# Patient Record
Sex: Female | Born: 1935
Health system: Southern US, Community
[De-identification: ages and names within clinical notes are randomized; demographics above are authoritative.]

## PROBLEM LIST (undated history)

## (undated) DIAGNOSIS — Z8543 Personal history of malignant neoplasm of ovary: Secondary | ICD-10-CM

## (undated) DIAGNOSIS — I10 Essential (primary) hypertension: Secondary | ICD-10-CM

## (undated) DIAGNOSIS — H409 Unspecified glaucoma: Secondary | ICD-10-CM

## (undated) DIAGNOSIS — Z923 Personal history of irradiation: Secondary | ICD-10-CM

## (undated) DIAGNOSIS — Z95828 Presence of other vascular implants and grafts: Secondary | ICD-10-CM

## (undated) DIAGNOSIS — Z87898 Personal history of other specified conditions: Secondary | ICD-10-CM

## (undated) DIAGNOSIS — C569 Malignant neoplasm of unspecified ovary: Secondary | ICD-10-CM

## (undated) DIAGNOSIS — E785 Hyperlipidemia, unspecified: Secondary | ICD-10-CM

## (undated) DIAGNOSIS — Z803 Family history of malignant neoplasm of breast: Secondary | ICD-10-CM

## (undated) DIAGNOSIS — N6019 Diffuse cystic mastopathy of unspecified breast: Secondary | ICD-10-CM

## (undated) DIAGNOSIS — Z86010 Personal history of colonic polyps: Secondary | ICD-10-CM

## (undated) DIAGNOSIS — Z8042 Family history of malignant neoplasm of prostate: Secondary | ICD-10-CM

## (undated) DIAGNOSIS — Z8041 Family history of malignant neoplasm of ovary: Secondary | ICD-10-CM

## (undated) DIAGNOSIS — K5732 Diverticulitis of large intestine without perforation or abscess without bleeding: Secondary | ICD-10-CM

## (undated) DIAGNOSIS — A159 Respiratory tuberculosis unspecified: Secondary | ICD-10-CM

## (undated) DIAGNOSIS — C50919 Malignant neoplasm of unspecified site of unspecified female breast: Secondary | ICD-10-CM

## (undated) DIAGNOSIS — Z8049 Family history of malignant neoplasm of other genital organs: Secondary | ICD-10-CM

## (undated) DIAGNOSIS — Z9221 Personal history of antineoplastic chemotherapy: Secondary | ICD-10-CM

## (undated) DIAGNOSIS — N809 Endometriosis, unspecified: Secondary | ICD-10-CM

## (undated) DIAGNOSIS — Z8619 Personal history of other infectious and parasitic diseases: Secondary | ICD-10-CM

## (undated) DIAGNOSIS — J329 Chronic sinusitis, unspecified: Secondary | ICD-10-CM

## (undated) HISTORY — DX: Family history of malignant neoplasm of breast: Z80.3

## (undated) HISTORY — DX: Personal history of other infectious and parasitic diseases: Z86.19

## (undated) HISTORY — DX: Family history of malignant neoplasm of other genital organs: Z80.49

## (undated) HISTORY — DX: Personal history of other specified conditions: Z87.898

## (undated) HISTORY — DX: Essential (primary) hypertension: I10

## (undated) HISTORY — PX: OTHER SURGICAL HISTORY: SHX169

## (undated) HISTORY — PX: OOPHORECTOMY: SHX86

## (undated) HISTORY — DX: Personal history of malignant neoplasm of ovary: Z85.43

## (undated) HISTORY — PX: COLONOSCOPY: SHX174

## (undated) HISTORY — DX: Hyperlipidemia, unspecified: E78.5

## (undated) HISTORY — DX: Diffuse cystic mastopathy of unspecified breast: N60.19

## (undated) HISTORY — DX: Family history of malignant neoplasm of prostate: Z80.42

## (undated) HISTORY — PX: NASAL SINUS SURGERY: SHX719

## (undated) HISTORY — DX: Respiratory tuberculosis unspecified: A15.9

## (undated) HISTORY — DX: Family history of malignant neoplasm of ovary: Z80.41

## (undated) HISTORY — DX: Presence of other vascular implants and grafts: Z95.828

## (undated) HISTORY — DX: Chronic sinusitis, unspecified: J32.9

## (undated) HISTORY — DX: Diverticulitis of large intestine without perforation or abscess without bleeding: K57.32

## (undated) HISTORY — DX: Unspecified glaucoma: H40.9

## (undated) HISTORY — DX: Personal history of colonic polyps: Z86.010

## (undated) HISTORY — DX: Endometriosis, unspecified: N80.9

---
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
## 1980-06-11 HISTORY — PX: BREAST SURGERY: SHX581

## 1991-06-12 HISTORY — PX: BREAST EXCISIONAL BIOPSY: SUR124

## 2000-06-11 DIAGNOSIS — C569 Malignant neoplasm of unspecified ovary: Secondary | ICD-10-CM

## 2000-06-11 HISTORY — DX: Malignant neoplasm of unspecified ovary: C56.9

## 2001-06-11 HISTORY — PX: ABDOMINAL HYSTERECTOMY: SHX81

## 2001-06-11 HISTORY — PX: APPENDECTOMY: SHX54

## 2004-02-02 ENCOUNTER — Other Ambulatory Visit: Payer: Self-pay

## 2004-04-20 ENCOUNTER — Ambulatory Visit: Payer: Self-pay | Admitting: Unknown Physician Specialty

## 2004-04-27 ENCOUNTER — Ambulatory Visit: Payer: Self-pay | Admitting: Unknown Physician Specialty

## 2004-06-02 ENCOUNTER — Ambulatory Visit: Payer: Self-pay | Admitting: Unknown Physician Specialty

## 2004-07-18 ENCOUNTER — Ambulatory Visit: Payer: Self-pay | Admitting: Unknown Physician Specialty

## 2004-10-26 ENCOUNTER — Ambulatory Visit: Payer: Self-pay | Admitting: Unknown Physician Specialty

## 2005-05-30 ENCOUNTER — Ambulatory Visit: Payer: Self-pay | Admitting: Unknown Physician Specialty

## 2006-07-18 ENCOUNTER — Ambulatory Visit: Payer: Self-pay | Admitting: Unknown Physician Specialty

## 2007-07-24 ENCOUNTER — Ambulatory Visit: Payer: Self-pay | Admitting: Unknown Physician Specialty

## 2008-08-10 ENCOUNTER — Ambulatory Visit: Payer: Self-pay | Admitting: Unknown Physician Specialty

## 2009-10-20 ENCOUNTER — Ambulatory Visit: Payer: Self-pay | Admitting: Unknown Physician Specialty

## 2009-11-02 ENCOUNTER — Ambulatory Visit: Payer: Self-pay | Admitting: Unknown Physician Specialty

## 2010-04-24 ENCOUNTER — Ambulatory Visit: Payer: Self-pay | Admitting: Unknown Physician Specialty

## 2010-10-26 ENCOUNTER — Ambulatory Visit: Payer: Self-pay | Admitting: Unknown Physician Specialty

## 2011-11-06 ENCOUNTER — Ambulatory Visit: Payer: Self-pay | Admitting: Family Medicine

## 2012-05-14 ENCOUNTER — Inpatient Hospital Stay: Payer: Self-pay | Admitting: Surgery

## 2012-05-14 ENCOUNTER — Ambulatory Visit: Payer: Self-pay | Admitting: Unknown Physician Specialty

## 2012-05-14 LAB — COMPREHENSIVE METABOLIC PANEL
Albumin: 2.9 g/dL — ABNORMAL LOW (ref 3.4–5.0)
Alkaline Phosphatase: 130 U/L (ref 50–136)
Anion Gap: 6 — ABNORMAL LOW (ref 7–16)
Bilirubin,Total: 0.3 mg/dL (ref 0.2–1.0)
Calcium, Total: 8.7 mg/dL (ref 8.5–10.1)
Co2: 27 mmol/L (ref 21–32)
Creatinine: 0.68 mg/dL (ref 0.60–1.30)
Glucose: 102 mg/dL — ABNORMAL HIGH (ref 65–99)
Osmolality: 271 (ref 275–301)
SGOT(AST): 22 U/L (ref 15–37)
SGPT (ALT): 22 U/L (ref 12–78)

## 2012-05-14 LAB — CBC WITH DIFFERENTIAL/PLATELET
Basophil #: 0 10*3/uL (ref 0.0–0.1)
Eosinophil #: 0.1 10*3/uL (ref 0.0–0.7)
HCT: 33.7 % — ABNORMAL LOW (ref 35.0–47.0)
Lymphocyte #: 2.8 10*3/uL (ref 1.0–3.6)
MCH: 31.6 pg (ref 26.0–34.0)
MCV: 90 fL (ref 80–100)
Monocyte #: 1.9 x10 3/mm — ABNORMAL HIGH (ref 0.2–0.9)
Monocyte %: 9.5 %
Platelet: 369 10*3/uL (ref 150–440)
RBC: 3.73 10*6/uL — ABNORMAL LOW (ref 3.80–5.20)
RDW: 13.6 % (ref 11.5–14.5)
WBC: 19.9 10*3/uL — ABNORMAL HIGH (ref 3.6–11.0)

## 2012-05-14 LAB — URINALYSIS, COMPLETE
Bilirubin,UR: NEGATIVE
Glucose,UR: NEGATIVE mg/dL (ref 0–75)
Ketone: NEGATIVE
Leukocyte Esterase: NEGATIVE
Specific Gravity: 1.053 (ref 1.003–1.030)
WBC UR: <1 /HPF (ref 0–5)

## 2012-05-15 LAB — CBC WITH DIFFERENTIAL/PLATELET
Basophil #: 0.1 10*3/uL (ref 0.0–0.1)
Basophil #: 0.1 10*3/uL (ref 0.0–0.1)
Eosinophil #: 0.1 10*3/uL (ref 0.0–0.7)
Eosinophil %: 0.3 %
HCT: 32 % — ABNORMAL LOW (ref 35.0–47.0)
HGB: 11.4 g/dL — ABNORMAL LOW (ref 12.0–16.0)
Lymphocyte #: 2 10*3/uL (ref 1.0–3.6)
Lymphocyte #: 2.7 10*3/uL (ref 1.0–3.6)
Lymphocyte %: 10.5 %
MCH: 30.7 pg (ref 26.0–34.0)
MCH: 30.6 pg (ref 26.0–34.0)
MCHC: 33.9 g/dL (ref 32.0–36.0)
MCV: 91 fL (ref 80–100)
Monocyte #: 1.8 x10 3/mm — ABNORMAL HIGH (ref 0.2–0.9)
Monocyte #: 2.1 x10 3/mm — ABNORMAL HIGH (ref 0.2–0.9)
Monocyte %: 10.2 %
Monocyte %: 8.4 %
Neutrophil #: 14.1 10*3/uL — ABNORMAL HIGH (ref 1.4–6.5)
Neutrophil %: 78.1 %
Neutrophil %: 80.4 %
Platelet: 330 10*3/uL (ref 150–440)
Platelet: 374 10*3/uL (ref 150–440)
RBC: 3.54 10*6/uL — ABNORMAL LOW (ref 3.80–5.20)
RBC: 3.74 10*6/uL — ABNORMAL LOW (ref 3.80–5.20)
RDW: 13.5 % (ref 11.5–14.5)
RDW: 14.1 % (ref 11.5–14.5)
WBC: 18 10*3/uL — ABNORMAL HIGH (ref 3.6–11.0)
WBC: 25.3 10*3/uL — ABNORMAL HIGH (ref 3.6–11.0)

## 2012-05-15 LAB — BASIC METABOLIC PANEL
BUN: 7 mg/dL (ref 7–18)
Calcium, Total: 8.3 mg/dL — ABNORMAL LOW (ref 8.5–10.1)
Co2: 25 mmol/L (ref 21–32)
EGFR (African American): >60
EGFR (Non-African Amer.): >60
Glucose: 96 mg/dL (ref 65–99)
Osmolality: 277 (ref 275–301)
Potassium: 3.7 mmol/L (ref 3.5–5.1)
Sodium: 140 mmol/L (ref 136–145)

## 2012-05-16 LAB — CBC WITH DIFFERENTIAL/PLATELET
Eosinophil #: 0.1 10*3/uL (ref 0.0–0.7)
HGB: 11 g/dL — ABNORMAL LOW (ref 12.0–16.0)
Lymphocyte #: 3.1 10*3/uL (ref 1.0–3.6)
MCH: 29.5 pg (ref 26.0–34.0)
MCV: 90 fL (ref 80–100)
Monocyte #: 1.8 x10 3/mm — ABNORMAL HIGH (ref 0.2–0.9)
Monocyte %: 8 %
Neutrophil %: 76.8 %
Platelet: 409 10*3/uL (ref 150–440)
RBC: 3.74 10*6/uL — ABNORMAL LOW (ref 3.80–5.20)
RDW: 13.9 % (ref 11.5–14.5)
WBC: 21.9 10*3/uL — ABNORMAL HIGH (ref 3.6–11.0)

## 2012-05-17 LAB — CBC WITH DIFFERENTIAL/PLATELET
Basophil %: 0.8 %
Eosinophil #: 0.1 10*3/uL (ref 0.0–0.7)
Eosinophil %: 0.7 %
HGB: 10.6 g/dL — ABNORMAL LOW (ref 12.0–16.0)
Lymphocyte #: 2.5 10*3/uL (ref 1.0–3.6)
Lymphocyte %: 12.4 %
MCH: 30.8 pg (ref 26.0–34.0)
MCHC: 34.3 g/dL (ref 32.0–36.0)
MCV: 90 fL (ref 80–100)
Monocyte #: 1.9 x10 3/mm — ABNORMAL HIGH (ref 0.2–0.9)
Neutrophil #: 15.5 10*3/uL — ABNORMAL HIGH (ref 1.4–6.5)
RDW: 14 % (ref 11.5–14.5)

## 2012-05-18 LAB — CBC WITH DIFFERENTIAL/PLATELET
Eosinophil %: 1 %
HCT: 31.5 % — ABNORMAL LOW (ref 35.0–47.0)
HGB: 10.5 g/dL — ABNORMAL LOW (ref 12.0–16.0)
Lymphocyte #: 2.6 10*3/uL (ref 1.0–3.6)
MCHC: 33.2 g/dL (ref 32.0–36.0)
MCV: 90 fL (ref 80–100)
Monocyte #: 1.3 x10 3/mm — ABNORMAL HIGH (ref 0.2–0.9)
Monocyte %: 7.4 %
Neutrophil #: 13.2 10*3/uL — ABNORMAL HIGH (ref 1.4–6.5)
Platelet: 402 10*3/uL (ref 150–440)
RBC: 3.51 10*6/uL — ABNORMAL LOW (ref 3.80–5.20)
WBC: 17.3 10*3/uL — ABNORMAL HIGH (ref 3.6–11.0)

## 2012-05-19 LAB — CBC WITH DIFFERENTIAL/PLATELET
Basophil #: 0.1 10*3/uL (ref 0.0–0.1)
Basophil %: 0.3 %
Eosinophil #: 0.4 10*3/uL (ref 0.0–0.7)
HCT: 32.9 % — ABNORMAL LOW (ref 35.0–47.0)
Lymphocyte %: 18.6 %
MCH: 32.5 pg (ref 26.0–34.0)
MCHC: 36 g/dL (ref 32.0–36.0)
MCV: 91 fL (ref 80–100)
Monocyte #: 1.5 x10 3/mm — ABNORMAL HIGH (ref 0.2–0.9)
Neutrophil #: 13 10*3/uL — ABNORMAL HIGH (ref 1.4–6.5)
Neutrophil %: 70.9 %
Platelet: 443 10*3/uL — ABNORMAL HIGH (ref 150–440)
RBC: 3.63 10*6/uL — ABNORMAL LOW (ref 3.80–5.20)

## 2012-05-20 LAB — CBC WITH DIFFERENTIAL/PLATELET
Basophil #: 0.1 10*3/uL (ref 0.0–0.1)
Basophil %: 0.5 %
Eosinophil #: 0.3 10*3/uL (ref 0.0–0.7)
HCT: 30.6 % — ABNORMAL LOW (ref 35.0–47.0)
HGB: 10.3 g/dL — ABNORMAL LOW (ref 12.0–16.0)
Lymphocyte %: 16.6 %
MCHC: 33.7 g/dL (ref 32.0–36.0)
MCV: 90 fL (ref 80–100)
Monocyte #: 1.4 x10 3/mm — ABNORMAL HIGH (ref 0.2–0.9)
Monocyte %: 8.3 %
Neutrophil %: 73.1 %
RDW: 13.9 % (ref 11.5–14.5)
WBC: 16.9 10*3/uL — ABNORMAL HIGH (ref 3.6–11.0)

## 2012-05-20 LAB — BASIC METABOLIC PANEL
Anion Gap: 8 (ref 7–16)
BUN: 10 mg/dL (ref 7–18)
Chloride: 107 mmol/L (ref 98–107)
Co2: 25 mmol/L (ref 21–32)
Creatinine: 0.74 mg/dL (ref 0.60–1.30)
EGFR (African American): >60
Glucose: 95 mg/dL (ref 65–99)
Sodium: 140 mmol/L (ref 136–145)

## 2012-05-20 LAB — URINALYSIS, COMPLETE
Bilirubin,UR: NEGATIVE
Glucose,UR: NEGATIVE mg/dL (ref 0–75)
Ketone: NEGATIVE
Nitrite: NEGATIVE
Ph: 7 (ref 4.5–8.0)
Squamous Epithelial: 2

## 2012-05-21 LAB — CBC WITH DIFFERENTIAL/PLATELET
Basophil #: 0.1 10*3/uL (ref 0.0–0.1)
Eosinophil #: 0.2 10*3/uL (ref 0.0–0.7)
HCT: 32 % — ABNORMAL LOW (ref 35.0–47.0)
Lymphocyte %: 16 %
MCH: 30.9 pg (ref 26.0–34.0)
MCV: 90 fL (ref 80–100)
Monocyte #: 1.5 x10 3/mm — ABNORMAL HIGH (ref 0.2–0.9)
Monocyte %: 8.6 %
Platelet: 495 10*3/uL — ABNORMAL HIGH (ref 150–440)
RDW: 13.9 % (ref 11.5–14.5)
WBC: 17 10*3/uL — ABNORMAL HIGH (ref 3.6–11.0)

## 2012-05-21 LAB — PROTIME-INR: INR: 1

## 2012-05-21 LAB — BASIC METABOLIC PANEL
Anion Gap: 6 — ABNORMAL LOW (ref 7–16)
BUN: 8 mg/dL (ref 7–18)
Creatinine: 0.76 mg/dL (ref 0.60–1.30)
EGFR (African American): >60
EGFR (Non-African Amer.): >60
Glucose: 97 mg/dL (ref 65–99)

## 2012-05-22 HISTORY — PX: OSTOMY: SHX5997

## 2012-05-22 LAB — CBC WITH DIFFERENTIAL/PLATELET
Basophil %: 0.3 %
Eosinophil #: 0.1 10*3/uL (ref 0.0–0.7)
HCT: 32.7 % — ABNORMAL LOW (ref 35.0–47.0)
HGB: 10.8 g/dL — ABNORMAL LOW (ref 12.0–16.0)
MCH: 29.7 pg (ref 26.0–34.0)
MCHC: 33.1 g/dL (ref 32.0–36.0)
Monocyte #: 1.5 x10 3/mm — ABNORMAL HIGH (ref 0.2–0.9)
Neutrophil #: 16.6 10*3/uL — ABNORMAL HIGH (ref 1.4–6.5)
Neutrophil %: 82.4 %
Platelet: 472 10*3/uL — ABNORMAL HIGH (ref 150–440)

## 2012-05-22 LAB — BASIC METABOLIC PANEL
Anion Gap: 7 (ref 7–16)
BUN: 7 mg/dL (ref 7–18)
Creatinine: 0.75 mg/dL (ref 0.60–1.30)
EGFR (Non-African Amer.): >60
Glucose: 106 mg/dL — ABNORMAL HIGH (ref 65–99)
Potassium: 3.7 mmol/L (ref 3.5–5.1)
Sodium: 140 mmol/L (ref 136–145)

## 2012-05-22 LAB — HEMOGLOBIN: HGB: 10.8 g/dL — ABNORMAL LOW (ref 12.0–16.0)

## 2012-05-23 LAB — BASIC METABOLIC PANEL
BUN: 7 mg/dL (ref 7–18)
Calcium, Total: 7.8 mg/dL — ABNORMAL LOW (ref 8.5–10.1)
Chloride: 109 mmol/L — ABNORMAL HIGH (ref 98–107)
Creatinine: 0.82 mg/dL (ref 0.60–1.30)
EGFR (Non-African Amer.): >60
Glucose: 182 mg/dL — ABNORMAL HIGH (ref 65–99)
Osmolality: 286 (ref 275–301)
Potassium: 3.7 mmol/L (ref 3.5–5.1)
Sodium: 142 mmol/L (ref 136–145)

## 2012-05-23 LAB — CBC WITH DIFFERENTIAL/PLATELET
Basophil #: 0 10*3/uL (ref 0.0–0.1)
Eosinophil #: 0 10*3/uL (ref 0.0–0.7)
Eosinophil %: 0 %
HGB: 10.7 g/dL — ABNORMAL LOW (ref 12.0–16.0)
Lymphocyte #: 1.8 10*3/uL (ref 1.0–3.6)
MCH: 29.9 pg (ref 26.0–34.0)
MCV: 91 fL (ref 80–100)
Monocyte #: 1.9 x10 3/mm — ABNORMAL HIGH (ref 0.2–0.9)
Monocyte %: 6.4 %
Neutrophil %: 87.3 %
Platelet: 530 10*3/uL — ABNORMAL HIGH (ref 150–440)
RBC: 3.57 10*6/uL — ABNORMAL LOW (ref 3.80–5.20)
RDW: 14 % (ref 11.5–14.5)
WBC: 29.4 10*3/uL — ABNORMAL HIGH (ref 3.6–11.0)

## 2012-05-24 LAB — BASIC METABOLIC PANEL
Anion Gap: 6 — ABNORMAL LOW (ref 7–16)
Chloride: 105 mmol/L (ref 98–107)
Co2: 27 mmol/L (ref 21–32)
Creatinine: 0.59 mg/dL — ABNORMAL LOW (ref 0.60–1.30)
Glucose: 127 mg/dL — ABNORMAL HIGH (ref 65–99)
Potassium: 3.8 mmol/L (ref 3.5–5.1)
Sodium: 138 mmol/L (ref 136–145)

## 2012-05-24 LAB — CBC WITH DIFFERENTIAL/PLATELET
Basophil #: 0.1 10*3/uL (ref 0.0–0.1)
Basophil %: 0.5 %
Eosinophil #: 0 10*3/uL (ref 0.0–0.7)
HCT: 27.7 % — ABNORMAL LOW (ref 35.0–47.0)
Lymphocyte #: 2.1 10*3/uL (ref 1.0–3.6)
Lymphocyte %: 8.5 %
MCH: 32.1 pg (ref 26.0–34.0)
MCV: 90 fL (ref 80–100)
Monocyte #: 2.3 x10 3/mm — ABNORMAL HIGH (ref 0.2–0.9)
Neutrophil #: 20.4 10*3/uL — ABNORMAL HIGH (ref 1.4–6.5)
Neutrophil %: 81.8 %
RBC: 3.09 10*6/uL — ABNORMAL LOW (ref 3.80–5.20)
RDW: 14.3 % (ref 11.5–14.5)
WBC: 24.9 10*3/uL — ABNORMAL HIGH (ref 3.6–11.0)

## 2012-05-25 LAB — CBC WITH DIFFERENTIAL/PLATELET
Basophil %: 0.3 %
Eosinophil %: 0.3 %
HCT: 28.1 % — ABNORMAL LOW (ref 35.0–47.0)
HGB: 10.3 g/dL — ABNORMAL LOW (ref 12.0–16.0)
Lymphocyte %: 8.7 %
MCH: 32.9 pg (ref 26.0–34.0)
MCV: 90 fL (ref 80–100)
Monocyte #: 1.7 x10 3/mm — ABNORMAL HIGH (ref 0.2–0.9)
Monocyte %: 8.1 %
Platelet: 468 10*3/uL — ABNORMAL HIGH (ref 150–440)
RBC: 3.13 10*6/uL — ABNORMAL LOW (ref 3.80–5.20)
WBC: 20.7 10*3/uL — ABNORMAL HIGH (ref 3.6–11.0)

## 2012-05-26 LAB — CBC WITH DIFFERENTIAL/PLATELET
Basophil #: 0.1 10*3/uL (ref 0.0–0.1)
Basophil %: 0.4 %
Eosinophil %: 0.8 %
HCT: 27.9 % — ABNORMAL LOW (ref 35.0–47.0)
HGB: 9.4 g/dL — ABNORMAL LOW (ref 12.0–16.0)
Lymphocyte #: 2.1 10*3/uL (ref 1.0–3.6)
MCH: 30.2 pg (ref 26.0–34.0)
MCV: 89 fL (ref 80–100)
Monocyte #: 1.2 x10 3/mm — ABNORMAL HIGH (ref 0.2–0.9)
Neutrophil #: 11.2 10*3/uL — ABNORMAL HIGH (ref 1.4–6.5)
Neutrophil %: 76.2 %
Platelet: 505 10*3/uL — ABNORMAL HIGH (ref 150–440)
RBC: 3.13 10*6/uL — ABNORMAL LOW (ref 3.80–5.20)

## 2012-05-26 LAB — WOUND CULTURE

## 2012-05-26 LAB — BASIC METABOLIC PANEL
BUN: 4 mg/dL — ABNORMAL LOW (ref 7–18)
Calcium, Total: 8.6 mg/dL (ref 8.5–10.1)
Chloride: 107 mmol/L (ref 98–107)
Co2: 28 mmol/L (ref 21–32)
Creatinine: 0.46 mg/dL — ABNORMAL LOW (ref 0.60–1.30)
EGFR (African American): >60
Osmolality: 278 (ref 275–301)
Potassium: 3.6 mmol/L (ref 3.5–5.1)

## 2012-05-26 LAB — PATHOLOGY REPORT

## 2012-05-27 LAB — CBC WITH DIFFERENTIAL/PLATELET
Basophil %: 0.7 %
Eosinophil #: 0.1 10*3/uL (ref 0.0–0.7)
Eosinophil %: 0.8 %
HGB: 10.1 g/dL — ABNORMAL LOW (ref 12.0–16.0)
Lymphocyte %: 17.3 %
MCHC: 36 g/dL (ref 32.0–36.0)
Monocyte %: 9 %
Neutrophil %: 72.2 %
RBC: 3.13 10*6/uL — ABNORMAL LOW (ref 3.80–5.20)
WBC: 15.2 10*3/uL — ABNORMAL HIGH (ref 3.6–11.0)

## 2012-05-28 LAB — CBC WITH DIFFERENTIAL/PLATELET
Eosinophil #: 0.2 10*3/uL (ref 0.0–0.7)
Lymphocyte #: 1.9 10*3/uL (ref 1.0–3.6)
MCH: 30.6 pg (ref 26.0–34.0)
MCHC: 34.2 g/dL (ref 32.0–36.0)
MCV: 90 fL (ref 80–100)
Monocyte #: 1.1 x10 3/mm — ABNORMAL HIGH (ref 0.2–0.9)
Monocyte %: 8.6 %
Neutrophil %: 74.2 %
Platelet: 485 10*3/uL — ABNORMAL HIGH (ref 150–440)
RDW: 14.1 % (ref 11.5–14.5)

## 2012-05-29 LAB — CBC WITH DIFFERENTIAL/PLATELET
Basophil #: 0.1 10*3/uL (ref 0.0–0.1)
Basophil %: 0.6 %
Eosinophil #: 0.3 10*3/uL (ref 0.0–0.7)
Eosinophil %: 2.2 %
HGB: 9.5 g/dL — ABNORMAL LOW (ref 12.0–16.0)
Lymphocyte #: 2.3 10*3/uL (ref 1.0–3.6)
MCH: 30 pg (ref 26.0–34.0)
MCHC: 33.7 g/dL (ref 32.0–36.0)
MCV: 89 fL (ref 80–100)
Monocyte #: 1.3 x10 3/mm — ABNORMAL HIGH (ref 0.2–0.9)
Neutrophil %: 72.3 %
Platelet: 570 10*3/uL — ABNORMAL HIGH (ref 150–440)
RBC: 3.15 10*6/uL — ABNORMAL LOW (ref 3.80–5.20)

## 2012-05-30 LAB — CBC WITH DIFFERENTIAL/PLATELET
Basophil #: 0.1 10*3/uL (ref 0.0–0.1)
Eosinophil #: 0.3 10*3/uL (ref 0.0–0.7)
HCT: 29.7 % — ABNORMAL LOW (ref 35.0–47.0)
Lymphocyte #: 1.7 10*3/uL (ref 1.0–3.6)
Lymphocyte %: 9.2 %
MCH: 29.4 pg (ref 26.0–34.0)
MCHC: 32.9 g/dL (ref 32.0–36.0)
MCV: 89 fL (ref 80–100)
Monocyte #: 1.4 x10 3/mm — ABNORMAL HIGH (ref 0.2–0.9)
Neutrophil #: 14.5 10*3/uL — ABNORMAL HIGH (ref 1.4–6.5)
RBC: 3.32 10*6/uL — ABNORMAL LOW (ref 3.80–5.20)
RDW: 14.7 % — ABNORMAL HIGH (ref 11.5–14.5)

## 2012-05-31 LAB — CBC WITH DIFFERENTIAL/PLATELET
Basophil #: 0.1 10*3/uL (ref 0.0–0.1)
Eosinophil #: 0.2 10*3/uL (ref 0.0–0.7)
Eosinophil %: 1.5 %
HCT: 27.3 % — ABNORMAL LOW (ref 35.0–47.0)
Lymphocyte #: 1.8 10*3/uL (ref 1.0–3.6)
MCH: 29.7 pg (ref 26.0–34.0)
MCHC: 33.2 g/dL (ref 32.0–36.0)
MCV: 90 fL (ref 80–100)
Monocyte #: 1.6 x10 3/mm — ABNORMAL HIGH (ref 0.2–0.9)
Neutrophil #: 9.6 10*3/uL — ABNORMAL HIGH (ref 1.4–6.5)
Neutrophil %: 72.2 %
Platelet: 571 10*3/uL — ABNORMAL HIGH (ref 150–440)
RBC: 3.05 10*6/uL — ABNORMAL LOW (ref 3.80–5.20)
RDW: 14.7 % — ABNORMAL HIGH (ref 11.5–14.5)
WBC: 13.2 10*3/uL — ABNORMAL HIGH (ref 3.6–11.0)

## 2012-05-31 LAB — COMPREHENSIVE METABOLIC PANEL
Alkaline Phosphatase: 177 U/L — ABNORMAL HIGH (ref 50–136)
BUN: 9 mg/dL (ref 7–18)
Bilirubin,Total: 0.3 mg/dL (ref 0.2–1.0)
Calcium, Total: 8.2 mg/dL — ABNORMAL LOW (ref 8.5–10.1)
Co2: 28 mmol/L (ref 21–32)
Creatinine: 0.84 mg/dL (ref 0.60–1.30)
EGFR (African American): >60
EGFR (Non-African Amer.): >60
Glucose: 131 mg/dL — ABNORMAL HIGH (ref 65–99)
Osmolality: 276 (ref 275–301)
SGPT (ALT): 50 U/L (ref 12–78)
Sodium: 138 mmol/L (ref 136–145)
Total Protein: 6.5 g/dL (ref 6.4–8.2)

## 2012-05-31 LAB — CHOLESTEROL, TOTAL: Cholesterol: 144 mg/dL (ref 0–200)

## 2012-05-31 LAB — TRIGLYCERIDES: Triglycerides: 178 mg/dL (ref 0–200)

## 2012-06-01 LAB — CBC WITH DIFFERENTIAL/PLATELET
Basophil %: 1.2 %
Eosinophil %: 1.6 %
HCT: 26.8 % — ABNORMAL LOW (ref 35.0–47.0)
HGB: 8.5 g/dL — ABNORMAL LOW (ref 12.0–16.0)
Lymphocyte #: 1.7 10*3/uL (ref 1.0–3.6)
Lymphocyte %: 12.4 %
MCH: 28.4 pg (ref 26.0–34.0)
MCV: 89 fL (ref 80–100)
Monocyte #: 1.7 x10 3/mm — ABNORMAL HIGH (ref 0.2–0.9)
Monocyte %: 11.8 %
Platelet: 551 10*3/uL — ABNORMAL HIGH (ref 150–440)
RBC: 3.01 10*6/uL — ABNORMAL LOW (ref 3.80–5.20)
WBC: 14 10*3/uL — ABNORMAL HIGH (ref 3.6–11.0)

## 2012-06-01 LAB — PHOSPHORUS: Phosphorus: 3.3 mg/dL (ref 2.5–4.9)

## 2012-06-01 LAB — CALCIUM: Calcium, Total: 8.5 mg/dL (ref 8.5–10.1)

## 2012-06-01 LAB — POTASSIUM: Potassium: 3.5 mmol/L (ref 3.5–5.1)

## 2012-06-01 LAB — SODIUM: Sodium: 141 mmol/L (ref 136–145)

## 2012-06-02 LAB — CBC WITH DIFFERENTIAL/PLATELET
Basophil %: 0.7 %
Eosinophil %: 2.4 %
HGB: 8.7 g/dL — ABNORMAL LOW (ref 12.0–16.0)
Lymphocyte #: 2.6 10*3/uL (ref 1.0–3.6)
MCH: 29.5 pg (ref 26.0–34.0)
MCV: 90 fL (ref 80–100)
Monocyte #: 1.5 x10 3/mm — ABNORMAL HIGH (ref 0.2–0.9)
Monocyte %: 12.8 %
Neutrophil #: 7.6 10*3/uL — ABNORMAL HIGH (ref 1.4–6.5)
Platelet: 552 10*3/uL — ABNORMAL HIGH (ref 150–440)
RBC: 2.95 10*6/uL — ABNORMAL LOW (ref 3.80–5.20)
WBC: 12.1 10*3/uL — ABNORMAL HIGH (ref 3.6–11.0)

## 2012-06-02 LAB — SODIUM: Sodium: 141 mmol/L (ref 136–145)

## 2012-06-02 LAB — POTASSIUM: Potassium: 3.6 mmol/L (ref 3.5–5.1)

## 2012-06-02 LAB — MAGNESIUM: Magnesium: 2 mg/dL

## 2012-06-03 LAB — MAGNESIUM: Magnesium: 2 mg/dL

## 2012-06-03 LAB — SODIUM: Sodium: 140 mmol/L (ref 136–145)

## 2012-06-03 LAB — POTASSIUM: Potassium: 3.5 mmol/L (ref 3.5–5.1)

## 2012-06-03 LAB — CALCIUM: Calcium, Total: 8.7 mg/dL (ref 8.5–10.1)

## 2012-06-04 LAB — CBC WITH DIFFERENTIAL/PLATELET
Basophil #: 0.1 10*3/uL (ref 0.0–0.1)
Lymphocyte #: 2.1 10*3/uL (ref 1.0–3.6)
MCH: 29.9 pg (ref 26.0–34.0)
MCV: 89 fL (ref 80–100)
Monocyte #: 1 x10 3/mm — ABNORMAL HIGH (ref 0.2–0.9)
Platelet: 516 10*3/uL — ABNORMAL HIGH (ref 150–440)
RDW: 14.2 % (ref 11.5–14.5)
WBC: 9.6 10*3/uL (ref 3.6–11.0)

## 2012-06-04 LAB — PHOSPHORUS: Phosphorus: 3.4 mg/dL (ref 2.5–4.9)

## 2012-06-04 LAB — MAGNESIUM: Magnesium: 1.9 mg/dL

## 2012-06-04 LAB — SODIUM: Sodium: 143 mmol/L (ref 136–145)

## 2012-06-05 LAB — CALCIUM: Calcium, Total: 8.5 mg/dL (ref 8.5–10.1)

## 2012-06-05 LAB — CBC WITH DIFFERENTIAL/PLATELET
Basophil #: 0.1 10*3/uL (ref 0.0–0.1)
Eosinophil #: 0.3 10*3/uL (ref 0.0–0.7)
HCT: 27.6 % — ABNORMAL LOW (ref 35.0–47.0)
Lymphocyte #: 2.5 10*3/uL (ref 1.0–3.6)
Lymphocyte %: 24.8 %
MCHC: 33.3 g/dL (ref 32.0–36.0)
MCV: 90 fL (ref 80–100)
Monocyte %: 10.5 %
Neutrophil #: 6.1 10*3/uL (ref 1.4–6.5)
Neutrophil %: 61 %
Platelet: 506 10*3/uL — ABNORMAL HIGH (ref 150–440)
RDW: 14.5 % (ref 11.5–14.5)
WBC: 10 10*3/uL (ref 3.6–11.0)

## 2012-06-05 LAB — PHOSPHORUS: Phosphorus: 2.9 mg/dL (ref 2.5–4.9)

## 2012-06-05 LAB — POTASSIUM: Potassium: 3.9 mmol/L (ref 3.5–5.1)

## 2012-06-05 LAB — MAGNESIUM: Magnesium: 2 mg/dL

## 2012-06-06 LAB — CBC WITH DIFFERENTIAL/PLATELET
Eosinophil %: 3.4 %
HCT: 28 % — ABNORMAL LOW (ref 35.0–47.0)
Lymphocyte #: 2.2 10*3/uL (ref 1.0–3.6)
MCV: 90 fL (ref 80–100)
Monocyte %: 11.9 %
Neutrophil #: 5.3 10*3/uL (ref 1.4–6.5)
Platelet: 496 10*3/uL — ABNORMAL HIGH (ref 150–440)
RBC: 3.1 10*6/uL — ABNORMAL LOW (ref 3.80–5.20)

## 2012-06-06 LAB — SODIUM: Sodium: 143 mmol/L (ref 136–145)

## 2012-06-06 LAB — POTASSIUM: Potassium: 3.7 mmol/L (ref 3.5–5.1)

## 2012-06-06 LAB — PHOSPHORUS: Phosphorus: 3.1 mg/dL (ref 2.5–4.9)

## 2012-06-06 LAB — CREATININE, SERUM
Creatinine: 0.59 mg/dL — ABNORMAL LOW (ref 0.60–1.30)
EGFR (African American): >60
EGFR (Non-African Amer.): >60

## 2012-06-06 LAB — CALCIUM: Calcium, Total: 8.6 mg/dL (ref 8.5–10.1)

## 2012-06-07 LAB — POTASSIUM: Potassium: 3.9 mmol/L (ref 3.5–5.1)

## 2012-06-07 LAB — SODIUM: Sodium: 142 mmol/L (ref 136–145)

## 2012-06-07 LAB — MAGNESIUM: Magnesium: 2.1 mg/dL

## 2012-06-07 LAB — PHOSPHORUS: Phosphorus: 4.1 mg/dL (ref 2.5–4.9)

## 2012-06-08 LAB — POTASSIUM: Potassium: 4.1 mmol/L (ref 3.5–5.1)

## 2012-06-08 LAB — SODIUM: Sodium: 140 mmol/L (ref 136–145)

## 2012-06-08 LAB — MAGNESIUM: Magnesium: 2.3 mg/dL

## 2012-06-08 LAB — PHOSPHORUS: Phosphorus: 4 mg/dL (ref 2.5–4.9)

## 2012-06-11 DIAGNOSIS — Z860100 Personal history of colon polyps, unspecified: Secondary | ICD-10-CM

## 2012-06-11 HISTORY — DX: Personal history of colon polyps, unspecified: Z86.0100

## 2012-06-11 HISTORY — DX: Personal history of colonic polyps: Z86.010

## 2012-06-11 HISTORY — PX: COLOSTOMY REVERSAL: SHX5782

## 2012-10-16 ENCOUNTER — Ambulatory Visit: Payer: Self-pay | Admitting: Unknown Physician Specialty

## 2012-10-21 ENCOUNTER — Encounter: Payer: Self-pay | Admitting: Internal Medicine

## 2012-10-21 ENCOUNTER — Ambulatory Visit (INDEPENDENT_AMBULATORY_CARE_PROVIDER_SITE_OTHER): Payer: Medicare Other | Admitting: Internal Medicine

## 2012-10-21 VITALS — BP 130/70 | HR 68 | Temp 98.7°F | Ht 63.25 in | Wt 177.0 lb

## 2012-10-21 DIAGNOSIS — K5732 Diverticulitis of large intestine without perforation or abscess without bleeding: Secondary | ICD-10-CM

## 2012-10-21 DIAGNOSIS — K5730 Diverticulosis of large intestine without perforation or abscess without bleeding: Secondary | ICD-10-CM

## 2012-10-21 DIAGNOSIS — Z860100 Personal history of colon polyps, unspecified: Secondary | ICD-10-CM

## 2012-10-21 DIAGNOSIS — Z1239 Encounter for other screening for malignant neoplasm of breast: Secondary | ICD-10-CM

## 2012-10-21 DIAGNOSIS — E7800 Pure hypercholesterolemia, unspecified: Secondary | ICD-10-CM

## 2012-10-21 DIAGNOSIS — K5792 Diverticulitis of intestine, part unspecified, without perforation or abscess without bleeding: Secondary | ICD-10-CM

## 2012-10-21 DIAGNOSIS — I10 Essential (primary) hypertension: Secondary | ICD-10-CM

## 2012-10-21 DIAGNOSIS — K5790 Diverticulosis of intestine, part unspecified, without perforation or abscess without bleeding: Secondary | ICD-10-CM

## 2012-11-03 ENCOUNTER — Encounter: Payer: Self-pay | Admitting: Internal Medicine

## 2012-11-03 DIAGNOSIS — E7800 Pure hypercholesterolemia, unspecified: Secondary | ICD-10-CM | POA: Insufficient documentation

## 2012-11-03 DIAGNOSIS — K5792 Diverticulitis of intestine, part unspecified, without perforation or abscess without bleeding: Secondary | ICD-10-CM | POA: Insufficient documentation

## 2012-11-03 DIAGNOSIS — Z8543 Personal history of malignant neoplasm of ovary: Secondary | ICD-10-CM | POA: Insufficient documentation

## 2012-11-03 DIAGNOSIS — Z86010 Personal history of colonic polyps: Secondary | ICD-10-CM | POA: Insufficient documentation

## 2012-11-03 DIAGNOSIS — R030 Elevated blood-pressure reading, without diagnosis of hypertension: Secondary | ICD-10-CM | POA: Insufficient documentation

## 2012-11-03 NOTE — Progress Notes (Signed)
Subjective:    Patient ID: [name] [name], female    DOB: [birth year], 79 y.o.   MRN: [id number]  HPI 79 year old female with past history of hypertension, hypercholesterolemia, diverticulitis s/p colectomy and ovarian cancer.  She comes in today to follow up on these issues as well as to establish care.  She had a complete hysterectomy (performed by Dr Logan Bores) for ovarian cancer.  S/p chemotherapy.  Has done well.  Has been released.  Had a diverticular flare 04/2012.  Was placed on oral abx as an outpatient.  Was evaluated by GI Fransico Setters).  Admitted.  Placed on IV abx.  No better.  Dr Katrinka Blazing performed the colectomy.   States she is doing well.  Planning for reversal 7/14.  No chest pain or tightness with increased activity or exertion.  Tries to stay active.  Is intolerant to statins.  Had muscle soreness.  The CoQ10 did not help.     Past Medical History  Diagnosis Date  . Hypertension   . Hyperlipidemia   . Endometriosis   . Fibrocystic breast disease   . Glaucoma   . Cancer     ovarian  . Sinusitis   . History of chicken pox   . Diverticulitis of sigmoid colon   . History of colon polyps   . Tuberculosis     positve skin test  . H/O urinary incontinence     Outpatient Encounter Prescriptions as of 10/21/2012  Medication Sig Dispense Refill  . Calcium Carbonate-Vit D-Min (CALCIUM 600 + MINERALS) 600-200 MG-UNIT TABS Take by mouth once. Calcium 600 + Minerals (calcium carbonate-vit d3-min) 600 mg calcium- 200 unit      . irbesartan-hydrochlorothiazide (AVALIDE) 300-12.5 MG per tablet Take 1 tablet by mouth daily.      . Multiple Vitamins-Calcium (ONE-A-DAY WOMENS FORMULA PO) Take by mouth daily.      . Coenzyme Q-10 100 MG capsule Take 100 mg by mouth. Take 1 or 2 capsules by mouth once a day      . Omega-3 Fatty Acids (FISH OIL CONCENTRATE) 1000 MG CAPS Take 1,000 mg by mouth once.       No facility-administered encounter medications on file as of 10/21/2012.    Review of  Systems Patient denies any headache, lightheadedness or dizziness.  No sinus or allergy symptoms.  No chest pain, tightness or palpitations.  No increased shortness of breath, cough or congestion.  No nausea or vomiting.  No abdominal pain or cramping.  No acid reflux.  Ostomy working well.  No bowel change, such as BRBPR or melana.  No urine change.        Objective:   Physical Exam Filed Vitals:   10/21/12 1358  BP: 130/70  Pulse: 68  Temp: 98.7 F (61.34 C)   79 year old female in no acute distress.   HEENT:  Nares- clear.  Oropharynx - without lesions. NECK:  Supple.  Nontender.  No audible bruit.  HEART:  Appears to be regular. LUNGS:  No crackles or wheezing audible.  Respirations even and unlabored.  RADIAL PULSE:  Equal bilaterally.  ABDOMEN:  Soft, nontender.  Bowel sounds present and normal.  No audible abdominal bruit.  Ostomy present.  Non tender.   EXTREMITIES:  No increased edema present.  DP pulses palpable and equal bilaterally.          Assessment & Plan:  HEALTH MAINTENANCE.  Will schedule a physical for next visit.  States had her last mammogram 11/06/11.  Schedule a f/u mammogram.  Colonoscopy 10/16/12.  Obtain records.    I spent 40 minutes with this pt and more than 50% of the time was spent in consultation regarding the above.

## 2012-11-03 NOTE — Assessment & Plan Note (Signed)
Has been followed at [facility].  Has done well.   

## 2012-11-03 NOTE — Assessment & Plan Note (Signed)
Required (what sounds like) a partial colectomy.  Has ostomy.  Seeing GI and Dr Katrinka Blazing. Planning for reversal in 7/14.  Just had a f/u colonoscopy 10/16/12.

## 2012-11-03 NOTE — Assessment & Plan Note (Signed)
Continue current medication regimen.  Check metabolic panel.  

## 2012-11-03 NOTE — Assessment & Plan Note (Signed)
Low cholesterol diet.  Intolerant of statins.   Check lipid panel.

## 2012-11-03 NOTE — Assessment & Plan Note (Signed)
Up to date with colonoscopy as outlined.    

## 2012-11-06 ENCOUNTER — Other Ambulatory Visit: Payer: Medicare Other

## 2012-11-07 ENCOUNTER — Ambulatory Visit: Payer: Self-pay | Admitting: Internal Medicine

## 2012-11-07 ENCOUNTER — Other Ambulatory Visit (INDEPENDENT_AMBULATORY_CARE_PROVIDER_SITE_OTHER): Payer: Medicare Other

## 2012-11-07 DIAGNOSIS — K5790 Diverticulosis of intestine, part unspecified, without perforation or abscess without bleeding: Secondary | ICD-10-CM

## 2012-11-07 DIAGNOSIS — E7800 Pure hypercholesterolemia, unspecified: Secondary | ICD-10-CM

## 2012-11-07 DIAGNOSIS — K5730 Diverticulosis of large intestine without perforation or abscess without bleeding: Secondary | ICD-10-CM

## 2012-11-07 DIAGNOSIS — I10 Essential (primary) hypertension: Secondary | ICD-10-CM

## 2012-11-07 LAB — BASIC METABOLIC PANEL
CO2: 27 mEq/L (ref 19–32)
Calcium: 9.2 mg/dL (ref 8.4–10.5)
Chloride: 107 mEq/L (ref 96–112)
Glucose, Bld: 90 mg/dL (ref 70–99)
Potassium: 4.4 mEq/L (ref 3.5–5.1)
Sodium: 140 mEq/L (ref 135–145)

## 2012-11-07 LAB — LIPID PANEL
HDL: 51.1 mg/dL (ref >39.00)
LDL Cholesterol: 107 mg/dL — ABNORMAL HIGH (ref 0–99)
Total CHOL/HDL Ratio: 4
Triglycerides: 161 mg/dL — ABNORMAL HIGH (ref 0.0–149.0)
VLDL: 32.2 mg/dL (ref 0.0–40.0)

## 2012-11-07 LAB — CBC WITH DIFFERENTIAL/PLATELET
Basophils Absolute: 0 10*3/uL (ref 0.0–0.1)
Eosinophils Relative: 0.7 % (ref 0.0–5.0)
HCT: 37.4 % (ref 36.0–46.0)
Lymphocytes Relative: 32.7 % (ref 12.0–46.0)
Lymphs Abs: 2.6 10*3/uL (ref 0.7–4.0)
Monocytes Relative: 8.2 % (ref 3.0–12.0)
Platelets: 277 10*3/uL (ref 150.0–400.0)
WBC: 8 10*3/uL (ref 4.5–10.5)

## 2012-11-07 LAB — HEPATIC FUNCTION PANEL
Albumin: 3.7 g/dL (ref 3.5–5.2)
Total Bilirubin: 0.7 mg/dL (ref 0.3–1.2)

## 2012-11-07 LAB — TSH: TSH: 1.41 u[IU]/mL (ref 0.35–5.50)

## 2012-11-09 ENCOUNTER — Encounter: Payer: Self-pay | Admitting: Internal Medicine

## 2012-11-21 ENCOUNTER — Telehealth: Payer: Self-pay | Admitting: *Deleted

## 2012-11-21 DIAGNOSIS — R7989 Other specified abnormal findings of blood chemistry: Secondary | ICD-10-CM

## 2012-11-21 DIAGNOSIS — R945 Abnormal results of liver function studies: Secondary | ICD-10-CM

## 2012-11-21 NOTE — Telephone Encounter (Signed)
Pt is coming in for labs Monday 06.16.2014 what labs and dx?

## 2012-11-23 NOTE — Telephone Encounter (Signed)
Ordered liver panel.

## 2012-11-24 ENCOUNTER — Other Ambulatory Visit (INDEPENDENT_AMBULATORY_CARE_PROVIDER_SITE_OTHER): Payer: Medicare Other

## 2012-11-24 ENCOUNTER — Encounter: Payer: Self-pay | Admitting: Internal Medicine

## 2012-11-24 DIAGNOSIS — R945 Abnormal results of liver function studies: Secondary | ICD-10-CM

## 2012-11-24 DIAGNOSIS — R7989 Other specified abnormal findings of blood chemistry: Secondary | ICD-10-CM

## 2012-11-24 LAB — HEPATIC FUNCTION PANEL
AST: 23 U/L (ref 0–37)
Alkaline Phosphatase: 85 U/L (ref 39–117)
Total Bilirubin: 0.6 mg/dL (ref 0.3–1.2)

## 2012-12-01 ENCOUNTER — Telehealth: Payer: Self-pay | Admitting: *Deleted

## 2012-12-01 NOTE — Telephone Encounter (Signed)
Dr. Esther Hardy & Katrinka Blazing office called wanting recent lab work of patient being scheduled for surgery soon . Labs faxed.

## 2012-12-02 ENCOUNTER — Ambulatory Visit: Payer: Self-pay | Admitting: Surgery

## 2012-12-09 ENCOUNTER — Inpatient Hospital Stay: Payer: Self-pay | Admitting: Surgery

## 2012-12-09 DIAGNOSIS — R079 Chest pain, unspecified: Secondary | ICD-10-CM

## 2012-12-09 LAB — TROPONIN I: Troponin-I: 0.03 ng/mL

## 2012-12-10 LAB — CBC WITH DIFFERENTIAL/PLATELET
Basophil %: 0.3 %
Eosinophil #: 0 10*3/uL (ref 0.0–0.7)
Eosinophil %: 0 %
HGB: 11.5 g/dL — ABNORMAL LOW (ref 12.0–16.0)
Lymphocyte #: 2.4 10*3/uL (ref 1.0–3.6)
Lymphocyte %: 18.9 %
MCHC: 33.9 g/dL (ref 32.0–36.0)
Monocyte #: 1.1 x10 3/mm — ABNORMAL HIGH (ref 0.2–0.9)
Neutrophil #: 9.1 10*3/uL — ABNORMAL HIGH (ref 1.4–6.5)
Neutrophil %: 72.3 %
Platelet: 250 10*3/uL (ref 150–440)
RDW: 13.7 % (ref 11.5–14.5)
WBC: 12.7 10*3/uL — ABNORMAL HIGH (ref 3.6–11.0)

## 2012-12-10 LAB — BASIC METABOLIC PANEL
Co2: 27 mmol/L (ref 21–32)
Creatinine: 0.8 mg/dL (ref 0.60–1.30)
EGFR (Non-African Amer.): >60
Glucose: 120 mg/dL — ABNORMAL HIGH (ref 65–99)
Osmolality: 279 (ref 275–301)
Potassium: 3.7 mmol/L (ref 3.5–5.1)
Sodium: 140 mmol/L (ref 136–145)

## 2012-12-10 LAB — TROPONIN I
Troponin-I: 0.03 ng/mL
Troponin-I: 0.02 ng/mL

## 2012-12-10 LAB — CK
CK, Total: 327 U/L — ABNORMAL HIGH (ref 21–215)
CK, Total: 474 U/L — ABNORMAL HIGH (ref 21–215)

## 2012-12-11 LAB — CBC WITH DIFFERENTIAL/PLATELET
Basophil #: 0 10*3/uL (ref 0.0–0.1)
Eosinophil #: 0.1 10*3/uL (ref 0.0–0.7)
HCT: 35.9 % (ref 35.0–47.0)
Lymphocyte %: 25.5 %
MCV: 90 fL (ref 80–100)
Neutrophil #: 5.8 10*3/uL (ref 1.4–6.5)
Neutrophil %: 62.4 %
RBC: 3.99 10*6/uL (ref 3.80–5.20)
RDW: 13.9 % (ref 11.5–14.5)

## 2012-12-11 LAB — BASIC METABOLIC PANEL
Anion Gap: 7 (ref 7–16)
BUN: 5 mg/dL — ABNORMAL LOW (ref 7–18)
Calcium, Total: 8.8 mg/dL (ref 8.5–10.1)
Chloride: 107 mmol/L (ref 98–107)
EGFR (African American): >60
Sodium: 142 mmol/L (ref 136–145)

## 2012-12-25 ENCOUNTER — Encounter: Payer: Self-pay | Admitting: Internal Medicine

## 2013-01-13 ENCOUNTER — Ambulatory Visit (INDEPENDENT_AMBULATORY_CARE_PROVIDER_SITE_OTHER): Payer: Medicare Other | Admitting: Internal Medicine

## 2013-01-13 ENCOUNTER — Encounter: Payer: Self-pay | Admitting: Internal Medicine

## 2013-01-13 VITALS — BP 120/70 | HR 67 | Temp 98.6°F | Ht 63.25 in | Wt 180.0 lb

## 2013-01-13 DIAGNOSIS — R7989 Other specified abnormal findings of blood chemistry: Secondary | ICD-10-CM

## 2013-01-13 DIAGNOSIS — E7800 Pure hypercholesterolemia, unspecified: Secondary | ICD-10-CM

## 2013-01-13 DIAGNOSIS — K5792 Diverticulitis of intestine, part unspecified, without perforation or abscess without bleeding: Secondary | ICD-10-CM

## 2013-01-13 DIAGNOSIS — I10 Essential (primary) hypertension: Secondary | ICD-10-CM

## 2013-01-13 DIAGNOSIS — Z86010 Personal history of colonic polyps: Secondary | ICD-10-CM

## 2013-01-13 DIAGNOSIS — R945 Abnormal results of liver function studies: Secondary | ICD-10-CM

## 2013-01-13 DIAGNOSIS — K5732 Diverticulitis of large intestine without perforation or abscess without bleeding: Secondary | ICD-10-CM

## 2013-01-14 ENCOUNTER — Encounter: Payer: Self-pay | Admitting: Internal Medicine

## 2013-01-14 ENCOUNTER — Other Ambulatory Visit: Payer: Self-pay

## 2013-01-14 NOTE — Assessment & Plan Note (Signed)
Continue current medication regimen.  Follow metabolic panel.   

## 2013-01-14 NOTE — Assessment & Plan Note (Signed)
Required (what sounds like) a partial colectomy.  Has ostomy.  Seeing GI and Dr Katrinka Blazing. Was planning for reversal in 7/14.  Just had a f/u colonoscopy 10/16/12.  Dr Katrinka Blazing was unable to perform the reversal secondary to scar tissue.  Is scheduled for a second opinion at [facility] (in two weeks).  Follow.

## 2013-01-14 NOTE — Assessment & Plan Note (Signed)
Low cholesterol diet.  Intolerant of statins.   Follow lipid panel.  Cholesterol 11/07/12 revealed total cholesterol 190, triglycerides 161, HDL 51 and LDL 107.

## 2013-01-14 NOTE — Progress Notes (Signed)
Subjective:    Patient ID: [name] [name], female    DOB: June 06, 1934, 79 y.o.   MRN: [id number]  HPI 79 year old female with past history of hypertension, hypercholesterolemia, diverticulitis s/p colectomy and ovarian cancer.  She comes in today to follow up on these issues as well as for a complete physical exam.  She had a complete hysterectomy (performed by Dr Logan Bores) for ovarian cancer.  S/p chemotherapy.  Has done well.  Has been released.  Had a diverticular flare 04/2012.  Was placed on oral abx as an outpatient.  Was evaluated by GI Fransico Setters).  Admitted.  Placed on IV abx.  No better.  Dr Katrinka Blazing performed the colectomy.   Was planning for reversal 7/14.  Was unable to perform the reversal.  She is planning to have a second opinion.  Has an appt with surgery at [facility] in two weeks. States overall she is doing relatively well.  No chest pain or tightness with increased activity or exertion.  Tries to stay active. Ostomy working well.  Is intolerant to statins.  Had muscle soreness.      Past Medical History  Diagnosis Date  . Hypertension   . Hyperlipidemia   . Endometriosis   . Fibrocystic breast disease   . Glaucoma   . Cancer     ovarian  . Sinusitis   . History of chicken pox   . Diverticulitis of sigmoid colon   . History of colon polyps   . Tuberculosis     positve skin test  . H/O urinary incontinence     Outpatient Encounter Prescriptions as of 01/13/2013  Medication Sig Dispense Refill  . Calcium Carbonate-Vit D-Min (CALCIUM 600 + MINERALS) 600-200 MG-UNIT TABS Take by mouth once. Calcium 600 + Minerals (calcium carbonate-vit d3-min) 600 mg calcium- 200 unit      . irbesartan-hydrochlorothiazide (AVALIDE) 300-12.5 MG per tablet Take 1 tablet by mouth daily.      . [DISCONTINUED] Coenzyme Q-10 100 MG capsule Take 100 mg by mouth. Take 1 or 2 capsules by mouth once a day      . [DISCONTINUED] Multiple Vitamins-Calcium (ONE-A-DAY WOMENS FORMULA PO) Take by mouth daily.      .  [DISCONTINUED] Omega-3 Fatty Acids (FISH OIL CONCENTRATE) 1000 MG CAPS Take 1,000 mg by mouth once.       No facility-administered encounter medications on file as of 01/13/2013.    Review of Systems Patient denies any headache, lightheadedness or dizziness.  No sinus or allergy symptoms.  No chest pain, tightness or palpitations.  No increased shortness of breath, cough or congestion.  No nausea or vomiting.  No abdominal pain or cramping.  No acid reflux.  Ostomy working well.  No bowel change, such as BRBPR or melana.  No urine change.       Objective:   Physical Exam  Filed Vitals:   01/13/13 1032  BP: 120/70  Pulse: 67  Temp: 98.6 F (80 C)   79 year old female in no acute distress.   HEENT:  Nares- clear.  Oropharynx - without lesions. NECK:  Supple.  Nontender.  No audible bruit.  HEART:  Appears to be regular. LUNGS:  No crackles or wheezing audible.  Respirations even and unlabored.  RADIAL PULSE:  Equal bilaterally.    BREASTS:  No nipple discharge or nipple retraction present.  Could not appreciate any distinct nodules or axillary adenopathy.  ABDOMEN:  Soft, nontender.  Bowel sounds present and normal.  No audible  abdominal bruit.  No erythematous changes or tenderness around the ostomy.  Incision site healing well.   GU: not performed.   EXTREMITIES:  No increased edema present.  DP pulses palpable and equal bilaterally.          Assessment & Plan:  HEALTH MAINTENANCE.  Physical today.   Mammogram 11/07/12.  Colonoscopy 10/16/12.

## 2013-01-14 NOTE — Assessment & Plan Note (Signed)
Up to date with colonoscopy as outlined.    

## 2013-01-14 NOTE — Assessment & Plan Note (Signed)
Has been followed at [facility].  Has done well.   

## 2013-01-23 ENCOUNTER — Encounter: Payer: Self-pay | Admitting: Family Medicine

## 2013-01-23 ENCOUNTER — Encounter: Payer: Self-pay | Admitting: Internal Medicine

## 2013-01-28 ENCOUNTER — Other Ambulatory Visit: Payer: Self-pay | Admitting: *Deleted

## 2013-01-28 MED ORDER — IRBESARTAN-HYDROCHLOROTHIAZIDE 300-12.5 MG PO TABS: 1.0000 | ORAL_TABLET | Freq: Every day | ORAL | Status: DC

## 2013-01-30 ENCOUNTER — Ambulatory Visit: Payer: Self-pay

## 2013-01-30 ENCOUNTER — Telehealth: Payer: Self-pay | Admitting: Internal Medicine

## 2013-01-30 ENCOUNTER — Other Ambulatory Visit: Payer: Self-pay | Admitting: *Deleted

## 2013-01-30 MED ORDER — IRBESARTAN-HYDROCHLOROTHIAZIDE 300-12.5 MG PO TABS: 1.0000 | ORAL_TABLET | Freq: Every day | ORAL | Status: DC

## 2013-01-30 NOTE — Telephone Encounter (Signed)
The patient went to pick up her medication and found out that it was only for 30 days so she left it at the drug store she is wanting a refill for 90 days.   irbesartan-hydrochlorothiazide (AVALIDE) 300-12.5 MG per tablet

## 2013-01-30 NOTE — Telephone Encounter (Signed)
Rx updated & resent for 90 day (was not specified on refill request)

## 2013-04-16 ENCOUNTER — Other Ambulatory Visit: Payer: Self-pay

## 2013-05-14 ENCOUNTER — Other Ambulatory Visit (INDEPENDENT_AMBULATORY_CARE_PROVIDER_SITE_OTHER): Payer: Medicare Other

## 2013-05-14 DIAGNOSIS — I10 Essential (primary) hypertension: Secondary | ICD-10-CM

## 2013-05-14 DIAGNOSIS — E7800 Pure hypercholesterolemia, unspecified: Secondary | ICD-10-CM

## 2013-05-14 DIAGNOSIS — R7989 Other specified abnormal findings of blood chemistry: Secondary | ICD-10-CM

## 2013-05-14 DIAGNOSIS — R945 Abnormal results of liver function studies: Secondary | ICD-10-CM

## 2013-05-14 LAB — LIPID PANEL
Cholesterol: 171 mg/dL (ref 0–200)
VLDL: 31.2 mg/dL (ref 0.0–40.0)

## 2013-05-14 LAB — BASIC METABOLIC PANEL
BUN: 13 mg/dL (ref 6–23)
Calcium: 9.5 mg/dL (ref 8.4–10.5)
GFR: 78.4 mL/min (ref >60.00)
Potassium: 4.6 mEq/L (ref 3.5–5.1)
Sodium: 142 mEq/L (ref 135–145)

## 2013-05-14 LAB — HEPATIC FUNCTION PANEL
ALT: 13 U/L (ref 0–35)
AST: 17 U/L (ref 0–37)
Bilirubin, Direct: 0.1 mg/dL (ref 0.0–0.3)
Total Protein: 7.7 g/dL (ref 6.0–8.3)

## 2013-05-15 ENCOUNTER — Encounter: Payer: Self-pay | Admitting: Internal Medicine

## 2013-05-18 NOTE — Telephone Encounter (Signed)
Mailed unread message to pt  

## 2013-05-19 ENCOUNTER — Encounter: Payer: Self-pay | Admitting: Internal Medicine

## 2013-05-19 ENCOUNTER — Ambulatory Visit (INDEPENDENT_AMBULATORY_CARE_PROVIDER_SITE_OTHER): Payer: Medicare Other | Admitting: Internal Medicine

## 2013-05-19 VITALS — BP 130/62 | HR 77 | Temp 98.5°F | Resp 12 | Ht 63.25 in | Wt 172.0 lb

## 2013-05-19 DIAGNOSIS — K5732 Diverticulitis of large intestine without perforation or abscess without bleeding: Secondary | ICD-10-CM

## 2013-05-19 DIAGNOSIS — I10 Essential (primary) hypertension: Secondary | ICD-10-CM

## 2013-05-19 DIAGNOSIS — E7800 Pure hypercholesterolemia, unspecified: Secondary | ICD-10-CM

## 2013-05-19 DIAGNOSIS — K5792 Diverticulitis of intestine, part unspecified, without perforation or abscess without bleeding: Secondary | ICD-10-CM

## 2013-05-19 DIAGNOSIS — C569 Malignant neoplasm of unspecified ovary: Secondary | ICD-10-CM

## 2013-05-19 NOTE — Progress Notes (Signed)
  Subjective:    Patient ID: [name] [name], female    DOB: [birth year], 79 y.o.   MRN: [id number]  HPI 79 year old female with past history of hypertension, hypercholesterolemia, diverticulitis s/p colectomy and ovarian cancer.  She comes in today for a scheduled follow up.  She had a complete hysterectomy (performed by Dr Logan Bores) for ovarian cancer.  S/p chemotherapy.  Has done well.  Has been released.  Had a diverticular flare 04/2012.  Was placed on oral abx as an outpatient.  Was evaluated by GI Fransico Setters).  Admitted.  Placed on IV abx.  No better.  Dr Katrinka Blazing performed the colectomy.  Had reversal 03/19/13.  Has done well.  Has been released by surgery.  Bowels doing well.  States overall she is doing relatively well.  No chest pain or tightness with increased activity or exertion.  Tries to stay active.  Is intolerant to statins.  Had muscle soreness.      Past Medical History  Diagnosis Date  . Hypertension   . Hyperlipidemia   . Endometriosis   . Fibrocystic breast disease   . Glaucoma   . Cancer     ovarian  . Sinusitis   . History of chicken pox   . Diverticulitis of sigmoid colon   . History of colon polyps   . Tuberculosis     positve skin test  . H/O urinary incontinence     Outpatient Encounter Prescriptions as of 05/19/2013  Medication Sig  . Calcium Carbonate-Vit D-Min (CALCIUM 600 + MINERALS) 600-200 MG-UNIT TABS Take by mouth once. Calcium 600 + Minerals (calcium carbonate-vit d3-min) 600 mg calcium- 200 unit  . irbesartan-hydrochlorothiazide (AVALIDE) 300-12.5 MG per tablet Take 1 tablet by mouth daily.    Review of Systems Patient denies any headache, lightheadedness or dizziness.  No sinus or allergy symptoms.  No chest pain, tightness or palpitations.  No increased shortness of breath, cough or congestion.  No nausea or vomiting.  No abdominal pain or cramping.  No acid reflux.  No bowel change, such as BRBPR or melana.  No urine change.  Has done well with her  recent surgery.  Had reverasal.       Objective:   Physical Exam  Filed Vitals:   05/19/13 1120  BP: 130/62  Pulse: 77  Temp: 98.5 F (36.9 C)  Resp: 61   79 year old female in no acute distress.   HEENT:  Nares- clear.  Oropharynx - without lesions. NECK:  Supple.  Nontender.  No audible bruit.  HEART:  Appears to be regular. LUNGS:  No crackles or wheezing audible.  Respirations even and unlabored.  RADIAL PULSE:  Equal bilaterally.   ABDOMEN:  Soft, nontender.  Bowel sounds present and normal.  No audible abdominal bruit.  No erythematous changes or tenderness around the ostomy.  Incision site healing well.  EXTREMITIES:  No increased edema present.  DP pulses palpable and equal bilaterally.          Assessment & Plan:  HEALTH MAINTENANCE.  Physical 01/13/13.  Mammogram 11/07/12.  Colonoscopy 10/16/12.

## 2013-05-19 NOTE — Progress Notes (Signed)
Pre visit review using our clinic review tool, if applicable. No additional management support is needed unless otherwise documented below in the visit note. 

## 2013-05-23 ENCOUNTER — Encounter: Payer: Self-pay | Admitting: Internal Medicine

## 2013-05-23 NOTE — Assessment & Plan Note (Signed)
Low cholesterol diet.  Intolerant of statins.   Follow lipid panel.  Cholesterol 05/14/13 revealed total cholesterol 171, triglycerides 156, HDL 47 and LDL 93.

## 2013-05-23 NOTE — Assessment & Plan Note (Signed)
Required (what sounds like) a partial colectomy.  Has ostomy.  Seeing GI and Dr Katrinka Blazing. Was planning for reversal in 7/14.  Just had a f/u colonoscopy 10/16/12.  Is now s/p reversal (03/19/13).  Has done well.  Follow.

## 2013-05-23 NOTE — Assessment & Plan Note (Signed)
Continue current medication regimen.  Follow metabolic panel.   

## 2013-05-23 NOTE — Assessment & Plan Note (Signed)
Has been followed at [facility].  Has done well.   

## 2013-09-18 ENCOUNTER — Other Ambulatory Visit (INDEPENDENT_AMBULATORY_CARE_PROVIDER_SITE_OTHER): Payer: Medicare Other

## 2013-09-18 DIAGNOSIS — E7800 Pure hypercholesterolemia, unspecified: Secondary | ICD-10-CM

## 2013-09-18 DIAGNOSIS — I10 Essential (primary) hypertension: Secondary | ICD-10-CM

## 2013-09-18 LAB — COMPREHENSIVE METABOLIC PANEL
ALK PHOS: 113 U/L (ref 39–117)
ALT: 24 U/L (ref 0–35)
AST: 17 U/L (ref 0–37)
Albumin: 3.4 g/dL — ABNORMAL LOW (ref 3.5–5.2)
BILIRUBIN TOTAL: 0.5 mg/dL (ref 0.3–1.2)
BUN: 11 mg/dL (ref 6–23)
CO2: 30 mEq/L (ref 19–32)
Calcium: 9.4 mg/dL (ref 8.4–10.5)
Chloride: 106 mEq/L (ref 96–112)
Creatinine, Ser: 0.6 mg/dL (ref 0.4–1.2)
GFR: 95.51 mL/min (ref >60.00)
GLUCOSE: 89 mg/dL (ref 70–99)
Potassium: 4.2 mEq/L (ref 3.5–5.1)
Sodium: 142 mEq/L (ref 135–145)
Total Protein: 6.7 g/dL (ref 6.0–8.3)

## 2013-09-18 LAB — LIPID PANEL
CHOLESTEROL: 203 mg/dL — AB (ref 0–200)
HDL: 51.3 mg/dL (ref >39.00)
LDL Cholesterol: 119 mg/dL — ABNORMAL HIGH (ref 0–99)
Total CHOL/HDL Ratio: 4
Triglycerides: 164 mg/dL — ABNORMAL HIGH (ref 0.0–149.0)
VLDL: 32.8 mg/dL (ref 0.0–40.0)

## 2013-09-20 ENCOUNTER — Encounter: Payer: Self-pay | Admitting: Internal Medicine

## 2013-09-22 ENCOUNTER — Ambulatory Visit: Payer: Medicare Other | Admitting: Internal Medicine

## 2013-09-22 NOTE — Telephone Encounter (Signed)
Mailed unread message to pt  

## 2013-09-25 ENCOUNTER — Ambulatory Visit: Payer: Medicare Other | Admitting: Internal Medicine

## 2013-09-29 ENCOUNTER — Encounter: Payer: Self-pay | Admitting: Internal Medicine

## 2013-09-29 ENCOUNTER — Ambulatory Visit (INDEPENDENT_AMBULATORY_CARE_PROVIDER_SITE_OTHER): Payer: Medicare Other | Admitting: Internal Medicine

## 2013-09-29 VITALS — BP 130/70 | HR 65 | Temp 98.0°F | Ht 63.25 in | Wt 184.5 lb

## 2013-09-29 DIAGNOSIS — K5732 Diverticulitis of large intestine without perforation or abscess without bleeding: Secondary | ICD-10-CM

## 2013-09-29 DIAGNOSIS — Z860100 Personal history of colon polyps, unspecified: Secondary | ICD-10-CM

## 2013-09-29 DIAGNOSIS — IMO0001 Reserved for inherently not codable concepts without codable children: Secondary | ICD-10-CM

## 2013-09-29 DIAGNOSIS — K5792 Diverticulitis of intestine, part unspecified, without perforation or abscess without bleeding: Secondary | ICD-10-CM

## 2013-09-29 DIAGNOSIS — I10 Essential (primary) hypertension: Secondary | ICD-10-CM

## 2013-09-29 DIAGNOSIS — E7800 Pure hypercholesterolemia, unspecified: Secondary | ICD-10-CM

## 2013-09-29 DIAGNOSIS — C569 Malignant neoplasm of unspecified ovary: Secondary | ICD-10-CM

## 2013-09-29 DIAGNOSIS — R030 Elevated blood-pressure reading, without diagnosis of hypertension: Secondary | ICD-10-CM

## 2013-09-29 DIAGNOSIS — Z1239 Encounter for other screening for malignant neoplasm of breast: Secondary | ICD-10-CM

## 2013-09-29 NOTE — Assessment & Plan Note (Addendum)
Low cholesterol diet.  Intolerant of statins.   Follow lipid panel.  Cholesterol 09/18/13 revealed total cholesterol 20, triglycerides 164, HDL 51 and LDL 119.    

## 2013-09-29 NOTE — Assessment & Plan Note (Addendum)
Required (what sounds like) a partial colectomy.  Had an ostomy.  Seeing GI and Dr Smith.  Had a f/u colonoscopy 10/16/12.  Is now s/p reversal (03/19/13).  Has done well.  Follow.   

## 2013-09-29 NOTE — Assessment & Plan Note (Signed)
Has been followed at [facility].  Has done well.   

## 2013-09-29 NOTE — Progress Notes (Signed)
  Subjective:    Patient ID: [name] [name], female    DOB: Aug 30, 1933, 80 y.o.   MRN: [id number]  HPI 80 year old female with past history of hypertension, hypercholesterolemia, diverticulitis s/p colectomy and ovarian cancer.  She comes in today for a scheduled follow up.  She had a complete hysterectomy (performed by Dr Amalia Hailey) for ovarian cancer.  S/p chemotherapy.  Has done well.  Has been released.  Had a diverticular flare 04/2012.  Was placed on oral abx as an outpatient.  Was evaluated by GI Dawson Bills).  Admitted.  Placed on IV abx.  No better.  Dr Tamala Julian performed the colectomy.  Had reversal 03/19/13.  Has done well.  Has been released by surgery.  Bowels doing well.  States overall she is doing relatively well.  No chest pain or tightness with increased activity or exertion.  Tries to stay active.  Is intolerant to statins.  Had muscle soreness.      Past Medical History  Diagnosis Date  . Hypertension   . Hyperlipidemia   . Endometriosis   . Fibrocystic breast disease   . Glaucoma   . Cancer     ovarian  . Sinusitis   . History of chicken pox   . Diverticulitis of sigmoid colon   . History of colon polyps   . Tuberculosis     positve skin test  . H/O urinary incontinence     Outpatient Encounter Prescriptions as of 09/29/2013  Medication Sig  . Calcium Carbonate-Vit D-Min (CALCIUM 600 + MINERALS) 600-200 MG-UNIT TABS Take by mouth once. Calcium 600 + Minerals (calcium carbonate-vit d3-min) 600 mg calcium- 200 unit  . irbesartan-hydrochlorothiazide (AVALIDE) 300-12.5 MG per tablet Take 1 tablet by mouth daily.    Review of Systems Patient denies any headache, lightheadedness or dizziness.  No sinus or allergy symptoms.  No chest pain, tightness or palpitations.  No increased shortness of breath, cough or congestion.  No nausea or vomiting.  No abdominal pain or cramping.  No acid reflux.  No bowel change, such as BRBPR or melana.  No urine change.  Has done well with her  recent surgery.  Had reverasal.       Objective:   Physical Exam  Filed Vitals:   09/29/13 1053  BP: 130/70  Pulse: 65  Temp: 98 F (36.7 C)   Blood pressure: 48/30  80 year old female in no acute distress.   HEENT:  Nares- clear.  Oropharynx - without lesions. NECK:  Supple.  Nontender.  No audible bruit.  HEART:  Appears to be regular. LUNGS:  No crackles or wheezing audible.  Respirations even and unlabored.  RADIAL PULSE:  Equal bilaterally.   ABDOMEN:  Soft, nontender.  Bowel sounds present and normal.  No audible abdominal bruit.   EXTREMITIES:  No increased edema present.  DP pulses palpable and equal bilaterally.          Assessment & Plan:  HEALTH MAINTENANCE.  Physical 01/13/13.  Mammogram 11/07/12.  Schedule f/u mammogram.  Colonoscopy 10/16/12.

## 2013-09-29 NOTE — Assessment & Plan Note (Signed)
Continue current medication regimen.  Follow metabolic panel.

## 2013-09-29 NOTE — Assessment & Plan Note (Signed)
Up to date with colonoscopy as outlined.    

## 2013-09-29 NOTE — Progress Notes (Signed)
Pre visit review using our clinic review tool, if applicable. No additional management support is needed unless otherwise documented below in the visit note. 

## 2013-10-03 ENCOUNTER — Encounter: Payer: Self-pay | Admitting: Internal Medicine

## 2013-10-03 NOTE — Assessment & Plan Note (Signed)
Blood pressure as outlined.  Will have her spot check her pressure and record.  Get her back in soon to reassess.      

## 2013-10-07 IMAGING — CT CT GUIDED ABCESS DRAINAGE WITH CATHETER
2 of 5 series · 7 of 16 positions shown, 8 images · non-contrast
Comparison: none

REASON FOR EXAM: abscess LLQ
COMMENTS:

[Series 2: soft tissue · axial · 0.94mm/px · z∈[-693,-516]mm · 3 of 119 slices shown, 4 images]
[im 30/119  soft-tissue]
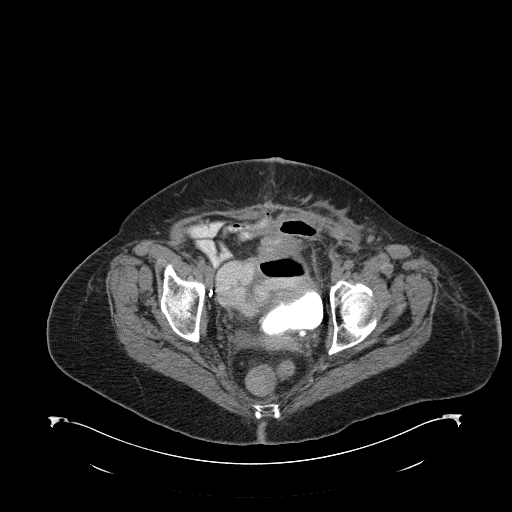
[im 30/119  bone]
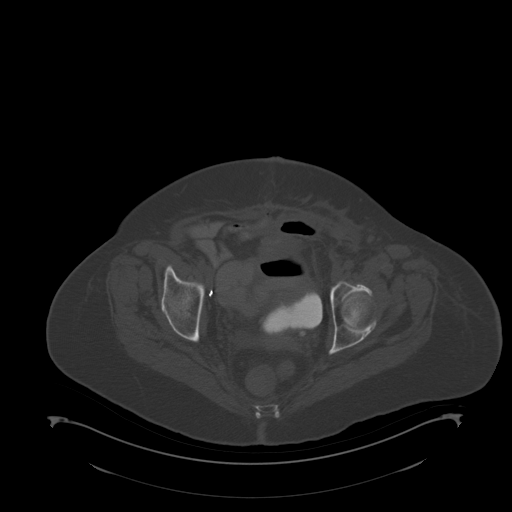
[im 60/119  soft-tissue]
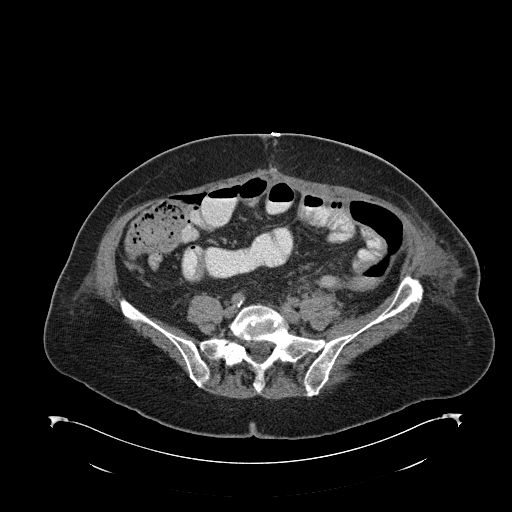
[im 89/119  soft-tissue]
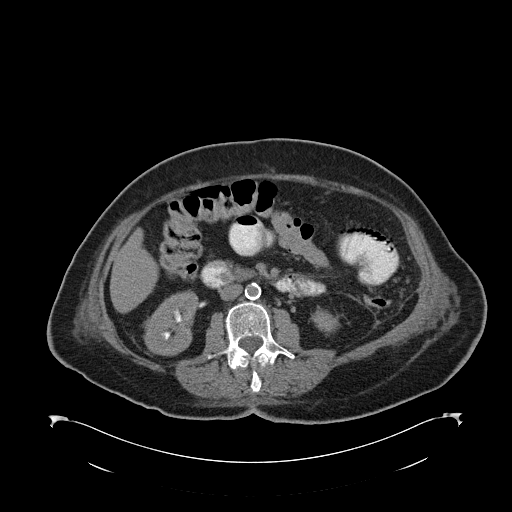

[Series 4: (hospital) 4.8 b30s · axial · 0.74mm/px · z∈[-598,-588]mm · 4 of 133 slices shown]
[im 27/133  soft-tissue]
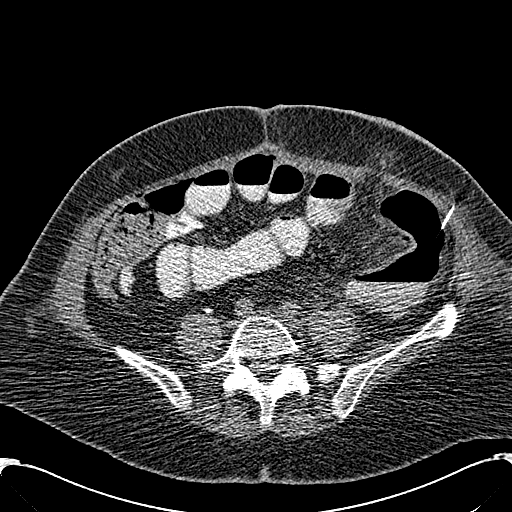
[im 53/133  soft-tissue]
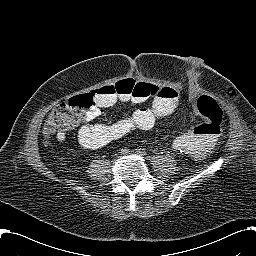
[im 80/133  soft-tissue]
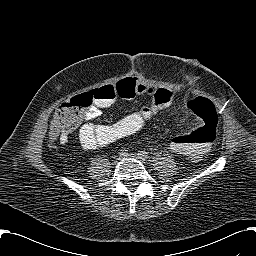
[im 106/133  soft-tissue]
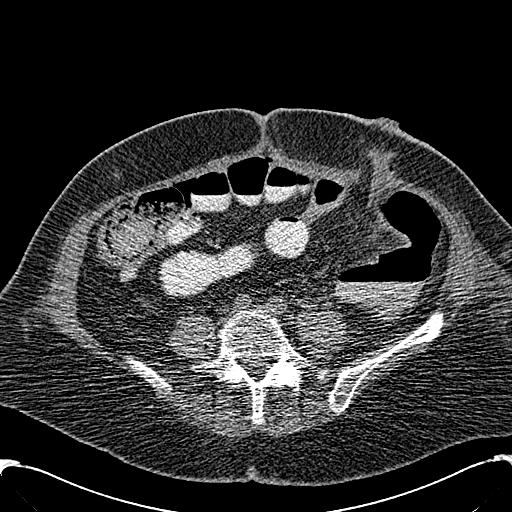

[7 of 16 positions shown; findings below may reference images not displayed]

PROCEDURE:     CT  - CT GUIDED ABSCESS DRAINAGE  - May 30, 2012  [DATE]

RESULT:

Procedure: The patient was informed of the risks and benefits of the
procedure and proper informed consent was obtained. The patient was brought
to the CT Suite and the lower abdomen was evaluated. A loculated collection
containing air and fluid is identified within the left lower quadrant.
Dilute contrast is also identified within the collection. A proper entry
site was established utilizing CT-guidance. The overlying soft tissues were
then prepped and draped in the usual sterile fashion. Utilizing 10 mL of 1%
lidocaine without epinephrine the overlying soft tissues were anesthetized.
The patient received conscious sedation via titrated IV doses of fentanyl
and Versed. Amounts are recorded in the nursing note. The fluid collection
was then accessed with a 21-gauge/15 cm Chiba biopsy needle. Appropriate
positioning of the needle was confirmed with CT-guidance. Greenish thin
fluid was aspirated from the collection. Access was then maintained with a
.018 guidewire. The collection was accessed with a 6 French microcatheter.
The .018 guidewire was removed and access was maintained with an Amplatz
guidewire. The 6 French microcatheter was removed and the tract was dilated
to 8 French. An 8 French multipurpose drainage tube was placed into the
fluid collection over the Amplatz guidewire utilizing CT-guidance.
Appropriate catheter placement was confirmed and documented with CT. The
Amplatz guidewire was removed. The catheter was coped within the fluid
collection and secured to the body with a sterile StatLock device and
sterile dressing placed upon the catheter. The catheter drained
approximately 100 milliliters of yellow to brown colored fluid from the
collection. The patient tolerated the procedure without complications and
was returned to the floor to return to the patient's attending physician's
orders.
IMPRESSION: CT-guided abscess drainage as described above. The patient
tolerated the procedure without complications.

## 2013-11-03 ENCOUNTER — Encounter: Payer: Self-pay | Admitting: Internal Medicine

## 2013-11-03 ENCOUNTER — Ambulatory Visit (INDEPENDENT_AMBULATORY_CARE_PROVIDER_SITE_OTHER): Payer: Medicare Other | Admitting: Internal Medicine

## 2013-11-03 VITALS — BP 128/60 | HR 64 | Temp 98.2°F | Ht 63.25 in | Wt 181.8 lb

## 2013-11-03 DIAGNOSIS — K5792 Diverticulitis of intestine, part unspecified, without perforation or abscess without bleeding: Secondary | ICD-10-CM

## 2013-11-03 DIAGNOSIS — R030 Elevated blood-pressure reading, without diagnosis of hypertension: Secondary | ICD-10-CM

## 2013-11-03 DIAGNOSIS — Z86010 Personal history of colonic polyps: Secondary | ICD-10-CM

## 2013-11-03 DIAGNOSIS — C569 Malignant neoplasm of unspecified ovary: Secondary | ICD-10-CM

## 2013-11-03 DIAGNOSIS — IMO0001 Reserved for inherently not codable concepts without codable children: Secondary | ICD-10-CM

## 2013-11-03 DIAGNOSIS — E7800 Pure hypercholesterolemia, unspecified: Secondary | ICD-10-CM

## 2013-11-03 DIAGNOSIS — K5732 Diverticulitis of large intestine without perforation or abscess without bleeding: Secondary | ICD-10-CM

## 2013-11-03 DIAGNOSIS — I10 Essential (primary) hypertension: Secondary | ICD-10-CM

## 2013-11-03 NOTE — Assessment & Plan Note (Signed)
Blood pressure as outlined.  Will have her spot check her pressure and record.  Get her back in soon to reassess.

## 2013-11-03 NOTE — Progress Notes (Signed)
Pre visit review using our clinic review tool, if applicable. No additional management support is needed unless otherwise documented below in the visit note. 

## 2013-11-03 NOTE — Progress Notes (Signed)
  Subjective:    Patient ID: [name] [name], female    DOB: 04/10/1934, 80 y.o.   MRN: [id number]  HPI 80 year old female with past history of hypertension, hypercholesterolemia, diverticulitis s/p colectomy and ovarian cancer.  She comes in today for a scheduled follow up.  Here to follow up regarding her blood pressure.  She had a complete hysterectomy (performed by Dr Amalia Hailey) for ovarian cancer.  S/p chemotherapy.  Has done well.  Has been released.  Had a diverticular flare 04/2012.  Was placed on oral abx as an outpatient.  Was evaluated by GI Dawson Bills).  Admitted.  Placed on IV abx.  No better.  Dr Tamala Julian performed the colectomy.  Had reversal 03/19/13.  Has done well.  Has been released by surgery.  Bowels doing well.  States overall she is doing relatively well.  No chest pain or tightness with increased activity or exertion.  Tries to stay active.  Is intolerant to statins.  Had muscle soreness.   Due to have her mammogram next week.     Past Medical History  Diagnosis Date  . Hypertension   . Hyperlipidemia   . Endometriosis   . Fibrocystic breast disease   . Glaucoma   . Cancer     ovarian  . Sinusitis   . History of chicken pox   . Diverticulitis of sigmoid colon   . History of colon polyps   . Tuberculosis     positve skin test  . H/O urinary incontinence     Outpatient Encounter Prescriptions as of 11/03/2013  Medication Sig  . Calcium Carbonate-Vit D-Min (CALCIUM 600 + MINERALS) 600-200 MG-UNIT TABS Take by mouth once. Calcium 600 + Minerals (calcium carbonate-vit d3-min) 600 mg calcium- 200 unit  . irbesartan-hydrochlorothiazide (AVALIDE) 300-12.5 MG per tablet Take 1 tablet by mouth daily.    Review of Systems Patient denies any headache, lightheadedness or dizziness.  No sinus or allergy symptoms.  No chest pain, tightness or palpitations.  No increased shortness of breath, cough or congestion.  No nausea or vomiting.  No abdominal pain or cramping.  No acid reflux.  No  bowel change, such as BRBPR or melana.  No urine change.  Has done well with her recent surgery.  Had reverasal.  Overall she feel she is doing well.  She brings in blood pressure readings and her blood pressure is averaging:  120-130s/60-70.  Her curr correlates.       Objective:   Physical Exam  Filed Vitals:   11/03/13 1101  BP: 128/60  Pulse: 64  Temp: 98.2 F (36.8 C)   Blood pressure:  132/68, pulse 63  80 year old female in no acute distress.   HEENT:  Nares- clear.  Oropharynx - without lesions. NECK:  Supple.  Nontender.  No audible bruit.  HEART:  Appears to be regular. LUNGS:  No crackles or wheezing audible.  Respirations even and unlabored.  RADIAL PULSE:  Equal bilaterally.   ABDOMEN:  Soft, nontender.  Bowel sounds present and normal.  No audible abdominal bruit.   EXTREMITIES:  No increased edema present.  DP pulses palpable and equal bilaterally.          Assessment & Plan:  HEALTH MAINTENANCE.  Physical 01/13/13.  Mammogram 11/07/12.  Scheduled for a f/u mammogram next week. Colonoscopy 10/16/12.

## 2013-11-07 ENCOUNTER — Encounter: Payer: Self-pay | Admitting: Internal Medicine

## 2013-11-07 NOTE — Assessment & Plan Note (Signed)
Low cholesterol diet.  Intolerant of statins.   Follow lipid panel.  Cholesterol 09/18/13 revealed total cholesterol 20, triglycerides 164, HDL 51 and LDL 119.

## 2013-11-07 NOTE — Assessment & Plan Note (Signed)
Up to date with colonoscopy as outlined.    

## 2013-11-07 NOTE — Assessment & Plan Note (Signed)
Has been followed at [facility].  Has done well.   

## 2013-11-07 NOTE — Assessment & Plan Note (Signed)
Continue current medication regimen.  Blood pressures doing well.  Follow metabolic panel.  Her cuff correlates.

## 2013-11-07 NOTE — Assessment & Plan Note (Signed)
Required (what sounds like) a partial colectomy.  Had an ostomy.  Seeing GI and Dr Smith.  Had a f/u colonoscopy 10/16/12.  Is now s/p reversal (03/19/13).  Has done well.  Follow.   

## 2013-11-09 ENCOUNTER — Ambulatory Visit: Payer: Self-pay | Admitting: Internal Medicine

## 2013-11-09 LAB — HM MAMMOGRAPHY: HM MAMMO: NEGATIVE

## 2013-11-10 ENCOUNTER — Encounter: Payer: Self-pay | Admitting: Internal Medicine

## 2014-02-08 ENCOUNTER — Other Ambulatory Visit (INDEPENDENT_AMBULATORY_CARE_PROVIDER_SITE_OTHER): Payer: Medicare Other

## 2014-02-08 ENCOUNTER — Encounter: Payer: Self-pay | Admitting: Internal Medicine

## 2014-02-08 DIAGNOSIS — E7800 Pure hypercholesterolemia, unspecified: Secondary | ICD-10-CM

## 2014-02-08 DIAGNOSIS — I10 Essential (primary) hypertension: Secondary | ICD-10-CM

## 2014-02-08 DIAGNOSIS — C569 Malignant neoplasm of unspecified ovary: Secondary | ICD-10-CM

## 2014-02-08 LAB — CBC WITH DIFFERENTIAL/PLATELET
BASOS PCT: 0.4 % (ref 0.0–3.0)
Basophils Absolute: 0 10*3/uL (ref 0.0–0.1)
EOS ABS: 0 10*3/uL (ref 0.0–0.7)
Eosinophils Relative: 0.2 % (ref 0.0–5.0)
HCT: 40 % (ref 36.0–46.0)
Hemoglobin: 13.2 g/dL (ref 12.0–15.0)
LYMPHS ABS: 2.4 10*3/uL (ref 0.7–4.0)
Lymphocytes Relative: 22.9 % (ref 12.0–46.0)
MCHC: 32.9 g/dL (ref 30.0–36.0)
MCV: 90.1 fl (ref 78.0–100.0)
MONO ABS: 0.9 10*3/uL (ref 0.1–1.0)
Monocytes Relative: 8.4 % (ref 3.0–12.0)
Neutro Abs: 7.1 10*3/uL (ref 1.4–7.7)
Neutrophils Relative %: 68.1 % (ref 43.0–77.0)
PLATELETS: 311 10*3/uL (ref 150.0–400.0)
RBC: 4.44 Mil/uL (ref 3.87–5.11)
RDW: 14.7 % (ref 11.5–15.5)
WBC: 10.4 10*3/uL (ref 4.0–10.5)

## 2014-02-08 LAB — HEPATIC FUNCTION PANEL
ALT: 18 U/L (ref 0–35)
AST: 20 U/L (ref 0–37)
Albumin: 3.6 g/dL (ref 3.5–5.2)
Alkaline Phosphatase: 102 U/L (ref 39–117)
BILIRUBIN TOTAL: 0.7 mg/dL (ref 0.2–1.2)
Bilirubin, Direct: 0.1 mg/dL (ref 0.0–0.3)
Total Protein: 7.5 g/dL (ref 6.0–8.3)

## 2014-02-08 LAB — BASIC METABOLIC PANEL
BUN: 9 mg/dL (ref 6–23)
CO2: 26 mEq/L (ref 19–32)
CREATININE: 0.8 mg/dL (ref 0.4–1.2)
Calcium: 9.2 mg/dL (ref 8.4–10.5)
Chloride: 105 mEq/L (ref 96–112)
GFR: 78.25 mL/min (ref >60.00)
GLUCOSE: 94 mg/dL (ref 70–99)
Potassium: 4.5 mEq/L (ref 3.5–5.1)
Sodium: 140 mEq/L (ref 135–145)

## 2014-02-08 LAB — LIPID PANEL
CHOLESTEROL: 194 mg/dL (ref 0–200)
HDL: 57.4 mg/dL (ref >39.00)
LDL Cholesterol: 110 mg/dL — ABNORMAL HIGH (ref 0–99)
NonHDL: 136.6
TRIGLYCERIDES: 133 mg/dL (ref 0.0–149.0)
Total CHOL/HDL Ratio: 3
VLDL: 26.6 mg/dL (ref 0.0–40.0)

## 2014-02-08 LAB — TSH: TSH: 0.81 u[IU]/mL (ref 0.35–4.50)

## 2014-02-09 ENCOUNTER — Telehealth: Payer: Self-pay | Admitting: Internal Medicine

## 2014-02-09 ENCOUNTER — Encounter: Payer: Self-pay | Admitting: Internal Medicine

## 2014-02-09 ENCOUNTER — Ambulatory Visit (INDEPENDENT_AMBULATORY_CARE_PROVIDER_SITE_OTHER): Payer: Medicare Other | Admitting: Internal Medicine

## 2014-02-09 VITALS — BP 130/70 | HR 60 | Temp 98.4°F | Ht 63.5 in | Wt 183.5 lb

## 2014-02-09 DIAGNOSIS — E7800 Pure hypercholesterolemia, unspecified: Secondary | ICD-10-CM

## 2014-02-09 DIAGNOSIS — C569 Malignant neoplasm of unspecified ovary: Secondary | ICD-10-CM

## 2014-02-09 DIAGNOSIS — K5732 Diverticulitis of large intestine without perforation or abscess without bleeding: Secondary | ICD-10-CM

## 2014-02-09 DIAGNOSIS — Z860100 Personal history of colon polyps, unspecified: Secondary | ICD-10-CM

## 2014-02-09 DIAGNOSIS — F439 Reaction to severe stress, unspecified: Secondary | ICD-10-CM

## 2014-02-09 DIAGNOSIS — I10 Essential (primary) hypertension: Secondary | ICD-10-CM

## 2014-02-09 DIAGNOSIS — Z733 Stress, not elsewhere classified: Secondary | ICD-10-CM

## 2014-02-09 NOTE — Progress Notes (Signed)
Subjective:    Patient ID: [name] [name], female    DOB: 1933/08/23, 80 y.o.   MRN: [id number]  HPI 80 year old female with past history of hypertension, hypercholesterolemia, diverticulitis s/p colectomy and ovarian cancer. She comes in today to follow up on these issues as well as for a complete physical exam.  She had a complete hysterectomy (performed by Dr Amalia Hailey) for ovarian cancer.  S/p chemotherapy.  Has done well.  Has been released.  Had a diverticular flare 04/2012.  Was placed on oral abx as an outpatient.  Was evaluated by GI Dawson Bills).  Admitted.  Placed on IV abx.  No better.  Dr Tamala Julian performed the colectomy.  Had reversal 03/19/13.  Has done well.  Has been released by surgery.  Bowels doing well.  No chest pain or tightness with increased activity or exertion.  Has a dog now.  Walks her dog two times a day.  No cardiac symptoms with increased activity or exertion.  Tries to stay active.  Is intolerant to statins.  Had muscle soreness.  Cholesterol doing well off.  Increased stress recently with her husbands death.  She feels she is handling things relatively well.  Does not feel she needs anything more at this time.  Has good support.  Does report some cough with increased drainage.  No chest congestion.  No fever.  No sob.     Past Medical History  Diagnosis Date  . Hypertension   . Hyperlipidemia   . Endometriosis   . Fibrocystic breast disease   . Glaucoma   . Cancer     ovarian  . Sinusitis   . History of chicken pox   . Diverticulitis of sigmoid colon   . History of colon polyps   . Tuberculosis     positve skin test  . H/O urinary incontinence     Outpatient Encounter Prescriptions as of 02/09/2014  Medication Sig  . Calcium Carbonate-Vit D-Min (CALCIUM 600 + MINERALS) 600-200 MG-UNIT TABS Take by mouth once. Calcium 600 + Minerals (calcium carbonate-vit d3-min) 600 mg calcium- 200 unit  . [DISCONTINUED] irbesartan-hydrochlorothiazide (AVALIDE) 300-12.5 MG per  tablet Take 1 tablet by mouth daily.    Review of Systems Patient denies any headache, lightheadedness or dizziness.  No sinus pressure.  Some increase drainage. No sore throat.  No chest pain, tightness or palpitations.  No increased shortness of breath or chest congestion. Some cough as outlined.  No nausea or vomiting.  No abdominal pain or cramping.  No acid reflux.  No bowel change, such as BRBPR or melana.  No urine change.  Has done well with her recent surgery.  Had reverasal.  Bowels doing well.  Increased stress with the recent death of her husband.  Feels she is coping relatively well.  See above.        Objective:   Physical Exam  Filed Vitals:   02/09/14 1031  BP: 130/70  Pulse: 60  Temp: 98.4 F (36.9 C)   Blood pressure recheck:  77/47  80 year old female in no acute distress.   HEENT:  Nares- clear.  Oropharynx - without lesions. NECK:  Supple.  Nontender.  No audible bruit.  HEART:  Appears to be regular. LUNGS:  No crackles or wheezing audible.  Respirations even and unlabored.  RADIAL PULSE:  Equal bilaterally.    BREASTS:  No nipple discharge or nipple retraction present.  Could not appreciate any distinct nodules or axillary adenopathy.  ABDOMEN:  Soft,  nontender.  Bowel sounds present and normal.  No audible abdominal bruit.  GU:  Not performed.     EXTREMITIES:  No increased edema present.  DP pulses palpable and equal bilaterally.          Assessment & Plan:  HEALTH MAINTENANCE.  Physical today.  Mammogram 11/09/13 - Birads I.   Colonoscopy 10/16/12.    I spent 25 minutes with the patient and more than 50% of the time was spent in consultation regarding the above.

## 2014-02-09 NOTE — Telephone Encounter (Signed)
Sorry, sent message to wrong person. Jessica-see below

## 2014-02-09 NOTE — Telephone Encounter (Signed)
There's an opening on 03/11/14 @ 11:30

## 2014-02-09 NOTE — Telephone Encounter (Signed)
Pt was seen today and is needing at 4 week 30 minute follow up. Please advise.

## 2014-02-09 NOTE — Progress Notes (Signed)
Pre visit review using our clinic review tool, if applicable. No additional management support is needed unless otherwise documented below in the visit note. 

## 2014-02-10 NOTE — Telephone Encounter (Signed)
Mailed unread message to pt  

## 2014-02-14 ENCOUNTER — Encounter: Payer: Self-pay | Admitting: Internal Medicine

## 2014-02-14 DIAGNOSIS — F439 Reaction to severe stress, unspecified: Secondary | ICD-10-CM | POA: Insufficient documentation

## 2014-02-14 NOTE — Assessment & Plan Note (Addendum)
Continue current medication regimen.  Blood pressures have been controlled.  Elevated today.  Feel related to increased stress.  Continue current medication regimen.  Follow metabolic panel.  Her cuff correlates.

## 2014-02-14 NOTE — Assessment & Plan Note (Signed)
Has been followed at [facility].  Has done well.

## 2014-02-14 NOTE — Assessment & Plan Note (Signed)
Increased stress with the recent death of her husband.  Feels she is coping relatively well.  Does not feels she needs anything more at this time.  Follow.

## 2014-02-14 NOTE — Assessment & Plan Note (Signed)
Required (what sounds like) a partial colectomy.  Had an ostomy.  Seeing GI and Dr Tamala Julian.  Had a f/u colonoscopy 10/16/12.  Is now s/p reversal (03/19/13).  Has done well.  Follow.

## 2014-02-14 NOTE — Assessment & Plan Note (Signed)
Low cholesterol diet.  Intolerant of statins.   Follow lipid panel.  Cholesterol just checked and revealed total cholesterol 194, triglycerides 133, HDL 57 and LDL 110.

## 2014-02-14 NOTE — Assessment & Plan Note (Signed)
Up to date with colonoscopy as outlined.

## 2014-03-11 ENCOUNTER — Ambulatory Visit: Payer: Medicare Other | Admitting: Internal Medicine

## 2014-03-26 ENCOUNTER — Ambulatory Visit (INDEPENDENT_AMBULATORY_CARE_PROVIDER_SITE_OTHER): Payer: Medicare Other | Admitting: Internal Medicine

## 2014-03-26 ENCOUNTER — Encounter: Payer: Self-pay | Admitting: Internal Medicine

## 2014-03-26 VITALS — BP 130/70 | HR 63 | Temp 97.9°F | Ht 63.5 in | Wt 179.5 lb

## 2014-03-26 DIAGNOSIS — Z658 Other specified problems related to psychosocial circumstances: Secondary | ICD-10-CM

## 2014-03-26 DIAGNOSIS — E7800 Pure hypercholesterolemia, unspecified: Secondary | ICD-10-CM

## 2014-03-26 DIAGNOSIS — F439 Reaction to severe stress, unspecified: Secondary | ICD-10-CM

## 2014-03-26 DIAGNOSIS — I10 Essential (primary) hypertension: Secondary | ICD-10-CM

## 2014-03-26 DIAGNOSIS — C569 Malignant neoplasm of unspecified ovary: Secondary | ICD-10-CM

## 2014-03-26 NOTE — Progress Notes (Signed)
Subjective:    Patient ID: [name] [name], female    DOB: Nov 21, 1933, 80 y.o.   MRN: [id number]  HPI 80 year old female with past history of hypertension, hypercholesterolemia, diverticulitis s/p colectomy and ovarian cancer. She comes in today for a scheduled follow up.  She had a complete hysterectomy (performed by Dr Amalia Hailey) for ovarian cancer.  S/p chemotherapy.  Has done well.  Has been released.  Had a diverticular flare 04/2012.  Was placed on oral abx as an outpatient.  Was evaluated by GI Dawson Bills).  Admitted.  Placed on IV abx.  No better.  Dr Tamala Julian performed the colectomy.  Had reversal 03/19/13.  Has done well.  Has been released by surgery.  Bowels doing well.  No chest pain or tightness with increased activity or exertion.  No cardiac symptoms with increased activity or exertion.  Tries to stay active.  Is intolerant to statins.  Had muscle soreness.  Cholesterol doing well off.  Increased stress recently with her husbands death.  She feels she is handling things relatively well. Feels she is doing better.  Does not feel she needs anything more at this time.  Has good support.   No chest congestion.  No fever.  No sob.  Blood pressure averaging 120-140/60-70.   Past Medical History  Diagnosis Date  . Hypertension   . Hyperlipidemia   . Endometriosis   . Fibrocystic breast disease   . Glaucoma   . Cancer     ovarian  . Sinusitis   . History of chicken pox   . Diverticulitis of sigmoid colon   . History of colon polyps   . Tuberculosis     positve skin test  . H/O urinary incontinence     Outpatient Encounter Prescriptions as of 03/26/2014  Medication Sig  . Calcium Carbonate-Vit D-Min (CALCIUM 600 + MINERALS) 600-200 MG-UNIT TABS Take by mouth once. Calcium 600 + Minerals (calcium carbonate-vit d3-min) 600 mg calcium- 200 unit    Review of Systems Patient denies any headache, lightheadedness or dizziness.  No sinus pressure.   No sore throat.  No chest pain, tightness or  palpitations.  No increased shortness of breath or chest congestion.  No nausea or vomiting.  No abdominal pain or cramping.  No acid reflux.  No bowel change, such as BRBPR or melana.  No urine change.  Has done well with her recent surgery.  Had reverasal.  Bowels doing well.  Increased stress with the recent death of her husband.  Feels she is coping relatively well.  Blood pressures doing well.  Follow.        Objective:   Physical Exam  Filed Vitals:   03/26/14 0831  BP: 130/70  Pulse: 63  Temp: 97.9 F (36.6 C)   Blood pressure recheck:  59/28  80 year old female in no acute distress.   HEENT:  Nares- clear.  Oropharynx - without lesions. NECK:  Supple.  Nontender.  No audible bruit.  HEART:  Appears to be regular. LUNGS:  No crackles or wheezing audible.  Respirations even and unlabored.  RADIAL PULSE:  Equal bilaterally.  ABDOMEN:  Soft, nontender.  Bowel sounds present and normal.  No audible abdominal bruit.     EXTREMITIES:  No increased edema present.  DP pulses palpable and equal bilaterally.          Assessment & Plan:  HEALTH MAINTENANCE.  Physical 02/09/14.  Mammogram 11/09/13 - Birads I.   Colonoscopy 10/16/12.  Problem List Items Addressed This Visit   Essential hypertension, benign - Primary     Continue current medication regimen.  Blood pressure as outlined. Follow metabolic panel.  Her cuff correlates.       Hypercholesterolemia     Low cholesterol diet.  Intolerant of statins.   Follow lipid panel.  Cholesterol last checked and revealed total cholesterol 194, triglycerides 133, HDL 57 and LDL 110.       Ovarian cancer     Has been followed at [facility].  Has done well.      Stress     Increased stress with the recent death of her husband.  Feels she is coping relatively well.  Does not feels she needs anything more at this time.  Feels she is doing better.  Follow.

## 2014-03-26 NOTE — Progress Notes (Signed)
Pre visit review using our clinic review tool, if applicable. No additional management support is needed unless otherwise documented below in the visit note. 

## 2014-03-27 ENCOUNTER — Encounter: Payer: Self-pay | Admitting: Internal Medicine

## 2014-03-27 NOTE — Assessment & Plan Note (Addendum)
Low cholesterol diet.  Intolerant of statins.   Follow lipid panel.  Cholesterol last checked and revealed total cholesterol 194, triglycerides 133, HDL 57 and LDL 110.

## 2014-03-27 NOTE — Assessment & Plan Note (Signed)
Has been followed at [facility].  Has done well.

## 2014-03-27 NOTE — Assessment & Plan Note (Signed)
Increased stress with the recent death of her husband.  Feels she is coping relatively well.  Does not feels she needs anything more at this time.  Feels she is doing better.  Follow.

## 2014-03-27 NOTE — Assessment & Plan Note (Signed)
Continue current medication regimen.  Blood pressure as outlined. Follow metabolic panel.  Her cuff correlates.

## 2014-06-28 ENCOUNTER — Ambulatory Visit: Payer: Medicare Other | Admitting: Internal Medicine

## 2014-07-02 ENCOUNTER — Ambulatory Visit: Payer: Medicare Other | Admitting: Internal Medicine

## 2014-07-15 ENCOUNTER — Encounter: Payer: Self-pay | Admitting: Nurse Practitioner

## 2014-07-15 ENCOUNTER — Ambulatory Visit (INDEPENDENT_AMBULATORY_CARE_PROVIDER_SITE_OTHER): Payer: Medicare Other | Admitting: Nurse Practitioner

## 2014-07-15 VITALS — BP 152/78 | HR 64 | Temp 98.3°F | Resp 12 | Ht 63.0 in | Wt 176.0 lb

## 2014-07-15 DIAGNOSIS — R8299 Other abnormal findings in urine: Secondary | ICD-10-CM

## 2014-07-15 DIAGNOSIS — R8290 Unspecified abnormal findings in urine: Secondary | ICD-10-CM | POA: Insufficient documentation

## 2014-07-15 DIAGNOSIS — I10 Essential (primary) hypertension: Secondary | ICD-10-CM

## 2014-07-15 DIAGNOSIS — N898 Other specified noninflammatory disorders of vagina: Secondary | ICD-10-CM

## 2014-07-15 LAB — POCT URINALYSIS DIPSTICK
BILIRUBIN UA: NEGATIVE
Blood, UA: NEGATIVE
Glucose, UA: NEGATIVE
Ketones, UA: NEGATIVE
Nitrite, UA: NEGATIVE
PROTEIN UA: NEGATIVE
SPEC GRAV UA: 1.015
Urobilinogen, UA: 0.2
pH, UA: 6

## 2014-07-15 NOTE — Assessment & Plan Note (Signed)
Resolved. Pt no longer has the issue. She was instructed to come back if she sees it again. Will culture urine. POCT revealed only trace Leukocytes. FU prn worsening/failure to improve.

## 2014-07-15 NOTE — Patient Instructions (Signed)
Keep a watch on your blood pressure.   Watch salt intake. Drink lots of water.   Call us if worsens or fails to improve.

## 2014-07-15 NOTE — Progress Notes (Signed)
   Subjective:    Patient ID: [name] [name], female    DOB: January 27, 1934, 81 y.o.   MRN: [id number]  HPI  [name] [name] is a 81 yo female with a CC of now resolved vaginal discharge and resolved cloudy urine.   1) Started last Thursday, noticed discharge on pad, greyish green- pus like, small amount, no odor. Lasted through yesterday was less than usual. Urine has been cloudy, cleared up and then this morning was clear, noticed sediment "white flakes".   Not looking at BP at home.    Review of Systems  Constitutional: Negative for fever, chills, diaphoresis and fatigue.  Eyes: Negative for visual disturbance.  Respiratory: Negative for chest tightness, shortness of breath and wheezing.   Cardiovascular: Negative for chest pain, palpitations and leg swelling.  Gastrointestinal: Negative for nausea, vomiting, abdominal pain, diarrhea and abdominal distention.  Genitourinary: Positive for vaginal discharge. Negative for dysuria, frequency, hematuria, flank pain and difficulty urinating.       Vaginal discharge that has now resolved, seeing "white flakes" in urine.   Skin: Negative for rash.  Neurological: Negative for dizziness, weakness, numbness and headaches.  Psychiatric/Behavioral: The patient is not nervous/anxious.        Objective:   Physical Exam  Constitutional: She is oriented to person, place, and time. She appears well-developed and well-nourished. No distress.  BP 152/78 mmHg  Pulse 64  Temp(Src) 98.3 F (36.8 C)  Resp 12  Ht 5\' 3"  (1.6 m)  Wt 176 lb (79.833 kg)  BMI 31.18 kg/m2  SpO2 96% Pt has been eating more salt recently. Took 3 times by 2 staff members.   HENT:  Head: Normocephalic and atraumatic.  Right Ear: External ear normal.  Left Ear: External ear normal.  Cardiovascular: Normal rate, regular rhythm, normal heart sounds and intact distal pulses.  Exam reveals no gallop and no friction rub.   No murmur heard. Pulmonary/Chest: Effort normal and breath  sounds normal. No respiratory distress. She has no wheezes. She has no rales. She exhibits no tenderness.  Neurological: She is alert and oriented to person, place, and time. No cranial nerve deficit. She exhibits normal muscle tone. Coordination normal.  Skin: Skin is warm and dry. No rash noted. She is not diaphoretic.  Psychiatric: She has a normal mood and affect. Her behavior is normal. Judgment and thought content normal.      Assessment & Plan:

## 2014-07-15 NOTE — Addendum Note (Signed)
Addended by: Karlene Einstein D on: 07/15/2014 09:14 AM   Modules accepted: Orders

## 2014-07-15 NOTE — Progress Notes (Signed)
Pre visit review using our clinic review tool, if applicable. No additional management support is needed unless otherwise documented below in the visit note. 

## 2014-07-15 NOTE — Assessment & Plan Note (Signed)
Resolved. Pt no longer is having this issue. POCT urine shows trace Leukocytes- will culture. Encouraged increase in water intake. FU prn worsening

## 2014-07-15 NOTE — Assessment & Plan Note (Addendum)
Elevated BP today. Told [name] [name] to keep an eye on it at home. Drink water and limit salt intake. Follow. Goal for her age group is less than 150/90 and she is borderline.

## 2014-07-16 ENCOUNTER — Telehealth: Payer: Self-pay | Admitting: Internal Medicine

## 2014-07-16 NOTE — Telephone Encounter (Signed)
emmi emailed °

## 2014-07-17 LAB — URINE CULTURE: Colony Count: >=100000

## 2014-09-16 ENCOUNTER — Ambulatory Visit (INDEPENDENT_AMBULATORY_CARE_PROVIDER_SITE_OTHER): Payer: Medicare Other | Admitting: Internal Medicine

## 2014-09-16 ENCOUNTER — Encounter: Payer: Self-pay | Admitting: Internal Medicine

## 2014-09-16 DIAGNOSIS — C569 Malignant neoplasm of unspecified ovary: Secondary | ICD-10-CM | POA: Diagnosis not present

## 2014-09-16 DIAGNOSIS — Z86010 Personal history of colonic polyps: Secondary | ICD-10-CM | POA: Diagnosis not present

## 2014-09-16 DIAGNOSIS — I10 Essential (primary) hypertension: Secondary | ICD-10-CM | POA: Diagnosis not present

## 2014-09-16 DIAGNOSIS — E7800 Pure hypercholesterolemia, unspecified: Secondary | ICD-10-CM

## 2014-09-16 DIAGNOSIS — Z658 Other specified problems related to psychosocial circumstances: Secondary | ICD-10-CM

## 2014-09-16 DIAGNOSIS — Z1239 Encounter for other screening for malignant neoplasm of breast: Secondary | ICD-10-CM

## 2014-09-16 DIAGNOSIS — F439 Reaction to severe stress, unspecified: Secondary | ICD-10-CM

## 2014-09-16 DIAGNOSIS — Z0000 Encounter for general adult medical examination without abnormal findings: Secondary | ICD-10-CM | POA: Insufficient documentation

## 2014-09-16 NOTE — Assessment & Plan Note (Signed)
Not an issue currently.  Blood pressure as outlined.

## 2014-09-16 NOTE — Assessment & Plan Note (Signed)
Has a history of ovarian cancer.  Has been followed at [facility].

## 2014-09-16 NOTE — Assessment & Plan Note (Signed)
Doing well.  Handling things well.  Follow.  

## 2014-09-16 NOTE — Progress Notes (Signed)
Pre visit review using our clinic review tool, if applicable. No additional management support is needed unless otherwise documented below in the visit note. 

## 2014-09-16 NOTE — Assessment & Plan Note (Signed)
Colonoscopy 10/16/12.

## 2014-09-16 NOTE — Assessment & Plan Note (Signed)
Low cholesterol diet and exercise.  Last cholesterol panel wnl.   Lab Results  Component Value Date   CHOL 194 02/08/2014   HDL 57.40 02/08/2014   LDLCALC 110* 02/08/2014   TRIG 133.0 02/08/2014   CHOLHDL 3 02/08/2014

## 2014-09-16 NOTE — Progress Notes (Signed)
Patient ID: [name] [name], female   DOB: 1934/06/09, 81 y.o.   MRN: [id number]   Subjective:    Patient ID: [name] [name], female    DOB: 20-Apr-1934, 81 y.o.   MRN: [id number]  HPI  Patient here for a scheduled follow up.  Feels good.  Doing well coping with the stress of her husband's death.  Eating and drinking well.  Sleeping well.  Bowels stable.  Tries to stay active.     Past Medical History  Diagnosis Date  . Hypertension   . Hyperlipidemia   . Endometriosis   . Fibrocystic breast disease   . Glaucoma   . Cancer     ovarian  . Sinusitis   . History of chicken pox   . Diverticulitis of sigmoid colon   . History of colon polyps   . Tuberculosis     positve skin test  . H/O urinary incontinence     Current Outpatient Prescriptions on File Prior to Visit  Medication Sig Dispense Refill  . Calcium Carbonate-Vit D-Min (CALCIUM 600 + MINERALS) 600-200 MG-UNIT TABS Take by mouth once. Calcium 600 + Minerals (calcium carbonate-vit d3-min) 600 mg calcium- 200 unit     No current facility-administered medications on file prior to visit.    Review of Systems  Constitutional: Negative for appetite change and unexpected weight change.  HENT: Negative for congestion and sinus pressure.   Respiratory: Negative for cough, chest tightness and shortness of breath.   Cardiovascular: Negative for chest pain, palpitations and leg swelling.  Gastrointestinal: Negative for nausea, vomiting, abdominal pain and diarrhea.  Neurological: Positive for tremors.       Objective:     Blood pressure recheck:  136-138/72  Physical Exam  HENT:  Nose: Nose normal.  Mouth/Throat: Oropharynx is clear and moist.  Neck: Neck supple. No thyromegaly present.  Cardiovascular: Normal rate and regular rhythm.   Pulmonary/Chest: Breath sounds normal. No respiratory distress. She has no wheezes.  Abdominal: Soft. Bowel sounds are normal. There is no tenderness.  Musculoskeletal: She exhibits no  edema or tenderness.  Lymphadenopathy:    She has no cervical adenopathy.  Psychiatric: She has a normal mood and affect. Her behavior is normal.    BP 130/70 mmHg  Pulse 63  Temp(Src) 98.2 F (36.8 C) (Oral)  Ht 5\' 3"  (1.6 m)  Wt 179 lb 8 oz (81.421 kg)  BMI 31.81 kg/m2  SpO2 99% Wt Readings from Last 3 Encounters:  09/16/14 179 lb 8 oz (81.421 kg)  07/15/14 176 lb (79.833 kg)  03/26/14 179 lb 8 oz (81.421 kg)     Lab Results  Component Value Date   WBC 10.4 02/08/2014   HGB 13.2 02/08/2014   HCT 40.0 02/08/2014   PLT 311.0 02/08/2014   GLUCOSE 94 02/08/2014   CHOL 194 02/08/2014   TRIG 133.0 02/08/2014   HDL 57.40 02/08/2014   LDLCALC 110* 02/08/2014   ALT 18 02/08/2014   AST 20 02/08/2014   NA 140 02/08/2014   K 4.5 02/08/2014   CL 105 02/08/2014   CREATININE 0.8 02/08/2014   BUN 9 02/08/2014   CO2 26 02/08/2014   TSH 0.81 02/08/2014       Assessment & Plan:   Problem List Items Addressed This Visit    Essential hypertension, benign    Not an issue currently.  Blood pressure as outlined.        Health care maintenance    Physical 02/09/14.  Mammogram 11/09/13 -  Birads I.  Given information to schedule her mammogram.  Colonoscopy 10/16/12.        History of colonic polyps    Colonoscopy 10/16/12.        Hypercholesterolemia    Low cholesterol diet and exercise.  Last cholesterol panel wnl.   Lab Results  Component Value Date   CHOL 194 02/08/2014   HDL 57.40 02/08/2014   LDLCALC 110* 02/08/2014   TRIG 133.0 02/08/2014   CHOLHDL 3 02/08/2014        Ovarian cancer    Has a history of ovarian cancer.  Has been followed at [facility].       Stress    Doing well.  Handling things well.  Follow.         Other Visit Diagnoses    Breast cancer screening        Relevant Orders    MM DIGITAL SCREENING BILATERAL        Einar Pheasant, MD

## 2014-09-16 NOTE — Assessment & Plan Note (Signed)
Physical 02/09/14.  Mammogram 11/09/13 - Birads I.  Given information to schedule her mammogram.  Colonoscopy 10/16/12.

## 2014-09-28 NOTE — Consult Note (Signed)
CC: diverticular abcess.  Pt afebrile, eating same diet, abd minimal tender in LLQ, chest clear, WBC down to 20K, hgb 10.6, plt 339,  she feels about the same.  I will order daily CBC, likely need repeat CT a week after first one.  She may be converted to [facility] for out patient therapy if continues to improve.  but possible she will still need surgery.  Electronic Signatures: Manya Silvas (MD)  (Signed on 07-Dec-13 09:23)  Authored  Last Updated: 07-Dec-13 09:23 by Manya Silvas (MD)

## 2014-09-28 NOTE — Consult Note (Signed)
I will sign off, reconsult if needed.  Electronic Signatures: Manya Silvas (MD)  (Signed on 16-Dec-13 16:31)  Authored  Last Updated: 16-Dec-13 16:31 by Manya Silvas (MD)

## 2014-09-28 NOTE — Consult Note (Signed)
PATIENT NAME:  [name] [name], [name] [name] MR#:  [id number] DATE OF BIRTH:  [birth year]  DATE OF CONSULTATION:  05/15/2012  REFERRING PHYSICIAN:   CONSULTING PHYSICIAN:  Loreli Dollar, MD  HISTORY OF PRESENT ILLNESS: This 81 year old female was referred for surgery consultation with a chief complaint of abdominal pain. She reports that earlier in November she had eaten some barbecue which she was told was not refrigerated safely and two days later did have some lower abdominal discomfort which was mild, which was short-lived, and later resolved. Then last week she had fairly severe left lower quadrant abdominal pain. She felt cool sometimes, thought she might possibly have some fever but temperature was not taken. She had no nausea or vomiting. She had been moving her bowels satisfactorily having no constipation, diarrhea, or rectal bleeding. She saw her physician, Dr. Marcie Bal Dear, who prescribed Augmentin 2 grams b.i.d. She started taking the antibiotic but reports persistent left lower quadrant mild degree of pain. She reports that when she empties her bladder she will feel the pain. Has noted some increased urinary frequency but not dysuria. She reports she has been tolerating diet, ate some scrambled eggs this morning but was put on a clear liquid diet by suppertime and she did take her clear liquids well for supper. She reports that walking does not significantly aggravate the pain and she is not having any great difficulty walking.   PAST MEDICAL HISTORY:  1. Ovarian cancer. She had surgery in 2002, had laparotomy with omentectomy, appendectomy, total abdominal hysterectomy, bilateral salpingo-oophorectomy. Also was treated with chemotherapy and has been followed at [facility]. Has had no evidence of recurrence. 2. History of hypertension.  3. Hyperlipidemia. 4. Fibrocystic breast disease. 5. Glaucoma.  6. Sinusitis. 7. Remote history of endometriosis.   PAST SURGICAL HISTORY:  1. Above-mentioned ovarian  cancer surgery. 2. Excision of benign left breast mass. 3. History of sinus infection and surgery.    MEDICATIONS:  1. Calcium 600 with minerals daily.  2. Irbesartan/hydrochlorothiazide 300/12.5 daily.  3. Recent amoxicillin.   DRUG ALLERGIES: Avelox and statins.   SOCIAL HISTORY: Does not smoke. Does not drink any alcohol.   FAMILY HISTORY: Negative for colon cancer. Positive for uterine cancer in her mother. Bladder cancer in her brother. Family history is also positive for heart disease.   REVIEW OF SYSTEMS: She reports no recent acute illness such as cough, cold, or sore throat. She reports no recent visual changes. No difficulty swallowing. No swollen glands. No heartburn. No chest pains. No difficulty breathing. No ankle edema. Her review of systems are otherwise negative.   PHYSICAL EXAMINATION:   GENERAL: She is awake, alert, and oriented resting in the hospital bed but also walks easily.   VITAL SIGNS: Temperature 98.1, pulse 85, respirations 18, blood pressure 124/54, pulse oximetry 98%.   SKIN: Warm and dry without rash.   HEENT: Pupils equal, reactive to light. Extraocular movements are intact. Sclerae clear. Pharynx clear.   NECK: No palpable mass.   LUNGS: Lungs sounds were clear. No respiratory distress.   HEART: Regular rhythm, S1 and S2 without murmur.   ABDOMEN: Mildly obese and soft. There is mild left lower quadrant tenderness in the suprapubic area and more laterally in the left lower quadrant. No palpable mass. She has some mild degree of diastases recti in the periumbilical area.   EXTREMITIES: No dependent edema.   NEUROLOGIC: Awake, alert, oriented, moving all extremities.   CLINICAL DATA: Creatinine 0.67. Her white blood count yesterday was 19,900,  this morning 18,000, and this afternoon 25,300. The latest hemoglobin is 11.4 and neutrophils 80.4%. Blood culture was negative. Urinalysis with trace of bacteria.   EKG with sinus rhythm, evidence of  old anterior infarction dating back to at least 2005.  Chest x-ray, PA and lateral, with no acute pulmonary disease.   I reviewed her CT images yesterday and again today and noted gallstones, noted the absence of the omentum, mild diastases recti, evidence of inflammation in the left lower quadrant surrounding the sigmoid colon with evidence of abscess in the left lower quadrant which appears to be adjacent to the sigmoid colon and also in close proximity with the bladder.   I discussed with Dr. Vira Agar who has discussed with Dr. Register, the radiologist, the consideration of CT-guided catheter drainage. Dr. Register felt that CT-guided catheter drainage does not appear to be a choice of treatment in this case as it appears that there may be overlying bowel and with the size of the abscess it may be difficult to safely drain the pus.   I also discussed the treatment with the patient and recommended at present we give her clear liquids and Ensure. Continue the Zosyn. Encourage walking about each waking hour.   Continue to follow her with a course of antibiotics and watch for signs of improvement.   I also discussed the potential role of surgery which could be to drain the abscess. Also, she could potentially need to have a sigmoid colectomy which, in this case, may also need to have a temporary colostomy. At present we are going to give some additional time with nonoperative management and see if the Zosyn makes a difference. Will plan to follow.   ____________________________ Lenna Sciara. Rochel Brome, MD jws:drc D: 05/15/2012 19:11:31 ET T: 05/16/2012 07:31:15 ET JOB#: [id number]  cc: Loreli Dollar, MD, <Dictator> Loreli Dollar MD ELECTRONICALLY SIGNED 05/16/2012 18:27

## 2014-09-28 NOTE — Consult Note (Signed)
Pt feels good, only pain is when bladder is full and presses on colon.  relief with voiding.  WBC up a bit to 18.3, she is due for CT repeat tomorrow.  VSS afebrile.  No new suggestions.  See Dr. Angie Fava note yesterday.  Electronic Signatures: Manya Silvas (MD)  (Signed on 09-Dec-13 13:10)  Authored  Last Updated: 09-Dec-13 13:10 by Manya Silvas (MD)

## 2014-09-28 NOTE — Consult Note (Signed)
CC diverticular abcess.  Pt walked around nurses station a few times, has freq urination likely due to abcess effect on bladder.  No fever, no vomiting, still some abd discomfort.  WBC 22K.  Continue antibiotics.  Repeat CBC in morning  Electronic Signatures: Manya Silvas (MD)  (Signed on 06-Dec-13 19:15)  Authored  Last Updated: 06-Dec-13 19:15 by Manya Silvas (MD)

## 2014-09-28 NOTE — Consult Note (Signed)
PATIENT NAME:  [name] [name], [name] [name] MR#:  [id number] DATE OF BIRTH:  Mar 06, 1934  DATE OF CONSULTATION:  05/26/2012  REFERRING PHYSICIAN:  [place] Sink, MD CONSULTING PHYSICIAN:  Heinz Knuckles. Jaeven Wanzer, MD  REASON FOR CONSULTATION:  Diverticulitis with abscess formation.   HISTORY OF PRESENT ILLNESS:  The patient is a 81 year old female with a past history significant for ovarian cancer who was admitted on 05/14/2012 with an approximately 1-week history of worsening abdominal pain. The patient had been in her usual state of health when she began having abdominal pain. The pain became significantly worse and she went to see her primary care doctor. She was given an antibiotic for possible diverticulitis. Over the Thanksgiving holiday, she continued to worsen with increasing pain. She denied any fevers, chills or sweats, however. She was eventually seen by GI and a CAT scan was obtained which demonstrated diverticulitis with an abscess around the sigmoid colon. She was admitted to the hospital and started on Zosyn. She had been continued on Zosyn until 2 days ago when she was switched to ertapenem. She did undergo surgical drainage of the abscess with partial colectomy and diverting colostomy on 05/22/2012. She had been doing well postoperatively until today when she began having some nausea and vomiting. She is feeling somewhat better today.    ALLERGIES:  None. Note, the computer indicates she has an Avelox allergy which she reports is that she developed a yeast infection following Avelox. This would not be considered an allergy.   PAST MEDICAL HISTORY:  1.  Hypertension.  2.  Hypercholesterolemia.  3.  Endometriosis. 4.  Ovarian cancer status post hysterectomy and bilateral salpingo-oophorectomy. 5.  Sinusitis.   SOCIAL HISTORY:  The patient lives with her husband. She has a dog at home. She does not smoke. She does not drink.   FAMILY HISTORY:  Positive for bladder cancer and uterine  cancer.   REVIEW OF SYSTEMS: GENERAL:  No fevers, chills or sweats. Some malaise.  HEENT:  No headache, no sinus congestion, no sore throat.  NECK:  No stiffness. No swollen glands.  RESPIRATORY:  No cough, no shortness of breath, no sputum production.  CARDIAC:  No chest pains or palpitations.  GASTROINTESTINAL:  She had nausea and vomiting today, but this was not a major symptom prior to admission. She has had abdominal pain that was worse prior to admission and has improved somewhat and she had no change in her bowels.  GENITOURINARY:  No change in her urine.  MUSCULOSKELETAL:  No muscle or joint complaints.  SKIN:  No rashes.  NEUROLOGIC:  No focal weakness.  PSYCHIATRIC:  No complaints.   All other systems are negative.   PHYSICAL EXAMINATION:  VITAL SIGNS:  T-max of 99.6, T-current of 97.9, pulse of 76, blood pressure 153/64, 96% on room air.  GENERAL:  A 81 year old white female in no acute distress.  HEENT:  Normocephalic, atraumatic. Pupils equal and reactive to light. Extraocular motion intact. Sclerae, conjunctivae, and lids without evidence for emboli or petechiae. Oropharynx shows no erythema or exudate. Teeth and gums are in fair condition.  NECK:  Supple. Full range of motion. Midline trachea. No lymphadenopathy. No thyromegaly.  LUNGS:  Clear to auscultation bilaterally. Good air movement. No focal consolidation.  HEART:  Regular rate and rhythm without murmur, rub or gallop.  ABDOMEN:  Soft, mildly tender in the lower quadrants. No hepatosplenomegaly. No hernia is noted. She has a colostomy in place with minimal liquid stool present.  EXTREMITIES:  No  evidence of tenosynovitis.  SKIN:  No rashes. No stigmata of endocarditis, specifically no Janeway lesions or Osler's nodes.  NEUROLOGIC:  No focal weakness.  PSYCHIATRIC: Mood and affect appeared normal.  LABORATORY DATA:  BUN of 4, creatinine 0.46, bicarbonate of 28, anion gap of 5. White count of 14.7, hemoglobin 9.4,  platelet count of 505, ANC of 11.2. White count on admission was 19.9 and went as high as 29.4 on 05/23/2012. Wound culture from December 12th is growing E. coli and Pseudomonas. Blood cultures from admission show no growth. A UA from admission is unremarkable. Pathology report demonstrates ruptured diverticulitis with abscess formation on fistula tract and suppurative serositis. Chest x-ray from admission shows no acute cardiopulmonary disease. A CT from December 10th showed persistent changes of acute diverticulitis with air in the urinary bladder suggestive of possible fistulization. There was cholelithiasis without evidence for cholecystitis and a hiatal hernia present as well.   IMPRESSION:  A 81 year old female with a history of ovarian cancer admitted with diverticulitis with abscess status post partial colectomy with diverting colostomy.   RECOMMENDATIONS:  1.  Her intraoperative cultures are growing Escherichia coli and Pseudomonas. She was previously on Zosyn, but was changed to ertapenem 2 days ago. She has been doing fairly well postoperatively but had some nausea and vomiting today.  2.  If her nausea and vomiting continue, will consider imaging to look for ileus.  3.  Ertapenem does not have activity against Pseudomonas. Will change her back to Zosyn for now. If she improves and can take p.o. well, we could change her to Cipro and metronidazole. While not specifically identified, we will need to cover anaerobes and Cipro will cover both of the other organisms found.  4.  Will follow her CBC.   This is a moderately complex infectious disease case. Thank you very much for involving me in the patient's care.     ____________________________ Heinz Knuckles. Yeray Tomas, MD meb:es D: 05/26/2012 16:51:26 ET T: 05/27/2012 10:13:21 ET JOB#: [id number]  cc: Heinz Knuckles. Majorie Santee, MD, <Dictator> Ellaina Schuler E Dorothe Elmore MD ELECTRONICALLY SIGNED 05/27/2012 14:50

## 2014-09-28 NOTE — Consult Note (Signed)
She feels a little bit better, still some discomfort which is worse with bladder full.  VSS afebrile.  WBC down to 17.5, no peritoneal signs.  Will need to get repeat CT scan and arrange for home health to give iv antibiotics and therefore switch to once a day therapy like Invanz.  Her WBC has come down from 25 to 17.5.  Good news.  Electronic Signatures: Manya Silvas (MD)  (Signed on 08-Dec-13 10:05)  Authored  Last Updated: 08-Dec-13 10:05 by Manya Silvas (MD)

## 2014-09-28 NOTE — Op Note (Signed)
PATIENT NAME:  [name] [name], HARJU MR#:  [id number] DATE OF BIRTH:  December 21, 1933  DATE OF PROCEDURE:  05/20/2012  PREOPERATIVE DIAGNOSIS:  Diverticulitis with abscess and need for extended IV antibiotics.   POSTOPERATIVE DIAGNOSIS: Diverticulitis with abscess and need for extended IV antibiotics.   PROCEDURES:  1. Ultrasound guidance for vascular access to left brachial vein.  2. Fluoroscopic guidance for placement of catheter.  3. Insertion of peripherally inserted central venous catheter, left arm.  SURGEON:  Leotis Pain, MD  ANESTHESIA: Local.   ESTIMATED BLOOD LOSS: Minimal.   INDICATION FOR PROCEDURE:   81 year old white female with diverticulitis with abscess needed extended IV antibiotics. A PICC line is requested.   DESCRIPTION OF PROCEDURE: The patient's left arm was sterilely prepped and draped, and a sterile surgical field was created. The left brachial vein was accessed under direct ultrasound guidance without difficulty with a micropuncture needle and permanent image was recorded. 0.018 wire was then placed into the superior vena cava. Peel-away sheath was placed over the wire. A single lumen peripherally inserted central venous catheter was then placed over the wire and the wire and peel-away sheath were removed. The catheter tip was placed into the superior vena cava and was secured at the skin at 40 cm with a sterile dressing. The catheter withdrew blood well and flushed easily with heparinized saline. The patient tolerated procedure well.  ____________________________ Algernon Huxley, MD jsd:bjt D:  05/21/2012 14:26:01 ET          T: 05/21/2012 14:50:22 ET         JOB#: [id number]  Algernon Huxley MD ELECTRONICALLY SIGNED 05/26/2012 11:51

## 2014-09-28 NOTE — Consult Note (Signed)
Pt with diverticulitis and CT showing an abcess.  Pt in some discomfort, not as much as expected.  Admitted for iv antibiotics, consultation, possible needle drainage of abcess.  Pt told she may need surgery for this problem.  Dr. Tamala Julian consulted. Clear liq diet for now.  Will follow with you.  See office note by NP 2 days ago.  Exam today shows no strong peritoneal signs but tenderness in LLQ.  Pt has on compression devices.  PMH pertinent for ovarian cancer treated with surgery and chemo, she did not have radiation.  Will follow with you.  Electronic Signatures: Manya Silvas (MD)  (Signed on 04-Dec-13 17:29)  Authored  Last Updated: 04-Dec-13 17:29 by Manya Silvas (MD)

## 2014-09-28 NOTE — H&P (Signed)
PATIENT NAME:  [name] [name], [name] [name] MR#:  [id number] DATE OF BIRTH:  05-Jul-1933  DATE OF ADMISSION:  05/14/2012  PRIMARY CARE PHYSICIAN/REQUESTING PHYSICIAN: Dr. Marcie Bal Dear  GASTROENTEROLOGIST: Dr. Gaylyn Cheers   CHIEF COMPLAINT: Failure to treat diverticulitis outpatient.   HISTORY OF PRESENT ILLNESS: [name] [name] is a very nice 81 year old female who has history of hypertension, hyperlipidemia, endometriosis, previous ovarian cancer who comes today from Dr. Percell Boston office due to continuous abdominal pain, previously diagnosed with diverticulitis on 05/06/2012 for what she was taking Augmentin 1000 mg twice daily with no resolution of symptoms. The patient apparently had a barbecue dinner back in November and started having some little pinges right after. Since then she has been having constant lower abdominal pain, mostly on the left lower quadrant and it feels really sore for what her bowels have changed. She has not been eating much and she has been a little constipated. She states that she had a little bit of fever which was around 100 degrees within the first week of symptoms and then the pain got really bad on 11/23. It more on the left side of the lower quadrant. She decided to go to see her primary care physician for what she made an appointment for 05/06/2012. Due to the findings she was started on Augmentin 2 tablets twice daily for seven days but she did not get any better. She went to Dr. Vira Agar who ordered a CAT scan in the office. The CAT scan showed diverticulitis and a 3.6 x 5 cm fluid collection enveloped on the sigmoid colon. Patient was called and sent directly to [facility] for hospitalization. The patient is actually at her room right now. She is hemodynamically stable. She is fine. She says that her pain is just mostly soreness at this moment.   REVIEW OF SYSTEMS: CONSTITUTIONAL: Fever last week which and she has not had any more fever. No fatigue. No weakness. No weight loss or weight  gain. EYES: Patient states that she was previously diagnosed with picmentary glaucoma, but it resolved. No inflammation of the eyes. No redness. ENT: No tinnitus. No ear pain. No allergies. No sinus pain. RESPIRATORY: No cough, wheezing, hemoptysis or chronic obstructive pulmonary disease. CARDIOVASCULAR: No chest pain, orthopnea or palpitations. GASTROINTESTINAL: No nausea, no vomiting, no diarrhea. Positive deep abdominal pain. Positive for mild constipation. No hematochezia. No melena. No gastroesophageal reflux disease. No jaundice. Positive lack of appetite. She did have a colonoscopy in 2006 was unremarkable. GENITOURINARY: No dysuria, hematuria. Patient has significant incontinence for what she wears pads. GYN: She has fibrocystic disease but no major complaints at this moment. HEME/LYMPH: No anemia or easy bruising. ENDOCRINE: No polyuria, polydipsia, or polyphagia. No cold or heat intolerance. SKIN: No acne or rashes. MUSCULOSKELETAL: No significant joint pain, gout, or back pain. NEUROLOGICAL: No numbness, weakness or dementia. PSYCHIATRIC: No anxiety or depression.   PAST MEDICAL HISTORY:  1. Hypertension.  2. Hyperlipidemia.  3. Endometriosis.  4. Fibrocystic breast disease.  5. History of glaucoma.  6. Ovarian cancer.  7. Sinusitis.  8. Stress incontinence.   PAST SURGICAL HISTORY:  1. Left breast papilloma removal.  2. Sinus surgery.  3. Total abdominal hysterectomy with bilateral salpingo-oophorectomy with staging for ovarian cancer in 2002.  4. Sinus surgery in 2005 and 2006.   ALLERGIES: Patient is allergic to Avelox and statins due to myalgia.   SOCIAL HISTORY: Patient lives with her husband. She is independent. She is very active. She has never smoked. She does not drink  and she is retired.   FAMILY HISTORY: Positive to uterine cancer in his mother and bladder cancer in her brother. No coronary artery disease.   MEDICATIONS:  1. Calcium with minerals once  daily. 2. Irbesartan with hydrochlorothiazide 300/12.5 mg once a day. 3. Currently taking amoxicillin extended release 1000 mg 2 tablets twice daily.   PHYSICAL EXAMINATION:  VITAL SIGNS: Blood pressure 124/81, respiratory rate 16, pulse 90, temperature 98.4, oxygen saturation 97%.   GENERAL: Patient is alert, oriented x3, no acute distress, no respiratory distress, well nourished, well dressed. She does not look ill, nontoxic looking, very pleasant lady.   HEENT: Normocephalic, atraumatic. Pupils are equal and reactive. Extraocular movements are intact. Mucosa are moist. Anicteric sclerae. Pink conjunctivae. No oral lesions. No oropharyngeal exudates.   NECK: Supple. No JVD. No thyromegaly. No adenopathy. No carotid bruits auscultated.   CARDIOVASCULAR: Regular rate and rhythm. No murmurs, rubs, or gallops. No displacement of PMI. No tenderness to palpation of the chest.   LUNGS: Clear without any wheezing or crepitus. No use of accessory muscles. No dullness to percussion.   ABDOMEN: Soft, nontender, nondistended on the upper parts but once you palpate the left lower quadrant it is slightly tender. There is no rebound tenderness and there is no guarding. Bowel sounds are positive.   RECTAL: Deferred.   GENITAL: Deferred.   EXTREMITIES: No edema, no cyanosis, no clubbing.   NEUROLOGIC: Cranial nerves II through XII intact.   SKIN: No petechiae or rashes.   PSYCHIATRIC: Mood is normal. No signs of depression or anxiety at this moment.   MUSCULOSKELETAL: No joint effusions. No joint deformity.   LYMPHATICS: Negative for lymphadenopathy in neck or supraclavicular areas.   LABORATORY, DIAGNOSTIC AND RADIOLOGICAL DATA: At the office on 05/13/2012: White count 15.7, hemoglobin 11.5, hematocrit 35, platelets 380, sodium 137, potassium 4.5, BUN 9, creatinine 0.6. CT scan as mentioned above findings.   ASSESSMENT AND PLAN: 81 year old female with history of hypertension, hyperlipidemia,  endometriosis, history of previous ovarian cancer who comes with diagnosis of diverticulitis back on 05/06/2012, treated outpatient with Augmentin, failure to treatment. CT scan reveals a 2.6 x 5 cm fluid collection around the sigmoid.  1. Diverticulitis with perisigmoidal abscess. The patient is admitted for treatment with IV antibiotics at this moment. We are going to choose Zosyn to give her  good coverage for anaerobes and gram-negative bacteria and keep steady levels in her blood. I discussed the case with Dr. Vira Agar who agreed with this. He thinks that she might need to be discharged on IV antibiotics which I agree for what at that moment she could be switched to ertapenem depending on culture data. Surgery has been consulted by Dr. Vira Agar to evaluate drainage of the fluid, very likely to have interventional radiology to put a drain on it. We are going to order a PICC line for this patient. At this moment I am going to put her on regular diet and depending on the time of procedure she could be switched to n.p.o. She is hemodynamically stable, only has an elevated white blood count but she is not febrile.  2. Elevated white blood count. Follow up on labs in the morning.  3. Hypertension. Blood pressure seems to be well controlled. Continue irbesartan with hydrochlorothiazide. Watch for sodium.  4. Hyperlipidemia, diet controlled.  5. History of endometriosis and ovarian cancer, status post total abdominal hysterectomy and staging for cancer. Last CT scan did not show any significant disease.  6. GI prophylaxis. PPI.  7. Deep vein thrombosis prophylaxis with PACs and SCDs.   TIME SPENT: I spent about 45 minutes with this patient. Case discussed with Dr. Vira Agar.   CODE STATUS: Patient is a FULL CODE.   ____________________________ [place] Sink, MD rsg:cms D: 05/14/2012 15:19:38 ET T: 05/14/2012 16:46:10 ET JOB#: [id number]  cc: [place] Sink, MD, <Dictator> Marcie Bal Dear,  MD Cristi Loron MD ELECTRONICALLY SIGNED 05/16/2012 13:45

## 2014-09-28 NOTE — Op Note (Signed)
PATIENT NAME:  [name] [name] MR#:  [id number] DATE OF BIRTH:  1933/06/23  DATE OF PROCEDURE:  05/22/2012  PREOPERATIVE DIAGNOSIS: Acute diverticulitis with abscess and colovesical fistula.   PREOPERATIVE DIAGNOSES: Acute diverticulitis with abscess and colovesical fistula, adhesive peritonitis.    SURGEON: Loreli Dollar, MD  ANESTHESIA: General.  PROCEDURES: Sigmoid colon resection with colostomy, drainage of abscess and extensive lysis of adhesions.   INDICATION: This 81 year old female who has a history of ovarian cancer and hysterectomy for staging of ovarian cancer comes in with abdominal pain left lower quadrant tenderness, leukocytosis, CT findings of diverticulitis with abscess and subsequent findings of air within her bladder and surgery was recommended for definitive treatment.   DESCRIPTION OF PROCEDURE: The patient was placed on the operating table in the supine position under general endotracheal anesthesia. She was already receiving Zosyn for antibiotic treatment. She was placed in the lithotomy position using bumblebee stirrups. The circulating nurse inserted a Foley urinary catheter with Betadine preparation of the perineum draining a cloudy blood-tinged urine. The pubic hair was clipped. The abdomen was prepared with ChloraPrep and the perineum prepared with Betadine and the abdomen was draped in a sterile manner. Patient did have intermittent compression devices in place during the operation.   Lower abdominal midline incision was made at the site of an old scar, carried from the umbilicus down to the pubic symphysis and was carried down through subcutaneous tissues. Numerous small bleeding points were cauterized. The midline fascia was incised and the peritoneal cavity was opened. There were multiple loops of small bowel adherent to the anterior abdominal wall and these adhesions were taken down with blunt and sharp dissection. There were two small openings in the serosa which  were closed with 5-0 Vicryl. Further dissection was carried out mobilizing small bowel away from the abdominal wall on both sides of the incision and also dissected cephalad to the incision so that the incision could be lengthened up around the left of the umbilicus. Also, there were multiple adhesions in the pelvis. There was inflammation in the pelvis and mass formation involving the sigmoid colon. Multiple loops of small bowel were dissected away from the sigmoid colon with additional blunt and sharp dissection. One hour of time was spent lysing adhesions so that the small bowel could be mobilized away from the sigmoid colon and away from the pelvis.   After satisfactory mobilization of small bowel the proximal aspect of the sigmoid colon was mobilized with incision of the peritoneal reflection. The mass was further examined, appeared to be adherent to the bladder and further dissection was carried out mobilizing this mass of inflamed colon. There was an abscess encountered which was adjacent to the sigmoid colon and adjacent to the bladder. Pus was aspirated. It appeared that there was 20 or 30 mL of pus which was aspirated and a culture was submitted for routine study. Subsequently additional portions of the sigmoid colon and descending colon were mobilized with incision of the peritoneal reflection. Next, site for proximal margin of resection was selected and somewhat soft bowel just proximal to the inflamed segment and a window was created in the mesentery. The bowel was divided with the GIA 75 stapler. Next the Harmonic scalpel was used to dissect mesentery and dissected down deep to the inflammatory mass. There was one bleeding point which was suture ligated with 0 chromic. Multiple bleeding points controlled with Harmonic scalpel. There was marked amount of generalized oozing. Portions of the inflamed bowel were mobilized  with finger fracture technique. There was some continued oozing. Several small  bleeding points were cauterized. Further dissection was carried out below the inflammatory mass. As the inflammatory mass was finger fractured away from the bladder and identified bowel that was less inflamed below this and it was dissected out so that the green cartridge TA-60 stapler was placed across the bowel, engaged and activated and the specimen excised. The inflamed segment of bowel was approximately 5 inches in length and the distal end was tagged with a stitch for the pathologist's orientation and submitted for routine pathology. The distal stump was further examined and appeared to be intact. This was marked with a 2-0 Prolene suture which the tails were left 2 cm long for future identification. Some additional blood was aspirated and appeared that hemostasis was subsequently intact.   The descending colon was further mobilized with the Harmonic scalpel and mobilized to a point where bowel could be brought up to the abdominal wall. A site for colostomy was selected midway between the umbilicus and anterior/superior iliac spine. A circular incision was made in the skin and dissection was carried down through subcutaneous tissues using electrocautery for hemostasis. A cruciate incision was made in the anterior rectus sheath. Rectus muscles were spread to expose the posterior sheath which was incised. The path was dilated large enough to admit two fingers. Next Babcock clamp was used to deliver the proximal aspect of the sigmoid colon up into the wound in the abdominal wall and brought up beyond the skin and was held in place with Allis clamps. Next, the pelvis was further irrigated with saline solution. Hemostasis appeared to be intact. The liver was palpated and there was no palpable mass within the liver. It is also noted that she had had a previous hysterectomy and oophorectomy and there was no tumor formation identified in the pelvis. There was no substantial omentum to be brought beneath the wound.    The abdomen was closed with interrupted 0 Maxon figure-of-eight sutures in the fascia. The skin was closed with clips but placing clips far enough apart to allow for drainage and dressings were applied.   Next, the colostomy was matured by excising the staple line and sutured to the distal end of the bowel to the skin with interrupted 4-0 Vicryl sutures suturing the seromuscular coat of the colon and the full thickness of the cut edge to the skin and then applied colostomy bag using benzoin on the skin, cut out the opening in the wafer to fit and attached the wafer and the bag. Next, paper tape was used to hold the gauze dressings in place.   The patient appeared to tolerate procedure satisfactorily. Foley catheter is left in place anticipate for one week due to the history of colovesical fistula. It is noted that no urine was seen leaking out of the bladder during the course of the procedure.   It is also noted at this operation was requiring greater effort than normal case with more than an hour for lysis of adhesions and approximately four hours duration of the operation.  ____________________________ Lenna Sciara. Rochel Brome, MD jws:cms D: 05/22/2012 16:08:17 ET T: 05/23/2012 06:35:09 ET JOB#: [id number]  cc: Loreli Dollar, MD, <Dictator>  Loreli Dollar MD ELECTRONICALLY SIGNED 05/23/2012 18:24

## 2014-09-28 NOTE — Consult Note (Signed)
WBC 16.9. no change in symptoms.  She has had her repeat CT already but the report is not out.  Still eating, walking, has discomfort when bladder is full.  If no change in pocked of fluid she may need surgery.  Electronic Signatures: Manya Silvas (MD)  (Signed on 10-Dec-13 11:39)  Authored  Last Updated: 10-Dec-13 11:39 by Manya Silvas (MD)

## 2014-09-28 NOTE — Consult Note (Signed)
Impression: 81yo female w/ h/o ovarian CA admitted with diverticulitis with abscess, s/p partial colectomy with diverting colostomy.  Her intraoperative cultures are growing E. coli and Pseudomonas.  She previously was on zosynmeropenem, but was changed to ertapenem two days ago.  She had been doing fairly well postoperatively, but had some nausea and vomiting today. If her nausea and vomiting continue, would consider imaging to look for ileus. Ertapenem does not have activity against Pseudomonas.  Will change her back to zosyn meropenem for now. If she improves and can take po well, we could change her to cipro/metronidazole.  While not specifically identified, we will need to cover anaerobes and cipro will cover both of the other organsisms found. Will follow her WBC.   Electronic Signatures: Jaskaran Dauzat, Heinz Knuckles (MD) (Signed on 16-Dec-13 16:40)  Authored   Last Updated: 16-Dec-13 16:46 by Jaritza Duignan, Heinz Knuckles (MD)

## 2014-09-28 NOTE — Consult Note (Signed)
Pt failed trial of iv antibiotics and will have surgery tomorrow.  WBC 17K, VSS afebrile.  Ct showed findings likely c/w enterovesicular fistula.  Dr. Tamala Julian to do the surgery.  Electronic Signatures: Manya Silvas (MD)  (Signed on 11-Dec-13 17:36)  Authored  Last Updated: 11-Dec-13 17:36 by Manya Silvas (MD)

## 2014-09-28 NOTE — Consult Note (Signed)
Pt dosing of Zofran at lower range since it was ordered for "infection of unknown source"  She has abcess with diverticulitis.  Will recommend increase dose to usual dose of q 6 hours.  If WBC does not come down in 2-3 days I recommend surgery.  Will get ID consult tomorrow for recommendations on optimal antibiotic and dosage.   Electronic Signatures: Manya Silvas (MD) (Signed on 05-Dec-13 19:33)  Authored   Last Updated: 05-Dec-13 19:35 by Manya Silvas (MD)

## 2014-10-01 NOTE — Discharge Summary (Signed)
PATIENT NAME:  [name] [name], [name] [name] MR#:  [id number] DATE OF BIRTH:  1933-07-20  DATE OF ADMISSION:  12/09/2012 DATE OF DISCHARGE:  12/13/2012  HISTORY OF PRESENT ILLNESS: This 81 year old female was admitted for elective surgery. She had a history of diverticular abscess and colovesical fistula and emergency sigmoid colectomy with Hartmann procedure. It is further noted, she had findings of adhesive peritonitis. She had had previous ovarian cancer and surgery in 2002 which included total abdominal hysterectomy, bilateral salpingo-oophorectomy and omentectomy. Also has had chemotherapy for ovarian cancer.   At this time, she was admitted for laparotomy and closure of colostomy. Did have bowel preparation at home.   PAST MEDICAL HISTORY:  1.  As noted includes history of ovarian cancer.  2.  Hypertension.  3.  Hyperlipidemia.   Other details are recorded on the typed history and H and P.   HOSPITAL COURSE: The patient was brought in through the outpatient surgery department. She did have a preop prophylactic antibiotic. She had a laparotomy with findings of a severe adhesive peritonitis and did have a small bowel perforation during the course of the procedure, which was repaired but it was determined during the procedure that it was unsafe to proceed with the operation due to the magnitude and density of the adhesions.   Postoperatively she was treated with analgesics, IV fluids, subcutaneous heparin and progressed satisfactorily. She was begun on a liquid diet and gradually advanced, tolerated well, and ambulated well. Her wound progressed satisfactorily.   FINAL DIAGNOSES: 1.  History of diverticulitis.  2.  Adhesive peritonitis.  3.  Small bowel perforation.   OPERATION: Laparotomy with lysis of adhesions and repair of small bowel perforation.   Wound care instructions given and plans were made for follow-up in the office.   ____________________________ J. Rochel Brome,  MD jws:dp D: 12/26/2012 09:43:44 ET T: 12/26/2012 10:09:17 ET JOB#: [id number]  cc: Loreli Dollar, MD, <Dictator> Loreli Dollar MD ELECTRONICALLY SIGNED 12/29/2012 9:06

## 2014-10-01 NOTE — Consult Note (Signed)
PATIENT NAME:  [name] [name], [name] [name] MR#:  [id number] DATE OF BIRTH:  02/05/1934  DATE OF CONSULTATION:  12/09/2012  REFERRING PHYSICIAN:  Dr. Rochel Brome CONSULTING PHYSICIAN:  Albertine Patricia, MD  PRIMARY CARE PHYSICIAN:  Dr. Einar Pheasant.    REASON FOR MEDICAL CONSULTATION:  Chest pain.   HISTORY OF PRESENT ILLNESS:  This is a 81 year old female with significant past medical history of hypertension, hyperlipidemia, glaucoma, ovarian cancer, with a history of diverticulitis with abscess and colovesicular fistula formation, presents with adhesive peritonitis and perforation of the small bowel, was operated today by Dr. Rochel Brome status post laparotomy and lysis of adhesions and repair of her small bowel perforation by Dr. Tamala Julian, medical consult was called for chest pain, the patient had chest pain this afternoon, around 6:00 p.m., she describes it as one episode, never had such chest pain before, describe it she says it is because of lying on the same side of the bed for some time, but pain resolved with pain meds, the patient denies any chest pain, any diaphoresis, any palpitations, lightheadedness accompanying the chest pain, the patient had 1 troponin done so far which was negative, the patient had EKG changes, which did show T wave inversion in the lateral leads.  Last EKG was done in December 2013 which when we compared we are unaware of any other EKGs done after that, the patient denies any cardiac histories or denies any cardiac work-up done in the past, as well she does report she does not have any cardiologist, reports currently she is chest pain-free, she has musculoskeletal chest pain on palpation, but reports this is different kind of pain, her postoperative pain as well was nonradiating, described as pressure-like quality, the patient is on 1 liter of nasal cannula, the patient denies any pleuritic chest pain or leg swelling.   PAST MEDICAL HISTORY: 1.  Hypertension.  2.   Hyperlipidemia.  3.  Endometriosis.  4.  Fibrocystic breast disease.  5.  History of glaucoma.   6.  History of ovarian cancer. 7.  Sinusitis.  8.  Stress incontinence.   PAST SURGICAL HISTORY: 1.  Left breast papilloma removal.  2.  Sinus surgery 2005, 2006.  3.  Total abdominal hysterectomy with bilateral salpingo-oophorectomy with staging for ovarian cancer in 2002.  4.  Sigmoid colectomy with extensive lysis of adhesions and creation of colostomy in December 2013.  5.  CT-guided abscess drainage in December 2013.   ALLERGIES:  AVELOX, STATIN DUE TO MYALGIA.   SOCIAL HISTORY:  No history of smoking, drinking.   FAMILY HISTORY:  Significant for uterine cancer and bladder cancer.  No coronary artery disease.   HOME MEDICATIONS: 1.  Hydrochlorothiazide/irbesartan 12.5/300 mg oral daily.  2.  Caltrate 600 with vitamin D oral 2 times a day.   CURRENT INPATIENT MEDICATIONS:    1.  Tylenol as needed.  2.  Norco as needed.  3.  SubQ heparin 5000 units every 12 hours.  4.  Hydrochlorothiazide 12.5 mg oral daily.  5.  Irbesartan 300 mg oral daily.  6.  Morphine as needed.  7.  Sublingual nitro as needed.  8.  Zofran as needed.   REVIEW OF SYSTEMS:  CONSTITUTIONAL:  The patient denies fever, chills, fatigue.  Denies weight gain, weight loss.  EYES:  Denies blurry vision, double vision, inflammation.  Has history of glaucoma.  EARS, NOSE, THROAT:  Denies tinnitus, ear pain, epistaxis or discharge.  RESPIRATORY:  Denies cough, wheezing, hemoptysis, dyspnea or COPD.  CARDIOVASCULAR:  Had episode of chest pain, currently resolved.  Denies any edema, arrhythmia, palpitations, syncope.  GASTROINTESTINAL:  Denies nausea, vomiting, jaundice, coffee-ground emesis.  GENITOURINARY:  Denies dysuria, hematuria, renal colic.  ENDOCRINE:  Denies polyuria, polydipsia, heat or cold intolerance.  HEMATOLOGY:  Denies anemia, easy bruising, bleeding diathesis.  INTEGUMENT:  Denies acne, rash or skin  lesions.  MUSCULOSKELETAL:  Denies any gout, arthritis or cramps.  NEUROLOGIC:  Denies vertigo, ataxia, dementia, headache, CVA, TIA or seizures.  PSYCHIATRIC:  Denies anxiety, insomnia, bipolar disorder, schizophrenia or nervousness.   PHYSICAL EXAMINATION: VITAL SIGNS:  Temperature 98.3, pulse 65, respiratory rate 18, blood pressure 129/68, saturating 95% on 1 liter nasal cannula.  GENERAL:  Well-nourished female who looks comfortable in bed in no apparent distress.  HEENT:  Head atraumatic, normocephalic.  Pupils equal, reactive to light.  Pink conjunctivae.  Anicteric sclerae.  Moist oral mucosa.  NECK:  Supple.  No thyromegaly.  No JVD.  CHEST:  Good air entry bilaterally.  No wheezing, rales, rhonchi.  Has chest tenderness on palpation. CARDIOVASCULAR:  S1, S2 heard.  No rubs, murmur, gallops.  ABDOMEN:  Has a midline surgical scar covered and left abdomen colostomy.  No drainage or bleeding at site, no tenderness.  No rebound, no guarding.  EXTREMITIES:  No edema.  No clubbing.  No cyanosis.  Has TED hose.  PSYCHIATRIC:  Appropriate affect.  Awake, alert x 3.  Intact judgment and insight.  Pleasant. NEUROLOGIC:  Cranial nerves grossly intact.  Motor 5 out of 5.  No focal deficits.    PERTINENT LABORATORY DATA:  The patient has only troponin 0.03.  We will send CBC and BMP.  EKG showing sinus rhythm at 52 beats per minute with T wave inversion in the lateral leads.   ASSESSMENT AND PLAN: 1.  Chest pain, chest pain by her description appears to be nontypical, but given her risk factors and her EKG changes, we will continue to cycle patient's cardiac enzymes, first troponin so far is negative, and given her EKG changes we will repeat EKG in a.m., we will consult cardiology service to see if there is any further work-up indicated at this point, the patient does not have any hypoxia or tachycardia or pleuritic chest pain so pulmonary embolism is very unlikely so this is patient's day #1 during  hospitalization and she is already on DVT prophylaxis, we will discuss with surgery for giving the patient aspirin is appropriate at this point given the fact she had recent surgery and she is still nothing by mouth, as well can do aspirin suppository given the fact she is having colostomy.  2.  Hypertension, acceptable.  Continue with home medication hydrochlorothiazide and irbesartan.  3.  Hyperlipidemia.  The patient is known to have ALLERGY TO [place].  4.  DVT prophylaxis.  SubQ heparin.  5.  Small bowel perforation with adhesions.  This is  post laparotomy with a small bowel perforation and lysis of the adhesions and this is managed by primary surgical team.   Total time spent on medical consult and patient care 50 minutes.    ____________________________ Albertine Patricia, MD dse:ea D: 12/10/2012 00:11:52 ET T: 12/10/2012 01:42:57 ET JOB#: [id number]  cc: Albertine Patricia, MD, <Dictator> DAWOOD Graciela Husbands MD ELECTRONICALLY SIGNED 12/11/2012 0:58

## 2014-10-01 NOTE — Op Note (Signed)
PATIENT NAME:  [name] [name], [name] [name] MR#:  [id number] DATE OF BIRTH:  [birth year]  DATE OF PROCEDURE:  12/09/2012  PREOPERATIVE DIAGNOSIS: History of diverticulitis with abscess and colovesical fistula.   POSTOPERATIVE DIAGNOSES:  History of diverticulitis with abscess and colovesical fistula, adhesive peritonitis, perforation of small bowel.   OPERATION: Laparotomy, lysis of adhesions and repair of small bowel perforation   SURGEON:  Loreli Dollar, MD  ANESTHESIA: General.   INDICATIONS: This 81 year old female has a past history of ovarian cancer, hysterectomy and omentectomy and also a history of diverticulitis with abscess and colovesical fistula, Sigmoid colectomy and Hartmann procedure. It has been some six months postoperative and now returns for colostomy closure. Has had recent colonoscopy with some demonstration of additional diverticulosis.   PROCEDURE IN DETAIL:  The patient was placed on the operating table in the supine position under general anesthesia. Legs were elevated into the lithotomy position. The rigid proctoscope was inserted and aspirated a small amount of mucus from the rectum and advanced the scope up some 14 cm. There were no polyps or tumors seen and additional mucus was aspirated and the scope was gradually withdrawn, seeing no polyps or tumors. There was some mild degree of friability of the rectal mucosa and the scope was removed.   The colostomy bag was removed. The skin around the colostomy was prepared with Betadine and also the perineum and anal area were prepared with Betadine to allow for insertion of Foley catheter and for insertion of the EEA stapler. The abdomen was draped out in a sterile manner.   The colostomy was isolated from the wound by placing cotton gauze over the top of it.  Next, a midline incision was made from just above the umbilicus to the pubic symphysis at the site of an old scar, carried down through subcutaneous tissues extended down  approximately 4 cm to encounter the deep fascia, which was incised. It is noted that upon entering the peritoneal cavity, just about 2 inches below the umbilicus, there was a perforation of the small bowel, draining some clear across clear bile, which was aspirated. This was further dissected and did have surrounding dense adhesions and freed up enough that the direction of the bowel could be demonstrated and was closed with a transversely oriented suture line of interrupted 4-0 Vicryl simple sutures, imbricating the defect. The site was irrigated and aspirated. Hemostasis was intact. Next, further  somewhat tedious dissection was carried out, finding multiple dense adhesions. The small bowel was densely adherent to the overlying abdominal wall. A portion of the fascia, approximately 5 inches in length,  was completely  divided every inch of bowel wall found was densely adherent to bowel on both sides and also bowel was densely adherent to the anterior abdominal wall and with additional dissection,  it appeared that it was a high risk for additional perforation of bowel and decision was made to terminate the procedure as it appeared to be unsafe to proceed The closure site was further examined. There was no leakage from the small bowel. There were portions of small bowel that were freed up enough from the fascia to be able to place sutures. The site was further irrigated with saline solution and aspirated. Hemostasis was intact. Next, the fascia was closed for the full length of the incision with interrupted 0 Maxon figure-of-eight sutures. The wound was again irrigated and then the skin was closed with clips, placing these far enough apart to allow for drainage. The skin surrounding  the stoma was treated with benzoin and colostomy bag was applied.   The patient appeared to be in stable condition and was then prepared for transfer to the recovery room.    ____________________________ Lenna Sciara. Rochel Brome,  MD jws:cc D: 12/09/2012 14:30:37 ET T: 12/09/2012 14:43:47 ET JOB#: [id number]  cc: Loreli Dollar, MD, <Dictator> Loreli Dollar MD ELECTRONICALLY SIGNED 12/15/2012 8:56

## 2014-10-01 NOTE — Discharge Summary (Signed)
PATIENT NAME:  [name] [name], [name] [name] MR#:  [id number] DATE OF BIRTH:  09-11-1933  DATE OF ADMISSION:  05/14/2012 DATE OF DISCHARGE:  06/09/2012  HISTORY OF PRESENT ILLNESS: The patient was admitted emergently with the chief complaint of failure to treat diverticulitis as an outpatient. She had recent abdominal pain and was treated as an outpatient with Augmentin for diverticulitis and, due to failure to have significant improvement, she saw Dr. Vira Agar in the office who ordered a CAT scan, which demonstrated diverticulitis and also a 3.6 x 5 cm fluid collection adjacent to the sigmoid colon and adjacent to the bladder, which appeared to be an abscess.   PAST MEDICAL HISTORY: 1. Hypertension. 2. Hyperlipidemia. 3. History of ovarian cancer and hysterectomy in 2002.   HOSPITAL COURSE: I saw her on December 5th for surgical consultation and we considered the possible role of CT-guided catheter aspiration. However, according to the radiologist, it did not appear to be safe. It was also noted that her white blood count went up as high as 25,000. We elected at this point to continue with antibiotics and discussed potential need for sigmoid colectomy. She did appear to make some progress with respect to her symptoms. She was ambulating well. She was taking some liquids and seemed to tolerate that satisfactorily. She was followed along. She had a repeat CT scan on December 10th and CT images demonstrated air within the bladder, evidence of colovesical fistula. I discussed this with her and recommended surgery, which was carried out on December 12th. She had a sigmoid resection with colostomy and extensive lysis of adhesions. We did find an abscess which was adjacent to the sigmoid colon and the bladder with findings of colovesical fistula. Postoperatively she was continued on antibiotics. She did demonstrate some bowel function later after surgery and was able to go back on a liquid diet, which she appeared to be  tolerating fairly well, and was continued on antibiotics and continued to watch her white blood count. By the 19th, she was advanced to a full liquid diet and later had a follow-up CT scan which showed a large fluid collection, in the left lower quadrant, with air-fluid level. We consulted Dr. Burt Knack and had him do a CT-guided catheter drainage, which drained clear bile. It appeared that she had a probable leak of small bowel, evidence of developing enterocutaneous fistula. She was treated with total parenteral nutrition and kept n.p.o. Over the new few days she did improve, white blood count improved, and did have some bilious drainage from the catheter, in the left lower quadrant, and did continue to demonstrate bowel function and gradually improved. By the 26th, there was only scant drainage from the catheter so we subsequently removed the catheter. She continued to improve with antibiotics with some additional period of observation and was subsequently discharged in satisfactory condition on December 30th.   FINAL DIAGNOSES:  1. Diverticulitis with intraabdominal abscess and colovesical fistula.  2. Enterocutaneous fistula.   OPERATION:  1. Sigmoid colectomy with extensive lysis of adhesions and creation of a colostomy. 2. CT-guided abscess drainage.   DISCHARGE INSTRUCTIONS/FOLLOWUP: Discharge instructions were given, plans to gradually advance her diet and follow up in the office. I also discussed the potential eventual closure of colostomy. ____________________________ Lenna Sciara. Rochel Brome, MD jws:sb D: 06/18/2012 21:22:36 ET T: 06/19/2012 57:01:77 ET JOB#: [id number]  cc: Loreli Dollar, MD, <Dictator> Loreli Dollar MD ELECTRONICALLY SIGNED 06/23/2012 14:00

## 2014-11-18 ENCOUNTER — Other Ambulatory Visit: Payer: Self-pay | Admitting: Internal Medicine

## 2014-11-18 ENCOUNTER — Ambulatory Visit
Admission: RE | Admit: 2014-11-18 | Discharge: 2014-11-18 | Disposition: A | Discharge status: Home / Self Care | Payer: Medicare Other | Source: Ambulatory Visit | Attending: Internal Medicine | Admitting: Internal Medicine

## 2014-11-18 DIAGNOSIS — Z1239 Encounter for other screening for malignant neoplasm of breast: Secondary | ICD-10-CM

## 2014-11-18 DIAGNOSIS — Z1231 Encounter for screening mammogram for malignant neoplasm of breast: Secondary | ICD-10-CM | POA: Insufficient documentation

## 2015-02-17 ENCOUNTER — Ambulatory Visit (INDEPENDENT_AMBULATORY_CARE_PROVIDER_SITE_OTHER): Payer: Medicare Other | Admitting: Internal Medicine

## 2015-02-17 ENCOUNTER — Encounter: Payer: Self-pay | Admitting: Internal Medicine

## 2015-02-17 VITALS — BP 120/70 | HR 59 | Temp 98.2°F | Ht 63.0 in | Wt 170.5 lb

## 2015-02-17 DIAGNOSIS — Z0000 Encounter for general adult medical examination without abnormal findings: Secondary | ICD-10-CM | POA: Diagnosis not present

## 2015-02-17 DIAGNOSIS — I10 Essential (primary) hypertension: Secondary | ICD-10-CM

## 2015-02-17 DIAGNOSIS — Z8543 Personal history of malignant neoplasm of ovary: Secondary | ICD-10-CM

## 2015-02-17 DIAGNOSIS — F439 Reaction to severe stress, unspecified: Secondary | ICD-10-CM

## 2015-02-17 DIAGNOSIS — Z658 Other specified problems related to psychosocial circumstances: Secondary | ICD-10-CM

## 2015-02-17 DIAGNOSIS — Z86010 Personal history of colonic polyps: Secondary | ICD-10-CM

## 2015-02-17 DIAGNOSIS — E7800 Pure hypercholesterolemia, unspecified: Secondary | ICD-10-CM

## 2015-02-17 LAB — COMPREHENSIVE METABOLIC PANEL
ALBUMIN: 3.7 g/dL (ref 3.5–5.2)
ALK PHOS: 100 U/L (ref 39–117)
ALT: 26 U/L (ref 0–35)
AST: 20 U/L (ref 0–37)
BILIRUBIN TOTAL: 0.5 mg/dL (ref 0.2–1.2)
BUN: 11 mg/dL (ref 6–23)
CALCIUM: 9 mg/dL (ref 8.4–10.5)
CO2: 31 mEq/L (ref 19–32)
Chloride: 106 mEq/L (ref 96–112)
Creatinine, Ser: 0.67 mg/dL (ref 0.40–1.20)
GFR: 90.26 mL/min (ref >60.00)
Glucose, Bld: 91 mg/dL (ref 70–99)
Potassium: 4.2 mEq/L (ref 3.5–5.1)
Sodium: 142 mEq/L (ref 135–145)
TOTAL PROTEIN: 6.8 g/dL (ref 6.0–8.3)

## 2015-02-17 LAB — CBC WITH DIFFERENTIAL/PLATELET
BASOS ABS: 0 10*3/uL (ref 0.0–0.1)
Basophils Relative: 0.4 % (ref 0.0–3.0)
Eosinophils Absolute: 0 10*3/uL (ref 0.0–0.7)
Eosinophils Relative: 0.3 % (ref 0.0–5.0)
HEMATOCRIT: 40.8 % (ref 36.0–46.0)
HEMOGLOBIN: 13.5 g/dL (ref 12.0–15.0)
LYMPHS PCT: 19.4 % (ref 12.0–46.0)
Lymphs Abs: 1.9 10*3/uL (ref 0.7–4.0)
MCHC: 33 g/dL (ref 30.0–36.0)
MCV: 91.5 fl (ref 78.0–100.0)
MONOS PCT: 7.3 % (ref 3.0–12.0)
Monocytes Absolute: 0.7 10*3/uL (ref 0.1–1.0)
NEUTROS ABS: 7.2 10*3/uL (ref 1.4–7.7)
Neutrophils Relative %: 72.6 % (ref 43.0–77.0)
PLATELETS: 297 10*3/uL (ref 150.0–400.0)
RBC: 4.45 Mil/uL (ref 3.87–5.11)
RDW: 14.4 % (ref 11.5–15.5)
WBC: 9.9 10*3/uL (ref 4.0–10.5)

## 2015-02-17 LAB — LIPID PANEL
CHOL/HDL RATIO: 3
CHOLESTEROL: 135 mg/dL (ref 0–200)
HDL: 43.3 mg/dL (ref >39.00)
LDL CALC: 68 mg/dL (ref 0–99)
NONHDL: 91.8
TRIGLYCERIDES: 118 mg/dL (ref 0.0–149.0)
VLDL: 23.6 mg/dL (ref 0.0–40.0)

## 2015-02-17 LAB — TSH: TSH: 1.12 u[IU]/mL (ref 0.35–4.50)

## 2015-02-17 NOTE — Progress Notes (Signed)
Pre-visit discussion using our clinic review tool. No additional management support is needed unless otherwise documented below in the visit note.  

## 2015-02-17 NOTE — Progress Notes (Signed)
Patient ID: [name] [name], female   DOB: Jun 04, 1934, 81 y.o.   MRN: [id number]   Subjective:    Patient ID: [name] [name], female    DOB: [birth year], 81 y.o.   MRN: [id number]  HPI  Patient here to follow up on current medical issues as well as for a complete physical exam.  She is walking more.  Walking her dog.  Has lost weight.  Is eating.  No nausea or vomiting.  Bowels doing well.  No cardiac symptoms with increased activity or exertion.  No sob.  Handling stress.  Feels better.    Past Medical History  Diagnosis Date  . Hypertension   . Hyperlipidemia   . Endometriosis   . Fibrocystic breast disease   . Glaucoma   . Sinusitis   . History of chicken pox   . Diverticulitis of sigmoid colon   . History of colon polyps   . Tuberculosis     positve skin test  . H/O urinary incontinence   . Cancer 2002    ovarian. chemo tx.    Past Surgical History  Procedure Laterality Date  . Nasal sinus surgery      For fungal infection  . Ovarian cancer      Exploratory lap  . Nasal sinus surgery  8/05 8/06  . Breast surgery Left 1982    papilloma removal  . Appendectomy  2003  . Tonsillectomy and adenoidectomy  1947  . Abdominal hysterectomy  2003    complete  . Ostomy  2012    diverticulitis with abcess  . Breast biopsy Left 1993    excisional bx. negative results   Family History  Problem Relation Age of Onset  . Hypertension Mother   . Coronary artery disease Mother   . Heart disease Mother   . Kidney disease Mother   . Diabetes Mother   . Cancer Mother     uterine cancer  . Diabetes Father   . Kidney failure Father   . Diabetes Brother   . Coronary artery disease Brother   . Cancer Brother     prostate cancer  . Stroke Sister   . Cancer Maternal Aunt     ovarian cancer   Social History   Social History  . Marital Status: Married    Spouse Name: N/A  . Number of Children: 0  . Years of Education: N/A   Social History Main Topics  . Smoking status:  Never Smoker   . Smokeless tobacco: Never Used  . Alcohol Use: No  . Drug Use: No  . Sexual Activity: Not Asked   Other Topics Concern  . None   Social History Narrative    Outpatient Encounter Prescriptions as of 02/17/2015  Medication Sig  . Calcium Carbonate-Vit D-Min (CALCIUM 600 + MINERALS) 600-200 MG-UNIT TABS Take by mouth once. Calcium 600 + Minerals (calcium carbonate-vit d3-min) 600 mg calcium- 200 unit   No facility-administered encounter medications on file as of 02/17/2015.    Review of Systems  Constitutional: Negative for appetite change.       Weight is down from increased walking.   HENT: Negative for congestion and sinus pressure.   Eyes: Negative for pain and visual disturbance.  Respiratory: Negative for cough, chest tightness and shortness of breath.   Cardiovascular: Negative for chest pain, palpitations and leg swelling.  Gastrointestinal: Negative for nausea, vomiting, abdominal pain and diarrhea.  Genitourinary: Negative for frequency and difficulty urinating.  Musculoskeletal: Negative for back pain  and joint swelling.  Skin: Negative for color change and rash.  Neurological: Negative for dizziness, light-headedness and headaches.  Hematological: Negative for adenopathy. Does not bruise/bleed easily.  Psychiatric/Behavioral: Negative for dysphoric mood and agitation.       Objective:     Blood pressure rechecked by me:  128/82  Physical Exam  Constitutional: She is oriented to person, place, and time. She appears well-developed and well-nourished.  HENT:  Nose: Nose normal.  Mouth/Throat: Oropharynx is clear and moist.  Eyes: Right eye exhibits no discharge. Left eye exhibits no discharge. No scleral icterus.  Neck: Neck supple. No thyromegaly present.  Cardiovascular: Normal rate and regular rhythm.   DP pulses palpable and equal bilaterally.   Pulmonary/Chest: Breath sounds normal. No accessory muscle usage. No tachypnea. No respiratory  distress. She has no decreased breath sounds. She has no wheezes. She has no rhonchi. Right breast exhibits no inverted nipple, no mass, no nipple discharge and no tenderness (no axillary adenopathy). Left breast exhibits no inverted nipple, no mass, no nipple discharge and no tenderness (no axilarry adenopathy).  Abdominal: Soft. Bowel sounds are normal. There is no tenderness.  Musculoskeletal: She exhibits no edema or tenderness.  Lymphadenopathy:    She has no cervical adenopathy.  Neurological: She is alert and oriented to person, place, and time.  Skin: Skin is warm. No rash noted. No erythema.  Psychiatric: She has a normal mood and affect. Her behavior is normal.    BP 120/70 mmHg  Pulse 59  Temp(Src) 98.2 F (36.8 C) (Oral)  Ht 5\' 3"  (1.6 m)  Wt 170 lb 8 oz (77.338 kg)  BMI 30.21 kg/m2  SpO2 96% Wt Readings from Last 3 Encounters:  02/17/15 170 lb 8 oz (77.338 kg)  09/16/14 179 lb 8 oz (81.421 kg)  07/15/14 176 lb (79.833 kg)     Lab Results  Component Value Date   WBC 9.9 02/17/2015   HGB 13.5 02/17/2015   HCT 40.8 02/17/2015   PLT 297.0 02/17/2015   GLUCOSE 91 02/17/2015   CHOL 135 02/17/2015   TRIG 118.0 02/17/2015   HDL 43.30 02/17/2015   LDLCALC 68 02/17/2015   ALT 26 02/17/2015   AST 20 02/17/2015   NA 142 02/17/2015   K 4.2 02/17/2015   CL 106 02/17/2015   CREATININE 0.67 02/17/2015   BUN 11 02/17/2015   CO2 31 02/17/2015   TSH 1.12 02/17/2015   INR 1.0 05/21/2012    Mm Screening Breast Tomo Bilateral  11/19/2014   CLINICAL DATA:  Screening.  EXAM: DIGITAL SCREENING BILATERAL MAMMOGRAM WITH 3D TOMO WITH CAD  COMPARISON:  Previous exam(s).  ACR Breast Density Category c: The breast tissue is heterogeneously dense, which may obscure small masses.  FINDINGS: There are no findings suspicious for malignancy. Images were processed with CAD.  IMPRESSION: No mammographic evidence of malignancy. A result letter of this screening mammogram will be mailed  directly to the patient.  RECOMMENDATION: Screening mammogram in one year. (Code:SM-B-01Y)  BI-RADS CATEGORY  1: Negative.   Electronically Signed   By: Lovey Newcomer M.D.   On: 11/19/2014 09:43       Assessment & Plan:   Problem List Items Addressed This Visit    Essential hypertension, benign    On no medication.  Blood pressure doing well.  Follow.       Health care maintenance    Physical today (02/17/15).  Mammogram 11/19/14 - Birads I.  Colonoscopy 10/16/12.  History of colonic polyps    Colonoscopy 10/16/12.       History of ovarian cancer    Has previously been followed at [facility].  They have released her (> 10 years out).  Stated no further w/up warranted.        Relevant Orders   CBC with Differential/Platelet (Completed)   Comprehensive metabolic panel (Completed)   TSH (Completed)   Hypercholesterolemia - Primary    Low cholesterol diet and exercise.  Follow lipid panel.        Relevant Orders   Lipid panel (Completed)   Stress    Doing better.  Walking.  Handling things well.  Follow.            Einar Pheasant, MD

## 2015-02-18 ENCOUNTER — Encounter: Payer: Self-pay | Admitting: Internal Medicine

## 2015-02-20 ENCOUNTER — Encounter: Payer: Self-pay | Admitting: Internal Medicine

## 2015-02-20 NOTE — Assessment & Plan Note (Signed)
Colonoscopy 10/16/12.   

## 2015-02-20 NOTE — Assessment & Plan Note (Signed)
Has previously been followed at [facility].  They have released her (> 10 years out).  Stated no further w/up warranted.

## 2015-02-20 NOTE — Assessment & Plan Note (Signed)
Low cholesterol diet and exercise.  Follow lipid panel.   

## 2015-02-20 NOTE — Assessment & Plan Note (Signed)
Doing better.  Walking.  Handling things well.  Follow.

## 2015-02-20 NOTE — Assessment & Plan Note (Signed)
On no medication.  Blood pressure doing well.  Follow.  

## 2015-02-20 NOTE — Assessment & Plan Note (Signed)
Physical today (02/17/15).  Mammogram 11/19/14 - Birads I.  Colonoscopy 10/16/12.

## 2015-02-22 NOTE — Telephone Encounter (Signed)
Unread mychart message mailed to patient 

## 2015-06-12 HISTORY — PX: ABSCESS DRAINAGE: SHX1119

## 2015-07-07 ENCOUNTER — Encounter: Payer: Self-pay | Admitting: Family Medicine

## 2015-07-07 ENCOUNTER — Ambulatory Visit (INDEPENDENT_AMBULATORY_CARE_PROVIDER_SITE_OTHER): Payer: Medicare HMO | Admitting: Family Medicine

## 2015-07-07 ENCOUNTER — Ambulatory Visit: Payer: Medicare Other | Admitting: Internal Medicine

## 2015-07-07 VITALS — BP 114/72 | HR 76 | Temp 98.7°F | Ht 63.0 in | Wt 172.0 lb

## 2015-07-07 DIAGNOSIS — R195 Other fecal abnormalities: Secondary | ICD-10-CM

## 2015-07-07 DIAGNOSIS — R1031 Right lower quadrant pain: Secondary | ICD-10-CM

## 2015-07-07 DIAGNOSIS — G8929 Other chronic pain: Secondary | ICD-10-CM | POA: Diagnosis not present

## 2015-07-07 DIAGNOSIS — R109 Unspecified abdominal pain: Secondary | ICD-10-CM | POA: Insufficient documentation

## 2015-07-07 NOTE — Patient Instructions (Signed)
Nice to meet you. The discomfort he had could be related to bloating and gas. Concerning aspects are penciling of your stools and the chronic right lower quadrant pain you have had over the last year. We will refer you to GI for evaluation. We will obtain a CT scan of your abdomen and pelvis to evaluate further. If you develop worsening pain, nausea, vomiting, diarrhea, blood in your stool, fevers, or any new or change in symptoms please seek medical attention.

## 2015-07-07 NOTE — Progress Notes (Signed)
Patient ID: [name] [name], female   DOB: 07-Jul-1933, 82 y.o.   MRN: [id number]  [name] Rumps, MD Phone: [phone]  [name] [name] is a 82 y.o. female who presents today for same-day visit.  Patient notes several days ago she developed lower abdominal discomfort described as a nagging ache. She notes this stopped and then she developed bloating and gas. She denies abdominal pain at this time. She notes she took milk of magnesia and this helped with her symptoms. She's been passing gas. She notes a history of diverticulitis with partial colectomy. She has had an appendectomy previously. She has a history of ovarian cancer and is status post hysterectomy and bilateral salpingo-oophorectomy. She additionally notes over the last year she has had right lower quadrant abdominal discomfort intermittently. It is a gnawing discomfort. She is also having pencillike bowel movements every time she goes to the bathroom. Notes she has bowel movements daily. She is passing gas. No blood in her stool. No vomiting. Some nausea. No abdominal pain or nausea at this time.  PMH: nonsmoker.   ROS see history of present illness  Objective  Physical Exam Filed Vitals:   07/07/15 1110  BP: 114/72  Pulse: 76  Temp: 98.7 F (37.1 C)    BP Readings from Last 3 Encounters:  07/07/15 114/72  02/17/15 120/70  09/16/14 130/70   Wt Readings from Last 3 Encounters:  07/07/15 172 lb (78.019 kg)  02/17/15 170 lb 8 oz (77.338 kg)  09/16/14 179 lb 8 oz (81.421 kg)    Physical Exam  Constitutional: She is well-developed, well-nourished, and in no distress.  HENT:  Head: Atraumatic.  Cardiovascular: Normal rate, regular rhythm and normal heart sounds.  Exam reveals no gallop and no friction rub.   No murmur heard. Pulmonary/Chest: Effort normal and breath sounds normal. No respiratory distress. She has no wheezes. She has no rales.  Abdominal: Soft. Bowel sounds are normal. She exhibits no distension.  There is no tenderness. There is no rebound and no guarding.  Musculoskeletal: She exhibits no edema.  Neurological: She is alert. Gait normal.  Skin: Skin is warm and dry. She is not diaphoretic.     Assessment/Plan: Please see individual problem list.  Abdominal pain Patient with abdominal discomfort associated with bloating and gas. Abdominal discomfort has resolved. She has chronic intermittent right lower quadrant abdominal pain for the last year. Also reports penciling of her stools. The concern with her history of cancer would be recurrence of her cancer, though with penciling of stools concern could also be for mass lesion of the colon. Unlikely to be diverticulitis given lack of discomfort on exam today. Unlikely to be obstructed given that she is passing stool and gas. She had normal bowel sounds. She has a benign abdominal exam. Given history we will obtain a CT scan of her abdomen and pelvis to evaluate for cause. Given penciling of stools we will refer her to GI for further evaluation. She is given return precautions.    Orders Placed This Encounter  Procedures  . CT Abdomen Pelvis W Contrast    Standing Status: Future     Number of Occurrences:      Standing Expiration Date: 10/04/2016    Order Specific Question:  If indicated for the ordered procedure, I authorize the administration of contrast media per Radiology protocol    Answer:  Yes    Order Specific Question:  Reason for Exam (SYMPTOM  OR DIAGNOSIS REQUIRED)    Answer:  intermittent RLQ abdominal pain for past year, history of ovarian cancer, s/p appendectomy, penciling of stools    Order Specific Question:  Preferred imaging location?    Answer:  [facility]  . Ambulatory referral to Gastroenterology    Referral Priority:  Routine    Referral Type:  Consultation    Referral Reason:  Specialty Services Required    Number of Visits Requested:  1    [name] [name]

## 2015-07-07 NOTE — Assessment & Plan Note (Signed)
Patient with abdominal discomfort associated with bloating and gas. Abdominal discomfort has resolved. She has chronic intermittent right lower quadrant abdominal pain for the last year. Also reports penciling of her stools. The concern with her history of cancer would be recurrence of her cancer, though with penciling of stools concern could also be for mass lesion of the colon. Unlikely to be diverticulitis given lack of discomfort on exam today. Unlikely to be obstructed given that she is passing stool and gas. She had normal bowel sounds. She has a benign abdominal exam. Given history we will obtain a CT scan of her abdomen and pelvis to evaluate for cause. Given penciling of stools we will refer her to GI for further evaluation. She is given return precautions.

## 2015-07-07 NOTE — Progress Notes (Signed)
Pre visit review using our clinic review tool, if applicable. No additional management support is needed unless otherwise documented below in the visit note. 

## 2015-07-08 ENCOUNTER — Telehealth: Payer: Self-pay

## 2015-07-08 DIAGNOSIS — R1084 Generalized abdominal pain: Secondary | ICD-10-CM

## 2015-07-08 NOTE — Telephone Encounter (Signed)
Order placed for labs.

## 2015-07-08 NOTE — Telephone Encounter (Signed)
[facility] called  needs lab orders. Creatinie  And BUN with contrast

## 2015-07-11 ENCOUNTER — Telehealth: Payer: Self-pay

## 2015-07-11 ENCOUNTER — Telehealth: Payer: Self-pay | Admitting: Internal Medicine

## 2015-07-11 ENCOUNTER — Ambulatory Visit
Admission: RE | Admit: 2015-07-11 | Discharge: 2015-07-11 | Disposition: A | Discharge status: Home / Self Care | Payer: Medicare HMO | Source: Ambulatory Visit | Attending: Family Medicine | Admitting: Family Medicine

## 2015-07-11 ENCOUNTER — Ambulatory Visit: Payer: Medicare Other | Admitting: Internal Medicine

## 2015-07-11 ENCOUNTER — Encounter: Payer: Self-pay | Admitting: *Deleted

## 2015-07-11 DIAGNOSIS — R1084 Generalized abdominal pain: Secondary | ICD-10-CM

## 2015-07-11 DIAGNOSIS — Z90710 Acquired absence of both cervix and uterus: Secondary | ICD-10-CM | POA: Diagnosis not present

## 2015-07-11 DIAGNOSIS — R1031 Right lower quadrant pain: Secondary | ICD-10-CM | POA: Diagnosis not present

## 2015-07-11 DIAGNOSIS — R1030 Lower abdominal pain, unspecified: Secondary | ICD-10-CM | POA: Diagnosis not present

## 2015-07-11 DIAGNOSIS — Z0189 Encounter for other specified special examinations: Secondary | ICD-10-CM | POA: Insufficient documentation

## 2015-07-11 DIAGNOSIS — K651 Peritoneal abscess: Secondary | ICD-10-CM

## 2015-07-11 DIAGNOSIS — G8929 Other chronic pain: Secondary | ICD-10-CM | POA: Diagnosis not present

## 2015-07-11 DIAGNOSIS — Z9049 Acquired absence of other specified parts of digestive tract: Secondary | ICD-10-CM | POA: Diagnosis not present

## 2015-07-11 LAB — CREATININE, SERUM
CREATININE: 0.66 mg/dL (ref 0.44–1.00)
GFR calc Af Amer: >60 mL/min (ref >60)

## 2015-07-11 LAB — BUN: BUN: 13 mg/dL (ref 6–20)

## 2015-07-11 MED ORDER — IOHEXOL 300 MG/ML  SOLN
100.0000 mL | Freq: Once | INTRAMUSCULAR | Status: AC | PRN
  Administered 2015-07-11: 100 mL via INTRAVENOUS

## 2015-07-11 NOTE — Telephone Encounter (Signed)
Reviewed and discussed with Dr Caryl Bis.  See his note.

## 2015-07-11 NOTE — Telephone Encounter (Signed)
Dr. Jamal Collin would like to speak to him about pt. Please call Dr. Jamal Collin at  [phone]

## 2015-07-11 NOTE — Telephone Encounter (Signed)
Spoke with Dr. Jamal Collin regarding patient. Advised of history from office visit last week. Advised of presacral abscess findings on CT scan. Advised the patient appeared well with no abnormal abdominal findings on exam last week and she appeared well. Advised of the patient's past surgical history. Advised that Dr. Tamala Julian had previously seen her and I discussed the case with him and he had operated on her though she declined seeing him. Apologized that I did not call him to discuss this prior to placing the referral. He stated he would request records and review prior to deciding what would need to be done. I thanked him for his help.

## 2015-07-11 NOTE — Telephone Encounter (Signed)
Received result of CT scan. It appears that it shows 2 subacute abscesses in the presacral space near her distal large bowel reanastomosis and possible fistula. Patient's surgeon is Dr. Tamala Julian who last did an operation on her in July 2014. Called him get his opinion on this. I advised of findings of the CT scan. Gave the patient's name and date of birth. I also discussed the reasons for the patient's office visit and the reason for obtaining the CT scan. Advised that she had had intermittent right lower quadrant abdominal discomfort for the last year and had some bloating and gas last week. She has a history of ovarian cancer and is status post hysterectomy and BSO. Advised Dr. Tamala Julian that these were the reasons that I obtained a scan looking for recurrence. Dr. Tamala Julian wondered whether or not this process had been present for some time as the patient had not been febrile and not had any acute pain. He advised follow-up in the office for this issue as it did not appear to be an acute issue. I asked if he would like to see her today and he said that likely not be possible and that she can follow-up on another day. I asked if she needed antibiotics and he advised that he did not think so. I called the patient to inform her of the findings and that I spoke with Dr. Tamala Julian. Patient denies abdominal pain and fevers at this time. She does report over the last year or so intermittently having pus on a pad that she wears and she is unsure if this is from her rectum or not. Patient states that she does not want to go back and see Dr. Tamala Julian. She would prefer to see another surgeon in the area. I advised that I could place a referral to a different surgeon if she desired. She would like referral to someone else locally. She is given return precautions.

## 2015-07-11 NOTE — Telephone Encounter (Signed)
[facility] called with findings concerning for subacute abcesses in presacral space near distal large bowl reanastomosis. Possible enterocolonic fistula. Results are also on epic

## 2015-07-14 ENCOUNTER — Telehealth: Payer: Self-pay | Admitting: Internal Medicine

## 2015-07-14 NOTE — Telephone Encounter (Signed)
Pt called stating that [facility] would like office notes from when pt saw Dr Caryl Bis on 07/07/2015. Pt states that [facility] is sending a request wanting office notes it should be faxed this morning per pt. [facility] fax number [phone]. Thank you!

## 2015-07-14 NOTE — Telephone Encounter (Signed)
Paperwork Faxed to [employer]

## 2015-07-19 ENCOUNTER — Ambulatory Visit: Payer: Self-pay | Admitting: General Surgery

## 2015-07-19 NOTE — Telephone Encounter (Signed)
LM for patient to return call.

## 2015-07-19 NOTE — Telephone Encounter (Signed)
Can you contact the patient to see if she has gotten in to see a surgeon yet? Thanks.

## 2015-07-19 NOTE — Telephone Encounter (Signed)
Spoke with patient and she has not seen surgery yet. She has an appointment with [facility] on 08/01/15.

## 2015-07-20 NOTE — Telephone Encounter (Signed)
Noted  

## 2015-08-01 DIAGNOSIS — N739 Female pelvic inflammatory disease, unspecified: Secondary | ICD-10-CM | POA: Diagnosis not present

## 2015-08-09 DIAGNOSIS — Z5181 Encounter for therapeutic drug level monitoring: Secondary | ICD-10-CM | POA: Diagnosis not present

## 2015-08-09 DIAGNOSIS — N739 Female pelvic inflammatory disease, unspecified: Secondary | ICD-10-CM | POA: Diagnosis not present

## 2015-08-09 DIAGNOSIS — Z4682 Encounter for fitting and adjustment of non-vascular catheter: Secondary | ICD-10-CM | POA: Diagnosis not present

## 2015-08-09 DIAGNOSIS — K611 Rectal abscess: Secondary | ICD-10-CM | POA: Diagnosis not present

## 2015-08-17 ENCOUNTER — Ambulatory Visit (INDEPENDENT_AMBULATORY_CARE_PROVIDER_SITE_OTHER): Payer: Medicare HMO | Admitting: Internal Medicine

## 2015-08-17 ENCOUNTER — Encounter: Payer: Self-pay | Admitting: Internal Medicine

## 2015-08-17 VITALS — BP 130/70 | HR 60 | Temp 97.8°F | Resp 18 | Ht 63.0 in | Wt 168.5 lb

## 2015-08-17 DIAGNOSIS — L0291 Cutaneous abscess, unspecified: Secondary | ICD-10-CM | POA: Diagnosis not present

## 2015-08-17 DIAGNOSIS — I10 Essential (primary) hypertension: Secondary | ICD-10-CM

## 2015-08-17 DIAGNOSIS — Z8543 Personal history of malignant neoplasm of ovary: Secondary | ICD-10-CM | POA: Diagnosis not present

## 2015-08-17 DIAGNOSIS — E7800 Pure hypercholesterolemia, unspecified: Secondary | ICD-10-CM

## 2015-08-17 NOTE — Progress Notes (Signed)
Pre-visit discussion using our clinic review tool. No additional management support is needed unless otherwise documented below in the visit note.  

## 2015-08-17 NOTE — Progress Notes (Signed)
Patient ID: [name] [name], female   DOB: 28-Dec-1933, 82 y.o.   MRN: [id number]   Subjective:    Patient ID: [name] [name], female    DOB: Dec 25, 1933, 82 y.o.   MRN: [id number]  HPI  Patient with past history of hypercholesterolemia, ovarian cancer and hypertension.  She comes in today to follow up on these issues.  She reports she is doing relatively well.  Recently found to have subacute abscess in the presacral space.  Being followed at [facility]  Has drain in place.  States has barium enema scheduled for next week.  Eating and drinking.  Bowels moving.  No fever.  Continues f/u with [facility].  Breathing stable.     Past Medical History  Diagnosis Date  . Hypertension   . Hyperlipidemia   . Endometriosis   . Fibrocystic breast disease   . Glaucoma   . Sinusitis   . History of chicken pox   . Diverticulitis of sigmoid colon   . History of colon polyps   . Tuberculosis     positve skin test  . H/O urinary incontinence   . Cancer ([place]) 2002    ovarian. chemo tx.    Past Surgical History  Procedure Laterality Date  . Nasal sinus surgery      For fungal infection  . Ovarian cancer      Exploratory lap  . Nasal sinus surgery  8/05 8/06  . Breast surgery Left 1982    papilloma removal  . Appendectomy  2003  . Tonsillectomy and adenoidectomy  1947  . Abdominal hysterectomy  2003    complete  . Ostomy  2012    diverticulitis with abcess  . Breast biopsy Left 1993    excisional bx. negative results   Family History  Problem Relation Age of Onset  . Hypertension Mother   . Coronary artery disease Mother   . Heart disease Mother   . Kidney disease Mother   . Diabetes Mother   . Cancer Mother     uterine cancer  . Diabetes Father   . Kidney failure Father   . Diabetes Brother   . Coronary artery disease Brother   . Cancer Brother     prostate cancer  . Stroke Sister   . Cancer Maternal Aunt     ovarian cancer   Social History   Social History  . Marital Status:  Married    Spouse Name: N/A  . Number of Children: 0  . Years of Education: N/A   Social History Main Topics  . Smoking status: Never Smoker   . Smokeless tobacco: Never Used  . Alcohol Use: No  . Drug Use: No  . Sexual Activity: Not Asked   Other Topics Concern  . None   Social History Narrative    Outpatient Encounter Prescriptions as of 08/17/2015  Medication Sig  . Calcium Carbonate-Vit D-Min (CALCIUM 600 + MINERALS) 600-200 MG-UNIT TABS Take by mouth once. Calcium 600 + Minerals (calcium carbonate-vit d3-min) 600 mg calcium- 200 unit   No facility-administered encounter medications on file as of 08/17/2015.    Review of Systems  Constitutional: Negative for appetite change and unexpected weight change.  HENT: Negative for congestion and sinus pressure.   Respiratory: Negative for cough, chest tightness and shortness of breath.   Cardiovascular: Negative for chest pain, palpitations and leg swelling.  Gastrointestinal: Negative for nausea, vomiting and diarrhea.  Genitourinary: Negative for dysuria and difficulty urinating.  Musculoskeletal: Negative for back pain  and joint swelling.  Skin: Negative for color change and rash.  Neurological: Negative for dizziness and light-headedness.  Psychiatric/Behavioral: Negative for dysphoric mood and agitation.       Objective:    Physical Exam  Constitutional: She appears well-developed and well-nourished. No distress.  HENT:  Nose: Nose normal.  Mouth/Throat: Oropharynx is clear and moist.  Eyes: Conjunctivae are normal. Right eye exhibits no discharge. Left eye exhibits no discharge.  Neck: Neck supple. No thyromegaly present.  Cardiovascular: Normal rate and regular rhythm.   Pulmonary/Chest: Breath sounds normal. No respiratory distress. She has no wheezes.  Abdominal: Soft. Bowel sounds are normal. There is no tenderness.  Drain in place.    Musculoskeletal: She exhibits no edema or tenderness.  Lymphadenopathy:     She has no cervical adenopathy.  Skin: No rash noted. No erythema.  Psychiatric: She has a normal mood and affect. Her behavior is normal.    BP 130/70 mmHg  Pulse 60  Temp(Src) 97.8 F (36.6 C) (Oral)  Resp 18  Ht 5\' 3"  (1.6 m)  Wt 168 lb 8 oz (76.431 kg)  BMI 29.86 kg/m2  SpO2 95% Wt Readings from Last 3 Encounters:  08/17/15 168 lb 8 oz (76.431 kg)  07/07/15 172 lb (78.019 kg)  02/17/15 170 lb 8 oz (77.338 kg)     Lab Results  Component Value Date   WBC 9.9 02/17/2015   HGB 13.5 02/17/2015   HCT 40.8 02/17/2015   PLT 297.0 02/17/2015   GLUCOSE 91 02/17/2015   CHOL 135 02/17/2015   TRIG 118.0 02/17/2015   HDL 43.30 02/17/2015   LDLCALC 68 02/17/2015   ALT 26 02/17/2015   AST 20 02/17/2015   NA 142 02/17/2015   K 4.2 02/17/2015   CL 106 02/17/2015   CREATININE 0.66 07/11/2015   BUN 13 07/11/2015   CO2 31 02/17/2015   TSH 1.12 02/17/2015   INR 1.0 05/21/2012    Ct Abdomen Pelvis W Contrast  07/11/2015  CLINICAL DATA:  Intermittent right lower quadrant pain for 1 year, history of hysterectomy appendectomy and prior colon resection for diverticulitis EXAM: CT ABDOMEN AND PELVIS WITH CONTRAST TECHNIQUE: Multidetector CT imaging of the abdomen and pelvis was performed using the standard protocol following bolus administration of intravenous contrast. CONTRAST:  156mL OMNIPAQUE IOHEXOL 300 MG/ML  SOLN COMPARISON:  Multiple prior studies FINDINGS: Lower chest: When compared to most recent study of 06/03/2012 there is stable 2 mm nodule lateral left lung base on image 1. This is therefore considered to be benign. There is a new 3 mm nodule in the lateral left lung base on image 7. There is mild bilateral lower lobe atelectasis. Hepatobiliary: Mild diffuse hepatic steatosis. Lamellated 4 cm gallstone. Pancreas: Normal Spleen: Normal Adrenals/Urinary Tract: Adrenal glands are negative. Kidneys are negative. Bladder is negative. Stomach/Bowel: There is a nonobstructive bowel gas  pattern. Prior ostomy left lower quadrant seen on 06/03/2012 study no longer appears to be present. There is evidence of left colon anastomosis. Midline low pelvic anterior wall abdominal hernia measures 2.5 cm in diameter and contains a loop of distal small bowel the does not show evidence of obstruction or strangulation. Transverse anterior abdominal wall musculature dehiscence noted in the periumbilical region. There is evidence of bowel anastomosis in the right upper quadrant. Vascular/Lymphatic: Extensive aortoiliac calcification Reproductive: Reproductive organs not identified. Other: In the presacral there is inflammatory change with a rim enhancing fluid collection that measures 5.3 x 4.8 x 5.9 cm. Along the anterior  nondependent wall of this collection is a 1 x 2 cm hyper attenuating nodule that is either enhancing or composed of oral contrast. Immediately anterior and adjacent to this feature passes the distal ileum. There are a few foci of punctate calcification in the wall of this structure. Immediately adjacent and to the left of this is another similar rim enhancing fluid collection measuring 24 by 17 mm. These were not present on 06/03/2012. Musculoskeletal: No acute findings IMPRESSION: Findings concerning for subacute abscesses in the presacral space near distal large bowel reanastomosis. Possible enterocolonic fistula. These results will be called to the ordering clinician or representative by the Radiologist Assistant, and communication documented in the PACS or zVision Dashboard. Electronically Signed   By: Skipper Cliche M.D.   On: 07/11/2015 10:10       Assessment & Plan:   Problem List Items Addressed This Visit    Abscess    Found to have presacral abscess on CT.  Being followed at [facility].  Drain in place.  Has f/u planned next week.  Continue f/u at [facility].        Essential hypertension, benign - Primary    Blood pressure doing well on no medication.  Follow.        History of  ovarian cancer    Recent CT as outlined.        Hypercholesterolemia    Low cholesterol diet and exercise.  Follow lipid panel.            Einar Pheasant, MD

## 2015-08-21 ENCOUNTER — Encounter: Payer: Self-pay | Admitting: Internal Medicine

## 2015-08-21 DIAGNOSIS — L0291 Cutaneous abscess, unspecified: Secondary | ICD-10-CM | POA: Insufficient documentation

## 2015-08-21 NOTE — Assessment & Plan Note (Signed)
Blood pressure doing well on no medication.  Follow.   

## 2015-08-21 NOTE — Assessment & Plan Note (Signed)
Recent CT as outlined.

## 2015-08-21 NOTE — Assessment & Plan Note (Signed)
Low cholesterol diet and exercise.  Follow lipid panel.   

## 2015-08-21 NOTE — Assessment & Plan Note (Signed)
Found to have presacral abscess on CT.  Being followed at [facility].  Drain in place.  Has f/u planned next week.  Continue f/u at [facility].

## 2015-08-23 DIAGNOSIS — N739 Female pelvic inflammatory disease, unspecified: Secondary | ICD-10-CM | POA: Diagnosis not present

## 2015-08-23 DIAGNOSIS — K611 Rectal abscess: Secondary | ICD-10-CM | POA: Diagnosis not present

## 2015-08-30 ENCOUNTER — Other Ambulatory Visit: Payer: Self-pay | Admitting: Physician Assistant

## 2015-08-30 DIAGNOSIS — K611 Rectal abscess: Secondary | ICD-10-CM

## 2015-09-06 ENCOUNTER — Ambulatory Visit
Admission: RE | Admit: 2015-09-06 | Discharge: 2015-09-06 | Disposition: A | Discharge status: Home / Self Care | Payer: Medicare HMO | Source: Ambulatory Visit | Attending: Physician Assistant | Admitting: Physician Assistant

## 2015-09-06 DIAGNOSIS — K611 Rectal abscess: Secondary | ICD-10-CM | POA: Diagnosis not present

## 2015-09-06 DIAGNOSIS — K8020 Calculus of gallbladder without cholecystitis without obstruction: Secondary | ICD-10-CM | POA: Diagnosis not present

## 2015-09-06 DIAGNOSIS — K760 Fatty (change of) liver, not elsewhere classified: Secondary | ICD-10-CM | POA: Insufficient documentation

## 2015-09-06 DIAGNOSIS — K6289 Other specified diseases of anus and rectum: Secondary | ICD-10-CM | POA: Diagnosis not present

## 2015-09-06 HISTORY — DX: Malignant neoplasm of unspecified ovary: C56.9

## 2015-09-06 LAB — POCT I-STAT CREATININE: Creatinine, Ser: 0.6 mg/dL (ref 0.44–1.00)

## 2015-09-06 MED ORDER — IOPAMIDOL (ISOVUE-300) INJECTION 61%
100.0000 mL | Freq: Once | INTRAVENOUS | Status: AC | PRN
  Administered 2015-09-06: 100 mL via INTRAVENOUS

## 2015-09-12 DIAGNOSIS — Z0120 Encounter for dental examination and cleaning without abnormal findings: Secondary | ICD-10-CM | POA: Diagnosis not present

## 2015-09-15 ENCOUNTER — Encounter: Payer: Self-pay | Admitting: *Deleted

## 2015-09-22 DIAGNOSIS — N739 Female pelvic inflammatory disease, unspecified: Secondary | ICD-10-CM | POA: Diagnosis not present

## 2015-10-18 ENCOUNTER — Telehealth: Payer: Self-pay

## 2015-10-18 NOTE — Telephone Encounter (Signed)
Patient is on my [employer] list for 2017. Patient has an appt scheduled for 02/2016 with PCP

## 2015-10-25 DIAGNOSIS — Z0120 Encounter for dental examination and cleaning without abnormal findings: Secondary | ICD-10-CM | POA: Diagnosis not present

## 2015-11-01 DIAGNOSIS — Z0120 Encounter for dental examination and cleaning without abnormal findings: Secondary | ICD-10-CM | POA: Diagnosis not present

## 2015-11-08 DIAGNOSIS — H2513 Age-related nuclear cataract, bilateral: Secondary | ICD-10-CM | POA: Diagnosis not present

## 2015-12-07 ENCOUNTER — Other Ambulatory Visit: Payer: Self-pay | Admitting: Internal Medicine

## 2015-12-07 DIAGNOSIS — Z1231 Encounter for screening mammogram for malignant neoplasm of breast: Secondary | ICD-10-CM

## 2015-12-21 ENCOUNTER — Other Ambulatory Visit: Payer: Self-pay | Admitting: Internal Medicine

## 2015-12-21 ENCOUNTER — Ambulatory Visit
Admission: RE | Admit: 2015-12-21 | Discharge: 2015-12-21 | Disposition: A | Discharge status: Home / Self Care | Payer: Medicare HMO | Source: Ambulatory Visit | Attending: Internal Medicine | Admitting: Internal Medicine

## 2015-12-21 DIAGNOSIS — Z1231 Encounter for screening mammogram for malignant neoplasm of breast: Secondary | ICD-10-CM | POA: Diagnosis not present

## 2015-12-26 ENCOUNTER — Other Ambulatory Visit: Payer: Self-pay | Admitting: Internal Medicine

## 2015-12-26 DIAGNOSIS — E7800 Pure hypercholesterolemia, unspecified: Secondary | ICD-10-CM

## 2015-12-26 DIAGNOSIS — I10 Essential (primary) hypertension: Secondary | ICD-10-CM

## 2015-12-26 NOTE — Progress Notes (Signed)
Orders placed for f/u labs.  

## 2015-12-29 NOTE — Telephone Encounter (Signed)
Left a message for patient to return my call regarding scheduling for AWV and labs 2 weeks prior to upcoming physical.  PCP has placed orders.

## 2016-01-19 DIAGNOSIS — K5732 Diverticulitis of large intestine without perforation or abscess without bleeding: Secondary | ICD-10-CM | POA: Diagnosis not present

## 2016-02-08 ENCOUNTER — Other Ambulatory Visit (INDEPENDENT_AMBULATORY_CARE_PROVIDER_SITE_OTHER): Payer: Medicare HMO

## 2016-02-08 ENCOUNTER — Ambulatory Visit (INDEPENDENT_AMBULATORY_CARE_PROVIDER_SITE_OTHER): Payer: Medicare HMO

## 2016-02-08 VITALS — BP 128/70 | HR 62 | Temp 97.9°F | Resp 14 | Ht 63.0 in | Wt 173.4 lb

## 2016-02-08 DIAGNOSIS — E7800 Pure hypercholesterolemia, unspecified: Secondary | ICD-10-CM

## 2016-02-08 DIAGNOSIS — Z0000 Encounter for general adult medical examination without abnormal findings: Secondary | ICD-10-CM

## 2016-02-08 DIAGNOSIS — I10 Essential (primary) hypertension: Secondary | ICD-10-CM | POA: Diagnosis not present

## 2016-02-08 LAB — CBC WITH DIFFERENTIAL/PLATELET
Basophils Absolute: 0.1 10*3/uL (ref 0.0–0.1)
Basophils Relative: 0.7 % (ref 0.0–3.0)
Eosinophils Absolute: 0 10*3/uL (ref 0.0–0.7)
Eosinophils Relative: 0.5 % (ref 0.0–5.0)
HEMATOCRIT: 40.8 % (ref 36.0–46.0)
HEMOGLOBIN: 13.8 g/dL (ref 12.0–15.0)
LYMPHS ABS: 2.3 10*3/uL (ref 0.7–4.0)
Lymphocytes Relative: 32.4 % (ref 12.0–46.0)
MCHC: 33.8 g/dL (ref 30.0–36.0)
MCV: 91 fl (ref 78.0–100.0)
MONO ABS: 0.6 10*3/uL (ref 0.1–1.0)
MONOS PCT: 8.8 % (ref 3.0–12.0)
NEUTROS ABS: 4.1 10*3/uL (ref 1.4–7.7)
Neutrophils Relative %: 57.6 % (ref 43.0–77.0)
Platelets: 247 10*3/uL (ref 150.0–400.0)
RBC: 4.48 Mil/uL (ref 3.87–5.11)
RDW: 13.8 % (ref 11.5–15.5)
WBC: 7.2 10*3/uL (ref 4.0–10.5)

## 2016-02-08 LAB — LIPID PANEL
CHOLESTEROL: 161 mg/dL (ref 0–200)
HDL: 53.5 mg/dL (ref >39.00)
LDL Cholesterol: 79 mg/dL (ref 0–99)
NonHDL: 107.16
TRIGLYCERIDES: 142 mg/dL (ref 0.0–149.0)
Total CHOL/HDL Ratio: 3
VLDL: 28.4 mg/dL (ref 0.0–40.0)

## 2016-02-08 LAB — COMPREHENSIVE METABOLIC PANEL
ALBUMIN: 3.6 g/dL (ref 3.5–5.2)
ALK PHOS: 86 U/L (ref 39–117)
ALT: 19 U/L (ref 0–35)
AST: 25 U/L (ref 0–37)
BUN: 11 mg/dL (ref 6–23)
CALCIUM: 8.9 mg/dL (ref 8.4–10.5)
CO2: 31 mEq/L (ref 19–32)
Chloride: 106 mEq/L (ref 96–112)
Creatinine, Ser: 0.74 mg/dL (ref 0.40–1.20)
GFR: 80.28 mL/min (ref >60.00)
Glucose, Bld: 96 mg/dL (ref 70–99)
POTASSIUM: 4.5 meq/L (ref 3.5–5.1)
Sodium: 141 mEq/L (ref 135–145)
TOTAL PROTEIN: 6.4 g/dL (ref 6.0–8.3)
Total Bilirubin: 0.6 mg/dL (ref 0.2–1.2)

## 2016-02-08 LAB — TSH: TSH: 1.33 u[IU]/mL (ref 0.35–4.50)

## 2016-02-08 NOTE — Progress Notes (Signed)
Subjective:   [name] [name] is a 82 y.o. female who presents for an Initial Medicare Annual Wellness Visit.  Review of Systems    No ROS.  Medicare Wellness Visit.  Cardiac Risk Factors include: advanced age (>82men, >65 women);hypertension     Objective:    Today's Vitals   02/08/16 0926  BP: 128/70  Pulse: 62  Resp: 14  Temp: 97.9 F (36.6 C)  TempSrc: Oral  SpO2: 97%  Weight: 173 lb 6.4 oz (78.7 kg)  Height: 5\' 3"  (1.6 m)   Body mass index is 30.72 kg/m.   Current Medications (verified) Outpatient Encounter Prescriptions as of 02/08/2016  Medication Sig  . Calcium Carbonate-Vit D-Min (CALCIUM 600 + MINERALS) 600-200 MG-UNIT TABS Take by mouth once. Calcium 600 + Minerals (calcium carbonate-vit d3-min) 600 mg calcium- 200 unit   No facility-administered encounter medications on file as of 02/08/2016.     Allergies (verified) Review of patient's allergies indicates no known allergies.   History: Past Medical History:  Diagnosis Date  . Diverticulitis of sigmoid colon   . Endometriosis   . Fibrocystic breast disease   . Glaucoma   . H/O urinary incontinence   . History of chicken pox   . History of colon polyps   . Hyperlipidemia   . Hypertension   . Ovarian cancer ([place]) 2002   Chemo tx's @ [facility] with Total Hysterectomy.  . Sinusitis   . Tuberculosis    positve skin test   Past Surgical History:  Procedure Laterality Date  . ABDOMINAL HYSTERECTOMY  2003   complete  . APPENDECTOMY  2003  . BREAST BIOPSY Left 1993   excisional bx. negative results  . BREAST SURGERY Left 1982   papilloma removal  . NASAL SINUS SURGERY     For fungal infection  . NASAL SINUS SURGERY  8/05 8/06  . OSTOMY  2012   diverticulitis with abcess  . ovarian cancer     Exploratory lap  . TONSILLECTOMY AND ADENOIDECTOMY  1947   Family History  Problem Relation Age of Onset  . Hypertension Mother   . Coronary artery disease Mother   . Heart disease Mother   . Kidney  disease Mother   . Diabetes Mother   . Cancer Mother     uterine cancer  . Diabetes Father   . Kidney failure Father   . Diabetes Brother   . Coronary artery disease Brother   . Cancer Brother     bladder cancer  . Stroke Sister   . Heart disease Sister     [facility]  . Cancer Maternal Aunt     ovarian cancer  . Breast cancer Cousin   . Cancer Brother     Prostate   Social History   Occupational History  . Not on file.   Social History Main Topics  . Smoking status: Never Smoker  . Smokeless tobacco: Never Used  . Alcohol use No  . Drug use: No  . Sexual activity: Not Currently    Tobacco Counseling Counseling given: Not Answered   Activities of Daily Living In your present state of health, do you have any difficulty performing the following activities: 02/08/2016  Hearing? N  Vision? N  Difficulty concentrating or making decisions? N  Walking or climbing stairs? N  Dressing or bathing? N  Doing errands, shopping? N  Preparing Food and eating ? N  Using the Toilet? N  In the past six months, have you accidently leaked  urine? Y  Do you have problems with loss of bowel control? N  Managing your Medications? N  Managing your Finances? N  Housekeeping or managing your Housekeeping? N  Some recent data might be hidden    Immunizations and Health Maintenance  There is no immunization history on file for this patient. There are no preventive care reminders to display for this patient.  Patient Care Team: Einar Pheasant, MD as PCP - General (Internal Medicine) Leone Haven, MD as Consulting Physician (Family Medicine) Seeplaputhur Robinette Haines, MD (General Surgery)  Indicate any recent Medical Services you may have received from other than [facility] providers in the past year (date may be approximate).     Assessment:   This is a routine wellness examination for Jose. The goal of the wellness visit is to assist the patient how to close the gaps in care and  create a preventative care plan for the patient.   Taking calcium VIT D as appropriate/Osteoporosis risk reviewed.  Medications reviewed; taking without issues or barriers.  Safety issues reviewed; smoke detectors in the home. Lives alone.  No firearms in the home. Wears seatbelts when driving or riding with others. No violence in the home.  No identified risk were noted; The patient was oriented x 3; appropriate in dress and manner and no objective failures at ADL's or IADL's.   Body mass index; discussed the importance of a healthy diet, water intake and exercise. Educational material provided.  She has seen her dentist (Dr. Jacalyn Lefevre) within the last 6 months.  Health maintenance gaps; closed.  Patient Concerns: None at this time. Follow up with PCP as needed.  Hearing/Vision screen Hearing Screening Comments: Passed the whisper test Vision Screening Comments: Followed by [facility] Wears glasses Last OV 09/2015  Dietary issues and exercise activities discussed: Current Exercise Habits: Home exercise routine, Type of exercise: walking, Time (Minutes): 60, Frequency (Times/Week): 7, Weekly Exercise (Minutes/Week): 420, Intensity: Mild  Goals    . Increase physical activity          BACK OF THE ARM EXERCISES.  SWITCH HAND PLACEMENT BETWEEN SETS.  REPEAT AS TOLERATED.  EDUCATION PROVIDED.      Depression Screen PHQ 2/9 Scores 02/08/2016 02/17/2015 03/26/2014 11/03/2012  PHQ - 2 Score 0 0 0 0    Fall Risk Fall Risk  02/08/2016 02/17/2015 03/26/2014 11/03/2012  Falls in the past year? No No No No    Cognitive Function: MMSE - Mini Mental State Exam 02/08/2016  Orientation to time 5  Orientation to Place 5  Registration 3  Attention/ Calculation 5  Recall 3  Language- name 2 objects 2  Language- repeat 1  Language- follow 3 step command 3  Language- read & follow direction 1  Write a sentence 1  Copy design 1  Total score 30    Screening Tests Health  Maintenance  Topic Date Due  . INFLUENZA VACCINE  02/17/2016 (Originally 01/10/2016)  . DEXA SCAN  02/17/2016 (Originally 03/17/2001)  . ZOSTAVAX  02/17/2016 (Originally 03/17/1996)  . TETANUS/TDAP  02/17/2016 (Originally 03/18/1955)  . PNA vac Low Risk Adult (1 of 2 - PCV13) 02/17/2016 (Originally 03/17/2001)  . MAMMOGRAM  12/20/2016      Plan:    End of life planning; Advance aging; Advanced directives discussed. No HCPOA/Living Will.  Additional information declined at this time.  She will follow up with PCP.     During the course of the visit, Shaunell was educated and counseled about the following  appropriate screening and preventive services:   Vaccines to include Pneumoccal, Influenza, Hepatitis B, Td, Zostavax, HCV  Electrocardiogram  Cardiovascular disease screening  Colorectal cancer screening  Bone density screening  Diabetes screening  Glaucoma screening  Mammography/PAP  Nutrition counseling  Smoking cessation counseling  Patient Instructions (the written plan) were given to the patient.    OBrien-Blaney, Evoleht Hovatter L, LPN   [phone]    Reviewed above information.  Agree with assessment and plan.   Dr Nicki Reaper

## 2016-02-08 NOTE — Patient Instructions (Addendum)
  [name] [name] , Thank you for taking time to come for your Medicare Wellness Visit. I appreciate your ongoing commitment to your health goals. Please review the following plan we discussed and let me know if I can assist you in the future.   OVER THE COUNTER NIVEA BRAND-COENZYME Q10 LOTION to help with SKIN TONING  FOLLOW UP WITH DR. Nicki Reaper AS NEEDED  These are the goals we discussed: Goals    . Increase physical activity          BACK OF THE ARM EXERCISES.  SWITCH HAND PLACEMENT BETWEEN SETS.  REPEAT AS TOLERATED.  EDUCATION PROVIDED.       This is a list of the screening recommended for you and due dates:  Health Maintenance  Topic Date Due  . Flu Shot  02/17/2016*  . DEXA scan (bone density measurement)  02/17/2016*  . Shingles Vaccine  02/17/2016*  . Tetanus Vaccine  02/17/2016*  . Pneumonia vaccines (1 of 2 - PCV13) 02/17/2016*  . Mammogram  12/20/2016  *Topic was postponed. The date shown is not the original due date.

## 2016-02-09 ENCOUNTER — Encounter: Payer: Self-pay | Admitting: Internal Medicine

## 2016-02-15 DIAGNOSIS — Z0120 Encounter for dental examination and cleaning without abnormal findings: Secondary | ICD-10-CM | POA: Diagnosis not present

## 2016-02-22 ENCOUNTER — Encounter: Payer: Self-pay | Admitting: General Surgery

## 2016-02-22 ENCOUNTER — Ambulatory Visit (INDEPENDENT_AMBULATORY_CARE_PROVIDER_SITE_OTHER): Payer: Medicare HMO | Admitting: Internal Medicine

## 2016-02-22 ENCOUNTER — Encounter: Payer: Self-pay | Admitting: Internal Medicine

## 2016-02-22 VITALS — BP 138/80 | HR 59 | Temp 98.1°F | Ht 63.0 in | Wt 174.6 lb

## 2016-02-22 DIAGNOSIS — R1031 Right lower quadrant pain: Secondary | ICD-10-CM

## 2016-02-22 DIAGNOSIS — Z8543 Personal history of malignant neoplasm of ovary: Secondary | ICD-10-CM

## 2016-02-22 DIAGNOSIS — I10 Essential (primary) hypertension: Secondary | ICD-10-CM

## 2016-02-22 DIAGNOSIS — Z0000 Encounter for general adult medical examination without abnormal findings: Secondary | ICD-10-CM

## 2016-02-22 DIAGNOSIS — Z86010 Personal history of colonic polyps: Secondary | ICD-10-CM

## 2016-02-22 DIAGNOSIS — E7800 Pure hypercholesterolemia, unspecified: Secondary | ICD-10-CM

## 2016-02-22 DIAGNOSIS — L0291 Cutaneous abscess, unspecified: Secondary | ICD-10-CM

## 2016-02-22 DIAGNOSIS — Z1211 Encounter for screening for malignant neoplasm of colon: Secondary | ICD-10-CM

## 2016-02-22 NOTE — Progress Notes (Signed)
Pre visit review using our clinic review tool, if applicable. No additional management support is needed unless otherwise documented below in the visit note. 

## 2016-02-22 NOTE — Progress Notes (Signed)
Patient ID: SUMIRE WECHSLER, female   DOB: 1933/08/29, 82 y.o.   MRN: [id number]   Subjective:    Patient ID: [name], female    DOB: [birth year], 82 y.o.   MRN: [id number]  HPI  Patient here for her physical exam.  Has been followed by oncology at [facility].  History of presacral abscess 06/2015.   Has a history of ovarian cancer.  Recently evaluated 01/2016.  Referred for colonoscopy.  She wants to have her colonoscopy here in town.  Wants to see Dr Bary Castilla.  States has occasional irritation RLQ.  No significant pain.  Eating and drinking well.  No headache.  No dizziness.  No chest pain.  No sob.  No acid reflux.  Bowels stable.     Past Medical History:  Diagnosis Date  . Diverticulitis of sigmoid colon   . Endometriosis   . Fibrocystic breast disease   . Glaucoma   . H/O urinary incontinence   . History of chicken pox   . History of colon polyps   . Hyperlipidemia   . Hypertension   . Ovarian cancer ([place]) 2002   Chemo tx's @ [facility] with Total Hysterectomy.  . Sinusitis   . Tuberculosis    positve skin test   Past Surgical History:  Procedure Laterality Date  . ABDOMINAL HYSTERECTOMY  2003   complete  . APPENDECTOMY  2003  . BREAST BIOPSY Left 1993   excisional bx. negative results  . BREAST SURGERY Left 1982   papilloma removal  . NASAL SINUS SURGERY     For fungal infection  . NASAL SINUS SURGERY  8/05 8/06  . OSTOMY  2012   diverticulitis with abcess  . ovarian cancer     Exploratory lap  . TONSILLECTOMY AND ADENOIDECTOMY  1947   Family History  Problem Relation Age of Onset  . Hypertension Mother   . Coronary artery disease Mother   . Heart disease Mother   . Kidney disease Mother   . Diabetes Mother   . Cancer Mother     uterine cancer  . Diabetes Father   . Kidney failure Father   . Diabetes Brother   . Coronary artery disease Brother   . Cancer Brother     bladder cancer  . Stroke Sister   . Heart disease Sister     [facility]  . Cancer Maternal  Aunt     ovarian cancer  . Breast cancer Cousin   . Cancer Brother     Prostate   Social History   Social History  . Marital status: Married    Spouse name: N/A  . Number of children: 0  . Years of education: N/A   Social History Main Topics  . Smoking status: Never Smoker  . Smokeless tobacco: Never Used  . Alcohol use No  . Drug use: No  . Sexual activity: Not Currently   Other Topics Concern  . None   Social History Narrative  . None    Outpatient Encounter Prescriptions as of 02/22/2016  Medication Sig  . Calcium Carbonate-Vit D-Min (CALCIUM 600 + MINERALS) 600-200 MG-UNIT TABS Take by mouth once. Calcium 600 + Minerals (calcium carbonate-vit d3-min) 600 mg calcium- 200 unit   No facility-administered encounter medications on file as of 02/22/2016.     Review of Systems  Constitutional: Negative for appetite change and unexpected weight change.  HENT: Negative for congestion and sinus pressure.   Eyes: Negative for pain and visual disturbance.  Respiratory: Negative for cough, chest tightness and shortness of breath.   Cardiovascular: Negative for chest pain, palpitations and leg swelling.  Gastrointestinal: Negative for diarrhea, nausea and vomiting.       Minimal discomfort RLQ.    Genitourinary: Negative for difficulty urinating and dysuria.  Musculoskeletal: Negative for back pain and joint swelling.  Skin: Negative for color change and rash.  Neurological: Negative for dizziness, light-headedness and headaches.  Hematological: Negative for adenopathy. Does not bruise/bleed easily.  Psychiatric/Behavioral: Negative for agitation and dysphoric mood.       Objective:    Physical Exam  Constitutional: She is oriented to person, place, and time. She appears well-developed and well-nourished. No distress.  HENT:  Nose: Nose normal.  Mouth/Throat: Oropharynx is clear and moist.  Eyes: Right eye exhibits no discharge. Left eye exhibits no discharge. No  scleral icterus.  Neck: Neck supple. No thyromegaly present.  Cardiovascular: Normal rate and regular rhythm.   Pulmonary/Chest: Breath sounds normal. No accessory muscle usage. No tachypnea. No respiratory distress. She has no decreased breath sounds. She has no wheezes. She has no rhonchi. Right breast exhibits no inverted nipple, no mass, no nipple discharge and no tenderness (no axillary adenopathy). Left breast exhibits no inverted nipple, no mass, no nipple discharge and no tenderness (no axilarry adenopathy).  Abdominal: Soft. Bowel sounds are normal. There is no tenderness.  Musculoskeletal: She exhibits no edema or tenderness.  Lymphadenopathy:    She has no cervical adenopathy.  Neurological: She is alert and oriented to person, place, and time.  Skin: Skin is warm. No rash noted. No erythema.  Psychiatric: She has a normal mood and affect. Her behavior is normal.    BP 138/80   Pulse (!) 59   Temp 98.1 F (36.7 C) (Oral)   Ht 5\' 3"  (1.6 m)   Wt 174 lb 9.6 oz (79.2 kg)   SpO2 98%   BMI 30.93 kg/m  Wt Readings from Last 3 Encounters:  02/22/16 174 lb 9.6 oz (79.2 kg)  02/08/16 173 lb 6.4 oz (78.7 kg)  08/17/15 168 lb 8 oz (76.4 kg)     Lab Results  Component Value Date   WBC 7.2 02/08/2016   HGB 13.8 02/08/2016   HCT 40.8 02/08/2016   PLT 247.0 02/08/2016   GLUCOSE 96 02/08/2016   CHOL 161 02/08/2016   TRIG 142.0 02/08/2016   HDL 53.50 02/08/2016   LDLCALC 79 02/08/2016   ALT 19 02/08/2016   AST 25 02/08/2016   NA 141 02/08/2016   K 4.5 02/08/2016   CL 106 02/08/2016   CREATININE 0.74 02/08/2016   BUN 11 02/08/2016   CO2 31 02/08/2016   TSH 1.33 02/08/2016   INR 1.0 05/21/2012    Mm Screening Breast Tomo Bilateral  Result Date: 12/21/2015 CLINICAL DATA:  Screening. EXAM: 2D DIGITAL SCREENING BILATERAL MAMMOGRAM WITH CAD AND ADJUNCT TOMO COMPARISON:  Previous exam(s). ACR Breast Density Category d: The breast tissue is extremely dense, which lowers the  sensitivity of mammography. FINDINGS: There are no findings suspicious for malignancy. Images were processed with CAD. IMPRESSION: No mammographic evidence of malignancy. A result letter of this screening mammogram will be mailed directly to the patient. RECOMMENDATION: Screening mammogram in one year. (Code:SM-B-01Y) BI-RADS CATEGORY  1: Negative. Electronically Signed   By: Pamelia Hoit M.D.   On: 12/21/2015 15:18       Assessment & Plan:   Problem List Items Addressed This Visit    Abdominal pain  Minimal discomfort RLQ.  W/up as outlined.  Bowels doing well.        Abscess    Found to have presacral abscess on CT.  Followed at [facility].  Referred for colonoscopy.  Wants done locally.  Refer to Dr Bary Castilla.        Essential hypertension, benign    Blood pressure under good control.  Continue same medication regimen.  Follow pressures.  Follow metabolic panel.        Health care maintenance    Physical today 02/22/16.  Mammogram 12/21/15 - birads I.  Referred for colonoscopy.        History of colonic polyps    Colonoscopy 10/16/12.  Oncology wanted f/u colonoscopy.  Schedule with Dr Bary Castilla.        History of ovarian cancer    Followed by oncology at [facility].  Referred for colonoscopy.  Wants to have here in town.  Wants to see Dr Bary Castilla.  Follow.        Hypercholesterolemia    Low cholesterol diet and exercise.  Follow lipid panel.         Other Visit Diagnoses    Routine general medical examination at a health care facility    -  Primary   Colon cancer screening       Relevant Orders   Ambulatory referral to General Surgery       Einar Pheasant, MD

## 2016-02-26 ENCOUNTER — Encounter: Payer: Self-pay | Admitting: Internal Medicine

## 2016-02-26 NOTE — Assessment & Plan Note (Signed)
Followed by oncology at [facility].  Referred for colonoscopy.  Wants to have here in town.  Wants to see Dr Bary Castilla.  Follow.

## 2016-02-26 NOTE — Assessment & Plan Note (Signed)
Found to have presacral abscess on CT.  Followed at [facility].  Referred for colonoscopy.  Wants done locally.  Refer to Dr Bary Castilla.

## 2016-02-26 NOTE — Assessment & Plan Note (Signed)
Blood pressure under good control.  Continue same medication regimen.  Follow pressures.  Follow metabolic panel.   

## 2016-02-26 NOTE — Assessment & Plan Note (Signed)
Physical today 02/22/16.  Mammogram 12/21/15 - birads I.  Referred for colonoscopy.

## 2016-02-26 NOTE — Assessment & Plan Note (Signed)
Colonoscopy 10/16/12.  Oncology wanted f/u colonoscopy.  Schedule with Dr Bary Castilla.

## 2016-02-26 NOTE — Assessment & Plan Note (Signed)
Low cholesterol diet and exercise.  Follow lipid panel.   

## 2016-02-26 NOTE — Assessment & Plan Note (Signed)
Minimal discomfort RLQ.  W/up as outlined.  Bowels doing well.

## 2016-03-08 ENCOUNTER — Encounter: Payer: Self-pay | Admitting: General Surgery

## 2016-03-08 ENCOUNTER — Ambulatory Visit (INDEPENDENT_AMBULATORY_CARE_PROVIDER_SITE_OTHER): Payer: Medicare HMO | Admitting: General Surgery

## 2016-03-08 VITALS — BP 166/82 | HR 62 | Resp 12 | Ht 63.0 in | Wt 174.0 lb

## 2016-03-08 DIAGNOSIS — N739 Female pelvic inflammatory disease, unspecified: Secondary | ICD-10-CM | POA: Insufficient documentation

## 2016-03-08 DIAGNOSIS — K5793 Diverticulitis of intestine, part unspecified, without perforation or abscess with bleeding: Secondary | ICD-10-CM

## 2016-03-08 DIAGNOSIS — N823 Fistula of vagina to large intestine: Secondary | ICD-10-CM

## 2016-03-08 DIAGNOSIS — L988 Other specified disorders of the skin and subcutaneous tissue: Secondary | ICD-10-CM

## 2016-03-08 DIAGNOSIS — N732 Unspecified parametritis and pelvic cellulitis: Secondary | ICD-10-CM | POA: Diagnosis not present

## 2016-03-08 LAB — POC HEMOCCULT BLD/STL (OFFICE/1-CARD/DIAGNOSTIC): FECAL OCCULT BLD: NEGATIVE

## 2016-03-08 NOTE — Progress Notes (Signed)
Patient ID: [name] [name], female   DOB: September 30, 1933, 82 y.o.   MRN: [id number]  Chief Complaint  Patient presents with  . Colonoscopy    HPI [name] [name] is a 82 y.o. female Who presents for a colonoscopy discussion. The last colonoscopy was completed in 2014 . Denies any gastrointestinal issues. Bowels move regular and no bleeding noted.  Most recent buttock abscess was drained at [facility] this year. She had a change in bowel shape and pain prior to the abscess. Followed by Dr Marcelino Scot at [facility].  HPI  Past Medical History:  Diagnosis Date  . Diverticulitis of sigmoid colon   . Endometriosis   . Fibrocystic breast disease   . Glaucoma   . H/O urinary incontinence   . History of chicken pox   . History of colon polyps 2014  . Hyperlipidemia   . Hypertension   . Ovarian cancer ([place]) 2002   Chemo tx's @ [facility] with Total Hysterectomy. Dr Amalia Hailey  . Sinusitis   . Tuberculosis    positve skin test    Past Surgical History:  Procedure Laterality Date  . ABDOMINAL HYSTERECTOMY  2003   complete Dr Amalia Hailey  . ABSCESS DRAINAGE  2017   right buttock at [facility]  . APPENDECTOMY  2003  . BREAST BIOPSY Left 1993   excisional bx. negative results  . BREAST SURGERY Left 1982   papilloma removal  . COLONOSCOPY  2006, 2014   Dr Vira Agar   . COLOSTOMY REVERSAL  2014   at [facility] Dr Raynaldo Opitz  . NASAL SINUS SURGERY     For fungal infection  . NASAL SINUS SURGERY  8/05 8/06  . OSTOMY  05/22/2012   diverticulitis with abcess/ Dr Tamala Julian  . ovarian cancer     Exploratory lap  . TONSILLECTOMY AND ADENOIDECTOMY  1947    Family History  Problem Relation Age of Onset  . Hypertension Mother   . Coronary artery disease Mother   . Heart disease Mother   . Kidney disease Mother   . Diabetes Mother   . Cancer Mother     uterine cancer  . Diabetes Father   . Kidney failure Father   . Diabetes Brother   . Coronary artery disease Brother   . Cancer Brother     bladder cancer  . Stroke  Sister   . Heart disease Sister     [facility]  . Cancer Maternal Aunt     ovarian cancer  . Breast cancer Cousin   . Cancer Brother     Prostate    Social History Social History  Substance Use Topics  . Smoking status: Never Smoker  . Smokeless tobacco: Never Used  . Alcohol use No    No Known Allergies  Current Outpatient Prescriptions  Medication Sig Dispense Refill  . Calcium Carbonate-Vit D-Min (CALCIUM 600 + MINERALS) 600-200 MG-UNIT TABS Take by mouth once. Calcium 600 + Minerals (calcium carbonate-vit d3-min) 600 mg calcium- 200 unit     No current facility-administered medications for this visit.     Review of Systems Review of Systems  Constitutional: Negative.   Respiratory: Negative.   Cardiovascular: Negative.   Gastrointestinal: Negative for abdominal pain, constipation, diarrhea, nausea and vomiting.    Blood pressure (!) 166/82, pulse 62, resp. rate 12, height 5\' 3"  (1.6 m), weight 174 lb (78.9 kg).  Physical Exam Physical Exam  Constitutional: She is oriented to person, place, and time. She appears well-developed and well-nourished.  HENT:  Mouth/Throat: Oropharynx is clear and moist.  Eyes: Conjunctivae are normal. No scleral icterus.  Neck: Neck supple.  Cardiovascular: Normal rate, regular rhythm and normal heart sounds.   Pulmonary/Chest: Effort normal and breath sounds normal.  Abdominal: Soft. Bowel sounds are normal. There is no tenderness.  Genitourinary: Rectal exam shows guaiac negative stool.  Lymphadenopathy:    She has no cervical adenopathy.  Neurological: She is oriented to person, place, and time.  Skin: Skin is warm and dry.  Psychiatric: Her behavior is normal.    Data Reviewed 01/19/2016 office visit with Redgie Grayer, nurse practitioner for Dr. Sheryn Bison reviewed. Summary of spring 2017 care where she had percutaneous drainage of a pelvic Says followed by contrast study of both the rectum and the drainage catheter which failed  to delineate a clear anastomotic leak.  Culture results from 08/09/2015 showed gram-positive cocci in change, white gram-positive rods. Mixed aerobic and anaerobic flora.  CT scan of 07/11/2015 which identified the pelvic abscess was reviewed. This showed a 5 cm collection above the leave a level extending to the staple line. Incidentally noted is a greater than 4 cm gallstone.  Follow-up scan dated 09/06/2015 showed no residual fluid collection although there was still some soft tissue thickening. No free fluid or free air. (Sees the patient reports that the drain fell out spontaneously).  Colonoscopy report from 07/18/2004 was notable for diverticulosis with a single polyp in the sigmoid colon.  Colonoscopy completed 10/16/2012 from both the stoma and the rectal segment showed diverticula in the sigmoid and descending colon. A few small tics noted in the proximal rectum/lower sigmoid.  After report from her 05/23/2012 procedure for perforated diverticulitis and management of a colovesical fistula was reviewed. Pathology showed ruptured diverticulum with abscess and a fistula tract.  Operative report from the laparoscopy followed by open take down of the colostomy completed by Randa Spike, M.D. at [facility] dated 03/19/2013 was reviewed. A long temperamental laparoscopic dissection was completed by open repair.  Pathology of the tissue removed at the time of the colostomy takedown showed a small bowel segment without malignancy. Rectosigmoid section without malignancy. Colon segment show diverticular disease with focal submucosal hemorrhage and tubular adenoma. No dysplasia or carcinoma.  Assessment    Previous pelvic abscess with report of recurrent intermittent drainage, negative radiologic study for anastomotic leak.  Benign perineal exam, negative digital rectal exam.    Plan    The opportunity to evaluate the anastomotic area with direct visualization by colonoscopy was  reviewed. In part this can be justified by the finding of a tubular adenoma at the time of her colostomy takedown 3 years ago. She is minimally symptomatic at present, and simple observation could certainly be considered unless she develops a recurrent abscess.  She reports occasional spots of drainage from the old percutaneous drainage site, but there is no evidence of inflammation on today's exam.  The patient questioned whether I was competent enough to complete the study. Considering her previous surgical procedures in review of her operative reports, I did feel competent, but I offered to have her referred back to Dr. Vira Agar who had previously done her exams or to [facility], which ever was her preference.    Colonoscopy with possible biopsy/polypectomy prn: Information regarding the procedure, including its potential risks and complications (including but not limited to perforation of the bowel, which may require emergency surgery to repair, and bleeding) was verbally given to the patient. Educational information regarding lower intestinal endoscopy was given  to the patient. Written instructions for how to complete the bowel prep using Miralax were provided. The importance of drinking ample fluids to avoid dehydration as a result of the prep emphasized.  The patient will notify the office if we can be of assistance.   This information has been scribed by Karie Fetch RN, BSN,BC.  Robert Bellow 03/08/2016, 1:25 PM

## 2016-03-08 NOTE — Patient Instructions (Signed)
Colonoscopy A colonoscopy is an exam to look at the entire large intestine (colon). This exam can help find problems such as tumors, polyps, inflammation, and areas of bleeding. The exam takes about 1 hour.  LET YOUR HEALTH CARE PROVIDER KNOW ABOUT:   Any allergies you have.  All medicines you are taking, including vitamins, herbs, eye drops, creams, and over-the-counter medicines.  Previous problems you or members of your family have had with the use of anesthetics.  Any blood disorders you have.  Previous surgeries you have had.  Medical conditions you have. RISKS AND COMPLICATIONS  Generally, this is a safe procedure. However, as with any procedure, complications can occur. Possible complications include:  Bleeding.  Tearing or rupture of the colon wall.  Reaction to medicines given during the exam.  Infection (rare). BEFORE THE PROCEDURE   Ask your health care provider about changing or stopping your regular medicines.  You may be prescribed an oral bowel prep. This involves drinking a large amount of medicated liquid, starting the day before your procedure. The liquid will cause you to have multiple loose stools until your stool is almost clear or light green. This cleans out your colon in preparation for the procedure.  Do not eat or drink anything else once you have started the bowel prep, unless your health care provider tells you it is safe to do so.  Arrange for someone to drive you home after the procedure. PROCEDURE   You will be given medicine to help you relax (sedative).  You will lie on your side with your knees bent.  A long, flexible tube with a light and camera on the end (colonoscope) will be inserted through the rectum and into the colon. The camera sends video back to a computer screen as it moves through the colon. The colonoscope also releases carbon dioxide gas to inflate the colon. This helps your health care provider see the area better.  During  the exam, your health care provider may take a small tissue sample (biopsy) to be examined under a microscope if any abnormalities are found.  The exam is finished when the entire colon has been viewed. AFTER THE PROCEDURE   Do not drive for 24 hours after the exam.  You may have a small amount of blood in your stool.  You may pass moderate amounts of gas and have mild abdominal cramping or bloating. This is caused by the gas used to inflate your colon during the exam.  Ask when your test results will be ready and how you will get your results. Make sure you get your test results.   This information is not intended to replace advice given to you by your health care provider. Make sure you discuss any questions you have with your health care provider.   Document Released: 05/25/2000 Document Revised: 03/18/2013 Document Reviewed: 02/02/2013 Elsevier Interactive Patient Education 2016 Elsevier Inc.  

## 2016-03-13 ENCOUNTER — Other Ambulatory Visit: Payer: Self-pay | Admitting: General Surgery

## 2016-03-13 ENCOUNTER — Telehealth: Payer: Self-pay

## 2016-03-13 DIAGNOSIS — Z1211 Encounter for screening for malignant neoplasm of colon: Secondary | ICD-10-CM

## 2016-03-13 MED ORDER — POLYETHYLENE GLYCOL 3350 17 GM/SCOOP PO POWD: 1.0000 | Freq: Once | ORAL | 0 refills | Status: AC

## 2016-03-13 NOTE — Telephone Encounter (Signed)
Patient called to set up her Colonoscopy with Dr Bary Castilla. The patient is scheduled for a Colonoscopy at [facility] on 03/27/16. They are aware to call the day before to get their arrival time. Miralax prescription has been sent into the patient's pharmacy. The patient is aware of date and instructions.

## 2016-03-27 ENCOUNTER — Encounter: Payer: Self-pay | Admitting: Anesthesiology

## 2016-03-27 ENCOUNTER — Encounter
Admission: RE | Disposition: A | Discharge status: Home / Self Care | Payer: Self-pay | Source: Ambulatory Visit | Attending: General Surgery

## 2016-03-27 ENCOUNTER — Ambulatory Visit: Payer: Medicare HMO | Admitting: Anesthesiology

## 2016-03-27 ENCOUNTER — Ambulatory Visit
Admission: RE | Admit: 2016-03-27 | Discharge: 2016-03-27 | Disposition: A | Discharge status: Home / Self Care | Payer: Medicare HMO | Source: Ambulatory Visit | Attending: General Surgery | Admitting: General Surgery

## 2016-03-27 DIAGNOSIS — Z8543 Personal history of malignant neoplasm of ovary: Secondary | ICD-10-CM | POA: Insufficient documentation

## 2016-03-27 DIAGNOSIS — I10 Essential (primary) hypertension: Secondary | ICD-10-CM | POA: Diagnosis not present

## 2016-03-27 DIAGNOSIS — Z86010 Personal history of colonic polyps: Secondary | ICD-10-CM | POA: Insufficient documentation

## 2016-03-27 DIAGNOSIS — Z1211 Encounter for screening for malignant neoplasm of colon: Secondary | ICD-10-CM | POA: Diagnosis not present

## 2016-03-27 DIAGNOSIS — Z872 Personal history of diseases of the skin and subcutaneous tissue: Secondary | ICD-10-CM | POA: Diagnosis not present

## 2016-03-27 DIAGNOSIS — Z9221 Personal history of antineoplastic chemotherapy: Secondary | ICD-10-CM | POA: Diagnosis not present

## 2016-03-27 DIAGNOSIS — Z980 Intestinal bypass and anastomosis status: Secondary | ICD-10-CM | POA: Diagnosis not present

## 2016-03-27 HISTORY — PX: COLONOSCOPY: SHX5424

## 2016-03-27 SURGERY — COLONOSCOPY
Anesthesia: General

## 2016-03-27 MED ORDER — SODIUM CHLORIDE 0.9 % IV SOLN
INTRAVENOUS | Status: DC
  Administered 2016-03-27: 1000 mL via INTRAVENOUS

## 2016-03-27 MED ORDER — PROPOFOL 500 MG/50ML IV EMUL
INTRAVENOUS | Status: DC | PRN
  Administered 2016-03-27: 140 ug/kg/min via INTRAVENOUS

## 2016-03-27 MED ORDER — LIDOCAINE HCL (CARDIAC) 20 MG/ML IV SOLN
INTRAVENOUS | Status: DC | PRN
  Administered 2016-03-27: 60 mg via INTRAVENOUS

## 2016-03-27 MED ORDER — PROPOFOL 10 MG/ML IV BOLUS
INTRAVENOUS | Status: DC | PRN
  Administered 2016-03-27: 40 mg via INTRAVENOUS

## 2016-03-27 NOTE — Anesthesia Postprocedure Evaluation (Signed)
Anesthesia Post Note  Patient: [name] [name]  Procedure(s) Performed: Procedure(s) (LRB): COLONOSCOPY (N/A)  Patient location during evaluation: Endoscopy Anesthesia Type: General Level of consciousness: awake and alert Pain management: pain level controlled Vital Signs Assessment: post-procedure vital signs reviewed and stable Respiratory status: spontaneous breathing, nonlabored ventilation, respiratory function stable and patient connected to nasal cannula oxygen Cardiovascular status: blood pressure returned to baseline and stable Postop Assessment: no signs of nausea or vomiting Anesthetic complications: no    Last Vitals:  Vitals:   03/27/16 1610 03/27/16 1620  BP: (!) 160/66 (!) 167/66  Pulse:    Resp:    Temp:      Last Pain:  Vitals:   03/27/16 1553  TempSrc: Tympanic                 [name] S

## 2016-03-27 NOTE — H&P (Signed)
No change in clinical history or exam. For colonoscopy. 

## 2016-03-27 NOTE — Op Note (Addendum)
[facility]astroenterology Patient Name: [name] [name] Procedure Date: 03/27/2016 3:30 PM MRN: [id number] Account #: [id number] Date of Birth: [birth year] Admit Type: Outpatient Age: 82 Room: [facility] ENDO ROOM 4 Gender: Female Note Status: Finalized Procedure:            Colonoscopy Indications:          Gluteal abscess Providers:            Robert Bellow, MD Referring MD:         Einar Pheasant, MD (Referring MD) Medicines:            Monitored Anesthesia Care Complications:        No immediate complications. Procedure:            Pre-Anesthesia Assessment:                       - Prior to the procedure, a History and Physical was                        performed, and patient medications, allergies and                        sensitivities were reviewed. The patient's tolerance of                        previous anesthesia was reviewed.                       - The risks and benefits of the procedure and the                        sedation options and risks were discussed with the                        patient. All questions were answered and informed                        consent was obtained.                       After obtaining informed consent, the colonoscope was                        passed under direct vision. Throughout the procedure,                        the patient's blood pressure, pulse, and oxygen                        saturations were monitored continuously. The                        Colonoscope was introduced through the anus and                        advanced to the the cecum, identified by appendiceal                        orifice and ileocecal valve. The colonoscopy was  performed without difficulty. The patient tolerated the                        procedure well. The quality of the bowel preparation                        was excellent. Findings:      The entire examined colon appeared normal on direct and  retroflexion       views. Impression:           - The entire examined colon is normal on direct and                        retroflexion views.                       - No specimens collected. Recommendation:       - Discharge patient to home (via wheelchair).                       - Return to endoscopist in 2 weeks. Procedure Code(s):    --- Professional ---                       [phone], Colonoscopy, flexible; diagnostic, including                        collection of specimen(s) by brushing or washing, when                        performed (separate procedure) CPT copyright 2016 American Medical Association. All rights reserved. The codes documented in this report are preliminary and upon coder review may  be revised to meet current compliance requirements. Robert Bellow, MD 03/27/2016 3:52:41 PM This report has been signed electronically. Number of Addenda: 0 Note Initiated On: 03/27/2016 3:30 PM Scope Withdrawal Time: 0 hours 8 minutes 8 seconds  Total Procedure Duration: 0 hours 12 minutes 17 seconds       [facility]

## 2016-03-27 NOTE — Transfer of Care (Signed)
Immediate Anesthesia Transfer of Care Note  Patient: [name] [name]  Procedure(s) Performed: Procedure(s): COLONOSCOPY (N/A)  Patient Location: Endoscopy Unit  Anesthesia Type:General  Level of Consciousness: awake, alert , oriented and patient cooperative  Airway & Oxygen Therapy: Patient Spontanous Breathing and Patient connected to nasal cannula oxygen  Post-op Assessment: Report given to RN, Post -op Vital signs reviewed and stable and Patient moving all extremities X 4  Post vital signs: Reviewed and stable  Last Vitals:  Vitals:   03/27/16 1457  BP: (!) 145/60  Pulse: 64  Resp: 20  Temp: (!) 36.1 C    Last Pain:  Vitals:   03/27/16 1457  TempSrc: Tympanic         Complications: No apparent anesthesia complications

## 2016-03-27 NOTE — Anesthesia Preprocedure Evaluation (Signed)
Anesthesia Evaluation  Patient identified by MRN, date of birth, ID band Patient awake    Reviewed: Allergy & Precautions, NPO status , Patient's Chart, lab work & pertinent test results, reviewed documented beta blocker date and time   Airway Mallampati: II  TM Distance: >3 FB     Dental  (+) Chipped   Pulmonary           Cardiovascular hypertension, Pt. on medications      Neuro/Psych    GI/Hepatic   Endo/Other    Renal/GU      Musculoskeletal   Abdominal   Peds  Hematology   Anesthesia Other Findings   Reproductive/Obstetrics                             Anesthesia Physical Anesthesia Plan  ASA: II  Anesthesia Plan: General   Post-op Pain Management:    Induction: Intravenous  Airway Management Planned:   Additional Equipment:   Intra-op Plan:   Post-operative Plan:   Informed Consent: I have reviewed the patients History and Physical, chart, labs and discussed the procedure including the risks, benefits and alternatives for the proposed anesthesia with the patient or authorized representative who has indicated his/her understanding and acceptance.     Plan Discussed with: CRNA  Anesthesia Plan Comments:         Anesthesia Quick Evaluation

## 2016-03-31 ENCOUNTER — Encounter: Payer: Self-pay | Admitting: General Surgery

## 2016-04-11 ENCOUNTER — Encounter: Payer: Self-pay | Admitting: General Surgery

## 2016-04-11 ENCOUNTER — Ambulatory Visit (INDEPENDENT_AMBULATORY_CARE_PROVIDER_SITE_OTHER): Payer: Medicare HMO | Admitting: General Surgery

## 2016-04-11 VITALS — BP 122/76 | HR 60 | Resp 12 | Ht 63.0 in | Wt 177.0 lb

## 2016-04-11 DIAGNOSIS — N739 Female pelvic inflammatory disease, unspecified: Secondary | ICD-10-CM

## 2016-04-11 NOTE — Patient Instructions (Addendum)
The patient is aware to call back for any questions or concerns.  Recommend no further colonoscopies providing she does not have any gastrointestinal issues, bleeding, or pain.

## 2016-04-11 NOTE — Progress Notes (Signed)
Patient ID: [name] [name], female   DOB: Jul 12, 1933, 82 y.o.   MRN: [id number]  Chief Complaint  Patient presents with  . Follow-up    Colonoscopy    HPI [name] [name] is a 82 y.o. female here today for her follow up from her colonoscopy done on 03/27/2016. Patient states  She is doing well. Denies any GI issues. Denies any drainage from abscessed area involving the gluteal musculature.Marland Kitchen  HPI  Past Medical History:  Diagnosis Date  . Diverticulitis of sigmoid colon   . Endometriosis   . Fibrocystic breast disease   . Glaucoma   . H/O urinary incontinence   . History of chicken pox   . History of colon polyps 2014  . Hyperlipidemia   . Hypertension   . Ovarian cancer ([place]) 2002   Chemo tx's @ [facility] with Total Hysterectomy. Dr Amalia Hailey  . Sinusitis   . Tuberculosis    positve skin test    Past Surgical History:  Procedure Laterality Date  . ABDOMINAL HYSTERECTOMY  2003   complete Dr Amalia Hailey  . ABSCESS DRAINAGE  2017   right buttock at [facility]  . APPENDECTOMY  2003  . BREAST BIOPSY Left 1993   excisional bx. negative results  . BREAST SURGERY Left 1982   papilloma removal  . COLONOSCOPY  2006, 2014   Dr Vira Agar   . COLONOSCOPY N/A 03/27/2016   Procedure: COLONOSCOPY;  Surgeon: Robert Bellow, MD;  [facility];  Service: Endoscopy;  Laterality: N/A;  . COLOSTOMY REVERSAL  2014   at [place] Dr Raynaldo Opitz  . NASAL SINUS SURGERY     For fungal infection  . NASAL SINUS SURGERY  8/05 8/06  . OSTOMY  05/22/2012   diverticulitis with abcess/ Dr Tamala Julian  . ovarian cancer     Exploratory lap  . TONSILLECTOMY AND ADENOIDECTOMY  1947    Family History  Problem Relation Age of Onset  . Hypertension Mother   . Coronary artery disease Mother   . Heart disease Mother   . Kidney disease Mother   . Diabetes Mother   . Cancer Mother     uterine cancer  . Diabetes Father   . Kidney failure Father   . Diabetes Brother   . Coronary artery disease Brother   .  Cancer Brother     bladder cancer  . Stroke Sister   . Heart disease Sister     [facility]  . Cancer Maternal Aunt     ovarian cancer  . Breast cancer Cousin   . Cancer Brother     Prostate    Social History Social History  Substance Use Topics  . Smoking status: Never Smoker  . Smokeless tobacco: Never Used  . Alcohol use No    No Known Allergies  Current Outpatient Prescriptions  Medication Sig Dispense Refill  . Calcium Carbonate-Vit D-Min (CALCIUM 600 + MINERALS) 600-200 MG-UNIT TABS Take by mouth once. Calcium 600 + Minerals (calcium carbonate-vit d3-min) 600 mg calcium- 200 unit     No current facility-administered medications for this visit.     Review of Systems Review of Systems  Constitutional: Negative.   Respiratory: Negative.   Cardiovascular: Negative.   Gastrointestinal: Negative for constipation, nausea and vomiting.    Blood pressure 122/76, pulse 60, resp. rate 12, height 5\' 3"  (1.6 m), weight 177 lb (80.3 kg).  Physical Exam Physical Exam  Constitutional: She is oriented to person, place, and time. She appears well-developed and well-nourished.  Neurological: She is alert and oriented to person, place, and time.  Skin: Skin is warm and dry.  Psychiatric: Her behavior is normal.    Data Reviewed Colonoscopy showed a healthy anastomosis without evidence of inflammation or polyps.  Assessment    No source for her prior gluteal abscess identified on recent colonoscopy.    Plan    The patient was encouraged to call promptly if she developed recurrent gluteal swelling, follow up otherwise will be on an as-needed basis.     Recommend no further colonoscopies providing she does not have any gastrointestinal issues, bleeding, or pain.    This information has been scribed by Karie Fetch RN, BSN,BC.    Robert Bellow 04/11/2016, 9:29 PM

## 2016-08-22 ENCOUNTER — Encounter: Payer: Self-pay | Admitting: Internal Medicine

## 2016-08-22 ENCOUNTER — Ambulatory Visit (INDEPENDENT_AMBULATORY_CARE_PROVIDER_SITE_OTHER): Payer: Medicare HMO | Admitting: Internal Medicine

## 2016-08-22 VITALS — BP 120/68 | HR 64 | Temp 98.3°F | Resp 16 | Ht 63.0 in | Wt 171.2 lb

## 2016-08-22 DIAGNOSIS — Z8543 Personal history of malignant neoplasm of ovary: Secondary | ICD-10-CM | POA: Diagnosis not present

## 2016-08-22 DIAGNOSIS — K4090 Unilateral inguinal hernia, without obstruction or gangrene, not specified as recurrent: Secondary | ICD-10-CM

## 2016-08-22 DIAGNOSIS — E7800 Pure hypercholesterolemia, unspecified: Secondary | ICD-10-CM

## 2016-08-22 DIAGNOSIS — I10 Essential (primary) hypertension: Secondary | ICD-10-CM | POA: Diagnosis not present

## 2016-08-22 NOTE — Progress Notes (Signed)
Patient ID: [name] [name], female   DOB: [birth year], 83 y.o.   MRN: [id number]   Subjective:    Patient ID: [name] [name], female    DOB: [birth year], 83 y.o.   MRN: [id number]  HPI  Patient here for a scheduled follow up. Followed by oncology at [facility].  Has a history of ovarian cancer.  She reports she is doing relatively well.  No chest pain.  No sob.  No acid reflux.  No abdominal pain.  Bowels moving.  She reports a sloshing sensation in her lower abdomen.  Notices if pushes in on her lower abdomen.  No pain.      Past Medical History:  Diagnosis Date  . Diverticulitis of sigmoid colon   . Endometriosis   . Fibrocystic breast disease   . Glaucoma   . H/O urinary incontinence   . History of chicken pox   . History of colon polyps 2014  . Hyperlipidemia   . Hypertension   . Ovarian cancer ([place]) 2002   Chemo tx's @ [facility] with Total Hysterectomy. Dr Amalia Hailey  . Sinusitis   . Tuberculosis    positve skin test   Past Surgical History:  Procedure Laterality Date  . ABDOMINAL HYSTERECTOMY  2003   complete Dr Amalia Hailey  . ABSCESS DRAINAGE  2017   right buttock at [facility]  . APPENDECTOMY  2003  . BREAST BIOPSY Left 1993   excisional bx. negative results  . BREAST SURGERY Left 1982   papilloma removal  . COLONOSCOPY  2006, 2014   Dr Vira Agar   . COLONOSCOPY N/A 03/27/2016   Procedure: COLONOSCOPY;  Surgeon: Robert Bellow, MD;  Location: [facility] ENDOSCOPY;  Service: Endoscopy;  Laterality: N/A;  . COLOSTOMY REVERSAL  2014   at [place] [facility] Raynaldo Opitz  . NASAL SINUS SURGERY     For fungal infection  . NASAL SINUS SURGERY  8/05 8/06  . OSTOMY  05/22/2012   diverticulitis with abcess/ Dr Tamala Julian  . ovarian cancer     Exploratory lap  . TONSILLECTOMY AND ADENOIDECTOMY  1947   Family History  Problem Relation Age of Onset  . Hypertension Mother   . Coronary artery disease Mother   . Heart disease Mother   . Kidney disease Mother   . Diabetes Mother   . Cancer Mother     uterine  cancer  . Diabetes Father   . Kidney failure Father   . Diabetes Brother   . Coronary artery disease Brother   . Cancer Brother     bladder cancer  . Stroke Sister   . Heart disease Sister     [facility]  . Cancer Maternal Aunt     ovarian cancer  . Breast cancer Cousin   . Cancer Brother     Prostate   Social History   Social History  . Marital status: Widowed    Spouse name: N/A  . Number of children: 0  . Years of education: N/A   Social History Main Topics  . Smoking status: Never Smoker  . Smokeless tobacco: Never Used  . Alcohol use No  . Drug use: No  . Sexual activity: Not Currently   Other Topics Concern  . None   Social History Narrative  . None    Outpatient Encounter Prescriptions as of 08/22/2016  Medication Sig  . Calcium Carbonate-Vit D-Min (CALCIUM 600 + MINERALS) 600-200 MG-UNIT TABS Take by mouth once. Calcium 600 + Minerals (calcium carbonate-vit d3-min) 600 mg calcium-  200 unit   No facility-administered encounter medications on file as of 08/22/2016.     Review of Systems  Constitutional: Negative for appetite change and unexpected weight change.  HENT: Negative for congestion and sinus pressure.   Respiratory: Negative for cough, chest tightness and shortness of breath.   Cardiovascular: Negative for chest pain, palpitations and leg swelling.  Gastrointestinal: Negative for abdominal pain, diarrhea, nausea and vomiting.  Genitourinary: Negative for difficulty urinating and dysuria.  Musculoskeletal: Negative for back pain and joint swelling.  Skin: Negative for color change and rash.  Neurological: Negative for dizziness, light-headedness and headaches.  Psychiatric/Behavioral: Negative for agitation and dysphoric mood.       Objective:    Physical Exam  Constitutional: She appears well-developed and well-nourished. No distress.  HENT:  Nose: Nose normal.  Mouth/Throat: Oropharynx is clear and moist.  Neck: Neck supple. No  thyromegaly present.  Cardiovascular: Normal rate and regular rhythm.   Pulmonary/Chest: Breath sounds normal. No respiratory distress. She has no wheezes.  Abdominal: Soft. Bowel sounds are normal. There is no tenderness.  Question of hernia - lower abdomen/inguinal region.  No pain.    Musculoskeletal: She exhibits no edema or tenderness.  Lymphadenopathy:    She has no cervical adenopathy.  Skin: No rash noted. No erythema.  Psychiatric: She has a normal mood and affect. Her behavior is normal.    BP 120/68 (BP Location: Left Arm, Patient Position: Sitting, Cuff Size: Normal)   Pulse 64   Temp 98.3 F (36.8 C) (Oral)   Resp 16   Ht 5\' 3"  (1.6 m)   Wt 171 lb 3.2 oz (77.7 kg)   SpO2 98%   BMI 30.33 kg/m  Wt Readings from Last 3 Encounters:  08/22/16 171 lb 3.2 oz (77.7 kg)  04/11/16 177 lb (80.3 kg)  03/08/16 174 lb (78.9 kg)     Lab Results  Component Value Date   WBC 7.2 02/08/2016   HGB 13.8 02/08/2016   HCT 40.8 02/08/2016   PLT 247.0 02/08/2016   GLUCOSE 96 02/08/2016   CHOL 161 02/08/2016   TRIG 142.0 02/08/2016   HDL 53.50 02/08/2016   LDLCALC 79 02/08/2016   ALT 19 02/08/2016   AST 25 02/08/2016   NA 141 02/08/2016   K 4.5 02/08/2016   CL 106 02/08/2016   CREATININE 0.74 02/08/2016   BUN 11 02/08/2016   CO2 31 02/08/2016   TSH 1.33 02/08/2016   INR 1.0 05/21/2012       Assessment & Plan:   Problem List Items Addressed This Visit    Essential hypertension, benign    Blood pressure under good control.  Continue same medication regimen.  Follow pressures.  Follow metabolic panel.        History of ovarian cancer    Has been followed by oncology at [facility].        Hypercholesterolemia    Low cholesterol diet and exercise.  Follow lipid panel.         Other Visit Diagnoses    Non-recurrent inguinal hernia without obstruction or gangrene, unspecified laterality    -  Primary   question of hernia.  no pain.  discussed further evaluation.   declines.  wants to follow.         [name] Pheasant, MD

## 2016-08-22 NOTE — Progress Notes (Signed)
Pre-visit discussion using our clinic review tool. No additional management support is needed unless otherwise documented below in the visit note.  

## 2016-08-26 ENCOUNTER — Encounter: Payer: Self-pay | Admitting: Internal Medicine

## 2016-08-26 NOTE — Assessment & Plan Note (Signed)
Has been followed by oncology at [facility].  

## 2016-08-26 NOTE — Assessment & Plan Note (Signed)
Low cholesterol diet and exercise.  Follow lipid panel.   

## 2016-08-26 NOTE — Assessment & Plan Note (Signed)
Blood pressure under good control.  Continue same medication regimen.  Follow pressures.  Follow metabolic panel.   

## 2016-12-28 ENCOUNTER — Other Ambulatory Visit: Payer: Self-pay | Admitting: Internal Medicine

## 2016-12-28 DIAGNOSIS — Z1231 Encounter for screening mammogram for malignant neoplasm of breast: Secondary | ICD-10-CM

## 2017-01-16 ENCOUNTER — Ambulatory Visit
Admission: RE | Admit: 2017-01-16 | Discharge: 2017-01-16 | Disposition: A | Discharge status: Home / Self Care | Payer: Medicare HMO | Source: Ambulatory Visit | Attending: Internal Medicine | Admitting: Internal Medicine

## 2017-01-16 DIAGNOSIS — Z1231 Encounter for screening mammogram for malignant neoplasm of breast: Secondary | ICD-10-CM | POA: Insufficient documentation

## 2017-02-12 ENCOUNTER — Telehealth: Payer: Self-pay | Admitting: Internal Medicine

## 2017-02-12 DIAGNOSIS — B070 Plantar wart: Secondary | ICD-10-CM | POA: Diagnosis not present

## 2017-02-12 DIAGNOSIS — M7989 Other specified soft tissue disorders: Secondary | ICD-10-CM | POA: Diagnosis not present

## 2017-02-12 NOTE — Telephone Encounter (Signed)
Pt called and stated that she is having rt foot swelling and soreness on the bottom of her foot. Pt states that it has been like this for a couple of weeks. She states that it might be a wart or has stepped on something, it is underneath the middle toe. Please advise, thank you !  Call pt @ [phone] (Pt will be available after 11:30 this morning)

## 2017-02-12 NOTE — Telephone Encounter (Signed)
Left message to return call to office.

## 2017-02-12 NOTE — Telephone Encounter (Signed)
Patient stated she would go to [facility]. Will follow up to see if she did.

## 2017-02-12 NOTE — Telephone Encounter (Signed)
If redness and swelling, agree with need for evaluation.  Needs to be seen.

## 2017-02-12 NOTE — Telephone Encounter (Signed)
Patient thinks she may have a planters wart or stepped on something. Patient has had this a couple weeks. The place on her foot is not open but has a white center with some redness around it. It is closer to her toes but her foot is swollen all the way up to the ankle. Advised patient to go to walk in clinic to be evaluated instead of waiting to be seen by you. Patient stated she would comply.

## 2017-02-12 NOTE — Telephone Encounter (Signed)
Noted! Thank you

## 2017-03-06 ENCOUNTER — Ambulatory Visit (INDEPENDENT_AMBULATORY_CARE_PROVIDER_SITE_OTHER): Payer: Medicare HMO | Admitting: Internal Medicine

## 2017-03-06 ENCOUNTER — Encounter: Payer: Self-pay | Admitting: Internal Medicine

## 2017-03-06 VITALS — BP 126/74 | HR 65 | Temp 97.9°F | Resp 14 | Ht 63.0 in | Wt 168.6 lb

## 2017-03-06 DIAGNOSIS — E7800 Pure hypercholesterolemia, unspecified: Secondary | ICD-10-CM

## 2017-03-06 DIAGNOSIS — I10 Essential (primary) hypertension: Secondary | ICD-10-CM | POA: Diagnosis not present

## 2017-03-06 DIAGNOSIS — Z8543 Personal history of malignant neoplasm of ovary: Secondary | ICD-10-CM | POA: Diagnosis not present

## 2017-03-06 DIAGNOSIS — E2839 Other primary ovarian failure: Secondary | ICD-10-CM

## 2017-03-06 DIAGNOSIS — Z86010 Personal history of colonic polyps: Secondary | ICD-10-CM

## 2017-03-06 DIAGNOSIS — R1031 Right lower quadrant pain: Secondary | ICD-10-CM

## 2017-03-06 DIAGNOSIS — Z0000 Encounter for general adult medical examination without abnormal findings: Secondary | ICD-10-CM | POA: Diagnosis not present

## 2017-03-06 LAB — LIPID PANEL
CHOLESTEROL: 166 mg/dL (ref 0–200)
HDL: 52.4 mg/dL (ref >39.00)
LDL CALC: 83 mg/dL (ref 0–99)
NonHDL: 113.76
Total CHOL/HDL Ratio: 3
Triglycerides: 154 mg/dL — ABNORMAL HIGH (ref 0.0–149.0)
VLDL: 30.8 mg/dL (ref 0.0–40.0)

## 2017-03-06 LAB — CBC WITH DIFFERENTIAL/PLATELET
BASOS PCT: 0.4 % (ref 0.0–3.0)
Basophils Absolute: 0 10*3/uL (ref 0.0–0.1)
EOS ABS: 0.1 10*3/uL (ref 0.0–0.7)
EOS PCT: 0.6 % (ref 0.0–5.0)
HCT: 40.5 % (ref 36.0–46.0)
HEMOGLOBIN: 13.1 g/dL (ref 12.0–15.0)
LYMPHS ABS: 2.4 10*3/uL (ref 0.7–4.0)
Lymphocytes Relative: 27.6 % (ref 12.0–46.0)
MCHC: 32.4 g/dL (ref 30.0–36.0)
MCV: 94.6 fl (ref 78.0–100.0)
MONO ABS: 0.6 10*3/uL (ref 0.1–1.0)
Monocytes Relative: 7.3 % (ref 3.0–12.0)
NEUTROS ABS: 5.5 10*3/uL (ref 1.4–7.7)
Neutrophils Relative %: 64.1 % (ref 43.0–77.0)
PLATELETS: 251 10*3/uL (ref 150.0–400.0)
RBC: 4.28 Mil/uL (ref 3.87–5.11)
RDW: 13.8 % (ref 11.5–15.5)
WBC: 8.5 10*3/uL (ref 4.0–10.5)

## 2017-03-06 LAB — COMPREHENSIVE METABOLIC PANEL
ALBUMIN: 3.6 g/dL (ref 3.5–5.2)
ALT: 20 U/L (ref 0–35)
AST: 20 U/L (ref 0–37)
Alkaline Phosphatase: 93 U/L (ref 39–117)
BUN: 12 mg/dL (ref 6–23)
CHLORIDE: 108 meq/L (ref 96–112)
CO2: 29 meq/L (ref 19–32)
Calcium: 9 mg/dL (ref 8.4–10.5)
Creatinine, Ser: 0.63 mg/dL (ref 0.40–1.20)
GFR: 96.4 mL/min (ref >60.00)
Glucose, Bld: 96 mg/dL (ref 70–99)
POTASSIUM: 4.4 meq/L (ref 3.5–5.1)
SODIUM: 141 meq/L (ref 135–145)
Total Bilirubin: 0.5 mg/dL (ref 0.2–1.2)
Total Protein: 6.1 g/dL (ref 6.0–8.3)

## 2017-03-06 LAB — TSH: TSH: 1.9 u[IU]/mL (ref 0.35–4.50)

## 2017-03-06 NOTE — Progress Notes (Signed)
Patient ID: [name] [name], female   DOB: 12/05/1933, 83 y.o.   MRN: [id number]   Subjective:    Patient ID: [name] [name], female    DOB: 07-08-1933, 83 y.o.   MRN: [id number]  HPI  Patient here for her physical exam. She reports she is doing relatively well.  She has a h/o Hartmann's in 2013 for diverticular perforation.  Had a colostomy TD by Dr Lynelle Smoke in 03/2013.  Developed a presacral abscess in 2017.  Had a drain placed and f/u enema and drain study that did not reveal an anastomotic leak. Drain d/c'd.  She has done relatively well.  Had colonoscopy 03/2016 - normal.  She reports she is still having some right lower abdominal discomfort (twinges) at times.  Bowels moving.  No nausea or vomiting.  She request a referral back to Dr Redgie Grayer for reevaluation.  States she just wants to confirm ok.  She is staying active.  Walking.  No chest pain.  Breathing stable.  Decreased weight.  She feels is related to increased activity.  She is eating.     Past Medical History:  Diagnosis Date  . Diverticulitis of sigmoid colon   . Endometriosis   . Fibrocystic breast disease   . Glaucoma   . H/O urinary incontinence   . History of chicken pox   . History of colon polyps 2014  . Hyperlipidemia   . Hypertension   . Ovarian cancer ([place]) 2002   Chemo tx's @ [facility] with Total Hysterectomy. Dr Amalia Hailey  . Sinusitis   . Tuberculosis    positve skin test   Past Surgical History:  Procedure Laterality Date  . ABDOMINAL HYSTERECTOMY  2003   complete Dr Amalia Hailey  . ABSCESS DRAINAGE  2017   right buttock at [facility]  . APPENDECTOMY  2003  . BREAST BIOPSY Left 1993   excisional bx. negative results  . BREAST SURGERY Left 1982   papilloma removal  . COLONOSCOPY  2006, 2014   Dr Vira Agar   . COLONOSCOPY N/A 03/27/2016   Procedure: COLONOSCOPY;  Surgeon: Robert Bellow, MD;  Location: [facility] ENDOSCOPY;  Service: Endoscopy;  Laterality: N/A;  . COLOSTOMY REVERSAL  2014   at [place] Dr Raynaldo Opitz  .  NASAL SINUS SURGERY     For fungal infection  . NASAL SINUS SURGERY  8/05 8/06  . OSTOMY  05/22/2012   diverticulitis with abcess/ Dr Tamala Julian  . ovarian cancer     Exploratory lap  . TONSILLECTOMY AND ADENOIDECTOMY  1947   Family History  Problem Relation Age of Onset  . Hypertension Mother   . Coronary artery disease Mother   . Heart disease Mother   . Kidney disease Mother   . Diabetes Mother   . Cancer Mother        uterine cancer  . Diabetes Father   . Kidney failure Father   . Diabetes Brother   . Coronary artery disease Brother   . Cancer Brother        bladder cancer  . Stroke Sister   . Heart disease Sister        [facility]  . Cancer Maternal Aunt        ovarian cancer  . Breast cancer Cousin   . Cancer Brother        Prostate  . Breast cancer Cousin    Social History   Social History  . Marital status: Widowed    Spouse name: N/A  .  Number of children: 0  . Years of education: N/A   Social History Main Topics  . Smoking status: Never Smoker  . Smokeless tobacco: Never Used  . Alcohol use No  . Drug use: No  . Sexual activity: Not Currently   Other Topics Concern  . None   Social History Narrative  . None    Outpatient Encounter Prescriptions as of 03/06/2017  Medication Sig  . [DISCONTINUED] Calcium Carbonate-Vit D-Min (CALCIUM 600 + MINERALS) 600-200 MG-UNIT TABS Take by mouth once. Calcium 600 + Minerals (calcium carbonate-vit d3-min) 600 mg calcium- 200 unit   No facility-administered encounter medications on file as of 03/06/2017.     Review of Systems  Constitutional: Negative for appetite change and unexpected weight change.  HENT: Negative for congestion and sinus pressure.   Eyes: Negative for pain and visual disturbance.  Respiratory: Negative for cough, chest tightness and shortness of breath.   Cardiovascular: Negative for chest pain, palpitations and leg swelling.  Gastrointestinal: Negative for diarrhea, nausea and vomiting.        Right lower quadrant discomfort - at times. (twinges of discomfort).   Genitourinary: Negative for difficulty urinating and dysuria.  Musculoskeletal: Negative for back pain and joint swelling.  Skin: Negative for color change and rash.  Neurological: Negative for dizziness, light-headedness and headaches.  Hematological: Negative for adenopathy. Does not bruise/bleed easily.  Psychiatric/Behavioral: Negative for agitation and dysphoric mood.       Objective:     Blood pressure rechecked by me:  128/72  Physical Exam  Constitutional: She is oriented to person, place, and time. She appears well-developed and well-nourished. No distress.  HENT:  Nose: Nose normal.  Mouth/Throat: Oropharynx is clear and moist.  Eyes: Right eye exhibits no discharge. Left eye exhibits no discharge. No scleral icterus.  Neck: Neck supple. No thyromegaly present.  Cardiovascular: Normal rate and regular rhythm.   Pulmonary/Chest: Breath sounds normal. No accessory muscle usage. No tachypnea. No respiratory distress. She has no decreased breath sounds. She has no wheezes. She has no rhonchi. Right breast exhibits no inverted nipple, no mass, no nipple discharge and no tenderness (no axillary adenopathy). Left breast exhibits no inverted nipple, no mass, no nipple discharge and no tenderness (no axilarry adenopathy).  Abdominal: Soft. Bowel sounds are normal. There is no tenderness.  Musculoskeletal: She exhibits no edema or tenderness.  Lymphadenopathy:    She has no cervical adenopathy.  Neurological: She is alert and oriented to person, place, and time.  Skin: Skin is warm. No rash noted. No erythema.  Psychiatric: She has a normal mood and affect. Her behavior is normal.    BP 126/74 (BP Location: Left Arm, Patient Position: Sitting, Cuff Size: Normal)   Pulse 65   Temp 97.9 F (36.6 C) (Oral)   Resp 14   Ht 5\' 3"  (1.6 m)   Wt 168 lb 9.6 oz (76.5 kg)   SpO2 97%   BMI 29.87 kg/m  Wt Readings  from Last 3 Encounters:  03/06/17 168 lb 9.6 oz (76.5 kg)  08/22/16 171 lb 3.2 oz (77.7 kg)  04/11/16 177 lb (80.3 kg)     Lab Results  Component Value Date   WBC 8.5 03/06/2017   HGB 13.1 03/06/2017   HCT 40.5 03/06/2017   PLT 251.0 03/06/2017   GLUCOSE 96 03/06/2017   CHOL 166 03/06/2017   TRIG 154.0 (H) 03/06/2017   HDL 52.40 03/06/2017   LDLCALC 83 03/06/2017   ALT 20 03/06/2017  AST 20 03/06/2017   NA 141 03/06/2017   K 4.4 03/06/2017   CL 108 03/06/2017   CREATININE 0.63 03/06/2017   BUN 12 03/06/2017   CO2 29 03/06/2017   TSH 1.90 03/06/2017   INR 1.0 05/21/2012    Mm Screening Breast Tomo Bilateral  Result Date: 01/17/2017 CLINICAL DATA:  Screening. EXAM: 2D DIGITAL SCREENING BILATERAL MAMMOGRAM WITH CAD AND ADJUNCT TOMO COMPARISON:  Previous exam(s). ACR Breast Density Category c: The breast tissue is heterogeneously dense, which may obscure small masses. FINDINGS: There are no findings suspicious for malignancy. Images were processed with CAD. IMPRESSION: No mammographic evidence of malignancy. A result letter of this screening mammogram will be mailed directly to the patient. RECOMMENDATION: Screening mammogram in one year. (Code:SM-B-01Y) BI-RADS CATEGORY  1: Negative. Electronically Signed   By: Nolon Nations M.D.   On: 01/17/2017 08:41       Assessment & Plan:   Problem List Items Addressed This Visit    Abdominal pain    Minimal discomfort in RLQ as outlined.  Colonoscopy 03/2016 - normal.  Previous abscess and drainage as outlined.  Has been doing well.  She is concerned because she still feels some discomfort.  She request referral back to Dr Redgie Grayer for reevaluation and question of need for further evaluation.        Relevant Orders   Ambulatory referral to Oncology   Essential hypertension, benign - Primary    Blood pressure under good control.  Continue same medication regimen.  Follow pressures.  Follow metabolic panel.        Relevant  Orders   CBC with Differential/Platelet (Completed)   Comprehensive metabolic panel (Completed)   TSH (Completed)   Health care maintenance    Physical today 03/06/17.  Mammogram 01/17/17 - Birads I.  Colonoscopy 03/27/16.        History of colonic polyps    Had colonoscopy 03/2016 - normal.        History of ovarian cancer    Has been followed by oncology at [facility].       Hypercholesterolemia    Low cholesterol diet and exercise.  Follow lipid panel.        Relevant Orders   Lipid panel (Completed)    Other Visit Diagnoses    Estrogen deficiency       Relevant Orders   DG Bone Density       Einar Pheasant, MD

## 2017-03-06 NOTE — Assessment & Plan Note (Signed)
Physical today 03/06/17.  Mammogram 01/17/17 - Birads I.  Colonoscopy 03/27/16.

## 2017-03-07 ENCOUNTER — Encounter: Payer: Self-pay | Admitting: Internal Medicine

## 2017-03-09 ENCOUNTER — Encounter: Payer: Self-pay | Admitting: Internal Medicine

## 2017-03-09 NOTE — Assessment & Plan Note (Signed)
Minimal discomfort in RLQ as outlined.  Colonoscopy 03/2016 - normal.  Previous abscess and drainage as outlined.  Has been doing well.  She is concerned because she still feels some discomfort.  She request referral back to Dr Redgie Grayer for reevaluation and question of need for further evaluation.

## 2017-03-09 NOTE — Assessment & Plan Note (Signed)
Had colonoscopy 03/2016 - normal.

## 2017-03-09 NOTE — Assessment & Plan Note (Signed)
Has been followed by oncology at [facility].

## 2017-03-09 NOTE — Assessment & Plan Note (Signed)
Low cholesterol diet and exercise.  Follow lipid panel.   

## 2017-03-09 NOTE — Assessment & Plan Note (Signed)
Blood pressure under good control.  Continue same medication regimen.  Follow pressures.  Follow metabolic panel.   

## 2017-03-11 NOTE — Telephone Encounter (Signed)
Pt called wanting to get her lab results pt states she cannot access her Mychart. Please advise?  Call pt @ [phone]. Thank you!

## 2017-03-14 NOTE — Telephone Encounter (Signed)
Hard copy mailed  

## 2017-03-14 NOTE — Telephone Encounter (Signed)
Patient called results given. She also wanted to let you know that the doctor that she is seeing at [place] is Dr Kem Parkinson

## 2017-03-14 NOTE — Telephone Encounter (Signed)
Noted.  Do we need to do anything regarding this?  If no, ok to file.

## 2017-04-01 ENCOUNTER — Ambulatory Visit
Admission: RE | Admit: 2017-04-01 | Discharge: 2017-04-01 | Disposition: A | Discharge status: Home / Self Care | Payer: Medicare HMO | Source: Ambulatory Visit | Attending: Internal Medicine | Admitting: Internal Medicine

## 2017-04-01 ENCOUNTER — Telehealth: Payer: Self-pay | Admitting: Internal Medicine

## 2017-04-01 DIAGNOSIS — E2839 Other primary ovarian failure: Secondary | ICD-10-CM | POA: Diagnosis not present

## 2017-04-01 DIAGNOSIS — M25561 Pain in right knee: Secondary | ICD-10-CM

## 2017-04-01 DIAGNOSIS — M810 Age-related osteoporosis without current pathological fracture: Secondary | ICD-10-CM | POA: Diagnosis not present

## 2017-04-01 DIAGNOSIS — M25562 Pain in left knee: Principal | ICD-10-CM

## 2017-04-01 NOTE — Telephone Encounter (Signed)
Left message to return call to our office.  

## 2017-04-01 NOTE — Telephone Encounter (Signed)
Pt is still having leg and knee pain and wanted to she if she could be referred to someone  Please advise

## 2017-04-01 NOTE — Telephone Encounter (Signed)
Pt called states that three days after she was seen by our office started having b/l knee pain Right  more than left Pain is going from groin into knee. States that she does have increased pain with walking and can hear pop in the knee. She is using heating bad. Had Dexa today.

## 2017-04-02 NOTE — Telephone Encounter (Signed)
Reviewed her messages.  States desires referral.  I can refer her to ortho if this is what she desires.  Does she have a preference of which orthopedist she sees.

## 2017-04-02 NOTE — Telephone Encounter (Signed)
Spoke to patient she would like someone locally that you recommend.

## 2017-04-02 NOTE — Telephone Encounter (Signed)
Order placed for ortho referral.   

## 2017-04-10 DIAGNOSIS — M1712 Unilateral primary osteoarthritis, left knee: Secondary | ICD-10-CM | POA: Diagnosis not present

## 2017-04-10 DIAGNOSIS — M1711 Unilateral primary osteoarthritis, right knee: Secondary | ICD-10-CM | POA: Diagnosis not present

## 2017-04-10 DIAGNOSIS — M25561 Pain in right knee: Secondary | ICD-10-CM | POA: Diagnosis not present

## 2017-04-10 DIAGNOSIS — H2513 Age-related nuclear cataract, bilateral: Secondary | ICD-10-CM | POA: Diagnosis not present

## 2017-04-15 DIAGNOSIS — Z8719 Personal history of other diseases of the digestive system: Secondary | ICD-10-CM | POA: Diagnosis not present

## 2017-04-15 DIAGNOSIS — R1032 Left lower quadrant pain: Secondary | ICD-10-CM | POA: Diagnosis not present

## 2017-04-15 DIAGNOSIS — N739 Female pelvic inflammatory disease, unspecified: Secondary | ICD-10-CM | POA: Diagnosis not present

## 2017-04-15 DIAGNOSIS — Z87898 Personal history of other specified conditions: Secondary | ICD-10-CM | POA: Diagnosis not present

## 2017-04-15 DIAGNOSIS — K8020 Calculus of gallbladder without cholecystitis without obstruction: Secondary | ICD-10-CM | POA: Diagnosis not present

## 2017-04-18 DIAGNOSIS — K5792 Diverticulitis of intestine, part unspecified, without perforation or abscess without bleeding: Secondary | ICD-10-CM | POA: Diagnosis not present

## 2017-06-12 ENCOUNTER — Ambulatory Visit (INDEPENDENT_AMBULATORY_CARE_PROVIDER_SITE_OTHER): Payer: Medicare HMO | Admitting: Internal Medicine

## 2017-06-12 ENCOUNTER — Encounter: Payer: Self-pay | Admitting: Internal Medicine

## 2017-06-12 DIAGNOSIS — E7800 Pure hypercholesterolemia, unspecified: Secondary | ICD-10-CM | POA: Diagnosis not present

## 2017-06-12 DIAGNOSIS — Z8543 Personal history of malignant neoplasm of ovary: Secondary | ICD-10-CM | POA: Diagnosis not present

## 2017-06-12 DIAGNOSIS — R011 Cardiac murmur, unspecified: Secondary | ICD-10-CM | POA: Diagnosis not present

## 2017-06-12 DIAGNOSIS — Z86010 Personal history of colonic polyps: Secondary | ICD-10-CM

## 2017-06-12 DIAGNOSIS — F439 Reaction to severe stress, unspecified: Secondary | ICD-10-CM | POA: Diagnosis not present

## 2017-06-12 DIAGNOSIS — R030 Elevated blood-pressure reading, without diagnosis of hypertension: Secondary | ICD-10-CM

## 2017-06-12 DIAGNOSIS — R1031 Right lower quadrant pain: Secondary | ICD-10-CM

## 2017-06-12 DIAGNOSIS — R69 Illness, unspecified: Secondary | ICD-10-CM | POA: Diagnosis not present

## 2017-06-12 NOTE — Progress Notes (Addendum)
Patient ID: [name] [name], female   DOB: 16-Jun-1933, 84 y.o.   MRN: [id number]   Subjective:    Patient ID: [name] [name], female    DOB: [birth year], 84 y.o.   MRN: [id number]  HPI  Patient here for a scheduled follow up.  She reports she is doing relatively well.  Saw [name] [name] [name].  Is s/p joint aspiration and injection - right knee.  Doing better.  Helped.  Does not feel needs any further intervention at this time.  Does not limit her activity.  Tries to stay active.  No chest pain.  No sob.  No acid reflux.  Saw [name] [name] [name] assistant for her abdominal pain.  Has had recent colonoscopy and CT as outlined previously.  Felt no further w/up warranted.  Overall stable and she feels she is doing relatively well.  Not checking her pressures.     Past Medical History:  Diagnosis Date  . Diverticulitis of sigmoid colon   . Endometriosis   . Fibrocystic breast disease   . Glaucoma   . H/O urinary incontinence   . History of chicken pox   . History of colon polyps 2014  . Hyperlipidemia   . Hypertension   . Ovarian cancer ([place]) 2002   Chemo tx's @ [facility] with Total Hysterectomy. [name] [name] [name]  . Sinusitis   . Tuberculosis    positve skin test   Past Surgical History:  Procedure Laterality Date  . ABDOMINAL HYSTERECTOMY  2003   complete [name] [name] [name]  . ABSCESS DRAINAGE  2017   right buttock at [facility]  . APPENDECTOMY  2003  . BREAST BIOPSY Left 1993   excisional bx. negative results  . BREAST SURGERY Left 1982   papilloma removal  . COLONOSCOPY  2006, 2014   [name] [name] [name]   . COLONOSCOPY N/A 03/27/2016   Procedure: COLONOSCOPY;  Surgeon: [name] Bellow, MD;  Location: [facility] ENDOSCOPY;  Service: Endoscopy;  Laterality: N/A;  . COLOSTOMY REVERSAL  2014   at [place] [name] [name]  . NASAL SINUS SURGERY     For fungal infection  . NASAL SINUS SURGERY  8/05 8/06  . OSTOMY  05/22/2012   diverticulitis with abcess/ [name] [name] Julian  . ovarian cancer     Exploratory lap  . TONSILLECTOMY AND  ADENOIDECTOMY  1947   Family History  Problem Relation Age of Onset  . Hypertension Mother   . Coronary artery disease Mother   . Heart disease Mother   . Kidney disease Mother   . Diabetes Mother   . Cancer Mother        uterine cancer  . Diabetes Father   . Kidney failure Father   . Diabetes Brother   . Coronary artery disease Brother   . Cancer Brother        bladder cancer  . Stroke Sister   . Heart disease Sister        [facility]  . Cancer Maternal Aunt        ovarian cancer  . Breast cancer Cousin   . Cancer Brother        Prostate  . Breast cancer Cousin    Social History   Socioeconomic History  . Marital status: Widowed    Spouse name: None  . Number of children: 0  . Years of education: None  . Highest education level: None  Social Needs  . Financial resource strain: None  . Food insecurity - worry: None  . Food insecurity -  inability: None  . Transportation needs - medical: None  . Transportation needs - non-medical: None  Occupational History  . None  Tobacco Use  . Smoking status: Never Smoker  . Smokeless tobacco: Never Used  Substance and Sexual Activity  . Alcohol use: No    Alcohol/week: 0.0 oz  . Drug use: No  . Sexual activity: Not Currently  Other Topics Concern  . None  Social History Narrative  . None    No outpatient encounter medications on file as of 06/12/2017.   No facility-administered encounter medications on file as of 06/12/2017.     Review of Systems  Constitutional: Negative for appetite change and unexpected weight change.  HENT: Negative for congestion and sinus pressure.   Respiratory: Negative for cough, chest tightness and shortness of breath.   Cardiovascular: Negative for chest pain, palpitations and leg swelling.  Gastrointestinal: Negative for diarrhea, nausea and vomiting.       No significant abdominal pain.    Genitourinary: Negative for difficulty urinating and dysuria.  Musculoskeletal: Negative for  myalgias.       Right knee doing better s/p injection.    Skin: Negative for color change and rash.  Neurological: Negative for dizziness, light-headedness and headaches.  Psychiatric/Behavioral: Negative for agitation and dysphoric mood.       Objective:    Physical Exam  Constitutional: She appears well-developed and well-nourished. No distress.  HENT:  Nose: Nose normal.  Mouth/Throat: Oropharynx is clear and moist.  Neck: Neck supple. No thyromegaly present.  Cardiovascular: Normal rate and regular rhythm.  1/6 systolic murmur.   Pulmonary/Chest: Breath sounds normal. No respiratory distress. She has no wheezes.  Abdominal: Soft. Bowel sounds are normal. There is no tenderness.  Musculoskeletal: She exhibits no edema or tenderness.  Lymphadenopathy:    She has no cervical adenopathy.  Skin: No rash noted. No erythema.  Psychiatric: She has a normal mood and affect. Her behavior is normal.    BP 138/70 (BP Location: Left Arm, Patient Position: Sitting, Cuff Size: Normal)   Pulse (!) 52   Temp 98 F (36.7 C) (Oral)   Resp 16   Wt 166 lb 12.8 oz (75.7 kg)   SpO2 98%   BMI 29.55 kg/m  Wt Readings from Last 3 Encounters:  06/12/17 166 lb 12.8 oz (75.7 kg)  03/06/17 168 lb 9.6 oz (76.5 kg)  08/22/16 171 lb 3.2 oz (77.7 kg)     Lab Results  Component Value Date   WBC 8.5 03/06/2017   HGB 13.1 03/06/2017   HCT 40.5 03/06/2017   PLT 251.0 03/06/2017   GLUCOSE 96 03/06/2017   CHOL 166 03/06/2017   TRIG 154.0 (H) 03/06/2017   HDL 52.40 03/06/2017   LDLCALC 83 03/06/2017   ALT 20 03/06/2017   AST 20 03/06/2017   NA 141 03/06/2017   K 4.4 03/06/2017   CL 108 03/06/2017   CREATININE 0.63 03/06/2017   BUN 12 03/06/2017   CO2 29 03/06/2017   TSH 1.90 03/06/2017   INR 1.0 05/21/2012    Dg Bone Density  Result Date: 04/01/2017 EXAM: DUAL X-RAY ABSORPTIOMETRY (DXA) FOR BONE MINERAL DENSITY IMPRESSION: Dear [name]. Nicki [name], Your patient [name] [name] completed a BMD  test on 04/01/2017 using the [place] (analysis version: 14.10) manufactured by [employer]. The following summarizes the results of our evaluation. PATIENT BIOGRAPHICAL: Name: [name], [name] Patient ID: [id number] Birth Date: [birth year] Height: 63.0 in. Gender: Female Exam Date: 04/01/2017 Weight: 168.6 lbs.  Indications: Caucasian, Hysterectomy, Ovarian Cancer Fractures: Treatments: ASSESSMENT: The BMD measured at Femur Neck Left is 0.684 g/cm2 with a T-score of -2.5. This patient is considered osteoporotic according to [facility]) criteria. Lumbar spine was not utilized due to advanced degenerative changes. Site Region Measured Measured WHO Young Adult BMD Date       Age      Classification T-score DualFemur Neck Left 04/01/2017 81.0 Osteoporosis -2.5 0.684 g/cm2 Left Forearm Radius 33% 04/01/2017 81.0 Osteopenia -1.6 0.733 g/cm2 World Health Organization [facility]) criteria for post-menopausal, Caucasian Women: Normal:       T-score at or above -1 SD Osteopenia:   T-score between -1 and -2.5 SD Osteoporosis: T-score at or below -2.5 SD RECOMMENDATIONS: [place] recommends that FDA-approved medical therapies be considered in postmenopausal women and men age 55 or older with a: 1. Hip or vertebral (clinical or morphometric) fracture. 2. T-score of < -2.5 at the spine or hip. 3. Ten-year fracture probability by FRAX of 3% or greater for hip fracture or 20% or greater for major osteoporotic fracture. All treatment decisions require clinical judgment and consideration of individual patient factors, including patient preferences, co-morbidities, previous drug use, risk factors not captured in the FRAX model (e.g. falls, vitamin D deficiency, increased bone turnover, interval significant decline in bone density) and possible under - or over-estimation of fracture risk by FRAX. All patients should ensure an adequate intake of dietary calcium (1200 mg/d) and vitamin  D (800 IU daily) unless contraindicated. FOLLOW-UP: People with diagnosed cases of osteoporosis or at high risk for fracture should have regular bone mineral density tests. For patients eligible for Medicare, routine testing is allowed once every 2 years. The testing frequency can be increased to one year for patients who have rapidly progressing disease, those who are receiving or discontinuing medical therapy to restore bone mass, or have additional risk factors. I have reviewed this report, and agree with the above findings. [facility] Electronically Signed   By: Kerby Moors M.D.   On: 04/01/2017 15:36       Assessment & Plan:   Problem List Items Addressed This Visit    Abdominal pain    Minimal discomfort RLQ.  Colonoscopy 03/2016 - normal.  Had CT.  Recently evaluated by [name] Sheryn Bison.  Felt no further intervention warranted.  Currently feels she is doing well.  Follow.        Elevated blood pressure reading    On initial recheck - slightly elevated.  Recheck prior to leaving improved.  Have her spot check her pressures. Send in readings.  Follow.        History of colonic polyps    Colonoscopy 03/2016 - normal.        History of ovarian cancer    Has previously been followed by oncology at [facility].  Stable.        Hypercholesterolemia    Low cholesterol diet and exercise.  Follow lipid panel.   Lab Results  Component Value Date   CHOL 166 03/06/2017   HDL 52.40 03/06/2017   LDLCALC 83 03/06/2017   TRIG 154.0 (H) 03/06/2017   CHOLHDL 3 03/06/2017        Murmur, cardiac    1/6 systolic murmur noted on exam.  States has been told years ago - had murmur.  Currently symptomatic.  Follow.       Stress    Overall stable.  Did not report increased stress.  Einar Pheasant, MD

## 2017-06-13 ENCOUNTER — Encounter: Payer: Self-pay | Admitting: Internal Medicine

## 2017-06-13 DIAGNOSIS — R011 Cardiac murmur, unspecified: Secondary | ICD-10-CM | POA: Insufficient documentation

## 2017-06-13 NOTE — Assessment & Plan Note (Signed)
Colonoscopy 03/2016 - normal.   

## 2017-06-13 NOTE — Assessment & Plan Note (Signed)
1/6 systolic murmur noted on exam.  States has been told years ago - had murmur.  Currently symptomatic.  Follow.

## 2017-06-13 NOTE — Assessment & Plan Note (Signed)
Has previously been followed by oncology at [facility].  Stable.

## 2017-06-13 NOTE — Assessment & Plan Note (Signed)
Low cholesterol diet and exercise.  Follow lipid panel.   Lab Results  Component Value Date   CHOL 166 03/06/2017   HDL 52.40 03/06/2017   LDLCALC 83 03/06/2017   TRIG 154.0 (H) 03/06/2017   CHOLHDL 3 03/06/2017

## 2017-06-13 NOTE — Assessment & Plan Note (Signed)
Overall stable.  Did not report increased stress.

## 2017-06-13 NOTE — Assessment & Plan Note (Signed)
Minimal discomfort RLQ.  Colonoscopy 03/2016 - normal.  Had CT.  Recently evaluated by Dr Sheryn Bison.  Felt no further intervention warranted.  Currently feels she is doing well.  Follow.

## 2017-06-13 NOTE — Assessment & Plan Note (Signed)
On initial recheck - slightly elevated.  Recheck prior to leaving improved.  Have her spot check her pressures. Send in readings.  Follow.

## 2017-08-29 ENCOUNTER — Ambulatory Visit (INDEPENDENT_AMBULATORY_CARE_PROVIDER_SITE_OTHER): Payer: Medicare HMO

## 2017-08-29 VITALS — BP 132/70 | HR 60 | Temp 98.3°F | Resp 14 | Ht 63.0 in | Wt 164.1 lb

## 2017-08-29 DIAGNOSIS — Z0000 Encounter for general adult medical examination without abnormal findings: Secondary | ICD-10-CM

## 2017-08-29 NOTE — Progress Notes (Addendum)
Subjective:   [name] [name] is a 84 y.o. female who presents for Medicare Annual (Subsequent) preventive examination.  Review of Systems:  No ROS.  Medicare Wellness Visit. Additional risk factors are reflected in the social history.  Cardiac Risk Factors include: advanced age (>64men, >23 women)     Objective:     Vitals: BP 132/70 (BP Location: Left Arm, Patient Position: Sitting, Cuff Size: Normal)   Pulse 60   Temp 98.3 F (36.8 C) (Oral)   Resp 14   Ht 5\' 3"  (1.6 m)   Wt 164 lb 1.9 oz (74.4 kg)   SpO2 96%   BMI 29.07 kg/m   Body mass index is 29.07 kg/m.  Advanced Directives 08/29/2017 03/27/2016 02/08/2016  Does Patient Have a Medical Advance Directive? No No No  Does patient want to make changes to medical advance directive? - - No - Patient declined  Would patient like information on creating a medical advance directive? No - Patient declined No - patient declined information -    Tobacco Social History   Tobacco Use  Smoking Status Never Smoker  Smokeless Tobacco Never Used     Counseling given: Not Answered   Clinical Intake:  Pre-visit preparation completed: Yes        Nutritional Status: BMI 25 -29 Overweight Diabetes: No  How often do you need to have someone help you when you read instructions, pamphlets, or other written materials from your doctor or pharmacy?: 1 - Never  Interpreter Needed?: No     Past Medical History:  Diagnosis Date  . Diverticulitis of sigmoid colon   . Endometriosis   . Fibrocystic breast disease   . Glaucoma   . H/O urinary incontinence   . History of chicken pox   . History of colon polyps 2014  . Hyperlipidemia   . Hypertension   . Ovarian cancer ([place]) 2002   Chemo tx's @ [facility] with Total Hysterectomy. Dr Amalia Hailey  . Sinusitis   . Tuberculosis    positve skin test   Past Surgical History:  Procedure Laterality Date  . ABDOMINAL HYSTERECTOMY  2003   complete Dr Amalia Hailey  . ABSCESS DRAINAGE  2017   right buttock at [facility]  . APPENDECTOMY  2003  . BREAST BIOPSY Left 1993   excisional bx. negative results  . BREAST SURGERY Left 1982   papilloma removal  . COLONOSCOPY  2006, 2014   Dr Vira Agar   . COLONOSCOPY N/A 03/27/2016   Procedure: COLONOSCOPY;  Surgeon: Robert Bellow, MD;  Location: [facility] ENDOSCOPY;  Service: Endoscopy;  Laterality: N/A;  . COLOSTOMY REVERSAL  2014   at [place] Dr Raynaldo Opitz  . NASAL SINUS SURGERY     For fungal infection  . NASAL SINUS SURGERY  8/05 8/06  . OSTOMY  05/22/2012   diverticulitis with abcess/ Dr Tamala Julian  . ovarian cancer     Exploratory lap  . TONSILLECTOMY AND ADENOIDECTOMY  1947   Family History  Problem Relation Age of Onset  . Hypertension Mother   . Coronary artery disease Mother   . Heart disease Mother   . Kidney disease Mother   . Diabetes Mother   . Cancer Mother        uterine cancer  . Diabetes Father   . Kidney failure Father   . Diabetes Brother   . Coronary artery disease Brother   . Cancer Brother        bladder cancer  . Stroke Sister   .  Heart disease Sister        [facility]  . Cancer Maternal Aunt        ovarian cancer  . Breast cancer Cousin   . Cancer Brother        Prostate  . Breast cancer Cousin    Social History   Socioeconomic History  . Marital status: Widowed    Spouse name: Not on file  . Number of children: 0  . Years of education: Not on file  . Highest education level: Not on file  Occupational History  . Not on file  Social Needs  . Financial resource strain: Not hard at all  . Food insecurity:    Worry: Never true    Inability: Never true  . Transportation needs:    Medical: No    Non-medical: No  Tobacco Use  . Smoking status: Never Smoker  . Smokeless tobacco: Never Used  Substance and Sexual Activity  . Alcohol use: No    Alcohol/week: 0.0 oz  . Drug use: No  . Sexual activity: Not Currently  Lifestyle  . Physical activity:    Days per week: Not on file    Minutes per  session: Not on file  . Stress: Not on file  Relationships  . Social connections:    Talks on phone: Not on file    Gets together: Not on file    Attends religious service: Not on file    Active member of club or organization: Not on file    Attends meetings of clubs or organizations: Not on file    Relationship status: Not on file  Other Topics Concern  . Not on file  Social History Narrative  . Not on file    No outpatient encounter medications on file as of 08/29/2017.   No facility-administered encounter medications on file as of 08/29/2017.     Activities of Daily Living In your present state of health, do you have any difficulty performing the following activities: 08/29/2017  Hearing? N  Vision? N  Difficulty concentrating or making decisions? N  Walking or climbing stairs? Y  Comment R knee pain, intermittent  Dressing or bathing? N  Doing errands, shopping? N  Preparing Food and eating ? N  Using the Toilet? N  In the past six months, have you accidently leaked urine? Y  Comment Managed with daily brief  Do you have problems with loss of bowel control? N  Managing your Medications? N  Managing your Finances? N  Housekeeping or managing your Housekeeping? N  Some recent data might be hidden    Patient Care Team: Einar Pheasant, MD as PCP - General (Internal Medicine) Caryl Bis Angela Adam, MD as Consulting Physician (Family Medicine) Christene Lye, MD (General Surgery) Einar Pheasant, MD (Internal Medicine) Bary Castilla Forest Gleason, MD (General Surgery)    Assessment:   This is a routine wellness examination for [name] [name].  The goal of the wellness visit is to assist the patient how to close the gaps in care and create a preventative care plan for the patient.   The roster of all physicians providing medical care to patient is listed in the Snapshot section of the chart.  Osteoporosis risk reviewed.    Safety issues reviewed; Smoke and carbon monoxide  detectors in the home. No firearms in the home. Wears seatbelts when driving or riding with others. No violence in the home.  They do not have excessive sun exposure.  Discussed the need for sun  protection: hats, long sleeves and the use of sunscreen if there is significant sun exposure.  Patient is alert, normal appearance, oriented to person/place/and time.  Correctly identified the president of the [place] and recalls of 1/3 words. Performs simple calculations and can read correct time from watch face. Displays appropriate judgement.  No new identified risk were noted.  No failures at ADL's or IADL's.    BMI- discussed the importance of a healthy diet, water intake and the benefits of aerobic exercise. Educational material provided.   HTN; she monitors BP at home and reports wnl.  Dental- every 4 months.  Dentures.    Eye- Visual acuity not assessed per patient preference since they have regular follow up with the ophthalmologist.  Wears corrective lenses.  Sleep patterns- Sleeps through the night without issues.   Health maintenance gaps- closed.  Patient Concerns: None at this time. Follow up with PCP as needed.  Exercise Activities and Dietary recommendations Current Exercise Habits: Home exercise routine, Type of exercise: walking  Goals    . Increase physical activity     Walk for exercise as tolerated       Fall Risk Fall Risk  08/29/2017 03/06/2017 02/22/2016 02/08/2016 02/17/2015  Falls in the past year? No No No No No   Depression Screen PHQ 2/9 Scores 08/29/2017 03/06/2017 03/06/2017 02/22/2016  PHQ - 2 Score 0 0 0 0  PHQ- 9 Score - 4 - -     Cognitive Function MMSE - Mini Mental State Exam 08/29/2017 02/08/2016  Orientation to time 5 5  Orientation to Place 5 5  Registration 3 3  Attention/ Calculation 5 5  Recall 1 3  Language- name 2 objects 2 2  Language- repeat 1 1  Language- follow 3 step command 3 3  Language- read & follow direction 1 1  Write a  sentence 1 1  Copy design 1 1  Total score 28 30         There is no immunization history on file for this patient.  Screening Tests Health Maintenance  Topic Date Due  . MAMMOGRAM  01/16/2018  . DEXA SCAN  Completed  . INFLUENZA VACCINE  Discontinued  . TETANUS/TDAP  Discontinued  . PNA vac Low Risk Adult  Discontinued       Plan:   End of life planning; Advanced aging; Advanced directives discussed.  No HCPOA/Living Will.  Additional information declined at this time.  I have personally reviewed and noted the following in the patient's chart:   . Medical and social history . Use of alcohol, tobacco or illicit drugs  . Current medications and supplements . Functional ability and status . Nutritional status . Physical activity . Advanced directives . List of other physicians . Hospitalizations, surgeries, and ER visits in previous 12 months . Vitals . Screenings to include cognitive, depression, and falls . Referrals and appointments  In addition, I have reviewed and discussed with patient certain preventive protocols, quality metrics, and best practice recommendations. A written personalized care plan for preventive services as well as general preventive health recommendations were provided to patient.     Varney Biles, LPN  08/16/6576   Reviewed above information.  Agree with assessment and plan.    Dr Nicki Reaper

## 2017-08-29 NOTE — Patient Instructions (Addendum)
  [name] [name] , Thank you for taking time to come for your Medicare Wellness Visit. I appreciate your ongoing commitment to your health goals. Please review the following plan we discussed and let me know if I can assist you in the future.   Follow up with Dr. Nicki Reaper as needed.    Have a great day!  These are the goals we discussed: Goals    . Increase physical activity     Walk for exercise as tolerated       This is a list of the screening recommended for you and due dates:  Health Maintenance  Topic Date Due  . Mammogram  01/16/2018  . DEXA scan (bone density measurement)  Completed  . Flu Shot  Discontinued  . Tetanus Vaccine  Discontinued  . Pneumonia vaccines  Discontinued

## 2017-09-18 ENCOUNTER — Encounter: Payer: Self-pay | Admitting: Internal Medicine

## 2017-09-18 ENCOUNTER — Ambulatory Visit: Payer: Medicare HMO | Admitting: Internal Medicine

## 2017-09-18 VITALS — BP 134/76 | HR 57 | Temp 97.6°F | Resp 18 | Wt 160.8 lb

## 2017-09-18 DIAGNOSIS — Z8543 Personal history of malignant neoplasm of ovary: Secondary | ICD-10-CM

## 2017-09-18 DIAGNOSIS — E7800 Pure hypercholesterolemia, unspecified: Secondary | ICD-10-CM

## 2017-09-18 DIAGNOSIS — M810 Age-related osteoporosis without current pathological fracture: Secondary | ICD-10-CM

## 2017-09-18 DIAGNOSIS — R634 Abnormal weight loss: Secondary | ICD-10-CM | POA: Diagnosis not present

## 2017-09-18 LAB — URINALYSIS, ROUTINE W REFLEX MICROSCOPIC
BILIRUBIN URINE: NEGATIVE
Hgb urine dipstick: NEGATIVE
Ketones, ur: NEGATIVE
Leukocytes, UA: NEGATIVE
Nitrite: NEGATIVE
RBC / HPF: NONE SEEN (ref 0–?)
Total Protein, Urine: NEGATIVE
Urine Glucose: NEGATIVE
Urobilinogen, UA: 0.2 (ref 0.0–1.0)
pH: 5.5 (ref 5.0–8.0)

## 2017-09-18 LAB — BASIC METABOLIC PANEL
BUN: 13 mg/dL (ref 6–23)
CO2: 28 mEq/L (ref 19–32)
Calcium: 9.2 mg/dL (ref 8.4–10.5)
Chloride: 107 mEq/L (ref 96–112)
Creatinine, Ser: 0.66 mg/dL (ref 0.40–1.20)
GFR: 91.24 mL/min (ref >60.00)
Glucose, Bld: 87 mg/dL (ref 70–99)
Potassium: 4.8 mEq/L (ref 3.5–5.1)
SODIUM: 142 meq/L (ref 135–145)

## 2017-09-18 LAB — VITAMIN D 25 HYDROXY (VIT D DEFICIENCY, FRACTURES): VITD: 19.74 ng/mL — ABNORMAL LOW (ref 30.00–100.00)

## 2017-09-18 NOTE — Progress Notes (Signed)
Patient ID: [name] [name], female   DOB: 06/19/1933, 84 y.o.   MRN: [id number]   Subjective:    Patient ID: [name] [name], female    DOB: [birth year], 84 y.o.   MRN: [id number]  HPI  Patient here for a scheduled follow up.  She reports she has been doing relatively well.  Has an episode of possible viral illness (GI).  Lasted a few days.  Resolved.  Feels fine now.  Eating well.  Has lost some weight.  Trying to watch what she eats.  No chest pain.  Breathing stable.  No acid reflux.  No abdominal pain.  Bowels moving.     Past Medical History:  Diagnosis Date  . Diverticulitis of sigmoid colon   . Endometriosis   . Fibrocystic breast disease   . Glaucoma   . H/O urinary incontinence   . History of chicken pox   . History of colon polyps 2014  . Hyperlipidemia   . Hypertension   . Ovarian cancer ([place]) 2002   Chemo tx's @ [facility] with Total Hysterectomy. Dr Amalia Hailey  . Sinusitis   . Tuberculosis    positve skin test   Past Surgical History:  Procedure Laterality Date  . ABDOMINAL HYSTERECTOMY  2003   complete Dr Amalia Hailey  . ABSCESS DRAINAGE  2017   right buttock at [facility]  . APPENDECTOMY  2003  . BREAST BIOPSY Left 1993   excisional bx. negative results  . BREAST SURGERY Left 1982   papilloma removal  . COLONOSCOPY  2006, 2014   Dr Vira Agar   . COLONOSCOPY N/A 03/27/2016   Procedure: COLONOSCOPY;  Surgeon: Robert Bellow, MD;  Location: [facility] ENDOSCOPY;  Service: Endoscopy;  Laterality: N/A;  . COLOSTOMY REVERSAL  2014   at [place] Dr Raynaldo Opitz  . NASAL SINUS SURGERY     For fungal infection  . NASAL SINUS SURGERY  8/05 8/06  . OSTOMY  05/22/2012   diverticulitis with abcess/ Dr Tamala Julian  . ovarian cancer     Exploratory lap  . TONSILLECTOMY AND ADENOIDECTOMY  1947   Family History  Problem Relation Age of Onset  . Hypertension Mother   . Coronary artery disease Mother   . Heart disease Mother   . Kidney disease Mother   . Diabetes Mother   . Cancer Mother    uterine cancer  . Diabetes Father   . Kidney failure Father   . Diabetes Brother   . Coronary artery disease Brother   . Cancer Brother        bladder cancer  . Stroke Sister   . Heart disease Sister        [facility]  . Cancer Maternal Aunt        ovarian cancer  . Breast cancer Cousin   . Cancer Brother        Prostate  . Breast cancer Cousin    Social History   Socioeconomic History  . Marital status: Widowed    Spouse name: Not on file  . Number of children: 0  . Years of education: Not on file  . Highest education level: Not on file  Occupational History  . Not on file  Social Needs  . Financial resource strain: Not hard at all  . Food insecurity:    Worry: Never true    Inability: Never true  . Transportation needs:    Medical: No    Non-medical: No  Tobacco Use  . Smoking status: Never Smoker  .  Smokeless tobacco: Never Used  Substance and Sexual Activity  . Alcohol use: No    Alcohol/week: 0.0 oz  . Drug use: No  . Sexual activity: Not Currently  Lifestyle  . Physical activity:    Days per week: Not on file    Minutes per session: Not on file  . Stress: Not on file  Relationships  . Social connections:    Talks on phone: Not on file    Gets together: Not on file    Attends religious service: Not on file    Active member of club or organization: Not on file    Attends meetings of clubs or organizations: Not on file    Relationship status: Not on file  Other Topics Concern  . Not on file  Social History Narrative  . Not on file    No outpatient encounter medications on file as of 09/18/2017.   No facility-administered encounter medications on file as of 09/18/2017.     Review of Systems  Constitutional: Negative for appetite change.       Trying to watch what she eats.  Has lost some weight.    HENT: Negative for congestion and sinus pressure.   Respiratory: Negative for cough, chest tightness and shortness of breath.   Cardiovascular:  Negative for chest pain, palpitations and leg swelling.  Gastrointestinal: Negative for abdominal pain, diarrhea, nausea and vomiting.  Genitourinary: Negative for difficulty urinating and dysuria.  Musculoskeletal: Negative for joint swelling and myalgias.  Skin: Negative for color change and rash.  Neurological: Negative for dizziness, light-headedness and headaches.  Psychiatric/Behavioral: Negative for agitation and dysphoric mood.       Objective:    Physical Exam  Constitutional: She appears well-developed and well-nourished. No distress.  HENT:  Nose: Nose normal.  Mouth/Throat: Oropharynx is clear and moist.  Neck: Neck supple. No thyromegaly present.  Cardiovascular: Normal rate and regular rhythm.  Pulmonary/Chest: Breath sounds normal. No respiratory distress. She has no wheezes.  Abdominal: Soft. Bowel sounds are normal. There is no tenderness.  Musculoskeletal: She exhibits no edema or tenderness.  Lymphadenopathy:    She has no cervical adenopathy.  Skin: No rash noted. No erythema.  Psychiatric: She has a normal mood and affect. Her behavior is normal.    BP 134/76 (BP Location: Left Arm, Patient Position: Sitting, Cuff Size: Normal)   Pulse (!) 57   Temp 97.6 F (36.4 C) (Oral)   Resp 18   Wt 160 lb 12.8 oz (72.9 kg)   SpO2 98%   BMI 28.48 kg/m  Wt Readings from Last 3 Encounters:  09/18/17 160 lb 12.8 oz (72.9 kg)  08/29/17 164 lb 1.9 oz (74.4 kg)  06/12/17 166 lb 12.8 oz (75.7 kg)     Lab Results  Component Value Date   WBC 8.5 03/06/2017   HGB 13.1 03/06/2017   HCT 40.5 03/06/2017   PLT 251.0 03/06/2017   GLUCOSE 87 09/18/2017   CHOL 166 03/06/2017   TRIG 154.0 (H) 03/06/2017   HDL 52.40 03/06/2017   LDLCALC 83 03/06/2017   ALT 20 03/06/2017   AST 20 03/06/2017   NA 142 09/18/2017   K 4.8 09/18/2017   CL 107 09/18/2017   CREATININE 0.66 09/18/2017   BUN 13 09/18/2017   CO2 28 09/18/2017   TSH 1.90 03/06/2017   INR 1.0 05/21/2012     Dg Bone Density  Result Date: 04/01/2017 EXAM: DUAL X-RAY ABSORPTIOMETRY (DXA) FOR BONE MINERAL DENSITY IMPRESSION: Dear Dr. Nicki Reaper,  Your patient [name] [name] completed a BMD test on 04/01/2017 using the [employer] (analysis version: 14.10) manufactured by [employer]. The following summarizes the results of our evaluation. PATIENT BIOGRAPHICAL: Name: Azilee, Pirro Patient ID: [id number] Birth Date: 1934/05/31 Height: 63.0 in. Gender: Female Exam Date: 04/01/2017 Weight: 168.6 lbs. Indications: Caucasian, Hysterectomy, Ovarian Cancer Fractures: Treatments: ASSESSMENT: The BMD measured at Femur Neck Left is 0.684 g/cm2 with a T-score of -2.5. This patient is considered osteoporotic according to [facility]) criteria. Lumbar spine was not utilized due to advanced degenerative changes. Site Region Measured Measured WHO Young Adult BMD Date       Age      Classification T-score DualFemur Neck Left 04/01/2017 81.0 Osteoporosis -2.5 0.684 g/cm2 Left Forearm Radius 33% 04/01/2017 81.0 Osteopenia -1.6 0.733 g/cm2 World Health Organization [facility]) criteria for post-menopausal, Caucasian Women: Normal:       T-score at or above -1 SD Osteopenia:   T-score between -1 and -2.5 SD Osteoporosis: T-score at or below -2.5 SD RECOMMENDATIONS: [place] recommends that FDA-approved medical therapies be considered in postmenopausal women and men age 68 or older with a: 1. Hip or vertebral (clinical or morphometric) fracture. 2. T-score of < -2.5 at the spine or hip. 3. Ten-year fracture probability by FRAX of 3% or greater for hip fracture or 20% or greater for major osteoporotic fracture. All treatment decisions require clinical judgment and consideration of individual patient factors, including patient preferences, co-morbidities, previous drug use, risk factors not captured in the FRAX model (e.g. falls, vitamin D deficiency, increased bone turnover, interval  significant decline in bone density) and possible under - or over-estimation of fracture risk by FRAX. All patients should ensure an adequate intake of dietary calcium (1200 mg/d) and vitamin D (800 IU daily) unless contraindicated. FOLLOW-UP: People with diagnosed cases of osteoporosis or at high risk for fracture should have regular bone mineral density tests. For patients eligible for Medicare, routine testing is allowed once every 2 years. The testing frequency can be increased to one year for patients who have rapidly progressing disease, those who are receiving or discontinuing medical therapy to restore bone mass, or have additional risk factors. I have reviewed this report, and agree with the above findings. [facility] Radiology Electronically Signed   By: Kerby Moors M.D.   On: 04/01/2017 15:36       Assessment & Plan:   Problem List Items Addressed This Visit    History of ovarian cancer    Previously followed by oncology.  Doing well.        Hypercholesterolemia    Low cholesterol diet and exercise.  Follow lipid panel.   Lab Results  Component Value Date   CHOL 166 03/06/2017   HDL 52.40 03/06/2017   LDLCALC 83 03/06/2017   TRIG 154.0 (H) 03/06/2017   CHOLHDL 3 03/06/2017        Osteoporosis    Discussed her bone density results.  Discussed treatment options.  She agreed to reclast.  Form completed.        Relevant Orders   VITAMIN D 25 Hydroxy (Vit-D Deficiency, Fractures) (Completed)   Urinalysis, Routine w reflex microscopic (Completed)   Basic metabolic panel (Completed)    Other Visit Diagnoses    Weight loss    -  Primary   does watch what she eats.  eating regulalry.  follow.         Einar Pheasant, MD

## 2017-09-21 ENCOUNTER — Encounter: Payer: Self-pay | Admitting: Internal Medicine

## 2017-09-21 NOTE — Assessment & Plan Note (Signed)
Low cholesterol diet and exercise.  Follow lipid panel.   Lab Results  Component Value Date   CHOL 166 03/06/2017   HDL 52.40 03/06/2017   LDLCALC 83 03/06/2017   TRIG 154.0 (H) 03/06/2017   CHOLHDL 3 03/06/2017

## 2017-09-21 NOTE — Assessment & Plan Note (Signed)
Discussed her bone density results.  Discussed treatment options.  She agreed to reclast.  Form completed.

## 2017-09-21 NOTE — Assessment & Plan Note (Signed)
Previously followed by oncology.  Doing well.

## 2017-10-18 ENCOUNTER — Telehealth: Payer: Self-pay | Admitting: *Deleted

## 2017-10-18 NOTE — Telephone Encounter (Signed)
Copied from [place] [id number]. Topic: General - Other >> Oct 18, 2017  3:21 PM Yvette Rack wrote: Reason for CRM: Vern with [facility] called in to advise that the pt has declined reclast infusion.

## 2017-12-26 ENCOUNTER — Other Ambulatory Visit: Payer: Self-pay | Admitting: Internal Medicine

## 2017-12-26 DIAGNOSIS — Z1231 Encounter for screening mammogram for malignant neoplasm of breast: Secondary | ICD-10-CM

## 2018-01-17 ENCOUNTER — Ambulatory Visit
Admission: RE | Admit: 2018-01-17 | Discharge: 2018-01-17 | Disposition: A | Discharge status: Home / Self Care | Payer: Medicare HMO | Source: Ambulatory Visit | Attending: Internal Medicine | Admitting: Internal Medicine

## 2018-01-17 DIAGNOSIS — Z1231 Encounter for screening mammogram for malignant neoplasm of breast: Secondary | ICD-10-CM | POA: Diagnosis not present

## 2018-01-17 HISTORY — DX: Personal history of antineoplastic chemotherapy: Z92.21

## 2018-01-20 ENCOUNTER — Other Ambulatory Visit: Payer: Self-pay | Admitting: Internal Medicine

## 2018-01-20 DIAGNOSIS — R928 Other abnormal and inconclusive findings on diagnostic imaging of breast: Secondary | ICD-10-CM

## 2018-01-20 NOTE — Progress Notes (Signed)
Order placed for f/u right breast mammogram and ultrasound  

## 2018-01-21 ENCOUNTER — Ambulatory Visit: Payer: Medicare HMO | Admitting: Internal Medicine

## 2018-01-21 ENCOUNTER — Encounter: Payer: Self-pay | Admitting: Internal Medicine

## 2018-01-21 VITALS — BP 136/74 | HR 76 | Temp 97.9°F | Resp 16 | Wt 164.8 lb

## 2018-01-21 DIAGNOSIS — R600 Localized edema: Secondary | ICD-10-CM | POA: Insufficient documentation

## 2018-01-21 DIAGNOSIS — E7800 Pure hypercholesterolemia, unspecified: Secondary | ICD-10-CM | POA: Diagnosis not present

## 2018-01-21 DIAGNOSIS — E559 Vitamin D deficiency, unspecified: Secondary | ICD-10-CM | POA: Diagnosis not present

## 2018-01-21 LAB — TSH: TSH: 1.79 u[IU]/mL (ref 0.35–4.50)

## 2018-01-21 LAB — BASIC METABOLIC PANEL
BUN: 14 mg/dL (ref 6–23)
CHLORIDE: 107 meq/L (ref 96–112)
CO2: 29 meq/L (ref 19–32)
Calcium: 9.1 mg/dL (ref 8.4–10.5)
Creatinine, Ser: 0.65 mg/dL (ref 0.40–1.20)
GFR: 92.78 mL/min (ref >60.00)
Glucose, Bld: 97 mg/dL (ref 70–99)
POTASSIUM: 4.3 meq/L (ref 3.5–5.1)
SODIUM: 140 meq/L (ref 135–145)

## 2018-01-21 LAB — CBC WITH DIFFERENTIAL/PLATELET
BASOS ABS: 0 10*3/uL (ref 0.0–0.1)
Basophils Relative: 0.6 % (ref 0.0–3.0)
EOS ABS: 0 10*3/uL (ref 0.0–0.7)
Eosinophils Relative: 0.6 % (ref 0.0–5.0)
HCT: 37.4 % (ref 36.0–46.0)
Hemoglobin: 12.5 g/dL (ref 12.0–15.0)
LYMPHS ABS: 2 10*3/uL (ref 0.7–4.0)
Lymphocytes Relative: 26.9 % (ref 12.0–46.0)
MCHC: 33.5 g/dL (ref 30.0–36.0)
MCV: 93.3 fl (ref 78.0–100.0)
MONO ABS: 0.8 10*3/uL (ref 0.1–1.0)
MONOS PCT: 10.8 % (ref 3.0–12.0)
NEUTROS PCT: 61.1 % (ref 43.0–77.0)
Neutro Abs: 4.6 10*3/uL (ref 1.4–7.7)
PLATELETS: 263 10*3/uL (ref 150.0–400.0)
RBC: 4.01 Mil/uL (ref 3.87–5.11)
RDW: 14.2 % (ref 11.5–15.5)
WBC: 7.5 10*3/uL (ref 4.0–10.5)

## 2018-01-21 LAB — LIPID PANEL
Cholesterol: 160 mg/dL (ref 0–200)
HDL: 52.7 mg/dL (ref >39.00)
LDL Cholesterol: 74 mg/dL (ref 0–99)
NONHDL: 107.78
TRIGLYCERIDES: 170 mg/dL — AB (ref 0.0–149.0)
Total CHOL/HDL Ratio: 3
VLDL: 34 mg/dL (ref 0.0–40.0)

## 2018-01-21 LAB — HEPATIC FUNCTION PANEL
ALBUMIN: 3.6 g/dL (ref 3.5–5.2)
ALK PHOS: 99 U/L (ref 39–117)
ALT: 14 U/L (ref 0–35)
AST: 15 U/L (ref 0–37)
Bilirubin, Direct: 0.1 mg/dL (ref 0.0–0.3)
TOTAL PROTEIN: 6.4 g/dL (ref 6.0–8.3)
Total Bilirubin: 0.6 mg/dL (ref 0.2–1.2)

## 2018-01-21 LAB — VITAMIN D 25 HYDROXY (VIT D DEFICIENCY, FRACTURES): VITD: 28.53 ng/mL — AB (ref 30.00–100.00)

## 2018-01-21 NOTE — Progress Notes (Signed)
Patient ID: [name] [name], female   DOB: 03-20-1934, 84 y.o.   MRN: [id number]   Subjective:    Patient ID: [name] [name], female    DOB: 1934-02-04, 84 y.o.   MRN: [id number]  HPI  Patient here for a scheduled follow up.  She reports she is doing relatively well.  Tries to stay active.  No chest pain.  Breathing stable.  No acid reflux.  No abdominal pain.  Bowels moving.  Reports increased swelling - lower extremities.  Some right knee arthritis.  Swelling better in am.  Overall she feels she is doing well.     Past Medical History:  Diagnosis Date  . Diverticulitis of sigmoid colon   . Endometriosis   . Fibrocystic breast disease   . Glaucoma   . H/O urinary incontinence   . History of chicken pox   . History of colon polyps 2014  . Hyperlipidemia   . Hypertension   . Ovarian cancer ([place]) 2002   Chemo tx's @ [facility] with Total Hysterectomy. Dr Amalia Hailey  . Personal history of chemotherapy    ovarian cancer  . Sinusitis   . Tuberculosis    positve skin test   Past Surgical History:  Procedure Laterality Date  . ABDOMINAL HYSTERECTOMY  2003   complete Dr Amalia Hailey  . ABSCESS DRAINAGE  2017   right buttock at [facility]  . APPENDECTOMY  2003  . BREAST EXCISIONAL BIOPSY Left 1993   excisional bx. negative results  . BREAST SURGERY Left 1982   papilloma removal  . COLONOSCOPY  2006, 2014   Dr Vira Agar   . COLONOSCOPY N/A 03/27/2016   Procedure: COLONOSCOPY;  Surgeon: Robert Bellow, MD;  Location: [facility] ENDOSCOPY;  Service: Endoscopy;  Laterality: N/A;  . COLOSTOMY REVERSAL  2014   at [place] Dr Raynaldo Opitz  . NASAL SINUS SURGERY     For fungal infection  . NASAL SINUS SURGERY  8/05 8/06  . OOPHORECTOMY    . OSTOMY  05/22/2012   diverticulitis with abcess/ Dr Tamala Julian  . ovarian cancer     Exploratory lap  . TONSILLECTOMY AND ADENOIDECTOMY  1947   Family History  Problem Relation Age of Onset  . Hypertension Mother   . Coronary artery disease Mother   . Heart disease Mother     . Kidney disease Mother   . Diabetes Mother   . Cancer Mother        uterine cancer  . Diabetes Father   . Kidney failure Father   . Diabetes Brother   . Coronary artery disease Brother   . Cancer Brother        bladder cancer  . Stroke Sister   . Heart disease Sister        [facility]  . Cancer Maternal Aunt        ovarian cancer  . Breast cancer Cousin   . Cancer Brother        Prostate  . Breast cancer Cousin   . Breast cancer Cousin    Social History   Socioeconomic History  . Marital status: Widowed    Spouse name: Not on file  . Number of children: 0  . Years of education: Not on file  . Highest education level: Not on file  Occupational History  . Not on file  Social Needs  . Financial resource strain: Not hard at all  . Food insecurity:    Worry: Never true    Inability: Never true  .  Transportation needs:    Medical: No    Non-medical: No  Tobacco Use  . Smoking status: Never Smoker  . Smokeless tobacco: Never Used  Substance and Sexual Activity  . Alcohol use: No    Alcohol/week: 0.0 standard drinks  . Drug use: No  . Sexual activity: Not Currently  Lifestyle  . Physical activity:    Days per week: Not on file    Minutes per session: Not on file  . Stress: Not on file  Relationships  . Social connections:    Talks on phone: Not on file    Gets together: Not on file    Attends religious service: Not on file    Active member of club or organization: Not on file    Attends meetings of clubs or organizations: Not on file    Relationship status: Not on file  Other Topics Concern  . Not on file  Social History Narrative  . Not on file    No outpatient encounter medications on file as of 01/21/2018.   No facility-administered encounter medications on file as of 01/21/2018.     Review of Systems  Constitutional: Negative for appetite change and unexpected weight change.  HENT: Negative for congestion and sinus pressure.   Respiratory:  Negative for cough, chest tightness and shortness of breath.   Cardiovascular: Positive for leg swelling. Negative for chest pain and palpitations.  Gastrointestinal: Negative for abdominal pain, diarrhea, nausea and vomiting.  Genitourinary: Negative for difficulty urinating and dysuria.  Musculoskeletal: Negative for myalgias.       Right knee arthritis.    Skin: Negative for color change and rash.  Neurological: Negative for dizziness, light-headedness and headaches.  Psychiatric/Behavioral: Negative for agitation and dysphoric mood.       Objective:    Physical Exam  Constitutional: She appears well-developed and well-nourished. No distress.  HENT:  Nose: Nose normal.  Mouth/Throat: Oropharynx is clear and moist.  Neck: Neck supple. No thyromegaly present.  Cardiovascular: Normal rate and regular rhythm.  Pulmonary/Chest: Breath sounds normal. No respiratory distress. She has no wheezes.  Abdominal: Soft. Bowel sounds are normal. There is no tenderness.  Musculoskeletal: She exhibits no edema or tenderness.  Lymphadenopathy:    She has no cervical adenopathy.  Skin: No rash noted. No erythema.  Psychiatric: She has a normal mood and affect. Her behavior is normal.    BP 136/74 (BP Location: Left Arm, Patient Position: Sitting, Cuff Size: Normal)   Pulse 76   Temp 97.9 F (36.6 C) (Oral)   Resp 16   Wt 164 lb 12.8 oz (74.8 kg)   SpO2 97%   BMI 29.19 kg/m  Wt Readings from Last 3 Encounters:  01/21/18 164 lb 12.8 oz (74.8 kg)  09/18/17 160 lb 12.8 oz (72.9 kg)  08/29/17 164 lb 1.9 oz (74.4 kg)     Lab Results  Component Value Date   WBC 7.5 01/21/2018   HGB 12.5 01/21/2018   HCT 37.4 01/21/2018   PLT 263.0 01/21/2018   GLUCOSE 97 01/21/2018   CHOL 160 01/21/2018   TRIG 170.0 (H) 01/21/2018   HDL 52.70 01/21/2018   LDLCALC 74 01/21/2018   ALT 14 01/21/2018   AST 15 01/21/2018   NA 140 01/21/2018   K 4.3 01/21/2018   CL 107 01/21/2018   CREATININE 0.65  01/21/2018   BUN 14 01/21/2018   CO2 29 01/21/2018   TSH 1.79 01/21/2018   INR 1.0 05/21/2012    Mm 3d  Screen Breast Bilateral  Result Date: 01/17/2018 CLINICAL DATA:  Screening. EXAM: DIGITAL SCREENING BILATERAL MAMMOGRAM WITH TOMO AND CAD COMPARISON:  Previous exam(s). ACR Breast Density Category c: The breast tissue is heterogeneously dense, which may obscure small masses. FINDINGS: In the right breast, calcifications and a possible mass warrants further evaluation. The calcifications are seen within the upper-outer quadrant of the RIGHT breast. The possible mass is also seen within the upper-outer quadrant of the RIGHT breast, best seen on tomosynthesis CC slice 30. In the left breast, no findings suspicious for malignancy. Images were processed with CAD. IMPRESSION: Further evaluation is suggested for calcifications and a possible mass in the right breast. RECOMMENDATION: Diagnostic mammogram, including magnification views, and possibly ultrasound of the right breast. (Code:[id number]) The patient will be contacted regarding the findings, and additional imaging will be scheduled. BI-RADS CATEGORY  0: Incomplete. Need additional imaging evaluation and/or prior mammograms for comparison. Electronically Signed   By: Franki Cabot M.D.   On: 01/17/2018 14:45       Assessment & Plan:   Problem List Items Addressed This Visit    Hypercholesterolemia - Primary   Relevant Orders   Lipid panel (Completed)   Hepatic function panel (Completed)   Basic metabolic panel (Completed)   Pedal edema    Lower extremity swelling as outlined.  Better in am.  Discussed compression hose and leg elevation.  Check labs as outlined.  Follow.        Relevant Orders   TSH (Completed)   CBC with Differential/Platelet (Completed)   Vitamin D deficiency    Follow vitamin D level.        Relevant Orders   VITAMIN D 25 Hydroxy (Vit-D Deficiency, Fractures) (Completed)       Einar Pheasant, MD

## 2018-01-21 NOTE — Patient Instructions (Signed)
Take vitamin D3 1000 units per day.

## 2018-01-22 ENCOUNTER — Telehealth: Payer: Self-pay | Admitting: Internal Medicine

## 2018-01-22 ENCOUNTER — Ambulatory Visit
Admission: RE | Admit: 2018-01-22 | Discharge: 2018-01-22 | Disposition: A | Discharge status: Home / Self Care | Payer: Medicare HMO | Source: Ambulatory Visit | Attending: Internal Medicine | Admitting: Internal Medicine

## 2018-01-22 DIAGNOSIS — R928 Other abnormal and inconclusive findings on diagnostic imaging of breast: Secondary | ICD-10-CM

## 2018-01-22 DIAGNOSIS — R920 Mammographic microcalcification found on diagnostic imaging of breast: Secondary | ICD-10-CM | POA: Diagnosis not present

## 2018-01-22 DIAGNOSIS — N6311 Unspecified lump in the right breast, upper outer quadrant: Secondary | ICD-10-CM | POA: Diagnosis not present

## 2018-01-22 NOTE — Telephone Encounter (Signed)
Notified patient of lab results and patient verbalized understanding

## 2018-01-22 NOTE — Telephone Encounter (Unsigned)
Copied from [place] [id number]. Topic: Quick Communication - See Telephone Encounter >> Jan 22, 2018  9:17 AM Neva Seat wrote: Pt wanting to someone to give labs over the phone.

## 2018-01-24 ENCOUNTER — Ambulatory Visit: Payer: Medicare HMO | Admitting: Internal Medicine

## 2018-01-24 ENCOUNTER — Other Ambulatory Visit: Payer: Self-pay | Admitting: Internal Medicine

## 2018-01-24 DIAGNOSIS — R928 Other abnormal and inconclusive findings on diagnostic imaging of breast: Secondary | ICD-10-CM

## 2018-01-24 NOTE — Progress Notes (Signed)
Order placed for surgery referral for abnormal mammogram.

## 2018-01-26 ENCOUNTER — Encounter: Payer: Self-pay | Admitting: Internal Medicine

## 2018-01-26 NOTE — Assessment & Plan Note (Signed)
Lower extremity swelling as outlined.  Better in am.  Discussed compression hose and leg elevation.  Check labs as outlined.  Follow.

## 2018-01-26 NOTE — Assessment & Plan Note (Signed)
Follow vitamin D level.  

## 2018-01-28 ENCOUNTER — Telehealth: Payer: Self-pay

## 2018-01-28 NOTE — Telephone Encounter (Signed)
This msg was regarding referral. Patient was wanting to know status of referral to gen surgery. Advised pt if she has not heard anything by the end of the week to call back and let us know

## 2018-01-28 NOTE — Telephone Encounter (Signed)
Copied from [place] [id number]. Topic: General - Other >> Jan 28, 2018  9:19 AM [name] [name] wrote: Reason for CRM: Patient states she received a email from [place], she is unable to open. Would like a call back. >> Jan 28, 2018  9:28 AM [facility], Melburn Hake, CMA wrote: Routed to wrong practice.

## 2018-02-07 DIAGNOSIS — R921 Mammographic calcification found on diagnostic imaging of breast: Secondary | ICD-10-CM | POA: Diagnosis not present

## 2018-02-08 DIAGNOSIS — R921 Mammographic calcification found on diagnostic imaging of breast: Secondary | ICD-10-CM | POA: Diagnosis not present

## 2018-02-11 DIAGNOSIS — R928 Other abnormal and inconclusive findings on diagnostic imaging of breast: Secondary | ICD-10-CM | POA: Diagnosis not present

## 2018-02-14 DIAGNOSIS — R921 Mammographic calcification found on diagnostic imaging of breast: Secondary | ICD-10-CM | POA: Diagnosis not present

## 2018-02-14 DIAGNOSIS — C50411 Malignant neoplasm of upper-outer quadrant of right female breast: Secondary | ICD-10-CM | POA: Diagnosis not present

## 2018-02-14 DIAGNOSIS — Z170 Estrogen receptor positive status [ER+]: Secondary | ICD-10-CM | POA: Diagnosis not present

## 2018-03-07 DIAGNOSIS — R921 Mammographic calcification found on diagnostic imaging of breast: Secondary | ICD-10-CM | POA: Diagnosis not present

## 2018-03-07 DIAGNOSIS — R600 Localized edema: Secondary | ICD-10-CM | POA: Diagnosis not present

## 2018-03-07 DIAGNOSIS — Z803 Family history of malignant neoplasm of breast: Secondary | ICD-10-CM | POA: Diagnosis not present

## 2018-03-07 DIAGNOSIS — C561 Malignant neoplasm of right ovary: Secondary | ICD-10-CM | POA: Diagnosis not present

## 2018-03-07 DIAGNOSIS — K5792 Diverticulitis of intestine, part unspecified, without perforation or abscess without bleeding: Secondary | ICD-10-CM | POA: Diagnosis not present

## 2018-03-07 DIAGNOSIS — Z01818 Encounter for other preprocedural examination: Secondary | ICD-10-CM | POA: Diagnosis not present

## 2018-03-07 DIAGNOSIS — E669 Obesity, unspecified: Secondary | ICD-10-CM | POA: Diagnosis not present

## 2018-03-07 DIAGNOSIS — Z8543 Personal history of malignant neoplasm of ovary: Secondary | ICD-10-CM | POA: Diagnosis not present

## 2018-03-07 DIAGNOSIS — C50411 Malignant neoplasm of upper-outer quadrant of right female breast: Secondary | ICD-10-CM | POA: Diagnosis not present

## 2018-03-07 DIAGNOSIS — Z170 Estrogen receptor positive status [ER+]: Secondary | ICD-10-CM | POA: Diagnosis not present

## 2018-03-07 DIAGNOSIS — M1711 Unilateral primary osteoarthritis, right knee: Secondary | ICD-10-CM | POA: Diagnosis not present

## 2018-03-07 DIAGNOSIS — M1712 Unilateral primary osteoarthritis, left knee: Secondary | ICD-10-CM | POA: Diagnosis not present

## 2018-03-12 DIAGNOSIS — Z1331 Encounter for screening for depression: Secondary | ICD-10-CM | POA: Diagnosis not present

## 2018-03-12 DIAGNOSIS — C50911 Malignant neoplasm of unspecified site of right female breast: Secondary | ICD-10-CM | POA: Diagnosis not present

## 2018-03-12 DIAGNOSIS — Z9189 Other specified personal risk factors, not elsewhere classified: Secondary | ICD-10-CM | POA: Diagnosis not present

## 2018-03-12 DIAGNOSIS — Z7189 Other specified counseling: Secondary | ICD-10-CM | POA: Diagnosis not present

## 2018-03-12 DIAGNOSIS — C50411 Malignant neoplasm of upper-outer quadrant of right female breast: Secondary | ICD-10-CM | POA: Diagnosis not present

## 2018-03-12 DIAGNOSIS — Z01818 Encounter for other preprocedural examination: Secondary | ICD-10-CM | POA: Diagnosis not present

## 2018-03-12 DIAGNOSIS — Z170 Estrogen receptor positive status [ER+]: Secondary | ICD-10-CM | POA: Diagnosis not present

## 2018-03-12 DIAGNOSIS — R94120 Abnormal auditory function study: Secondary | ICD-10-CM | POA: Diagnosis not present

## 2018-03-12 DIAGNOSIS — Z7182 Exercise counseling: Secondary | ICD-10-CM | POA: Diagnosis not present

## 2018-03-12 DIAGNOSIS — I081 Rheumatic disorders of both mitral and tricuspid valves: Secondary | ICD-10-CM | POA: Diagnosis not present

## 2018-03-12 DIAGNOSIS — Z713 Dietary counseling and surveillance: Secondary | ICD-10-CM | POA: Diagnosis not present

## 2018-03-12 DIAGNOSIS — R54 Age-related physical debility: Secondary | ICD-10-CM | POA: Diagnosis not present

## 2018-03-12 DIAGNOSIS — I10 Essential (primary) hypertension: Secondary | ICD-10-CM | POA: Diagnosis not present

## 2018-03-17 DIAGNOSIS — C50411 Malignant neoplasm of upper-outer quadrant of right female breast: Secondary | ICD-10-CM | POA: Diagnosis not present

## 2018-03-17 DIAGNOSIS — Z170 Estrogen receptor positive status [ER+]: Secondary | ICD-10-CM | POA: Diagnosis not present

## 2018-03-17 DIAGNOSIS — C561 Malignant neoplasm of right ovary: Secondary | ICD-10-CM | POA: Diagnosis not present

## 2018-03-17 DIAGNOSIS — E7800 Pure hypercholesterolemia, unspecified: Secondary | ICD-10-CM | POA: Diagnosis not present

## 2018-03-17 DIAGNOSIS — R921 Mammographic calcification found on diagnostic imaging of breast: Secondary | ICD-10-CM | POA: Diagnosis not present

## 2018-03-18 DIAGNOSIS — C50911 Malignant neoplasm of unspecified site of right female breast: Secondary | ICD-10-CM | POA: Diagnosis not present

## 2018-03-18 DIAGNOSIS — Z170 Estrogen receptor positive status [ER+]: Secondary | ICD-10-CM | POA: Diagnosis not present

## 2018-03-18 DIAGNOSIS — E785 Hyperlipidemia, unspecified: Secondary | ICD-10-CM | POA: Diagnosis not present

## 2018-03-18 DIAGNOSIS — Z8543 Personal history of malignant neoplasm of ovary: Secondary | ICD-10-CM | POA: Diagnosis not present

## 2018-03-18 DIAGNOSIS — I10 Essential (primary) hypertension: Secondary | ICD-10-CM | POA: Diagnosis not present

## 2018-03-18 DIAGNOSIS — C50411 Malignant neoplasm of upper-outer quadrant of right female breast: Secondary | ICD-10-CM | POA: Diagnosis not present

## 2018-03-18 DIAGNOSIS — Z803 Family history of malignant neoplasm of breast: Secondary | ICD-10-CM | POA: Diagnosis not present

## 2018-03-18 DIAGNOSIS — Z79899 Other long term (current) drug therapy: Secondary | ICD-10-CM | POA: Diagnosis not present

## 2018-03-18 HISTORY — PX: BREAST LUMPECTOMY: SHX2

## 2018-03-20 ENCOUNTER — Encounter: Payer: Medicare HMO | Admitting: Internal Medicine

## 2018-03-28 DIAGNOSIS — Z1239 Encounter for other screening for malignant neoplasm of breast: Secondary | ICD-10-CM | POA: Diagnosis not present

## 2018-03-28 DIAGNOSIS — C50411 Malignant neoplasm of upper-outer quadrant of right female breast: Secondary | ICD-10-CM | POA: Diagnosis not present

## 2018-03-28 DIAGNOSIS — Z170 Estrogen receptor positive status [ER+]: Secondary | ICD-10-CM | POA: Diagnosis not present

## 2018-03-28 DIAGNOSIS — C561 Malignant neoplasm of right ovary: Secondary | ICD-10-CM | POA: Diagnosis not present

## 2018-03-28 DIAGNOSIS — Z09 Encounter for follow-up examination after completed treatment for conditions other than malignant neoplasm: Secondary | ICD-10-CM | POA: Diagnosis not present

## 2018-04-18 DIAGNOSIS — Z170 Estrogen receptor positive status [ER+]: Secondary | ICD-10-CM | POA: Diagnosis not present

## 2018-04-18 DIAGNOSIS — C50411 Malignant neoplasm of upper-outer quadrant of right female breast: Secondary | ICD-10-CM | POA: Diagnosis not present

## 2018-04-25 DIAGNOSIS — Z170 Estrogen receptor positive status [ER+]: Secondary | ICD-10-CM | POA: Diagnosis not present

## 2018-04-25 DIAGNOSIS — C50411 Malignant neoplasm of upper-outer quadrant of right female breast: Secondary | ICD-10-CM | POA: Diagnosis not present

## 2018-04-29 ENCOUNTER — Inpatient Hospital Stay: Payer: Medicare HMO | Attending: Internal Medicine | Admitting: Internal Medicine

## 2018-04-29 DIAGNOSIS — Z8543 Personal history of malignant neoplasm of ovary: Secondary | ICD-10-CM | POA: Diagnosis not present

## 2018-04-29 DIAGNOSIS — Z170 Estrogen receptor positive status [ER+]: Secondary | ICD-10-CM | POA: Insufficient documentation

## 2018-04-29 DIAGNOSIS — C50411 Malignant neoplasm of upper-outer quadrant of right female breast: Secondary | ICD-10-CM | POA: Insufficient documentation

## 2018-04-29 NOTE — Assessment & Plan Note (Addendum)
#   Stage I ER/PR-pos; Her 2 Neu amplified breast cancer.  Patient currently status post lumpectomy/sentinel for biopsy.  #Again reviewed with the patient and friend that adjuvant therapy would be recommended with the goal of cure.  The patient should be cured about 90% of the times.  Discussed the treatment option-weekly Taxol x12/Herceptin; followed by Herceptin maintenance for 1 year.  Also discussed regarding postlumpectomy radiation.  Also discussed regarding adjuvant aromatase inhibitor for total of 5 years.  Discussed the potential side effects including but not limited to-increasing fatigue, nausea vomiting, diarrhea, hair loss, sores in the mouth, increase risk of infection and also neuropathy.  Reviewed the cardiotoxicity from Herceptin which is usually reversible.  #  Hx of ovarian cancer 2002; s/p carboTaxol x6 [[place]].  Cured.  # Genetic counseling-patient will be candidate for genetic testing; [facility].  #Reviewed the recommendations from Dr. Derrill Center at [facility]; which I also endorse/as above.  However given patient's previous " bad experience" with previous chemotherapy-patient will reluctant with treatment options.  Had a long discussion regarding multiple options of modifying the doses/schedule-if patient noted to have declining quality of life.  Patient wants to get her treatments closer to home-in [place]/lives in [place].  However she still undecided. Also reminded the patient that I would not recommend waiting too long to make a decision especially in case of adjuvant therapies-but the goal is cure..  I have encouraged the patient to talk to Dr. Derrill Center she is appointment on December 6.    # DISPOSITION: # pt will call for appt/[place]- Dr.B

## 2018-04-29 NOTE — Progress Notes (Signed)
[facility] CONSULT NOTE  Patient Care Team: Einar Pheasant, MD as PCP - General (Internal Medicine) Caryl Bis Angela Adam, MD as Consulting Physician (Family Medicine) Christene Lye, MD (General Surgery) Einar Pheasant, MD (Internal Medicine) Bary Castilla Forest Gleason, MD (General Surgery)  CHIEF COMPLAINTS/PURPOSE OF CONSULTATION:    #  Oncology History   # OCT 2019- RUOQ IMC-stage I [lumpectomy sentinel lymph node biopsy [facility]; T=0.6 cm; N=0] ER/PR-POSITIVE;her 2 neu-AMPLIFIED [2.28]  # Ovarian cancer 2002; s/p carboTaxol x6 [[place]]  # Genetic testing? [[facility]]     Carcinoma of upper-outer quadrant of right breast in female, estrogen receptor positive ([place])   04/29/2018 Initial Diagnosis    Carcinoma of upper-outer quadrant of right breast in female, estrogen receptor positive ([place])     Ovarian cancer treated in 2003, she underwent exploratory laparotomy, TAH/BSO, omentectomy, peritoneal biopsies, selective pelvic and periaortic lymph node sampling in [place] for a Grade 1 papillary serous carcinoma of the ovary on June 20, 2001. Last tine she saw GYN oncology was 2011 (Dr. Erasmo Leventhal)   HISTORY OF PRESENTING ILLNESS:  [name] [name] 84 y.o.  female    Review of Systems  Constitutional: Negative for chills, diaphoresis, fever, malaise/fatigue and weight loss.  HENT: Negative for nosebleeds and sore throat.   Eyes: Negative for double vision.  Respiratory: Negative for cough, hemoptysis, sputum production, shortness of breath and wheezing.   Cardiovascular: Negative for chest pain, palpitations, orthopnea and leg swelling.  Gastrointestinal: Negative for abdominal pain, blood in stool, constipation, diarrhea, heartburn, melena, nausea and vomiting.  Genitourinary: Negative for dysuria, frequency and urgency.  Musculoskeletal: Negative for back pain and joint pain.  Skin: Negative.  Negative for itching and rash.  Neurological: Negative for  dizziness, tingling, focal weakness, weakness and headaches.  Endo/Heme/Allergies: Does not bruise/bleed easily.  Psychiatric/Behavioral: Negative for depression. The patient is not nervous/anxious and does not have insomnia.      MEDICAL HISTORY:  Past Medical History:  Diagnosis Date  . Diverticulitis of sigmoid colon   . Endometriosis   . Fibrocystic breast disease   . Glaucoma   . H/O urinary incontinence   . History of chicken pox   . History of colon polyps 2014  . Hyperlipidemia   . Hypertension   . Ovarian cancer ([place]) 2002   Chemo tx's @ [facility] with Total Hysterectomy. Dr Amalia Hailey  . Personal history of chemotherapy    ovarian cancer  . Sinusitis   . Tuberculosis    positve skin test    SURGICAL HISTORY: Past Surgical History:  Procedure Laterality Date  . ABDOMINAL HYSTERECTOMY  2003   complete Dr Amalia Hailey  . ABSCESS DRAINAGE  2017   right buttock at [facility]  . APPENDECTOMY  2003  . BREAST EXCISIONAL BIOPSY Left 1993   excisional bx. negative results  . BREAST SURGERY Left 1982   papilloma removal  . COLONOSCOPY  2006, 2014   Dr Vira Agar   . COLONOSCOPY N/A 03/27/2016   Procedure: COLONOSCOPY;  Surgeon: Robert Bellow, MD;  Location: [facility] ENDOSCOPY;  Service: Endoscopy;  Laterality: N/A;  . COLOSTOMY REVERSAL  2014   at [place] Dr Raynaldo Opitz  . NASAL SINUS SURGERY     For fungal infection  . NASAL SINUS SURGERY  8/05 8/06  . OOPHORECTOMY    . OSTOMY  05/22/2012   diverticulitis with abcess/ Dr Tamala Julian  . ovarian cancer     Exploratory lap  . [place]  HISTORY: Social History   Socioeconomic History  . Marital status: Widowed    Spouse name: Not on file  . Number of children: 0  . Years of education: Not on file  . Highest education level: Not on file  Occupational History  . Not on file  Social Needs  . Financial resource strain: Not hard at all  . Food insecurity:    Worry: Never true    Inability: Never true   . Transportation needs:    Medical: No    Non-medical: No  Tobacco Use  . Smoking status: Never Smoker  . Smokeless tobacco: Never Used  Substance and Sexual Activity  . Alcohol use: No    Alcohol/week: 0.0 standard drinks  . Drug use: No  . Sexual activity: Not Currently  Lifestyle  . Physical activity:    Days per week: Not on file    Minutes per session: Not on file  . Stress: Not on file  Relationships  . Social connections:    Talks on phone: Not on file    Gets together: Not on file    Attends religious service: Not on file    Active member of club or organization: Not on file    Attends meetings of clubs or organizations: Not on file    Relationship status: Not on file  . Intimate partner violence:    Fear of current or ex partner: Not on file    Emotionally abused: Not on file    Physically abused: Not on file    Forced sexual activity: Not on file  Other Topics Concern  . Not on file  Social History Narrative  . Not on file    FAMILY HISTORY: Family History  Problem Relation Age of Onset  . Hypertension Mother   . Coronary artery disease Mother   . Heart disease Mother   . Kidney disease Mother   . Diabetes Mother   . Cancer Mother        uterine cancer  . Diabetes Father   . Kidney failure Father   . Diabetes Brother   . Coronary artery disease Brother   . Cancer Brother        bladder cancer  . Stroke Sister   . Heart disease Sister        [facility]  . Cancer Maternal Aunt        ovarian cancer  . Breast cancer Cousin   . Cancer Brother        Prostate  . Breast cancer Cousin   . Breast cancer Cousin     ALLERGIES:  has No Known Allergies.  MEDICATIONS:  Current Outpatient Medications  Medication Sig Dispense Refill  . Calcium Carbonate-Vitamin D 600-400 MG-UNIT tablet Take by mouth.     No current facility-administered medications for this visit.       Marland Kitchen  PHYSICAL EXAMINATION: ECOG PERFORMANCE STATUS: 0 -  Asymptomatic  Vitals:   04/29/18 1431  BP: (!) 168/70  Pulse: 60  Resp: 16  Temp: 97.9 F (36.6 C)   Filed Weights   04/29/18 1431  Weight: 167 lb (75.8 kg)    Physical Exam  Constitutional: She is oriented to person, place, and time and well-developed, well-nourished, and in no distress.  HENT:  Head: Normocephalic and atraumatic.  Mouth/Throat: Oropharynx is clear and moist. No oropharyngeal exudate.  Eyes: Pupils are equal, round, and reactive to light.  Neck: Normal range of motion. Neck supple.  Cardiovascular: Normal rate  and regular rhythm.  Pulmonary/Chest: No respiratory distress. She has no wheezes.  Abdominal: Soft. Bowel sounds are normal. She exhibits no distension and no mass. There is no tenderness. There is no rebound and no guarding.  Musculoskeletal: Normal range of motion. She exhibits no edema or tenderness.  Neurological: She is alert and oriented to person, place, and time.  Skin: Skin is warm.  Psychiatric: Affect normal.     LABORATORY DATA:  I have reviewed the data as listed Lab Results  Component Value Date   WBC 7.5 01/21/2018   HGB 12.5 01/21/2018   HCT 37.4 01/21/2018   MCV 93.3 01/21/2018   PLT 263.0 01/21/2018   Recent Labs    09/18/17 0937 01/21/18 0840  NA 142 140  K 4.8 4.3  CL 107 107  CO2 28 29  GLUCOSE 87 97  BUN 13 14  CREATININE 0.66 0.65  CALCIUM 9.2 9.1  PROT  --  6.4  ALBUMIN  --  3.6  AST  --  15  ALT  --  14  ALKPHOS  --  99  BILITOT  --  0.6  BILIDIR  --  0.1    RADIOGRAPHIC STUDIES: I have personally reviewed the radiological images as listed and agreed with the findings in the report. No results found.  ASSESSMENT & PLAN:   Carcinoma of upper-outer quadrant of right breast in female, estrogen receptor positive ([place]) # Stage I ER/PR-pos; Her 2 Neu amplified breast cancer.  Patient currently status post lumpectomy/sentinel for biopsy.  #Again reviewed with the patient and friend that adjuvant therapy  would be recommended with the goal of cure.  The patient should be cured about 90% of the times.  Discussed the treatment option-weekly Taxol x12/Herceptin; followed by Herceptin maintenance for 1 year.  Also discussed regarding postlumpectomy radiation.  Also discussed regarding adjuvant aromatase inhibitor for total of 5 years.  Discussed the potential side effects including but not limited to-increasing fatigue, nausea vomiting, diarrhea, hair loss, sores in the mouth, increase risk of infection and also neuropathy.  Reviewed the cardiotoxicity from Herceptin which is usually reversible.  #  Hx of ovarian cancer 2002; s/p carboTaxol x6 [[place]].  Cured.  # Genetic counseling-patient will be candidate for genetic testing; [facility].  #Reviewed the recommendations from Dr. Derrill Center at [facility]; which I also endorse/as above.  However given patient's previous " bad experience" with previous chemotherapy-patient will reluctant with treatment options.  Had a long discussion regarding multiple options of modifying the doses/schedule-if patient noted to have declining quality of life.  Patient wants to get her treatments closer to home-in [place]/lives in [place].  However she still undecided. Also reminded the patient that I would not recommend waiting too long to make a decision especially in case of adjuvant therapies-but the goal is cure..  I have encouraged the patient to talk to Dr. Derrill Center she is appointment on December 6.    # DISPOSITION: # pt will call for appt/[place]- Dr.B  All questions were answered. The patient knows to call the clinic with any problems, questions or concerns.    Cammie Sickle, MD 04/30/2018 7:31 AM

## 2018-05-02 ENCOUNTER — Ambulatory Visit: Payer: Medicare HMO | Admitting: Internal Medicine

## 2018-05-02 DIAGNOSIS — E7800 Pure hypercholesterolemia, unspecified: Secondary | ICD-10-CM

## 2018-05-02 DIAGNOSIS — F439 Reaction to severe stress, unspecified: Secondary | ICD-10-CM

## 2018-05-02 DIAGNOSIS — Z170 Estrogen receptor positive status [ER+]: Secondary | ICD-10-CM

## 2018-05-02 DIAGNOSIS — C50411 Malignant neoplasm of upper-outer quadrant of right female breast: Secondary | ICD-10-CM

## 2018-05-02 DIAGNOSIS — R69 Illness, unspecified: Secondary | ICD-10-CM | POA: Diagnosis not present

## 2018-05-02 DIAGNOSIS — E559 Vitamin D deficiency, unspecified: Secondary | ICD-10-CM | POA: Diagnosis not present

## 2018-05-02 NOTE — Progress Notes (Signed)
Patient ID: [name] [name], female   DOB: [birth year], 84 y.o.   MRN: [id number]   Subjective:    Patient ID: [name] [name], female    DOB: 1933-10-17, 84 y.o.   MRN: [id number]  HPI  Patient here for a scheduled follow up.  Has a history of ovarian cancer - treated 2003.  Recently diagnosed with right breast cancer.  S/p lumpectomy/sentinel for biopsy.  Just saw Dr Rogue Bussing 04/29/18.  Discussed further treatment options.  Seeing Dr Derrill Center. Notes reviewed.  Has f/u with Dr Derrill Center 05/16/18.  Overall doing well.  No chest pain.  Breathing stable.  Had recent ECHO - mild LVH.  No acid reflux.  No abdominal pain.  Bowels moving.  Some knee discomfort.  Notices with walking up and down stairs/inclines.  Desires no further intervention.  Follow.     Past Medical History:  Diagnosis Date  . Diverticulitis of sigmoid colon   . Endometriosis   . Fibrocystic breast disease   . Glaucoma   . H/O urinary incontinence   . History of chicken pox   . History of colon polyps 2014  . Hyperlipidemia   . Hypertension   . Ovarian cancer ([place]) 2002   Chemo tx's @ [facility] with Total Hysterectomy. Dr Amalia Hailey  . Personal history of chemotherapy    ovarian cancer  . Sinusitis   . Tuberculosis    positve skin test   Past Surgical History:  Procedure Laterality Date  . ABDOMINAL HYSTERECTOMY  2003   complete Dr Amalia Hailey  . ABSCESS DRAINAGE  2017   right buttock at [facility]  . APPENDECTOMY  2003  . BREAST EXCISIONAL BIOPSY Left 1993   excisional bx. negative results  . BREAST SURGERY Left 1982   papilloma removal  . COLONOSCOPY  2006, 2014   Dr Vira Agar   . COLONOSCOPY N/A 03/27/2016   Procedure: COLONOSCOPY;  Surgeon: Robert Bellow, MD;  Location: [facility] ENDOSCOPY;  Service: Endoscopy;  Laterality: N/A;  . COLOSTOMY REVERSAL  2014   at [place] Dr Raynaldo Opitz  . NASAL SINUS SURGERY     For fungal infection  . NASAL SINUS SURGERY  8/05 8/06  . OOPHORECTOMY    . OSTOMY  05/22/2012   diverticulitis with  abcess/ Dr Tamala Julian  . ovarian cancer     Exploratory lap  . TONSILLECTOMY AND ADENOIDECTOMY  1947   Family History  Problem Relation Age of Onset  . Hypertension Mother   . Coronary artery disease Mother   . Heart disease Mother   . Kidney disease Mother   . Diabetes Mother   . Cancer Mother        uterine cancer  . Diabetes Father   . Kidney failure Father   . Diabetes Brother   . Coronary artery disease Brother   . Cancer Brother        bladder cancer  . Stroke Sister   . Heart disease Sister        [facility]  . Cancer Maternal Aunt        ovarian cancer  . Breast cancer Cousin   . Cancer Brother        Prostate  . Breast cancer Cousin   . Breast cancer Cousin    Social History   Socioeconomic History  . Marital status: Widowed    Spouse name: Not on file  . Number of children: 0  . Years of education: Not on file  . Highest education level: Not on  file  Occupational History  . Not on file  Social Needs  . Financial resource strain: Not hard at all  . Food insecurity:    Worry: Never true    Inability: Never true  . Transportation needs:    Medical: No    Non-medical: No  Tobacco Use  . Smoking status: Never Smoker  . Smokeless tobacco: Never Used  Substance and Sexual Activity  . Alcohol use: No    Alcohol/week: 0.0 standard drinks  . Drug use: No  . Sexual activity: Not Currently  Lifestyle  . Physical activity:    Days per week: Not on file    Minutes per session: Not on file  . Stress: Not on file  Relationships  . Social connections:    Talks on phone: Not on file    Gets together: Not on file    Attends religious service: Not on file    Active member of club or organization: Not on file    Attends meetings of clubs or organizations: Not on file    Relationship status: Not on file  Other Topics Concern  . Not on file  Social History Narrative  . Not on file    Outpatient Encounter Medications as of 05/02/2018  Medication Sig  .  Calcium Carbonate-Vitamin D 600-400 MG-UNIT tablet Take by mouth.   No facility-administered encounter medications on file as of 05/02/2018.     Review of Systems  Constitutional: Negative for appetite change and unexpected weight change.  HENT: Negative for congestion and sinus pressure.   Respiratory: Negative for cough, chest tightness and shortness of breath.   Cardiovascular: Negative for chest pain, palpitations and leg swelling.  Gastrointestinal: Negative for abdominal pain, diarrhea, nausea and vomiting.  Genitourinary: Negative for difficulty urinating and dysuria.  Musculoskeletal: Negative for myalgias.       Some knee discomfort as outlined.    Skin: Negative for color change and rash.  Neurological: Negative for dizziness, light-headedness and headaches.  Psychiatric/Behavioral: Negative for agitation and dysphoric mood.       Objective:    Physical Exam  Constitutional: She appears well-developed and well-nourished. No distress.  HENT:  Nose: Nose normal.  Mouth/Throat: Oropharynx is clear and moist.  Neck: Neck supple. No thyromegaly present.  Cardiovascular: Normal rate and regular rhythm.  Pulmonary/Chest: Breath sounds normal. No respiratory distress. She has no wheezes.  Abdominal: Soft. Bowel sounds are normal. There is no tenderness.  Musculoskeletal: She exhibits no edema or tenderness.  Lymphadenopathy:    She has no cervical adenopathy.  Skin: No rash noted. No erythema.  Psychiatric: She has a normal mood and affect. Her behavior is normal.    BP 134/62 (BP Location: Left Arm, Patient Position: Sitting, Cuff Size: Normal)   Pulse 63   Temp 97.9 F (36.6 C) (Oral)   Resp 18   Wt 169 lb 9.6 oz (76.9 kg)   SpO2 98%   BMI 30.04 kg/m  Wt Readings from Last 3 Encounters:  05/02/18 169 lb 9.6 oz (76.9 kg)  04/29/18 167 lb (75.8 kg)  01/21/18 164 lb 12.8 oz (74.8 kg)     Lab Results  Component Value Date   WBC 7.5 01/21/2018   HGB 12.5  01/21/2018   HCT 37.4 01/21/2018   PLT 263.0 01/21/2018   GLUCOSE 97 01/21/2018   CHOL 160 01/21/2018   TRIG 170.0 (H) 01/21/2018   HDL 52.70 01/21/2018   LDLCALC 74 01/21/2018   ALT 14 01/21/2018  AST 15 01/21/2018   NA 140 01/21/2018   K 4.3 01/21/2018   CL 107 01/21/2018   CREATININE 0.65 01/21/2018   BUN 14 01/21/2018   CO2 29 01/21/2018   TSH 1.79 01/21/2018   INR 1.0 05/21/2012    US Breast Ltd Uni Right Inc Axilla  Result Date: 01/22/2018 CLINICAL DATA:  Patient presents for additional views of the right breast as follow-up to recent screening exam to evaluate a possible mass as well as microcalcifications. EXAM: DIGITAL DIAGNOSTIC right MAMMOGRAM WITH TOMO ULTRASOUND right BREAST COMPARISON:  Previous exam(s). ACR Breast Density Category c: The breast tissue is heterogeneously dense, which may obscure small masses. FINDINGS: Spot magnification images demonstrate a group of coarse heterogeneous microcalcifications over the upper outer quadrant of the right breast spanning 4 x 9 x 11 mm. Spot compression tomographic images demonstrate a possible small circumscribed mass over the slightly outer breast on the CC image not well seen on the lateral view nor screening MLO view. Targeted ultrasound is performed, showing an oval circumscribed nearly anechoic mass deep at the 11 o'clock position of the right breast 5 cm from the nipple likely corresponding to the mammographic abnormality. This measures 4 x 7 x 7 mm and likely represents a minimally complicated cyst. Remainder of the outer half of the right breast is unremarkable. IMPRESSION: 1. Indeterminate group of microcalcifications over the right upper outer quadrant spanning 4 x 9 x 11 mm. 2. Probable complicated cyst at the 11 o'clock position of the right breast 5 cm from the nipple measuring 4 x 7 x 7 mm. RECOMMENDATION: Recommend stereotactic core needle biopsy of the indeterminate group of microcalcifications over the right upper  outer quadrant. Also recommend six-month follow-up diagnostic right breast ultrasound to document stability of the probable complicated cyst. I have discussed the findings and recommendations with the patient. Results were also provided in writing at the conclusion of the visit. If applicable, a reminder letter will be sent to the patient regarding the next appointment. BI-RADS CATEGORY  4: Suspicious. Biopsy will be scheduled [facility] at the [facility] prior to patient's departure. Electronically Signed   By: Marin Olp M.D.   On: 01/22/2018 14:12   Mm Diag Breast Tomo Uni Right  Result Date: 01/22/2018 CLINICAL DATA:  Patient presents for additional views of the right breast as follow-up to recent screening exam to evaluate a possible mass as well as microcalcifications. EXAM: DIGITAL DIAGNOSTIC right MAMMOGRAM WITH TOMO ULTRASOUND right BREAST COMPARISON:  Previous exam(s). ACR Breast Density Category c: The breast tissue is heterogeneously dense, which may obscure small masses. FINDINGS: Spot magnification images demonstrate a group of coarse heterogeneous microcalcifications over the upper outer quadrant of the right breast spanning 4 x 9 x 11 mm. Spot compression tomographic images demonstrate a possible small circumscribed mass over the slightly outer breast on the CC image not well seen on the lateral view nor screening MLO view. Targeted ultrasound is performed, showing an oval circumscribed nearly anechoic mass deep at the 11 o'clock position of the right breast 5 cm from the nipple likely corresponding to the mammographic abnormality. This measures 4 x 7 x 7 mm and likely represents a minimally complicated cyst. Remainder of the outer half of the right breast is unremarkable. IMPRESSION: 1. Indeterminate group of microcalcifications over the right upper outer quadrant spanning 4 x 9 x 11 mm. 2. Probable complicated cyst at the 11 o'clock position of the right breast 5 cm from the nipple  measuring 4 x 7 x 7 mm. RECOMMENDATION: Recommend stereotactic core needle biopsy of the indeterminate group of microcalcifications over the right upper outer quadrant. Also recommend six-month follow-up diagnostic right breast ultrasound to document stability of the probable complicated cyst. I have discussed the findings and recommendations with the patient. Results were also provided in writing at the conclusion of the visit. If applicable, a reminder letter will be sent to the patient regarding the next appointment. BI-RADS CATEGORY  4: Suspicious. Biopsy will be scheduled here at the [facility] prior to patient's departure. Electronically Signed   By: Marin Olp M.D.   On: 01/22/2018 14:12       Assessment & Plan:   Problem List Items Addressed This Visit    Carcinoma of upper-outer quadrant of right breast in female, estrogen receptor positive ([place])    Recent diagnosis.  S/p lumpectomy/sentinel for biopsy.  Met recently with Dr Rogue Bussing.  Discussed treatment options.  Has f/u with Dr Derrill Center.  Discussed with her today.        Hypercholesterolemia    Low cholesterol diet and exercise.  Follow lipid panel.        Stress    Increased stress with recent diagnosis and decisions about treatment.  Overall handling things well.  Follow.  Does not feel needs any further intervention.        Vitamin D deficiency    Follow vitamin D level.            Einar Pheasant, MD

## 2018-05-11 ENCOUNTER — Encounter: Payer: Self-pay | Admitting: Internal Medicine

## 2018-05-11 NOTE — Assessment & Plan Note (Signed)
Low cholesterol diet and exercise.  Follow lipid panel.   

## 2018-05-11 NOTE — Assessment & Plan Note (Signed)
Increased stress with recent diagnosis and decisions about treatment.  Overall handling things well.  Follow.  Does not feel needs any further intervention.

## 2018-05-11 NOTE — Assessment & Plan Note (Signed)
Recent diagnosis.  S/p lumpectomy/sentinel for biopsy.  Met recently with Dr Rogue Bussing.  Discussed treatment options.  Has f/u with Dr Derrill Center.  Discussed with her today.

## 2018-05-11 NOTE — Assessment & Plan Note (Signed)
Follow vitamin D level.  

## 2018-05-12 ENCOUNTER — Ambulatory Visit: Payer: Medicare HMO | Admitting: Radiation Oncology

## 2018-05-14 ENCOUNTER — Telehealth: Payer: Self-pay | Admitting: Internal Medicine

## 2018-05-14 ENCOUNTER — Ambulatory Visit
Admission: RE | Admit: 2018-05-14 | Discharge: 2018-05-14 | Disposition: A | Discharge status: Home / Self Care | Payer: Medicare HMO | Source: Ambulatory Visit | Attending: Radiation Oncology | Admitting: Radiation Oncology

## 2018-05-14 ENCOUNTER — Other Ambulatory Visit: Payer: Self-pay

## 2018-05-14 ENCOUNTER — Other Ambulatory Visit: Payer: Self-pay | Admitting: Internal Medicine

## 2018-05-14 ENCOUNTER — Encounter: Payer: Self-pay | Admitting: Radiation Oncology

## 2018-05-14 VITALS — BP 142/72 | HR 72 | Temp 97.8°F | Resp 16 | Wt 168.1 lb

## 2018-05-14 DIAGNOSIS — Z803 Family history of malignant neoplasm of breast: Secondary | ICD-10-CM | POA: Insufficient documentation

## 2018-05-14 DIAGNOSIS — Z170 Estrogen receptor positive status [ER+]: Secondary | ICD-10-CM | POA: Insufficient documentation

## 2018-05-14 DIAGNOSIS — E785 Hyperlipidemia, unspecified: Secondary | ICD-10-CM | POA: Diagnosis not present

## 2018-05-14 DIAGNOSIS — Z809 Family history of malignant neoplasm, unspecified: Secondary | ICD-10-CM | POA: Diagnosis not present

## 2018-05-14 DIAGNOSIS — M7989 Other specified soft tissue disorders: Secondary | ICD-10-CM | POA: Diagnosis not present

## 2018-05-14 DIAGNOSIS — Z8719 Personal history of other diseases of the digestive system: Secondary | ICD-10-CM | POA: Diagnosis not present

## 2018-05-14 DIAGNOSIS — Z8543 Personal history of malignant neoplasm of ovary: Secondary | ICD-10-CM | POA: Insufficient documentation

## 2018-05-14 DIAGNOSIS — Z9221 Personal history of antineoplastic chemotherapy: Secondary | ICD-10-CM | POA: Insufficient documentation

## 2018-05-14 DIAGNOSIS — I10 Essential (primary) hypertension: Secondary | ICD-10-CM | POA: Insufficient documentation

## 2018-05-14 DIAGNOSIS — C50411 Malignant neoplasm of upper-outer quadrant of right female breast: Secondary | ICD-10-CM | POA: Diagnosis not present

## 2018-05-14 DIAGNOSIS — Z86010 Personal history of colonic polyps: Secondary | ICD-10-CM | POA: Insufficient documentation

## 2018-05-14 NOTE — Telephone Encounter (Signed)
Please schedule pt for Taxol-herceptin on 12/10 in [place]/cmp/MD

## 2018-05-14 NOTE — Telephone Encounter (Signed)
New chemo start- will need chemo class.

## 2018-05-14 NOTE — Progress Notes (Signed)
START ON PATHWAY REGIMEN - Breast   Paclitaxel Weekly + Trastuzumab Weekly:   Administer weekly:     Paclitaxel      Trastuzumab-xxxx      Trastuzumab-xxxx   **Always confirm dose/schedule in your pharmacy ordering system**  Trastuzumab (Maintenance - NO Loading Dose):   A cycle is every 21 days:     Trastuzumab-xxxx   **Always confirm dose/schedule in your pharmacy ordering system**  Patient Characteristics: Postoperative without Neoadjuvant Therapy (Pathologic Staging), Invasive Disease, Adjuvant Therapy, HER2 Positive, ER Positive, Node Negative, pT1c, pN0/N21m Therapeutic Status: Postoperative without Neoadjuvant Therapy (Pathologic Staging) AJCC Grade: G3 AJCC N Category: pN0 AJCC M Category: cM0 ER Status: Positive (+) AJCC 8 Stage Grouping: IA HER2 Status: Positive (+) Oncotype Dx Recurrence Score: Not Appropriate AJCC T Category: pT1 PR Status: Positive (+) Intent of Therapy: Curative Intent, Discussed with Patient

## 2018-05-14 NOTE — Consult Note (Signed)
NEW PATIENT EVALUATION  Name: [name] [name]  MRN: [id number]  Date:   05/14/2018     DOB: Dec 24, 1933   This 84 y.o. female patient presents to the clinic for initial evaluation of stage I (T1 N0 M0) ER/PR positive HER-2/neu overexpressed invasive mammary carcinoma the right upper outer quadrant status post wide local excision and sentinel node biopsy.  REFERRING PHYSICIAN: Force, Shari Prows, DO  CHIEF COMPLAINT:  Chief Complaint  Patient presents with  . Breast Cancer    Pt is here for initial consultation     DIAGNOSIS: The encounter diagnosis was Carcinoma of upper-outer quadrant of right breast in female, estrogen receptor positive ([place]).   PREVIOUS INVESTIGATIONS:  Mammogram and ultrasound and MRI scan reviewed Pathology reviewed Clinical notes reviewed  HPI: patient is an 84 year old female who presented with an abnormal mammogram of her right breast showing indeterminate group of microcalcifications in the right upper outer quadrant spanning 1.1 cm. Should 1 stereotactic biopsy positive for invasive ductal carcinoma Nottingham grade 3. Tumor was ER/PR positive and HER-2/neu overexpressed. Patient would not have a wide local excision and sentinel node biopsy showing 3 sentinel and 1 axillary lymph node negative for metastatic disease. Tumor measuring approximate 6 mm and was associated with high-grade ductal carcinoma in situ. Margins were clear on further excision at time of ide local excision.postoperatively she is seeing medical oncology with recommendation for Taxol as well as Herceptin. She is seen Dr. Ezzie Dural medical oncology and plans to have that performed here in [facility]. Patient has a history of ovarian cancer she does have bilateral lower extremity swellingand has a normal ejection fraction. She is now referred to radiation oncology for consideration of treatment. She is otherwise doing well specifically denies breast tenderness cough or bone pain.  PLANNED TREATMENT  REGIMEN:a djuvant chemotherapy and Herceptin plus whole breast radiation  PAST MEDICAL HISTORY:  has a past medical history of Diverticulitis of sigmoid colon, Endometriosis, Fibrocystic breast disease, Glaucoma, H/O urinary incontinence, History of chicken pox, History of colon polyps (2014), Hyperlipidemia, Hypertension, Ovarian cancer ([place]) (2002), Personal history of chemotherapy, Sinusitis, and Tuberculosis.    PAST SURGICAL HISTORY:  Past Surgical History:  Procedure Laterality Date  . ABDOMINAL HYSTERECTOMY  2003   complete Dr Amalia Hailey  . ABSCESS DRAINAGE  2017   right buttock at [facility]  . APPENDECTOMY  2003  . BREAST EXCISIONAL BIOPSY Left 1993   excisional bx. negative results  . BREAST SURGERY Left 1982   papilloma removal  . COLONOSCOPY  2006, 2014   Dr Vira Agar   . COLONOSCOPY N/A 03/27/2016   Procedure: COLONOSCOPY;  Surgeon: Robert Bellow, MD;  Location: [facility] ENDOSCOPY;  Service: Endoscopy;  Laterality: N/A;  . COLOSTOMY REVERSAL  2014   at [place] Dr Raynaldo Opitz  . NASAL SINUS SURGERY     For fungal infection  . NASAL SINUS SURGERY  8/05 8/06  . OOPHORECTOMY    . OSTOMY  05/22/2012   diverticulitis with abcess/ Dr Tamala Julian  . ovarian cancer     Exploratory lap  . TONSILLECTOMY AND ADENOIDECTOMY  1947    FAMILY HISTORY: family history includes Breast cancer in her cousin, cousin, and cousin; Cancer in her brother, brother, maternal aunt, and mother; Coronary artery disease in her brother and mother; Diabetes in her brother, father, and mother; Heart disease in her mother and sister; Hypertension in her mother; Kidney disease in her mother; Kidney failure in her father; Stroke in her sister.  SOCIAL HISTORY:  reports that she has never smoked. She has never used smokeless tobacco. She reports that she does not drink alcohol or use drugs.  ALLERGIES: Patient has no known allergies.  MEDICATIONS:  Current Outpatient Medications  Medication Sig Dispense Refill  . Calcium  Carbonate-Vitamin D 600-400 MG-UNIT tablet Take by mouth.     No current facility-administered medications for this encounter.     ECOG PERFORMANCE STATUS:  0 - Asymptomatic  REVIEW OF SYSTEMS:  Patient denies any weight loss, fatigue, weakness, fever, chills or night sweats. Patient denies any loss of vision, blurred vision. Patient denies any ringing  of the ears or hearing loss. No irregular heartbeat. Patient denies heart murmur or history of fainting. Patient denies any chest pain or pain radiating to her upper extremities. Patient denies any shortness of breath, difficulty breathing at night, cough or hemoptysis. Patient denies any swelling in the lower legs. Patient denies any nausea vomiting, vomiting of blood, or coffee ground material in the vomitus. Patient denies any stomach pain. Patient states has had normal bowel movements no significant constipation or diarrhea. Patient denies any dysuria, hematuria or significant nocturia. Patient denies any problems walking, swelling in the joints or loss of balance. Patient denies any skin changes, loss of hair or loss of weight. Patient denies any excessive worrying or anxiety or significant depression. Patient denies any problems with insomnia. Patient denies excessive thirst, polyuria, polydipsia. Patient denies any swollen glands, patient denies easy bruising or easy bleeding. Patient denies any recent infections, allergies or URI. Patient "s visual fields have not changed significantly in recent time.    PHYSICAL EXAM: BP (!) 142/72 (BP Location: Left Arm, Patient Position: Sitting)   Pulse 72   Temp 97.8 F (36.6 C)   Resp 16   Wt 168 lb 1.6 oz (76.2 kg)   BMI 29.78 kg/m  Patient is status post wide local excision of the right breast. No dominant mass or nodularity is noted in either breast in 2 positions examined. No axillary or supraclavicular adenopathy is identified.Well-developed well-nourished patient in NAD. HEENT reveals PERLA,  EOMI, discs not visualized.  Oral cavity is clear. No oral mucosal lesions are identified. Neck is clear without evidence of cervical or supraclavicular adenopathy. Lungs are clear to A&P. Cardiac examination is essentially unremarkable with regular rate and rhythm without murmur rub or thrill. Abdomen is benign with no organomegaly or masses noted. Motor sensory and DTR levels are equal and symmetric in the upper and lower extremities. Cranial nerves II through XII are grossly intact. Proprioception is intact. No peripheral adenopathy or edema is identified. No motor or sensory levels are noted. Crude visual fields are within normal range.  LABORATORY DATA: pathology reports reviewed    RADIOLOGY RESULTS:ammogram reports reviewed   IMPRESSION: stage I (T1 N0 M0) triple positive invasive mammary carcinoma the right breast as was wide local excision and sentinel node biopsy in 84 year old female  PLAN: at this time I recommended whole breast radiation. Her breasts may be too large for hypofractionated course of treatment I would plan on delivering5040 cGy in 28 fractions boosting her scar another 1400 cGy using electron beam risks and benefits of treatment including skin reaction fatigue alteration of blood counts possible inclusion of superficial lung all were discussed in detail with the patient and her friend. Patient will be sequence with chemotherapy prior to initiating radiation therapy. I will see her in follow-up after her completion of her Taxol chemotherapy and begin treatment planning for her whole breast.  Patient seems to comprehend my treatment plan well.  I would like to take this opportunity to thank you for allowing me to participate in the care of your patient.Noreene Filbert, MD

## 2018-05-15 NOTE — Patient Instructions (Signed)
Trastuzumab injection for infusion What is this medicine? TRASTUZUMAB (tras TOO zoo mab) is a monoclonal antibody. It is used to treat breast cancer and stomach cancer. This medicine may be used for other purposes; ask your health care provider or pharmacist if you have questions. COMMON BRAND NAME(S): Herceptin What should I tell my health care provider before I take this medicine? They need to know if you have any of these conditions: -heart disease -heart failure -lung or breathing disease, like asthma -an unusual or allergic reaction to trastuzumab, benzyl alcohol, or other medications, foods, dyes, or preservatives -pregnant or trying to get pregnant -breast-feeding How should I use this medicine? This drug is given as an infusion into a vein. It is administered in a hospital or clinic by a specially trained health care professional. Talk to your pediatrician regarding the use of this medicine in children. This medicine is not approved for use in children. Overdosage: If you think you have taken too much of this medicine contact a poison control center or emergency room at once. NOTE: This medicine is only for you. Do not share this medicine with others. What if I miss a dose? It is important not to miss a dose. Call your doctor or health care professional if you are unable to keep an appointment. What may interact with this medicine? This medicine may interact with the following medications: -certain types of chemotherapy, such as daunorubicin, doxorubicin, epirubicin, and idarubicin This list may not describe all possible interactions. Give your health care provider a list of all the medicines, herbs, non-prescription drugs, or dietary supplements you use. Also tell them if you smoke, drink alcohol, or use illegal drugs. Some items may interact with your medicine. What should I watch for while using this medicine? Visit your doctor for checks on your progress. Report any side effects.  Continue your course of treatment even though you feel ill unless your doctor tells you to stop. Call your doctor or health care professional for advice if you get a fever, chills or sore throat, or other symptoms of a cold or flu. Do not treat yourself. Try to avoid being around people who are sick. You may experience fever, chills and shaking during your first infusion. These effects are usually mild and can be treated with other medicines. Report any side effects during the infusion to your health care professional. Fever and chills usually do not happen with later infusions. Do not become pregnant while taking this medicine or for 7 months after stopping it. Women should inform their doctor if they wish to become pregnant or think they might be pregnant. Women of child-bearing potential will need to have a negative pregnancy test before starting this medicine. There is a potential for serious side effects to an unborn child. Talk to your health care professional or pharmacist for more information. Do not breast-feed an infant while taking this medicine or for 7 months after stopping it. Women must use effective birth control with this medicine. What side effects may I notice from receiving this medicine? Side effects that you should report to your doctor or health care professional as soon as possible: -allergic reactions like skin rash, itching or hives, swelling of the face, lips, or tongue -chest pain or palpitations -cough -dizziness -feeling faint or lightheaded, falls -fever -general ill feeling or flu-like symptoms -signs of worsening heart failure like breathing problems; swelling in your legs and feet -unusually weak or tired Side effects that usually do not require medical   attention (report to your doctor or health care professional if they continue or are bothersome): -bone pain -changes in taste -diarrhea -joint pain -nausea/vomiting -weight loss This list may not describe all  possible side effects. Call your doctor for medical advice about side effects. You may report side effects to FDA at 1-800-FDA-1088. Where should I keep my medicine? This drug is given in a hospital or clinic and will not be stored at home. NOTE: This sheet is a summary. It may not cover all possible information. If you have questions about this medicine, talk to your doctor, pharmacist, or health care provider.  2018 Elsevier/Gold Standard (2016-05-22 14:37:52) Paclitaxel injection What is this medicine? PACLITAXEL (PAK li TAX el) is a chemotherapy drug. It targets fast dividing cells, like cancer cells, and causes these cells to die. This medicine is used to treat ovarian cancer, breast cancer, and other cancers. This medicine may be used for other purposes; ask your health care provider or pharmacist if you have questions. COMMON BRAND NAME(S): Onxol, Taxol What should I tell my health care provider before I take this medicine? They need to know if you have any of these conditions: -blood disorders -irregular heartbeat -infection (especially a virus infection such as chickenpox, cold sores, or herpes) -liver disease -previous or ongoing radiation therapy -an unusual or allergic reaction to paclitaxel, alcohol, polyoxyethylated castor oil, other chemotherapy agents, other medicines, foods, dyes, or preservatives -pregnant or trying to get pregnant -breast-feeding How should I use this medicine? This drug is given as an infusion into a vein. It is administered in a hospital or clinic by a specially trained health care professional. Talk to your pediatrician regarding the use of this medicine in children. Special care may be needed. Overdosage: If you think you have taken too much of this medicine contact a poison control center or emergency room at once. NOTE: This medicine is only for you. Do not share this medicine with others. What if I miss a dose? It is important not to miss your  dose. Call your doctor or health care professional if you are unable to keep an appointment. What may interact with this medicine? Do not take this medicine with any of the following medications: -disulfiram -metronidazole This medicine may also interact with the following medications: -cyclosporine -diazepam -ketoconazole -medicines to increase blood counts like filgrastim, pegfilgrastim, sargramostim -other chemotherapy drugs like cisplatin, doxorubicin, epirubicin, etoposide, teniposide, vincristine -quinidine -testosterone -vaccines -verapamil Talk to your doctor or health care professional before taking any of these medicines: -acetaminophen -aspirin -ibuprofen -ketoprofen -naproxen This list may not describe all possible interactions. Give your health care provider a list of all the medicines, herbs, non-prescription drugs, or dietary supplements you use. Also tell them if you smoke, drink alcohol, or use illegal drugs. Some items may interact with your medicine. What should I watch for while using this medicine? Your condition will be monitored carefully while you are receiving this medicine. You will need important blood work done while you are taking this medicine. This medicine can cause serious allergic reactions. To reduce your risk you will need to take other medicine(s) before treatment with this medicine. If you experience allergic reactions like skin rash, itching or hives, swelling of the face, lips, or tongue, tell your doctor or health care professional right away. In some cases, you may be given additional medicines to help with side effects. Follow all directions for their use. This drug may make you feel generally unwell. This is not uncommon,   as chemotherapy can affect healthy cells as well as cancer cells. Report any side effects. Continue your course of treatment even though you feel ill unless your doctor tells you to stop. Call your doctor or health care  professional for advice if you get a fever, chills or sore throat, or other symptoms of a cold or flu. Do not treat yourself. This drug decreases your body's ability to fight infections. Try to avoid being around people who are sick. This medicine may increase your risk to bruise or bleed. Call your doctor or health care professional if you notice any unusual bleeding. Be careful brushing and flossing your teeth or using a toothpick because you may get an infection or bleed more easily. If you have any dental work done, tell your dentist you are receiving this medicine. Avoid taking products that contain aspirin, acetaminophen, ibuprofen, naproxen, or ketoprofen unless instructed by your doctor. These medicines may hide a fever. Do not become pregnant while taking this medicine. Women should inform their doctor if they wish to become pregnant or think they might be pregnant. There is a potential for serious side effects to an unborn child. Talk to your health care professional or pharmacist for more information. Do not breast-feed an infant while taking this medicine. Men are advised not to father a child while receiving this medicine. This product may contain alcohol. Ask your pharmacist or healthcare provider if this medicine contains alcohol. Be sure to tell all healthcare providers you are taking this medicine. Certain medicines, like metronidazole and disulfiram, can cause an unpleasant reaction when taken with alcohol. The reaction includes flushing, headache, nausea, vomiting, sweating, and increased thirst. The reaction can last from 30 minutes to several hours. What side effects may I notice from receiving this medicine? Side effects that you should report to your doctor or health care professional as soon as possible: -allergic reactions like skin rash, itching or hives, swelling of the face, lips, or tongue -low blood counts - This drug may decrease the number of white blood cells, red blood  cells and platelets. You may be at increased risk for infections and bleeding. -signs of infection - fever or chills, cough, sore throat, pain or difficulty passing urine -signs of decreased platelets or bleeding - bruising, pinpoint red spots on the skin, black, tarry stools, nosebleeds -signs of decreased red blood cells - unusually weak or tired, fainting spells, lightheadedness -breathing problems -chest pain -high or low blood pressure -mouth sores -nausea and vomiting -pain, swelling, redness or irritation at the injection site -pain, tingling, numbness in the hands or feet -slow or irregular heartbeat -swelling of the ankle, feet, hands Side effects that usually do not require medical attention (report to your doctor or health care professional if they continue or are bothersome): -bone pain -complete hair loss including hair on your head, underarms, pubic hair, eyebrows, and eyelashes -changes in the color of fingernails -diarrhea -loosening of the fingernails -loss of appetite -muscle or joint pain -red flush to skin -sweating This list may not describe all possible side effects. Call your doctor for medical advice about side effects. You may report side effects to FDA at 1-800-FDA-1088. Where should I keep my medicine? This drug is given in a hospital or clinic and will not be stored at home. NOTE: This sheet is a summary. It may not cover all possible information. If you have questions about this medicine, talk to your doctor, pharmacist, or health care provider.  2018 Elsevier/Gold Standard (2015-03-29   19:58:00)  

## 2018-05-15 NOTE — Telephone Encounter (Signed)
done

## 2018-05-16 ENCOUNTER — Other Ambulatory Visit: Payer: Medicare HMO

## 2018-05-16 DIAGNOSIS — Z170 Estrogen receptor positive status [ER+]: Secondary | ICD-10-CM | POA: Diagnosis not present

## 2018-05-16 DIAGNOSIS — C50411 Malignant neoplasm of upper-outer quadrant of right female breast: Secondary | ICD-10-CM | POA: Diagnosis not present

## 2018-05-19 ENCOUNTER — Inpatient Hospital Stay: Payer: Medicare HMO | Attending: Internal Medicine

## 2018-05-19 DIAGNOSIS — G629 Polyneuropathy, unspecified: Secondary | ICD-10-CM | POA: Insufficient documentation

## 2018-05-19 DIAGNOSIS — Z8543 Personal history of malignant neoplasm of ovary: Secondary | ICD-10-CM | POA: Insufficient documentation

## 2018-05-19 DIAGNOSIS — Z5112 Encounter for antineoplastic immunotherapy: Secondary | ICD-10-CM | POA: Insufficient documentation

## 2018-05-19 DIAGNOSIS — C50411 Malignant neoplasm of upper-outer quadrant of right female breast: Secondary | ICD-10-CM | POA: Insufficient documentation

## 2018-05-19 DIAGNOSIS — Z5111 Encounter for antineoplastic chemotherapy: Secondary | ICD-10-CM | POA: Insufficient documentation

## 2018-05-19 DIAGNOSIS — Z452 Encounter for adjustment and management of vascular access device: Secondary | ICD-10-CM | POA: Insufficient documentation

## 2018-05-19 DIAGNOSIS — Z170 Estrogen receptor positive status [ER+]: Secondary | ICD-10-CM | POA: Insufficient documentation

## 2018-05-20 ENCOUNTER — Encounter: Payer: Self-pay | Admitting: Internal Medicine

## 2018-05-20 ENCOUNTER — Inpatient Hospital Stay: Payer: Medicare HMO

## 2018-05-20 ENCOUNTER — Inpatient Hospital Stay (HOSPITAL_BASED_OUTPATIENT_CLINIC_OR_DEPARTMENT_OTHER): Payer: Medicare HMO | Admitting: Internal Medicine

## 2018-05-20 ENCOUNTER — Other Ambulatory Visit: Payer: Self-pay

## 2018-05-20 ENCOUNTER — Other Ambulatory Visit: Payer: Self-pay | Admitting: *Deleted

## 2018-05-20 ENCOUNTER — Telehealth (INDEPENDENT_AMBULATORY_CARE_PROVIDER_SITE_OTHER): Payer: Self-pay

## 2018-05-20 VITALS — BP 147/61 | HR 64 | Temp 97.9°F | Resp 16 | Wt 169.1 lb

## 2018-05-20 VITALS — BP 170/78 | HR 93 | Resp 16

## 2018-05-20 DIAGNOSIS — Z452 Encounter for adjustment and management of vascular access device: Secondary | ICD-10-CM | POA: Diagnosis not present

## 2018-05-20 DIAGNOSIS — Z8543 Personal history of malignant neoplasm of ovary: Secondary | ICD-10-CM

## 2018-05-20 DIAGNOSIS — C50411 Malignant neoplasm of upper-outer quadrant of right female breast: Secondary | ICD-10-CM

## 2018-05-20 DIAGNOSIS — Z5111 Encounter for antineoplastic chemotherapy: Secondary | ICD-10-CM | POA: Diagnosis present

## 2018-05-20 DIAGNOSIS — Z170 Estrogen receptor positive status [ER+]: Principal | ICD-10-CM

## 2018-05-20 DIAGNOSIS — G629 Polyneuropathy, unspecified: Secondary | ICD-10-CM | POA: Diagnosis not present

## 2018-05-20 DIAGNOSIS — Z5112 Encounter for antineoplastic immunotherapy: Secondary | ICD-10-CM | POA: Diagnosis present

## 2018-05-20 LAB — COMPREHENSIVE METABOLIC PANEL
ALK PHOS: 89 U/L (ref 38–126)
ALT: 18 U/L (ref 0–44)
ANION GAP: 5 (ref 5–15)
AST: 22 U/L (ref 15–41)
Albumin: 3.3 g/dL — ABNORMAL LOW (ref 3.5–5.0)
BILIRUBIN TOTAL: 0.5 mg/dL (ref 0.3–1.2)
BUN: 17 mg/dL (ref 8–23)
CO2: 28 mmol/L (ref 22–32)
Calcium: 8.6 mg/dL — ABNORMAL LOW (ref 8.9–10.3)
Chloride: 107 mmol/L (ref 98–111)
Creatinine, Ser: 0.54 mg/dL (ref 0.44–1.00)
Glucose, Bld: 81 mg/dL (ref 70–99)
Potassium: 3.9 mmol/L (ref 3.5–5.1)
SODIUM: 140 mmol/L (ref 135–145)
Total Protein: 6.2 g/dL — ABNORMAL LOW (ref 6.5–8.1)

## 2018-05-20 LAB — CBC WITH DIFFERENTIAL/PLATELET
Abs Immature Granulocytes: 0.04 10*3/uL (ref 0.00–0.07)
BASOS ABS: 0.1 10*3/uL (ref 0.0–0.1)
Basophils Relative: 1 %
Eosinophils Absolute: 0 10*3/uL (ref 0.0–0.5)
Eosinophils Relative: 0 %
HEMATOCRIT: 38.5 % (ref 36.0–46.0)
HEMOGLOBIN: 12.2 g/dL (ref 12.0–15.0)
IMMATURE GRANULOCYTES: 0 %
LYMPHS ABS: 2 10*3/uL (ref 0.7–4.0)
LYMPHS PCT: 20 %
MCH: 30.4 pg (ref 26.0–34.0)
MCHC: 31.7 g/dL (ref 30.0–36.0)
MCV: 96 fL (ref 80.0–100.0)
Monocytes Absolute: 0.7 10*3/uL (ref 0.1–1.0)
Monocytes Relative: 7 %
NEUTROS PCT: 72 %
NRBC: 0 % (ref 0.0–0.2)
Neutro Abs: 7.1 10*3/uL (ref 1.7–7.7)
Platelets: 277 10*3/uL (ref 150–400)
RBC: 4.01 MIL/uL (ref 3.87–5.11)
RDW: 13.9 % (ref 11.5–15.5)
WBC: 9.9 10*3/uL (ref 4.0–10.5)

## 2018-05-20 MED ORDER — ACETAMINOPHEN 325 MG PO TABS
650.0000 mg | ORAL_TABLET | Freq: Once | ORAL | Status: AC
  Administered 2018-05-20: 650 mg via ORAL
  Filled 2018-05-20: qty 2

## 2018-05-20 MED ORDER — SODIUM CHLORIDE 0.9 % IV SOLN
Freq: Once | INTRAVENOUS | Status: AC
  Administered 2018-05-20: 10:00:00 via INTRAVENOUS
  Filled 2018-05-20: qty 250

## 2018-05-20 MED ORDER — FAMOTIDINE IN NACL 20-0.9 MG/50ML-% IV SOLN
20.0000 mg | Freq: Once | INTRAVENOUS | Status: AC
  Administered 2018-05-20: 20 mg via INTRAVENOUS
  Filled 2018-05-20: qty 50

## 2018-05-20 MED ORDER — SODIUM CHLORIDE 0.9 % IV SOLN
80.0000 mg/m2 | Freq: Once | INTRAVENOUS | Status: AC
  Administered 2018-05-20: 150 mg via INTRAVENOUS
  Filled 2018-05-20: qty 25

## 2018-05-20 MED ORDER — TRASTUZUMAB CHEMO 150 MG IV SOLR
300.0000 mg | Freq: Once | INTRAVENOUS | Status: AC
  Administered 2018-05-20: 300 mg via INTRAVENOUS
  Filled 2018-05-20: qty 14.29

## 2018-05-20 MED ORDER — DIPHENHYDRAMINE HCL 50 MG/ML IJ SOLN
50.0000 mg | Freq: Once | INTRAMUSCULAR | Status: AC
  Administered 2018-05-20: 50 mg via INTRAVENOUS
  Filled 2018-05-20: qty 1

## 2018-05-20 MED ORDER — SODIUM CHLORIDE 0.9 % IV SOLN
20.0000 mg | Freq: Once | INTRAVENOUS | Status: AC
  Administered 2018-05-20: 20 mg via INTRAVENOUS
  Filled 2018-05-20: qty 2

## 2018-05-20 NOTE — Assessment & Plan Note (Addendum)
#   Stage I ER/PR-pos; Her 2 Neu amplified breast cancer.  Plan adjuvant chemo-taxol-Herceptin starting today.   # proceed with Taxol-herpcetin today. Labs today reviewed;  acceptable for treatment today.   #Again reviewed the schedule potential side effects in detail.  We will plan to add radiation after finishing Taxol.   #IV access discussed regarding placement patient interested.  Will make a referral  # DISPOSITION: # vascular referral-for port placement asap.  # Treatment today # in 1 week-cbc/bmp/taxol-herceptin # in 2 week-MD-cbc/cmp/taxol-herceptin

## 2018-05-20 NOTE — Telephone Encounter (Signed)
Patient was called and a message left for a return call.

## 2018-05-20 NOTE — Progress Notes (Signed)
Consent signed by patient and copy placed in binder.  Procedure of chemotherapy and premeds all explained to patient and patient verbalized understanding.  Next two treatments scheduled and times reviewed with patient and friend.

## 2018-05-20 NOTE — Patient Instructions (Signed)
Paclitaxel injection What is this medicine? PACLITAXEL (PAK li TAX el) is a chemotherapy drug. It targets fast dividing cells, like cancer cells, and causes these cells to die. This medicine is used to treat ovarian cancer, breast cancer, and other cancers. This medicine may be used for other purposes; ask your health care provider or pharmacist if you have questions. COMMON BRAND NAME(S): Onxol, Taxol What should I tell my health care provider before I take this medicine? They need to know if you have any of these conditions: -blood disorders -irregular heartbeat -infection (especially a virus infection such as chickenpox, cold sores, or herpes) -liver disease -previous or ongoing radiation therapy -an unusual or allergic reaction to paclitaxel, alcohol, polyoxyethylated castor oil, other chemotherapy agents, other medicines, foods, dyes, or preservatives -pregnant or trying to get pregnant -breast-feeding How should I use this medicine? This drug is given as an infusion into a vein. It is administered in a hospital or clinic by a specially trained health care professional. Talk to your pediatrician regarding the use of this medicine in children. Special care may be needed. Overdosage: If you think you have taken too much of this medicine contact a poison control center or emergency room at once. NOTE: This medicine is only for you. Do not share this medicine with others. What if I miss a dose? It is important not to miss your dose. Call your doctor or health care professional if you are unable to keep an appointment. What may interact with this medicine? Do not take this medicine with any of the following medications: -disulfiram -metronidazole This medicine may also interact with the following medications: -cyclosporine -diazepam -ketoconazole -medicines to increase blood counts like filgrastim, pegfilgrastim, sargramostim -other chemotherapy drugs like cisplatin, doxorubicin,  epirubicin, etoposide, teniposide, vincristine -quinidine -testosterone -vaccines -verapamil Talk to your doctor or health care professional before taking any of these medicines: -acetaminophen -aspirin -ibuprofen -ketoprofen -naproxen This list may not describe all possible interactions. Give your health care provider a list of all the medicines, herbs, non-prescription drugs, or dietary supplements you use. Also tell them if you smoke, drink alcohol, or use illegal drugs. Some items may interact with your medicine. What should I watch for while using this medicine? Your condition will be monitored carefully while you are receiving this medicine. You will need important blood work done while you are taking this medicine. This medicine can cause serious allergic reactions. To reduce your risk you will need to take other medicine(s) before treatment with this medicine. If you experience allergic reactions like skin rash, itching or hives, swelling of the face, lips, or tongue, tell your doctor or health care professional right away. In some cases, you may be given additional medicines to help with side effects. Follow all directions for their use. This drug may make you feel generally unwell. This is not uncommon, as chemotherapy can affect healthy cells as well as cancer cells. Report any side effects. Continue your course of treatment even though you feel ill unless your doctor tells you to stop. Call your doctor or health care professional for advice if you get a fever, chills or sore throat, or other symptoms of a cold or flu. Do not treat yourself. This drug decreases your body's ability to fight infections. Try to avoid being around people who are sick. This medicine may increase your risk to bruise or bleed. Call your doctor or health care professional if you notice any unusual bleeding. Be careful brushing and flossing your teeth or   using a toothpick because you may get an infection or  bleed more easily. If you have any dental work done, tell your dentist you are receiving this medicine. Avoid taking products that contain aspirin, acetaminophen, ibuprofen, naproxen, or ketoprofen unless instructed by your doctor. These medicines may hide a fever. Do not become pregnant while taking this medicine. Women should inform their doctor if they wish to become pregnant or think they might be pregnant. There is a potential for serious side effects to an unborn child. Talk to your health care professional or pharmacist for more information. Do not breast-feed an infant while taking this medicine. Men are advised not to father a child while receiving this medicine. This product may contain alcohol. Ask your pharmacist or healthcare provider if this medicine contains alcohol. Be sure to tell all healthcare providers you are taking this medicine. Certain medicines, like metronidazole and disulfiram, can cause an unpleasant reaction when taken with alcohol. The reaction includes flushing, headache, nausea, vomiting, sweating, and increased thirst. The reaction can last from 30 minutes to several hours. What side effects may I notice from receiving this medicine? Side effects that you should report to your doctor or health care professional as soon as possible: -allergic reactions like skin rash, itching or hives, swelling of the face, lips, or tongue -low blood counts - This drug may decrease the number of white blood cells, red blood cells and platelets. You may be at increased risk for infections and bleeding. -signs of infection - fever or chills, cough, sore throat, pain or difficulty passing urine -signs of decreased platelets or bleeding - bruising, pinpoint red spots on the skin, black, tarry stools, nosebleeds -signs of decreased red blood cells - unusually weak or tired, fainting spells, lightheadedness -breathing problems -chest pain -high or low blood pressure -mouth sores -nausea and  vomiting -pain, swelling, redness or irritation at the injection site -pain, tingling, numbness in the hands or feet -slow or irregular heartbeat -swelling of the ankle, feet, hands Side effects that usually do not require medical attention (report to your doctor or health care professional if they continue or are bothersome): -bone pain -complete hair loss including hair on your head, underarms, pubic hair, eyebrows, and eyelashes -changes in the color of fingernails -diarrhea -loosening of the fingernails -loss of appetite -muscle or joint pain -red flush to skin -sweating This list may not describe all possible side effects. Call your doctor for medical advice about side effects. You may report side effects to FDA at 1-800-FDA-1088. Where should I keep my medicine? This drug is given in a hospital or clinic and will not be stored at home. NOTE: This sheet is a summary. It may not cover all possible information. If you have questions about this medicine, talk to your doctor, pharmacist, or health care provider.  2018 Elsevier/Gold Standard (2015-03-29 19:58:00) Trastuzumab injection for infusion What is this medicine? TRASTUZUMAB (tras TOO zoo mab) is a monoclonal antibody. It is used to treat breast cancer and stomach cancer. This medicine may be used for other purposes; ask your health care provider or pharmacist if you have questions. COMMON BRAND NAME(S): Herceptin What should I tell my health care provider before I take this medicine? They need to know if you have any of these conditions: -heart disease -heart failure -lung or breathing disease, like asthma -an unusual or allergic reaction to trastuzumab, benzyl alcohol, or other medications, foods, dyes, or preservatives -pregnant or trying to get pregnant -breast-feeding How should I  use this medicine? This drug is given as an infusion into a vein. It is administered in a hospital or clinic by a specially trained health  care professional. Talk to your pediatrician regarding the use of this medicine in children. This medicine is not approved for use in children. Overdosage: If you think you have taken too much of this medicine contact a poison control center or emergency room at once. NOTE: This medicine is only for you. Do not share this medicine with others. What if I miss a dose? It is important not to miss a dose. Call your doctor or health care professional if you are unable to keep an appointment. What may interact with this medicine? This medicine may interact with the following medications: -certain types of chemotherapy, such as daunorubicin, doxorubicin, epirubicin, and idarubicin This list may not describe all possible interactions. Give your health care provider a list of all the medicines, herbs, non-prescription drugs, or dietary supplements you use. Also tell them if you smoke, drink alcohol, or use illegal drugs. Some items may interact with your medicine. What should I watch for while using this medicine? Visit your doctor for checks on your progress. Report any side effects. Continue your course of treatment even though you feel ill unless your doctor tells you to stop. Call your doctor or health care professional for advice if you get a fever, chills or sore throat, or other symptoms of a cold or flu. Do not treat yourself. Try to avoid being around people who are sick. You may experience fever, chills and shaking during your first infusion. These effects are usually mild and can be treated with other medicines. Report any side effects during the infusion to your health care professional. Fever and chills usually do not happen with later infusions. Do not become pregnant while taking this medicine or for 7 months after stopping it. Women should inform their doctor if they wish to become pregnant or think they might be pregnant. Women of child-bearing potential will need to have a negative pregnancy  test before starting this medicine. There is a potential for serious side effects to an unborn child. Talk to your health care professional or pharmacist for more information. Do not breast-feed an infant while taking this medicine or for 7 months after stopping it. Women must use effective birth control with this medicine. What side effects may I notice from receiving this medicine? Side effects that you should report to your doctor or health care professional as soon as possible: -allergic reactions like skin rash, itching or hives, swelling of the face, lips, or tongue -chest pain or palpitations -cough -dizziness -feeling faint or lightheaded, falls -fever -general ill feeling or flu-like symptoms -signs of worsening heart failure like breathing problems; swelling in your legs and feet -unusually weak or tired Side effects that usually do not require medical attention (report to your doctor or health care professional if they continue or are bothersome): -bone pain -changes in taste -diarrhea -joint pain -nausea/vomiting -weight loss This list may not describe all possible side effects. Call your doctor for medical advice about side effects. You may report side effects to FDA at 1-800-FDA-1088. Where should I keep my medicine? This drug is given in a hospital or clinic and will not be stored at home. NOTE: This sheet is a summary. It may not cover all possible information. If you have questions about this medicine, talk to your doctor, pharmacist, or health care provider.  2018 Elsevier/Gold Standard (2016-05-22  14:37:52)  

## 2018-05-20 NOTE — Progress Notes (Signed)
[name] CONSULT NOTE  Patient Care Team: Einar Pheasant, MD as PCP - General (Internal Medicine) Caryl Bis Angela Adam, MD as Consulting Physician (Family Medicine) Christene Lye, MD (General Surgery) Einar Pheasant, MD (Internal Medicine) Bary Castilla Forest Gleason, MD (General Surgery)  CHIEF COMPLAINTS/PURPOSE OF CONSULTATION:    #  Oncology History   # OCT 2019- RUOQ IMC-stage I [lumpectomy sentinel lymph node biopsy [facility]; T=0.6 cm; N=0] ER/PR-POSITIVE;her 2 neu-AMPLIFIED [2.28]  # Dec 10th 2019- Taxol-Herceptin weekly x12.   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 Last tine she saw GYN oncology was 2011 (Dr. Erasmo Leventhal)  # Genetic testing? [[facility]]  DIAGNOSIS: Breast cancer  STAGE:  I  ;GOALS: cure  CURRENT/MOST RECENT THERAPY: Taxol+herceptin      Carcinoma of upper-outer quadrant of right breast in female, estrogen receptor positive ([place])      HISTORY OF PRESENTING ILLNESS:  [name] [name] 84 y.o.  female with stage I ER PR positive HER-2/neu amplified breast cancer is here for follow-up/proceed with chemotherapy.  Patient recently met with radiation oncology; who also recommended systemic therapy before radiation.  Patient also evaluated by [facility] oncology.    Patient finally made patient to proceed with treatment. She denies any unusual shortness of breath or cough.  Denies any headaches.   Review of Systems  Constitutional: Negative for chills, diaphoresis, fever, malaise/fatigue and weight loss.  HENT: Negative for nosebleeds and sore throat.   Eyes: Negative for double vision.  Respiratory: Negative for cough, hemoptysis, sputum production, shortness  of breath and wheezing.   Cardiovascular: Negative for chest pain, palpitations, orthopnea and leg swelling.  Gastrointestinal: Negative for abdominal pain, blood in stool, constipation, diarrhea, heartburn, melena, nausea and vomiting.  Genitourinary: Negative for dysuria, frequency and urgency.  Musculoskeletal: Negative for back pain and joint pain.  Skin: Negative.  Negative for itching and rash.  Neurological: Negative for dizziness, tingling, focal weakness, weakness and headaches.  Endo/Heme/Allergies: Does not bruise/bleed easily.  Psychiatric/Behavioral: Negative for depression. The patient is not nervous/anxious and does not have insomnia.      MEDICAL HISTORY:  Past Medical History:  Diagnosis Date  . Diverticulitis of sigmoid colon   . Endometriosis   . Fibrocystic breast disease   . Glaucoma   . H/O urinary incontinence   . History of chicken pox   . History of colon polyps 2014  . Hyperlipidemia   . Hypertension   . Ovarian cancer ([place]) 2002   Chemo tx's @ [facility] with Total Hysterectomy. Dr Amalia Hailey  . Personal history of chemotherapy    ovarian cancer  . Sinusitis   . Tuberculosis    positve skin test    SURGICAL HISTORY: Past Surgical History:  Procedure Laterality Date  . ABDOMINAL HYSTERECTOMY  2003   complete Dr Amalia Hailey  . ABSCESS DRAINAGE  2017   right buttock at [facility]  . APPENDECTOMY  2003  . BREAST EXCISIONAL BIOPSY Left 1993   excisional bx. negative results  . BREAST SURGERY Left 1982   papilloma removal  . COLONOSCOPY  2006, 2014   Dr Vira Agar   . COLONOSCOPY N/A 03/27/2016   Procedure: COLONOSCOPY;  Surgeon: Robert Bellow, MD;  Location: [facility] ENDOSCOPY;  Service: Endoscopy;  Laterality: N/A;  .  COLOSTOMY REVERSAL  2014   at [facility] Dr Raynaldo Opitz  . NASAL SINUS SURGERY     For fungal infection  . NASAL SINUS SURGERY  8/05 8/06  . OOPHORECTOMY    . OSTOMY  05/22/2012   diverticulitis with abcess/ Dr Tamala Julian  . ovarian cancer     Exploratory lap   . TONSILLECTOMY AND ADENOIDECTOMY  1947    SOCIAL HISTORY: Social History   Socioeconomic History  . Marital status: Widowed    Spouse name: Not on file  . Number of children: 0  . Years of education: Not on file  . Highest education level: Not on file  Occupational History  . Not on file  Social Needs  . Financial resource strain: Not hard at all  . Food insecurity:    Worry: Never true    Inability: Never true  . Transportation needs:    Medical: No    Non-medical: No  Tobacco Use  . Smoking status: Never Smoker  . Smokeless tobacco: Never Used  Substance and Sexual Activity  . Alcohol use: No    Alcohol/week: 0.0 standard drinks  . Drug use: No  . Sexual activity: Not Currently  Lifestyle  . Physical activity:    Days per week: Not on file    Minutes per session: Not on file  . Stress: Not on file  Relationships  . Social connections:    Talks on phone: Not on file    Gets together: Not on file    Attends religious service: Not on file    Active member of club or organization: Not on file    Attends meetings of clubs or organizations: Not on file    Relationship status: Not on file  . Intimate partner violence:    Fear of current or ex partner: Not on file    Emotionally abused: Not on file    Physically abused: Not on file    Forced sexual activity: Not on file  Other Topics Concern  . Not on file  Social History Narrative  . Not on file    FAMILY HISTORY: Family History  Problem Relation Age of Onset  . Hypertension Mother   . Coronary artery disease Mother   . Heart disease Mother   . Kidney disease Mother   . Diabetes Mother   . Cancer Mother        uterine cancer  . Diabetes Father   . Kidney failure Father   . Diabetes Brother   . Coronary artery disease Brother   . Cancer Brother        bladder cancer  . Stroke Sister   . Heart disease Sister        [facility]  . Cancer Maternal Aunt        ovarian cancer  . Breast cancer Cousin    . Cancer Brother        Prostate  . Breast cancer Cousin   . Breast cancer Cousin     ALLERGIES:  has No Known Allergies.  MEDICATIONS:  Current Outpatient Medications  Medication Sig Dispense Refill  . Calcium Carbonate-Vitamin D 600-400 MG-UNIT tablet Take by mouth.     No current facility-administered medications for this visit.       Marland Kitchen  PHYSICAL EXAMINATION: ECOG PERFORMANCE STATUS: 0 - Asymptomatic  Vitals:   05/20/18 0854  BP: (!) 147/61  Pulse: 64  Resp: 16  Temp: 97.9 F (36.6 C)   Filed Weights  05/20/18 0854  Weight: 169 lb 1.5 oz (76.7 kg)    Physical Exam  Constitutional: She is oriented to person, place, and time and well-developed, well-nourished, and in no distress.  She is accompanied by a friend.  HENT:  Head: Normocephalic and atraumatic.  Mouth/Throat: Oropharynx is clear and moist. No oropharyngeal exudate.  Eyes: Pupils are equal, round, and reactive to light.  Neck: Normal range of motion. Neck supple.  Cardiovascular: Normal rate and regular rhythm.  Pulmonary/Chest: No respiratory distress. She has no wheezes.  Abdominal: Soft. Bowel sounds are normal. She exhibits no distension and no mass. There is no tenderness. There is no rebound and no guarding.  Musculoskeletal: Normal range of motion. She exhibits no edema or tenderness.  Neurological: She is alert and oriented to person, place, and time.  Skin: Skin is warm.  Psychiatric: Affect normal.     LABORATORY DATA:  I have reviewed the data as listed Lab Results  Component Value Date   WBC 9.9 05/20/2018   HGB 12.2 05/20/2018   HCT 38.5 05/20/2018   MCV 96.0 05/20/2018   PLT 277 05/20/2018   Recent Labs    09/18/17 0937 01/21/18 0840 05/20/18 0826  NA 142 140 140  K 4.8 4.3 3.9  CL 107 107 107  CO2 _0 GLUCOSE 87 97 81  BUN _1 CREATININE 0.66 0.65 0.54  CALCIUM 9.2 9.1 8.6*  GFRNONAA  --   --  >60  GFRAA  --   --  >60  PROT  --  6.4 6.2*  ALBUMIN   --  3.6 3.3*  AST  --  15 22  ALT  --  14 18  ALKPHOS  --  99 89  BILITOT  --  0.6 0.5  BILIDIR  --  0.1  --     RADIOGRAPHIC STUDIES: I have personally reviewed the radiological images as listed and agreed with the findings in the report. No results found.  ASSESSMENT & PLAN:   Carcinoma of upper-outer quadrant of right breast in female, estrogen receptor positive ([place]) # Stage I ER/PR-pos; Her 2 Neu amplified breast cancer.  Plan adjuvant chemo-taxol-Herceptin starting today.   # proceed with Taxol-herpcetin today. Labs today reviewed;  acceptable for treatment today.   #Again reviewed the schedule potential side effects in detail.  We will plan to add radiation after finishing Taxol.   #IV access discussed regarding placement patient interested.  Will make a referral  # DISPOSITION: # vascular referral-for port placement asap.  # Treatment today # in 1 week-cbc/bmp/taxol-herceptin # in 2 week-MD-cbc/cmp/taxol-herceptin  All questions were answered. The patient knows to call the clinic with any problems, questions or concerns.    Cammie Sickle, MD 05/20/2018 9:28 AM

## 2018-05-21 ENCOUNTER — Telehealth: Payer: Self-pay | Admitting: *Deleted

## 2018-05-21 NOTE — Telephone Encounter (Signed)
-----   Message from Cammie Sickle, MD sent at 05/21/2018 10:12 AM EST ----- Regarding: RE: port placement Contact: [phone] Hershall Benkert-please talk to patient regarding port placement.  I had a long discussion with her yesterday.  I told her it is not absolutely necessary to have the port for this kind of chemotherapy; however the fact that she will need treatment for almost a year-it is recommended she has a port.   Please check with the patient of what she wants. Thanks!!  ----- Message ----- From: Secundino Ginger Sent: 05/21/2018   8:41 AM EST To: Sabino Gasser, RN, Alric Quan, CMA, # Subject: port placement                                 Delissa called and is having second thoughts about getting a port. She said they called her form the office and gave her an appt but she deleted it and doesn't know where to call. She said DR B told her it wasn't necessary to have a port. Please call and talk to her about this and let her know where to call to speak to whomever was supposed to do the port.

## 2018-05-21 NOTE — Telephone Encounter (Signed)
Call returned. Explained to patient that Mickel Baas, from vein/vascular dept will be calling her to set up an apt for her port placement. I reiterated that since the treatment is weekly x 1 year, Dr. B asked her to consider the port. Although the port is not required for her chemotherapy. Pt thanked me for the information and I also gave her Vein/Vasculars office phone  # to return Laura's phone call. Patient will proceed with port placement.

## 2018-05-22 ENCOUNTER — Encounter (INDEPENDENT_AMBULATORY_CARE_PROVIDER_SITE_OTHER): Payer: Self-pay

## 2018-05-22 ENCOUNTER — Telehealth (INDEPENDENT_AMBULATORY_CARE_PROVIDER_SITE_OTHER): Payer: Self-pay

## 2018-05-22 NOTE — Telephone Encounter (Signed)
Spoke with the patient and gave her the pre-procedure instructions along with day/time and they will be mailed out to the patient as well.

## 2018-05-22 NOTE — Telephone Encounter (Signed)
Patient port placement is 05/26/18 with a 12:45pm arrival time.

## 2018-05-23 ENCOUNTER — Telehealth: Payer: Self-pay | Admitting: *Deleted

## 2018-05-23 MED ORDER — ONDANSETRON HCL 8 MG PO TABS: 8.0000 mg | ORAL_TABLET | Freq: Three times a day (TID) | ORAL | 3 refills | Status: DC | PRN

## 2018-05-23 MED ORDER — PROCHLORPERAZINE MALEATE 10 MG PO TABS: 10.0000 mg | ORAL_TABLET | Freq: Four times a day (QID) | ORAL | 3 refills | Status: DC | PRN

## 2018-05-23 NOTE — Telephone Encounter (Signed)
-----   Message from Secundino Ginger sent at 05/23/2018 12:05 PM EST ----- Regarding: NAUSEA MEDICATION Contact: [phone] THIS PT CALLED AND SAID SOMETHING WAS SUPPOSED TO BE SENT TO PHARMACY FOR NAUSEA. SHE CALLED AND IT IS NOT THERE. SHE ASKED FOR SOMEONE TO CALL HER BACK

## 2018-05-23 NOTE — Telephone Encounter (Signed)
Antiemetics sent to patient's preferred pharmacy

## 2018-05-26 ENCOUNTER — Ambulatory Visit
Admission: RE | Admit: 2018-05-26 | Discharge: 2018-05-26 | Disposition: A | Discharge status: Home / Self Care | Payer: Medicare HMO | Attending: Vascular Surgery | Admitting: Vascular Surgery

## 2018-05-26 ENCOUNTER — Other Ambulatory Visit: Payer: Self-pay

## 2018-05-26 ENCOUNTER — Encounter
Admission: RE | Disposition: A | Discharge status: Home / Self Care | Payer: Self-pay | Source: Home / Self Care | Attending: Vascular Surgery

## 2018-05-26 DIAGNOSIS — Z170 Estrogen receptor positive status [ER+]: Secondary | ICD-10-CM | POA: Diagnosis not present

## 2018-05-26 DIAGNOSIS — Z8041 Family history of malignant neoplasm of ovary: Secondary | ICD-10-CM | POA: Insufficient documentation

## 2018-05-26 DIAGNOSIS — Z808 Family history of malignant neoplasm of other organs or systems: Secondary | ICD-10-CM | POA: Insufficient documentation

## 2018-05-26 DIAGNOSIS — E785 Hyperlipidemia, unspecified: Secondary | ICD-10-CM | POA: Diagnosis not present

## 2018-05-26 DIAGNOSIS — C50919 Malignant neoplasm of unspecified site of unspecified female breast: Secondary | ICD-10-CM

## 2018-05-26 DIAGNOSIS — Z90710 Acquired absence of both cervix and uterus: Secondary | ICD-10-CM | POA: Insufficient documentation

## 2018-05-26 DIAGNOSIS — Z803 Family history of malignant neoplasm of breast: Secondary | ICD-10-CM | POA: Insufficient documentation

## 2018-05-26 DIAGNOSIS — C50411 Malignant neoplasm of upper-outer quadrant of right female breast: Secondary | ICD-10-CM | POA: Insufficient documentation

## 2018-05-26 DIAGNOSIS — Z823 Family history of stroke: Secondary | ICD-10-CM | POA: Insufficient documentation

## 2018-05-26 DIAGNOSIS — Z8042 Family history of malignant neoplasm of prostate: Secondary | ICD-10-CM | POA: Diagnosis not present

## 2018-05-26 DIAGNOSIS — Z8543 Personal history of malignant neoplasm of ovary: Secondary | ICD-10-CM | POA: Insufficient documentation

## 2018-05-26 DIAGNOSIS — I10 Essential (primary) hypertension: Secondary | ICD-10-CM | POA: Diagnosis not present

## 2018-05-26 DIAGNOSIS — Z8052 Family history of malignant neoplasm of bladder: Secondary | ICD-10-CM | POA: Insufficient documentation

## 2018-05-26 DIAGNOSIS — Z8249 Family history of ischemic heart disease and other diseases of the circulatory system: Secondary | ICD-10-CM | POA: Insufficient documentation

## 2018-05-26 HISTORY — PX: PORTA CATH INSERTION: CATH118285

## 2018-05-26 SURGERY — PORTA CATH INSERTION
Anesthesia: Moderate Sedation

## 2018-05-26 MED ORDER — FENTANYL CITRATE (PF) 100 MCG/2ML IJ SOLN
INTRAMUSCULAR | Status: DC | PRN
  Administered 2018-05-26: 50 ug via INTRAVENOUS

## 2018-05-26 MED ORDER — HYDROMORPHONE HCL 1 MG/ML IJ SOLN: 1.0000 mg | Freq: Once | INTRAMUSCULAR | Status: DC | PRN

## 2018-05-26 MED ORDER — CEFAZOLIN SODIUM-DEXTROSE 2-4 GM/100ML-% IV SOLN
2.0000 g | Freq: Once | INTRAVENOUS | Status: AC
  Administered 2018-05-26: 2 g via INTRAVENOUS

## 2018-05-26 MED ORDER — SODIUM CHLORIDE 0.9 % IV SOLN
Freq: Once | INTRAVENOUS | Status: AC
  Administered 2018-05-26: 15:00:00
  Filled 2018-05-26: qty 80

## 2018-05-26 MED ORDER — LIDOCAINE-PRILOCAINE 2.5-2.5 % EX CREA
TOPICAL_CREAM | CUTANEOUS | Status: AC
  Administered 2018-05-26: 16:00:00 via TOPICAL

## 2018-05-26 MED ORDER — HEPARIN (PORCINE) IN NACL 1000-0.9 UT/500ML-% IV SOLN
INTRAVENOUS | Status: AC
  Filled 2018-05-26: qty 500

## 2018-05-26 MED ORDER — LABETALOL HCL 5 MG/ML IV SOLN
INTRAVENOUS | Status: AC
  Filled 2018-05-26: qty 4

## 2018-05-26 MED ORDER — MIDAZOLAM HCL 2 MG/2ML IJ SOLN
INTRAMUSCULAR | Status: DC | PRN
  Administered 2018-05-26: 2 mg via INTRAVENOUS

## 2018-05-26 MED ORDER — FENTANYL CITRATE (PF) 100 MCG/2ML IJ SOLN
INTRAMUSCULAR | Status: AC
  Filled 2018-05-26: qty 2

## 2018-05-26 MED ORDER — MIDAZOLAM HCL 5 MG/5ML IJ SOLN
INTRAMUSCULAR | Status: AC
  Filled 2018-05-26: qty 5

## 2018-05-26 MED ORDER — ONDANSETRON HCL 4 MG/2ML IJ SOLN: 4.0000 mg | Freq: Four times a day (QID) | INTRAMUSCULAR | Status: DC | PRN

## 2018-05-26 MED ORDER — LIDOCAINE-PRILOCAINE 2.5-2.5 % EX CREA: TOPICAL_CREAM | CUTANEOUS | Status: DC | PRN

## 2018-05-26 MED ORDER — CEFAZOLIN SODIUM-DEXTROSE 2-4 GM/100ML-% IV SOLN
INTRAVENOUS | Status: AC
  Administered 2018-05-26: 2 g via INTRAVENOUS
  Filled 2018-05-26: qty 100

## 2018-05-26 MED ORDER — LIDOCAINE-EPINEPHRINE (PF) 1 %-1:200000 IJ SOLN
INTRAMUSCULAR | Status: AC
  Filled 2018-05-26: qty 20

## 2018-05-26 MED ORDER — LIDOCAINE-PRILOCAINE 2.5-2.5 % EX CREA
TOPICAL_CREAM | CUTANEOUS | Status: AC
  Filled 2018-05-26: qty 5

## 2018-05-26 MED ORDER — DEXTROSE 5 % IV SOLN: 2.0000 g | Freq: Once | INTRAVENOUS | Status: DC

## 2018-05-26 MED ORDER — LABETALOL HCL 5 MG/ML IV SOLN
INTRAVENOUS | Status: DC | PRN
  Administered 2018-05-26: 10 mg via INTRAVENOUS

## 2018-05-26 MED ORDER — SODIUM CHLORIDE 0.9 % IV SOLN
INTRAVENOUS | Status: DC
  Administered 2018-05-26: 13:00:00 via INTRAVENOUS

## 2018-05-26 SURGICAL SUPPLY — 10 items
DERMABOND ADVANCED (GAUZE/BANDAGES/DRESSINGS) ×1
DERMABOND ADVANCED .7 DNX12 (GAUZE/BANDAGES/DRESSINGS) ×1 IMPLANT
KIT PORT POWER 8FR ISP CVUE (Port) ×2 IMPLANT
PACK ANGIOGRAPHY (CUSTOM PROCEDURE TRAY) ×2 IMPLANT
PAD GROUND ADULT SPLIT (MISCELLANEOUS) ×2 IMPLANT
PENCIL ELECTRO HAND CTR (MISCELLANEOUS) ×2 IMPLANT
SUT MNCRL AB 4-0 PS2 18 (SUTURE) ×2 IMPLANT
SUT PROLENE 0 CT 1 30 (SUTURE) ×2 IMPLANT
SUT VIC AB 3-0 SH 27 (SUTURE) ×1
SUT VIC AB 3-0 SH 27X BRD (SUTURE) ×1 IMPLANT

## 2018-05-26 NOTE — H&P (Signed)
[facility] History & Physical Update  The patient was interviewed and re-examined.  The patient's previous History and Physical has been reviewed and is unchanged.  There is no change in the plan of care. We plan to proceed with the scheduled procedure.  Leotis Pain, MD  05/26/2018, 3:01 PM

## 2018-05-26 NOTE — Op Note (Signed)
      [facility]       Operative Note  Date: 05/26/2018  Preoperative diagnosis:  1. Breast cancer  Postoperative diagnosis:  Same as above  Procedures: #1. Ultrasound guidance for vascular access to the right internal jugular vein. #2. Fluoroscopic guidance for placement of catheter. #3. Placement of CT compatible Port-A-Cath, right internal jugular vein.  Surgeon: Leotis Pain, MD.   Anesthesia: Local with moderate conscious sedation for approximately 20  minutes using 2 mg of Versed and 50 mcg of Fentanyl  Fluoroscopy time: less than 1 minute  Contrast used: 0  Estimated blood loss: 5 cc  Indication for the procedure:  The patient is a 84 y.o.female with breast cancer.  The patient needs a Port-A-Cath for durable venous access, chemotherapy, lab draws, and CT scans. We are asked to place this. Risks and benefits were discussed and informed consent was obtained.  Description of procedure: The patient was brought to the vascular and interventional radiology suite.  Moderate conscious sedation was administered throughout the procedure during a face to face encounter with the patient with my supervision of the RN administering medicines and monitoring the patient's vital signs, pulse oximetry, telemetry and mental status throughout from the start of the procedure until the patient was taken to the recovery room. The right neck chest and shoulder were sterilely prepped and draped, and a sterile surgical field was created. Ultrasound was used to help visualize a patent right internal jugular vein. This was then accessed under direct ultrasound guidance without difficulty with the Seldinger needle and a permanent image was recorded. A J-wire was placed. After skin nick and dilatation, the peel-away sheath was then placed over the wire. I then anesthetized an area under the clavicle approximately 1-2 fingerbreadths. A transverse incision was created and an inferior pocket  was created with electrocautery and blunt dissection. The port was then brought onto the field, placed into the pocket and secured to the chest wall with 2 Prolene sutures. The catheter was connected to the port and tunneled from the subclavicular incision to the access site. Fluoroscopic guidance was then used to cut the catheter to an appropriate length. The catheter was then placed through the peel-away sheath and the peel-away sheath was removed. The catheter tip was parked in excellent location under fluorocoscopic guidance in the SVC just above the right atrium. The pocket was then irrigated with antibiotic impregnated saline and the wound was closed with a running 3-0 Vicryl and a 4-0 Monocryl. The access incision was closed with a single 4-0 Monocryl. The Huber needle was used to withdraw blood and flush the port with heparinized saline. Dermabond was then placed as a dressing. The patient tolerated the procedure well and was taken to the recovery room in stable condition.   Leotis Pain 05/26/2018 3:35 PM   This note was created with Dragon Medical transcription system. Any errors in dictation are purely unintentional.

## 2018-05-27 ENCOUNTER — Inpatient Hospital Stay: Payer: Medicare HMO

## 2018-05-27 ENCOUNTER — Telehealth: Payer: Self-pay | Admitting: *Deleted

## 2018-05-27 ENCOUNTER — Encounter: Payer: Self-pay | Admitting: Vascular Surgery

## 2018-05-27 ENCOUNTER — Other Ambulatory Visit (INDEPENDENT_AMBULATORY_CARE_PROVIDER_SITE_OTHER): Payer: Self-pay | Admitting: Nurse Practitioner

## 2018-05-27 VITALS — BP 164/83 | HR 80 | Resp 18

## 2018-05-27 DIAGNOSIS — Z170 Estrogen receptor positive status [ER+]: Secondary | ICD-10-CM

## 2018-05-27 DIAGNOSIS — C50411 Malignant neoplasm of upper-outer quadrant of right female breast: Secondary | ICD-10-CM

## 2018-05-27 DIAGNOSIS — Z5112 Encounter for antineoplastic immunotherapy: Secondary | ICD-10-CM | POA: Diagnosis not present

## 2018-05-27 DIAGNOSIS — Z95828 Presence of other vascular implants and grafts: Secondary | ICD-10-CM

## 2018-05-27 LAB — COMPREHENSIVE METABOLIC PANEL
ALT: 21 U/L (ref 0–44)
AST: 20 U/L (ref 15–41)
Albumin: 3.2 g/dL — ABNORMAL LOW (ref 3.5–5.0)
Alkaline Phosphatase: 85 U/L (ref 38–126)
Anion gap: 6 (ref 5–15)
BUN: 15 mg/dL (ref 8–23)
CO2: 27 mmol/L (ref 22–32)
Calcium: 8.6 mg/dL — ABNORMAL LOW (ref 8.9–10.3)
Chloride: 106 mmol/L (ref 98–111)
Creatinine, Ser: 0.55 mg/dL (ref 0.44–1.00)
GFR calc Af Amer: >60 mL/min (ref >60)
GFR calc non Af Amer: >60 mL/min (ref >60)
Glucose, Bld: 101 mg/dL — ABNORMAL HIGH (ref 70–99)
Potassium: 4.1 mmol/L (ref 3.5–5.1)
SODIUM: 139 mmol/L (ref 135–145)
Total Bilirubin: 0.6 mg/dL (ref 0.3–1.2)
Total Protein: 6.2 g/dL — ABNORMAL LOW (ref 6.5–8.1)

## 2018-05-27 LAB — CBC WITH DIFFERENTIAL/PLATELET
Abs Immature Granulocytes: 0.04 10*3/uL (ref 0.00–0.07)
Basophils Absolute: 0.1 10*3/uL (ref 0.0–0.1)
Basophils Relative: 1 %
Eosinophils Absolute: 0 10*3/uL (ref 0.0–0.5)
Eosinophils Relative: 1 %
HCT: 34.2 % — ABNORMAL LOW (ref 36.0–46.0)
Hemoglobin: 11.2 g/dL — ABNORMAL LOW (ref 12.0–15.0)
Immature Granulocytes: 1 %
Lymphocytes Relative: 21 %
Lymphs Abs: 1.6 10*3/uL (ref 0.7–4.0)
MCH: 31 pg (ref 26.0–34.0)
MCHC: 32.7 g/dL (ref 30.0–36.0)
MCV: 94.7 fL (ref 80.0–100.0)
Monocytes Absolute: 0.5 10*3/uL (ref 0.1–1.0)
Monocytes Relative: 7 %
Neutro Abs: 5.2 10*3/uL (ref 1.7–7.7)
Neutrophils Relative %: 69 %
Platelets: 274 10*3/uL (ref 150–400)
RBC: 3.61 MIL/uL — ABNORMAL LOW (ref 3.87–5.11)
RDW: 13.6 % (ref 11.5–15.5)
WBC: 7.4 10*3/uL (ref 4.0–10.5)
nRBC: 0 % (ref 0.0–0.2)

## 2018-05-27 MED ORDER — SODIUM CHLORIDE 0.9 % IV SOLN
20.0000 mg | Freq: Once | INTRAVENOUS | Status: AC
  Administered 2018-05-27: 20 mg via INTRAVENOUS
  Filled 2018-05-27: qty 2

## 2018-05-27 MED ORDER — SODIUM CHLORIDE 0.9 % IV SOLN
80.0000 mg/m2 | Freq: Once | INTRAVENOUS | Status: AC
  Administered 2018-05-27: 150 mg via INTRAVENOUS
  Filled 2018-05-27: qty 25

## 2018-05-27 MED ORDER — SODIUM CHLORIDE 0.9 % IV SOLN
Freq: Once | INTRAVENOUS | Status: AC
  Administered 2018-05-27: 10:00:00 via INTRAVENOUS
  Filled 2018-05-27: qty 250

## 2018-05-27 MED ORDER — SODIUM CHLORIDE 0.9% FLUSH
10.0000 mL | INTRAVENOUS | Status: DC | PRN
  Administered 2018-05-27: 10 mL via INTRAVENOUS
  Filled 2018-05-27: qty 10

## 2018-05-27 MED ORDER — DIPHENHYDRAMINE HCL 50 MG/ML IJ SOLN
50.0000 mg | Freq: Once | INTRAMUSCULAR | Status: AC
  Administered 2018-05-27: 50 mg via INTRAVENOUS
  Filled 2018-05-27: qty 1

## 2018-05-27 MED ORDER — ACETAMINOPHEN 325 MG PO TABS
650.0000 mg | ORAL_TABLET | Freq: Once | ORAL | Status: AC
  Administered 2018-05-27: 650 mg via ORAL
  Filled 2018-05-27: qty 2

## 2018-05-27 MED ORDER — LIDOCAINE-PRILOCAINE 2.5-2.5 % EX CREA: 1.0000 "application " | TOPICAL_CREAM | CUTANEOUS | 5 refills | Status: DC | PRN

## 2018-05-27 MED ORDER — TRASTUZUMAB CHEMO 150 MG IV SOLR
150.0000 mg | Freq: Once | INTRAVENOUS | Status: AC
  Administered 2018-05-27: 150 mg via INTRAVENOUS
  Filled 2018-05-27: qty 7.14

## 2018-05-27 MED ORDER — HEPARIN SOD (PORK) LOCK FLUSH 100 UNIT/ML IV SOLN
500.0000 [IU] | Freq: Once | INTRAVENOUS | Status: AC
  Administered 2018-05-27: 500 [IU] via INTRAVENOUS
  Filled 2018-05-27: qty 5

## 2018-05-27 MED ORDER — FAMOTIDINE IN NACL 20-0.9 MG/50ML-% IV SOLN
20.0000 mg | Freq: Once | INTRAVENOUS | Status: AC
  Administered 2018-05-27: 20 mg via INTRAVENOUS
  Filled 2018-05-27: qty 50

## 2018-05-27 NOTE — Patient Instructions (Signed)
Trastuzumab injection for infusion What is this medicine? TRASTUZUMAB (tras TOO zoo mab) is a monoclonal antibody. It is used to treat breast cancer and stomach cancer. This medicine may be used for other purposes; ask your health care provider or pharmacist if you have questions. COMMON BRAND NAME(S): Herceptin What should I tell my health care provider before I take this medicine? They need to know if you have any of these conditions: -heart disease -heart failure -lung or breathing disease, like asthma -an unusual or allergic reaction to trastuzumab, benzyl alcohol, or other medications, foods, dyes, or preservatives -pregnant or trying to get pregnant -breast-feeding How should I use this medicine? This drug is given as an infusion into a vein. It is administered in a hospital or clinic by a specially trained health care professional. Talk to your pediatrician regarding the use of this medicine in children. This medicine is not approved for use in children. Overdosage: If you think you have taken too much of this medicine contact a poison control center or emergency room at once. NOTE: This medicine is only for you. Do not share this medicine with others. What if I miss a dose? It is important not to miss a dose. Call your doctor or health care professional if you are unable to keep an appointment. What may interact with this medicine? This medicine may interact with the following medications: -certain types of chemotherapy, such as daunorubicin, doxorubicin, epirubicin, and idarubicin This list may not describe all possible interactions. Give your health care provider a list of all the medicines, herbs, non-prescription drugs, or dietary supplements you use. Also tell them if you smoke, drink alcohol, or use illegal drugs. Some items may interact with your medicine. What should I watch for while using this medicine? Visit your doctor for checks on your progress. Report any side effects.  Continue your course of treatment even though you feel ill unless your doctor tells you to stop. Call your doctor or health care professional for advice if you get a fever, chills or sore throat, or other symptoms of a cold or flu. Do not treat yourself. Try to avoid being around people who are sick. You may experience fever, chills and shaking during your first infusion. These effects are usually mild and can be treated with other medicines. Report any side effects during the infusion to your health care professional. Fever and chills usually do not happen with later infusions. Do not become pregnant while taking this medicine or for 7 months after stopping it. Women should inform their doctor if they wish to become pregnant or think they might be pregnant. Women of child-bearing potential will need to have a negative pregnancy test before starting this medicine. There is a potential for serious side effects to an unborn child. Talk to your health care professional or pharmacist for more information. Do not breast-feed an infant while taking this medicine or for 7 months after stopping it. Women must use effective birth control with this medicine. What side effects may I notice from receiving this medicine? Side effects that you should report to your doctor or health care professional as soon as possible: -allergic reactions like skin rash, itching or hives, swelling of the face, lips, or tongue -chest pain or palpitations -cough -dizziness -feeling faint or lightheaded, falls -fever -general ill feeling or flu-like symptoms -signs of worsening heart failure like breathing problems; swelling in your legs and feet -unusually weak or tired Side effects that usually do not require medical   attention (report to your doctor or health care professional if they continue or are bothersome): -bone pain -changes in taste -diarrhea -joint pain -nausea/vomiting -weight loss This list may not describe all  possible side effects. Call your doctor for medical advice about side effects. You may report side effects to FDA at 1-800-FDA-1088. Where should I keep my medicine? This drug is given in a hospital or clinic and will not be stored at home. NOTE: This sheet is a summary. It may not cover all possible information. If you have questions about this medicine, talk to your doctor, pharmacist, or health care provider.  2018 Elsevier/Gold Standard (2016-05-22 14:37:52) Paclitaxel injection What is this medicine? PACLITAXEL (PAK li TAX el) is a chemotherapy drug. It targets fast dividing cells, like cancer cells, and causes these cells to die. This medicine is used to treat ovarian cancer, breast cancer, and other cancers. This medicine may be used for other purposes; ask your health care provider or pharmacist if you have questions. COMMON BRAND NAME(S): Onxol, Taxol What should I tell my health care provider before I take this medicine? They need to know if you have any of these conditions: -blood disorders -irregular heartbeat -infection (especially a virus infection such as chickenpox, cold sores, or herpes) -liver disease -previous or ongoing radiation therapy -an unusual or allergic reaction to paclitaxel, alcohol, polyoxyethylated castor oil, other chemotherapy agents, other medicines, foods, dyes, or preservatives -pregnant or trying to get pregnant -breast-feeding How should I use this medicine? This drug is given as an infusion into a vein. It is administered in a hospital or clinic by a specially trained health care professional. Talk to your pediatrician regarding the use of this medicine in children. Special care may be needed. Overdosage: If you think you have taken too much of this medicine contact a poison control center or emergency room at once. NOTE: This medicine is only for you. Do not share this medicine with others. What if I miss a dose? It is important not to miss your  dose. Call your doctor or health care professional if you are unable to keep an appointment. What may interact with this medicine? Do not take this medicine with any of the following medications: -disulfiram -metronidazole This medicine may also interact with the following medications: -cyclosporine -diazepam -ketoconazole -medicines to increase blood counts like filgrastim, pegfilgrastim, sargramostim -other chemotherapy drugs like cisplatin, doxorubicin, epirubicin, etoposide, teniposide, vincristine -quinidine -testosterone -vaccines -verapamil Talk to your doctor or health care professional before taking any of these medicines: -acetaminophen -aspirin -ibuprofen -ketoprofen -naproxen This list may not describe all possible interactions. Give your health care provider a list of all the medicines, herbs, non-prescription drugs, or dietary supplements you use. Also tell them if you smoke, drink alcohol, or use illegal drugs. Some items may interact with your medicine. What should I watch for while using this medicine? Your condition will be monitored carefully while you are receiving this medicine. You will need important blood work done while you are taking this medicine. This medicine can cause serious allergic reactions. To reduce your risk you will need to take other medicine(s) before treatment with this medicine. If you experience allergic reactions like skin rash, itching or hives, swelling of the face, lips, or tongue, tell your doctor or health care professional right away. In some cases, you may be given additional medicines to help with side effects. Follow all directions for their use. This drug may make you feel generally unwell. This is not uncommon,   as chemotherapy can affect healthy cells as well as cancer cells. Report any side effects. Continue your course of treatment even though you feel ill unless your doctor tells you to stop. Call your doctor or health care  professional for advice if you get a fever, chills or sore throat, or other symptoms of a cold or flu. Do not treat yourself. This drug decreases your body's ability to fight infections. Try to avoid being around people who are sick. This medicine may increase your risk to bruise or bleed. Call your doctor or health care professional if you notice any unusual bleeding. Be careful brushing and flossing your teeth or using a toothpick because you may get an infection or bleed more easily. If you have any dental work done, tell your dentist you are receiving this medicine. Avoid taking products that contain aspirin, acetaminophen, ibuprofen, naproxen, or ketoprofen unless instructed by your doctor. These medicines may hide a fever. Do not become pregnant while taking this medicine. Women should inform their doctor if they wish to become pregnant or think they might be pregnant. There is a potential for serious side effects to an unborn child. Talk to your health care professional or pharmacist for more information. Do not breast-feed an infant while taking this medicine. Men are advised not to father a child while receiving this medicine. This product may contain alcohol. Ask your pharmacist or healthcare provider if this medicine contains alcohol. Be sure to tell all healthcare providers you are taking this medicine. Certain medicines, like metronidazole and disulfiram, can cause an unpleasant reaction when taken with alcohol. The reaction includes flushing, headache, nausea, vomiting, sweating, and increased thirst. The reaction can last from 30 minutes to several hours. What side effects may I notice from receiving this medicine? Side effects that you should report to your doctor or health care professional as soon as possible: -allergic reactions like skin rash, itching or hives, swelling of the face, lips, or tongue -low blood counts - This drug may decrease the number of white blood cells, red blood  cells and platelets. You may be at increased risk for infections and bleeding. -signs of infection - fever or chills, cough, sore throat, pain or difficulty passing urine -signs of decreased platelets or bleeding - bruising, pinpoint red spots on the skin, black, tarry stools, nosebleeds -signs of decreased red blood cells - unusually weak or tired, fainting spells, lightheadedness -breathing problems -chest pain -high or low blood pressure -mouth sores -nausea and vomiting -pain, swelling, redness or irritation at the injection site -pain, tingling, numbness in the hands or feet -slow or irregular heartbeat -swelling of the ankle, feet, hands Side effects that usually do not require medical attention (report to your doctor or health care professional if they continue or are bothersome): -bone pain -complete hair loss including hair on your head, underarms, pubic hair, eyebrows, and eyelashes -changes in the color of fingernails -diarrhea -loosening of the fingernails -loss of appetite -muscle or joint pain -red flush to skin -sweating This list may not describe all possible side effects. Call your doctor for medical advice about side effects. You may report side effects to FDA at 1-800-FDA-1088. Where should I keep my medicine? This drug is given in a hospital or clinic and will not be stored at home. NOTE: This sheet is a summary. It may not cover all possible information. If you have questions about this medicine, talk to your doctor, pharmacist, or health care provider.  2018 Elsevier/Gold Standard (2015-03-29   19:58:00)  

## 2018-05-27 NOTE — Telephone Encounter (Signed)
Robbi Garter Key: [id number] - PA Case ID: [id number] - Rx #: [id number]   PA initiated for EMLA cream- pending insurance approval

## 2018-05-28 NOTE — Telephone Encounter (Signed)
Lidocaine-Prilocaine 2.5-2.5% cream Status: PA Response - Approved

## 2018-06-02 ENCOUNTER — Inpatient Hospital Stay: Payer: Medicare HMO

## 2018-06-02 DIAGNOSIS — C50411 Malignant neoplasm of upper-outer quadrant of right female breast: Secondary | ICD-10-CM

## 2018-06-02 DIAGNOSIS — Z170 Estrogen receptor positive status [ER+]: Principal | ICD-10-CM

## 2018-06-02 DIAGNOSIS — Z5112 Encounter for antineoplastic immunotherapy: Secondary | ICD-10-CM | POA: Diagnosis not present

## 2018-06-02 LAB — COMPREHENSIVE METABOLIC PANEL
ALT: 19 U/L (ref 0–44)
AST: 17 U/L (ref 15–41)
Albumin: 3.5 g/dL (ref 3.5–5.0)
Alkaline Phosphatase: 80 U/L (ref 38–126)
Anion gap: 7 (ref 5–15)
BILIRUBIN TOTAL: 0.6 mg/dL (ref 0.3–1.2)
BUN: 14 mg/dL (ref 8–23)
CO2: 25 mmol/L (ref 22–32)
Calcium: 8.7 mg/dL — ABNORMAL LOW (ref 8.9–10.3)
Chloride: 104 mmol/L (ref 98–111)
Creatinine, Ser: 0.54 mg/dL (ref 0.44–1.00)
GFR calc Af Amer: >60 mL/min (ref >60)
GFR calc non Af Amer: >60 mL/min (ref >60)
Glucose, Bld: 91 mg/dL (ref 70–99)
POTASSIUM: 4.1 mmol/L (ref 3.5–5.1)
Sodium: 136 mmol/L (ref 135–145)
Total Protein: 6.4 g/dL — ABNORMAL LOW (ref 6.5–8.1)

## 2018-06-02 LAB — CBC WITH DIFFERENTIAL/PLATELET
ABS IMMATURE GRANULOCYTES: 0.05 10*3/uL (ref 0.00–0.07)
Basophils Absolute: 0 10*3/uL (ref 0.0–0.1)
Basophils Relative: 1 %
Eosinophils Absolute: 0.1 10*3/uL (ref 0.0–0.5)
Eosinophils Relative: 1 %
HCT: 36.2 % (ref 36.0–46.0)
Hemoglobin: 11.7 g/dL — ABNORMAL LOW (ref 12.0–15.0)
Immature Granulocytes: 1 %
Lymphocytes Relative: 28 %
Lymphs Abs: 1.9 10*3/uL (ref 0.7–4.0)
MCH: 30.8 pg (ref 26.0–34.0)
MCHC: 32.3 g/dL (ref 30.0–36.0)
MCV: 95.3 fL (ref 80.0–100.0)
Monocytes Absolute: 0.3 10*3/uL (ref 0.1–1.0)
Monocytes Relative: 5 %
Neutro Abs: 4.3 10*3/uL (ref 1.7–7.7)
Neutrophils Relative %: 64 %
Platelets: 289 10*3/uL (ref 150–400)
RBC: 3.8 MIL/uL — ABNORMAL LOW (ref 3.87–5.11)
RDW: 14 % (ref 11.5–15.5)
WBC: 6.7 10*3/uL (ref 4.0–10.5)
nRBC: 0 % (ref 0.0–0.2)

## 2018-06-02 MED ORDER — SODIUM CHLORIDE 0.9% FLUSH
10.0000 mL | INTRAVENOUS | Status: DC | PRN
  Administered 2018-06-02: 10 mL via INTRAVENOUS
  Filled 2018-06-02: qty 10

## 2018-06-02 MED ORDER — HEPARIN SOD (PORK) LOCK FLUSH 100 UNIT/ML IV SOLN
500.0000 [IU] | Freq: Once | INTRAVENOUS | Status: AC
  Administered 2018-06-02: 500 [IU] via INTRAVENOUS

## 2018-06-03 ENCOUNTER — Other Ambulatory Visit: Payer: Medicare HMO

## 2018-06-03 ENCOUNTER — Encounter: Payer: Self-pay | Admitting: Internal Medicine

## 2018-06-03 ENCOUNTER — Inpatient Hospital Stay (HOSPITAL_BASED_OUTPATIENT_CLINIC_OR_DEPARTMENT_OTHER): Payer: Medicare HMO | Admitting: Internal Medicine

## 2018-06-03 ENCOUNTER — Inpatient Hospital Stay: Payer: Medicare HMO

## 2018-06-03 VITALS — BP 168/82 | HR 72 | Resp 16

## 2018-06-03 VITALS — BP 155/76 | HR 58 | Temp 97.6°F | Resp 16 | Wt 169.6 lb

## 2018-06-03 DIAGNOSIS — Z5112 Encounter for antineoplastic immunotherapy: Secondary | ICD-10-CM | POA: Diagnosis not present

## 2018-06-03 DIAGNOSIS — C50411 Malignant neoplasm of upper-outer quadrant of right female breast: Secondary | ICD-10-CM | POA: Diagnosis not present

## 2018-06-03 DIAGNOSIS — G629 Polyneuropathy, unspecified: Secondary | ICD-10-CM | POA: Diagnosis not present

## 2018-06-03 DIAGNOSIS — Z8543 Personal history of malignant neoplasm of ovary: Secondary | ICD-10-CM | POA: Diagnosis not present

## 2018-06-03 DIAGNOSIS — Z170 Estrogen receptor positive status [ER+]: Secondary | ICD-10-CM

## 2018-06-03 MED ORDER — ACETAMINOPHEN 325 MG PO TABS
650.0000 mg | ORAL_TABLET | Freq: Once | ORAL | Status: AC
  Administered 2018-06-03: 650 mg via ORAL

## 2018-06-03 MED ORDER — FAMOTIDINE IN NACL 20-0.9 MG/50ML-% IV SOLN
20.0000 mg | Freq: Once | INTRAVENOUS | Status: DC
  Administered 2018-06-03: 20 mg via INTRAVENOUS

## 2018-06-03 MED ORDER — HEPARIN SOD (PORK) LOCK FLUSH 100 UNIT/ML IV SOLN
500.0000 [IU] | Freq: Once | INTRAVENOUS | Status: AC | PRN
  Administered 2018-06-03: 500 [IU]

## 2018-06-03 MED ORDER — SODIUM CHLORIDE 0.9% FLUSH
10.0000 mL | INTRAVENOUS | Status: DC | PRN
  Administered 2018-06-03: 10 mL
  Filled 2018-06-03: qty 10

## 2018-06-03 MED ORDER — DIPHENHYDRAMINE HCL 50 MG/ML IJ SOLN
50.0000 mg | Freq: Once | INTRAMUSCULAR | Status: AC
  Administered 2018-06-03: 50 mg via INTRAVENOUS

## 2018-06-03 MED ORDER — FAMOTIDINE IN NACL 20-0.9 MG/50ML-% IV SOLN
INTRAVENOUS | Status: AC
  Filled 2018-06-03: qty 50

## 2018-06-03 MED ORDER — ACETAMINOPHEN 325 MG PO TABS
ORAL_TABLET | ORAL | Status: AC
  Filled 2018-06-03: qty 2

## 2018-06-03 MED ORDER — TRASTUZUMAB CHEMO 150 MG IV SOLR
150.0000 mg | Freq: Once | INTRAVENOUS | Status: AC
  Administered 2018-06-03: 150 mg via INTRAVENOUS
  Filled 2018-06-03: qty 7.14

## 2018-06-03 MED ORDER — DIPHENHYDRAMINE HCL 50 MG/ML IJ SOLN
INTRAMUSCULAR | Status: AC
  Filled 2018-06-03: qty 1

## 2018-06-03 MED ORDER — SODIUM CHLORIDE 0.9 % IV SOLN
20.0000 mg | Freq: Once | INTRAVENOUS | Status: AC
  Administered 2018-06-03: 20 mg via INTRAVENOUS
  Filled 2018-06-03: qty 2

## 2018-06-03 MED ORDER — SODIUM CHLORIDE 0.9 % IV SOLN
Freq: Once | INTRAVENOUS | Status: AC
  Administered 2018-06-03: 09:00:00 via INTRAVENOUS
  Filled 2018-06-03: qty 250

## 2018-06-03 MED ORDER — SODIUM CHLORIDE 0.9 % IV SOLN
80.0000 mg/m2 | Freq: Once | INTRAVENOUS | Status: AC
  Administered 2018-06-03: 150 mg via INTRAVENOUS
  Filled 2018-06-03: qty 25

## 2018-06-03 NOTE — Patient Instructions (Signed)
Trastuzumab injection for infusion What is this medicine? TRASTUZUMAB (tras TOO zoo mab) is a monoclonal antibody. It is used to treat breast cancer and stomach cancer. This medicine may be used for other purposes; ask your health care provider or pharmacist if you have questions. COMMON BRAND NAME(S): Herceptin, KANJINTI, OGIVRI What should I tell my health care provider before I take this medicine? They need to know if you have any of these conditions: -heart disease -heart failure -lung or breathing disease, like asthma -an unusual or allergic reaction to trastuzumab, benzyl alcohol, or other medications, foods, dyes, or preservatives -pregnant or trying to get pregnant -breast-feeding How should I use this medicine? This drug is given as an infusion into a vein. It is administered in a hospital or clinic by a specially trained health care professional. Talk to your pediatrician regarding the use of this medicine in children. This medicine is not approved for use in children. Overdosage: If you think you have taken too much of this medicine contact a poison control center or emergency room at once. NOTE: This medicine is only for you. Do not share this medicine with others. What if I miss a dose? It is important not to miss a dose. Call your doctor or health care professional if you are unable to keep an appointment. What may interact with this medicine? This medicine may interact with the following medications: -certain types of chemotherapy, such as daunorubicin, doxorubicin, epirubicin, and idarubicin This list may not describe all possible interactions. Give your health care provider a list of all the medicines, herbs, non-prescription drugs, or dietary supplements you use. Also tell them if you smoke, drink alcohol, or use illegal drugs. Some items may interact with your medicine. What should I watch for while using this medicine? Visit your doctor for checks on your progress. Report  any side effects. Continue your course of treatment even though you feel ill unless your doctor tells you to stop. Call your doctor or health care professional for advice if you get a fever, chills or sore throat, or other symptoms of a cold or flu. Do not treat yourself. Try to avoid being around people who are sick. You may experience fever, chills and shaking during your first infusion. These effects are usually mild and can be treated with other medicines. Report any side effects during the infusion to your health care professional. Fever and chills usually do not happen with later infusions. Do not become pregnant while taking this medicine or for 7 months after stopping it. Women should inform their doctor if they wish to become pregnant or think they might be pregnant. Women of child-bearing potential will need to have a negative pregnancy test before starting this medicine. There is a potential for serious side effects to an unborn child. Talk to your health care professional or pharmacist for more information. Do not breast-feed an infant while taking this medicine or for 7 months after stopping it. Women must use effective birth control with this medicine. What side effects may I notice from receiving this medicine? Side effects that you should report to your doctor or health care professional as soon as possible: -allergic reactions like skin rash, itching or hives, swelling of the face, lips, or tongue -chest pain or palpitations -cough -dizziness -feeling faint or lightheaded, falls -fever -general ill feeling or flu-like symptoms -signs of worsening heart failure like breathing problems; swelling in your legs and feet -unusually weak or tired Side effects that usually do not   require medical attention (report to your doctor or health care professional if they continue or are bothersome): -bone pain -changes in taste -diarrhea -joint pain -nausea/vomiting -weight loss This list may  not describe all possible side effects. Call your doctor for medical advice about side effects. You may report side effects to FDA at 1-800-FDA-1088. Where should I keep my medicine? This drug is given in a hospital or clinic and will not be stored at home. NOTE: This sheet is a summary. It may not cover all possible information. If you have questions about this medicine, talk to your doctor, pharmacist, or health care provider.  2019 Elsevier/Gold Standard (2016-05-22 14:37:52)     Paclitaxel injection What is this medicine? PACLITAXEL (PAK li TAX el) is a chemotherapy drug. It targets fast dividing cells, like cancer cells, and causes these cells to die. This medicine is used to treat ovarian cancer, breast cancer, lung cancer, Kaposi's sarcoma, and other cancers. This medicine may be used for other purposes; ask your health care provider or pharmacist if you have questions. COMMON BRAND NAME(S): Onxol, Taxol What should I tell my health care provider before I take this medicine? They need to know if you have any of these conditions: -history of irregular heartbeat -liver disease -low blood counts, like low white cell, platelet, or red cell counts -lung or breathing disease, like asthma -tingling of the fingers or toes, or other nerve disorder -an unusual or allergic reaction to paclitaxel, alcohol, polyoxyethylated castor oil, other chemotherapy, other medicines, foods, dyes, or preservatives -pregnant or trying to get pregnant -breast-feeding How should I use this medicine? This drug is given as an infusion into a vein. It is administered in a hospital or clinic by a specially trained health care professional. Talk to your pediatrician regarding the use of this medicine in children. Special care may be needed. Overdosage: If you think you have taken too much of this medicine contact a poison control center or emergency room at once. NOTE: This medicine is only for you. Do not share  this medicine with others. What if I miss a dose? It is important not to miss your dose. Call your doctor or health care professional if you are unable to keep an appointment. What may interact with this medicine? Do not take this medicine with any of the following medications: -disulfiram -metronidazole This medicine may also interact with the following medications: -antiviral medicines for hepatitis, HIV or AIDS -certain antibiotics like erythromycin and clarithromycin -certain medicines for fungal infections like ketoconazole and itraconazole -certain medicines for seizures like carbamazepine, phenobarbital, phenytoin -gemfibrozil -nefazodone -rifampin -St. John's wort This list may not describe all possible interactions. Give your health care provider a list of all the medicines, herbs, non-prescription drugs, or dietary supplements you use. Also tell them if you smoke, drink alcohol, or use illegal drugs. Some items may interact with your medicine. What should I watch for while using this medicine? Your condition will be monitored carefully while you are receiving this medicine. You will need important blood work done while you are taking this medicine. This medicine can cause serious allergic reactions. To reduce your risk you will need to take other medicine(s) before treatment with this medicine. If you experience allergic reactions like skin rash, itching or hives, swelling of the face, lips, or tongue, tell your doctor or health care professional right away. In some cases, you may be given additional medicines to help with side effects. Follow all directions for their use. This   drug may make you feel generally unwell. This is not uncommon, as chemotherapy can affect healthy cells as well as cancer cells. Report any side effects. Continue your course of treatment even though you feel ill unless your doctor tells you to stop. Call your doctor or health care professional for advice if  you get a fever, chills or sore throat, or other symptoms of a cold or flu. Do not treat yourself. This drug decreases your body's ability to fight infections. Try to avoid being around people who are sick. This medicine may increase your risk to bruise or bleed. Call your doctor or health care professional if you notice any unusual bleeding. Be careful brushing and flossing your teeth or using a toothpick because you may get an infection or bleed more easily. If you have any dental work done, tell your dentist you are receiving this medicine. Avoid taking products that contain aspirin, acetaminophen, ibuprofen, naproxen, or ketoprofen unless instructed by your doctor. These medicines may hide a fever. Do not become pregnant while taking this medicine. Women should inform their doctor if they wish to become pregnant or think they might be pregnant. There is a potential for serious side effects to an unborn child. Talk to your health care professional or pharmacist for more information. Do not breast-feed an infant while taking this medicine. Men are advised not to father a child while receiving this medicine. This product may contain alcohol. Ask your pharmacist or healthcare provider if this medicine contains alcohol. Be sure to tell all healthcare providers you are taking this medicine. Certain medicines, like metronidazole and disulfiram, can cause an unpleasant reaction when taken with alcohol. The reaction includes flushing, headache, nausea, vomiting, sweating, and increased thirst. The reaction can last from 30 minutes to several hours. What side effects may I notice from receiving this medicine? Side effects that you should report to your doctor or health care professional as soon as possible: -allergic reactions like skin rash, itching or hives, swelling of the face, lips, or tongue -breathing problems -changes in vision -fast, irregular heartbeat -high or low blood pressure -mouth  sores -pain, tingling, numbness in the hands or feet -signs of decreased platelets or bleeding - bruising, pinpoint red spots on the skin, black, tarry stools, blood in the urine -signs of decreased red blood cells - unusually weak or tired, feeling faint or lightheaded, falls -signs of infection - fever or chills, cough, sore throat, pain or difficulty passing urine -signs and symptoms of liver injury like dark yellow or brown urine; general ill feeling or flu-like symptoms; light-colored stools; loss of appetite; nausea; right upper belly pain; unusually weak or tired; yellowing of the eyes or skin -swelling of the ankles, feet, hands -unusually slow heartbeat Side effects that usually do not require medical attention (report to your doctor or health care professional if they continue or are bothersome): -diarrhea -hair loss -loss of appetite -muscle or joint pain -nausea, vomiting -pain, redness, or irritation at site where injected -tiredness This list may not describe all possible side effects. Call your doctor for medical advice about side effects. You may report side effects to FDA at 1-800-FDA-1088. Where should I keep my medicine? This drug is given in a hospital or clinic and will not be stored at home. NOTE: This sheet is a summary. It may not cover all possible information. If you have questions about this medicine, talk to your doctor, pharmacist, or health care provider.  2019 Elsevier/Gold Standard (2017-01-29 13:14:55)  

## 2018-06-03 NOTE — Progress Notes (Signed)
[place] CONSULT NOTE  Patient Care Team: Einar Pheasant, MD as PCP - General (Internal Medicine) Caryl Bis Angela Adam, MD as Consulting Physician (Family Medicine) Christene Lye, MD (General Surgery) Einar Pheasant, MD (Internal Medicine) Bary Castilla, Forest Gleason, MD (General Surgery) Schnier, Dolores Lory, MD as Consulting Physician (Vascular Surgery)  CHIEF COMPLAINTS/PURPOSE OF CONSULTATION:    #  Oncology History   # OCT 2019- RUOQ IMC-stage I [lumpectomy sentinel lymph node biopsy [facility]; T=0.6 cm; N=0] ER/PR-POSITIVE;her 2 neu-AMPLIFIED [2.28]  # Dec 10th 2019- Taxol-Herceptin weekly x12.   # Oct 2nd EF-55-60% [[facility]]  # Ovarian cancer 2002; s/p carboTaxol x6 [[place]];  Ovarian cancer treated in 2003, she underwent exploratory laparotomy, TAH/BSO, omentectomy, peritoneal biopsies, selective pelvic and periaortic lymph node sampling in [place] for a Grade 1 papillary serous carcinoma of the ovary on June 20, 2001. Last tine she saw GYN oncology was 2011 (Dr. Erasmo Leventhal)  # Genetic testing? [[facility]]  DIAGNOSIS: Breast cancer  STAGE:  I  ;GOALS: cure  CURRENT/MOST RECENT THERAPY: Taxol+herceptin      Carcinoma of upper-outer quadrant of right breast in female, estrogen receptor positive ([place])      HISTORY OF PRESENTING ILLNESS:  [name] [name] 84 y.o.  female with stage I ER PR positive HER-2/neu amplified breast cancer is here for follow-up/currently on Taxol Herceptin weekly.  Patient did not have any acute infusion reactions.  Tolerated chemotherapy fairly well.  Complains of mild numbness in the feet intermittently.  Current resolved.  Denies any nausea vomiting.  Denies any significant hair loss.   Review of Systems  Constitutional: Negative for chills, diaphoresis, fever, malaise/fatigue and weight loss.  HENT: Negative for nosebleeds and sore throat.   Eyes: Negative for double vision.  Respiratory: Negative for cough, hemoptysis,  sputum production, shortness of breath and wheezing.   Cardiovascular: Negative for chest pain, palpitations, orthopnea and leg swelling.  Gastrointestinal: Negative for abdominal pain, blood in stool, constipation, diarrhea, heartburn, melena, nausea and vomiting.  Genitourinary: Negative for dysuria, frequency and urgency.  Musculoskeletal: Negative for back pain and joint pain.  Skin: Negative.  Negative for itching and rash.  Neurological: Negative for dizziness, focal weakness, weakness and headaches.       Mild numbness in the feet.  Endo/Heme/Allergies: Does not bruise/bleed easily.  Psychiatric/Behavioral: Negative for depression. The patient is not nervous/anxious and does not have insomnia.      MEDICAL HISTORY:  Past Medical History:  Diagnosis Date  . Diverticulitis of sigmoid colon   . Endometriosis   . Fibrocystic breast disease   . Glaucoma   . H/O urinary incontinence   . History of chicken pox   . History of colon polyps 2014  . Hyperlipidemia   . Hypertension   . Ovarian cancer ([place]) 2002   Chemo tx's @ [facility] with Total Hysterectomy. Dr Amalia Hailey  . Personal history of chemotherapy    ovarian cancer  . Port-A-Cath in place   . Sinusitis   . Tuberculosis    positve skin test    SURGICAL HISTORY: Past Surgical History:  Procedure Laterality Date  . ABDOMINAL HYSTERECTOMY  2003   complete Dr Amalia Hailey  . ABSCESS DRAINAGE  2017   right buttock at [facility]  . APPENDECTOMY  2003  . BREAST EXCISIONAL BIOPSY Left 1993   excisional bx. negative results  . BREAST SURGERY Left 1982   papilloma removal  . COLONOSCOPY  2006, 2014   Dr Vira Agar   . COLONOSCOPY N/A 03/27/2016  Procedure: COLONOSCOPY;  Surgeon: Robert Bellow, MD;  Location: [facility] ENDOSCOPY;  Service: Endoscopy;  Laterality: N/A;  . COLOSTOMY REVERSAL  2014   at [place] Dr Raynaldo Opitz  . NASAL SINUS SURGERY     For fungal infection  . NASAL SINUS SURGERY  8/05 8/06  . OOPHORECTOMY    . OSTOMY  05/22/2012    diverticulitis with abcess/ Dr Tamala Julian  . ovarian cancer     Exploratory lap  . PORTA CATH INSERTION N/A 05/26/2018   Procedure: PORTA CATH INSERTION;  Surgeon: Algernon Huxley, MD;  Location: [facility] CV LAB;  Service: Cardiovascular;  Laterality: N/A;  . TONSILLECTOMY AND ADENOIDECTOMY  1947    SOCIAL HISTORY: Social History   Socioeconomic History  . Marital status: Widowed    Spouse name: Not on file  . Number of children: 0  . Years of education: Not on file  . Highest education level: Not on file  Occupational History  . Not on file  Social Needs  . Financial resource strain: Not hard at all  . Food insecurity:    Worry: Never true    Inability: Never true  . Transportation needs:    Medical: No    Non-medical: No  Tobacco Use  . Smoking status: Never Smoker  . Smokeless tobacco: Never Used  Substance and Sexual Activity  . Alcohol use: No    Alcohol/week: 0.0 standard drinks  . Drug use: No  . Sexual activity: Not Currently  Lifestyle  . Physical activity:    Days per week: Not on file    Minutes per session: Not on file  . Stress: Not on file  Relationships  . Social connections:    Talks on phone: Not on file    Gets together: Not on file    Attends religious service: Not on file    Active member of club or organization: Not on file    Attends meetings of clubs or organizations: Not on file    Relationship status: Not on file  . Intimate partner violence:    Fear of current or ex partner: Not on file    Emotionally abused: Not on file    Physically abused: Not on file    Forced sexual activity: Not on file  Other Topics Concern  . Not on file  Social History Narrative  . Not on file    FAMILY HISTORY: Family History  Problem Relation Age of Onset  . Hypertension Mother   . Coronary artery disease Mother   . Heart disease Mother   . Kidney disease Mother   . Diabetes Mother   . Cancer Mother        uterine cancer  . Diabetes Father   .  Kidney failure Father   . Diabetes Brother   . Coronary artery disease Brother   . Cancer Brother        bladder cancer  . Stroke Sister   . Heart disease Sister        [facility]  . Cancer Maternal Aunt        ovarian cancer  . Breast cancer Cousin   . Cancer Brother        Prostate  . Breast cancer Cousin   . Breast cancer Cousin     ALLERGIES:  has No Known Allergies.  MEDICATIONS:  Current Outpatient Medications  Medication Sig Dispense Refill  . Calcium Carbonate-Vitamin D 600-400 MG-UNIT tablet Take by mouth.    Marland Kitchen  lidocaine-prilocaine (EMLA) cream Apply 1 application topically as needed. 30 g 5  . ondansetron (ZOFRAN) 8 MG tablet Take 1 tablet (8 mg total) by mouth every 8 (eight) hours as needed for nausea or vomiting. 20 tablet 3  . prochlorperazine (COMPAZINE) 10 MG tablet Take 1 tablet (10 mg total) by mouth every 6 (six) hours as needed for nausea or vomiting. 30 tablet 3   Current Facility-Administered Medications  Medication Dose Route Frequency Provider Last Rate Last Dose  . lidocaine-prilocaine (EMLA) cream   Topical PRN Lucky Cowboy, Erskine Squibb, MD       Facility-Administered Medications Ordered in Other Visits  Medication Dose Route Frequency Provider Last Rate Last Dose  . acetaminophen (TYLENOL) tablet 650 mg  650 mg Oral Once Charlaine Dalton R, MD      . dexamethasone (DECADRON) 20 mg in sodium chloride 0.9 % 50 mL IVPB  20 mg Intravenous Once Charlaine Dalton R, MD      . diphenhydrAMINE (BENADRYL) injection 50 mg  50 mg Intravenous Once Charlaine Dalton R, MD      . famotidine (PEPCID) IVPB 20 mg premix  20 mg Intravenous Once Charlaine Dalton R, MD      . heparin lock flush 100 unit/mL  500 Units Intracatheter Once PRN Cammie Sickle, MD      . PACLitaxel (TAXOL) 150 mg in sodium chloride 0.9 % 250 mL chemo infusion (</= '80mg'$ /m2)  80 mg/m2 (Order-Specific) Intravenous Once Charlaine Dalton R, MD      . sodium chloride flush (NS) 0.9 %  injection 10 mL  10 mL Intracatheter PRN Cammie Sickle, MD   10 mL at 06/03/18 0841  . trastuzumab (HERCEPTIN) 150 mg in sodium chloride 0.9 % 250 mL chemo infusion  150 mg Intravenous Once Charlaine Dalton R, MD          .  PHYSICAL EXAMINATION: ECOG PERFORMANCE STATUS: 0 - Asymptomatic  Vitals:   06/03/18 0813  BP: (!) 155/76  Pulse: (!) 58  Resp: 16  Temp: 97.6 F (36.4 C)   Filed Weights   06/03/18 0813  Weight: 169 lb 10.3 oz (76.9 kg)    Physical Exam  Constitutional: She is oriented to person, place, and time and well-developed, well-nourished, and in no distress.  She is alone.  HENT:  Head: Normocephalic and atraumatic.  Mouth/Throat: Oropharynx is clear and moist. No oropharyngeal exudate.  Eyes: Pupils are equal, round, and reactive to light.  Neck: Normal range of motion. Neck supple.  Cardiovascular: Normal rate and regular rhythm.  Pulmonary/Chest: No respiratory distress. She has no wheezes.  Abdominal: Soft. Bowel sounds are normal. She exhibits no distension and no mass. There is no abdominal tenderness. There is no rebound and no guarding.  Musculoskeletal: Normal range of motion.        General: No tenderness or edema.  Neurological: She is alert and oriented to person, place, and time.  Skin: Skin is warm.  Psychiatric: Affect normal.     LABORATORY DATA:  I have reviewed the data as listed Lab Results  Component Value Date   WBC 6.7 06/02/2018   HGB 11.7 (L) 06/02/2018   HCT 36.2 06/02/2018   MCV 95.3 06/02/2018   PLT 289 06/02/2018   Recent Labs    01/21/18 0840 05/20/18 0826 05/27/18 0849 06/02/18 1031  NA 140 140 139 136  K 4.3 3.9 4.1 4.1  CL 107 107 106 104  CO2 '29 28 27 25  '$ GLUCOSE 97 81 101*  91  BUN '14 17 15 14  '$ CREATININE 0.65 0.54 0.55 0.54  CALCIUM 9.1 8.6* 8.6* 8.7*  GFRNONAA  --  >60 >60 >60  GFRAA  --  >60 >60 >60  PROT 6.4 6.2* 6.2* 6.4*  ALBUMIN 3.6 3.3* 3.2* 3.5  AST '15 22 20 17  '$ ALT '14 18 21 19   '$ ALKPHOS 99 89 85 80  BILITOT 0.6 0.5 0.6 0.6  BILIDIR 0.1  --   --   --     RADIOGRAPHIC STUDIES: I have personally reviewed the radiological images as listed and agreed with the findings in the report. No results found.  ASSESSMENT & PLAN:   Carcinoma of upper-outer quadrant of right breast in female, estrogen receptor positive ([place]) # Stage I ER/PR-pos; Her 2 Neu amplified breast cancer.  Currently on adjuvant chemo-taxol-Herceptin.  # proceed with Taxol-herpcetin #3 today. Labs today reviewed;  acceptable for treatment today.   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  Discussed regarding cooling gloves/socks.   # DISPOSITION: # Treatment today # in 1 week-cbc/bmp/taxol-herceptin # in 2 week-MD-cbc/cmp/taxol-herceptin  All questions were answered. The patient knows to call the clinic with any problems, questions or concerns.    Cammie Sickle, MD 06/03/2018 8:43 AM

## 2018-06-03 NOTE — Assessment & Plan Note (Addendum)
#   Stage I ER/PR-pos; Her 2 Neu amplified breast cancer.  Currently on adjuvant chemo-taxol-Herceptin.  # proceed with Taxol-herpcetin #3 today. Labs today reviewed;  acceptable for treatment today.   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  Discussed regarding cooling gloves/socks.   # DISPOSITION: # Treatment today # in 1 week-cbc/bmp/taxol-herceptin # in 2 week-MD-cbc/cmp/taxol-herceptin

## 2018-06-10 ENCOUNTER — Inpatient Hospital Stay: Payer: Medicare HMO

## 2018-06-10 ENCOUNTER — Other Ambulatory Visit: Payer: Medicare HMO

## 2018-06-10 VITALS — BP 178/81 | HR 78 | Temp 97.0°F | Resp 18

## 2018-06-10 DIAGNOSIS — C50411 Malignant neoplasm of upper-outer quadrant of right female breast: Secondary | ICD-10-CM

## 2018-06-10 DIAGNOSIS — Z170 Estrogen receptor positive status [ER+]: Principal | ICD-10-CM

## 2018-06-10 DIAGNOSIS — Z5112 Encounter for antineoplastic immunotherapy: Secondary | ICD-10-CM | POA: Diagnosis not present

## 2018-06-10 LAB — CBC WITH DIFFERENTIAL/PLATELET
Abs Immature Granulocytes: 0.03 10*3/uL (ref 0.00–0.07)
BASOS ABS: 0.1 10*3/uL (ref 0.0–0.1)
Basophils Relative: 1 %
Eosinophils Absolute: 0.1 10*3/uL (ref 0.0–0.5)
Eosinophils Relative: 1 %
HEMATOCRIT: 35.5 % — AB (ref 36.0–46.0)
Hemoglobin: 11.5 g/dL — ABNORMAL LOW (ref 12.0–15.0)
Immature Granulocytes: 0 %
Lymphocytes Relative: 22 %
Lymphs Abs: 1.6 10*3/uL (ref 0.7–4.0)
MCH: 30.6 pg (ref 26.0–34.0)
MCHC: 32.4 g/dL (ref 30.0–36.0)
MCV: 94.4 fL (ref 80.0–100.0)
Monocytes Absolute: 0.7 10*3/uL (ref 0.1–1.0)
Monocytes Relative: 10 %
Neutro Abs: 4.7 10*3/uL (ref 1.7–7.7)
Neutrophils Relative %: 66 %
Platelets: 338 10*3/uL (ref 150–400)
RBC: 3.76 MIL/uL — ABNORMAL LOW (ref 3.87–5.11)
RDW: 13.9 % (ref 11.5–15.5)
WBC: 7.1 10*3/uL (ref 4.0–10.5)
nRBC: 0 % (ref 0.0–0.2)

## 2018-06-10 LAB — COMPREHENSIVE METABOLIC PANEL
ALT: 23 U/L (ref 0–44)
AST: 22 U/L (ref 15–41)
Albumin: 3.3 g/dL — ABNORMAL LOW (ref 3.5–5.0)
Alkaline Phosphatase: 100 U/L (ref 38–126)
Anion gap: 8 (ref 5–15)
BUN: 13 mg/dL (ref 8–23)
CO2: 26 mmol/L (ref 22–32)
Calcium: 8.5 mg/dL — ABNORMAL LOW (ref 8.9–10.3)
Chloride: 104 mmol/L (ref 98–111)
Creatinine, Ser: 0.63 mg/dL (ref 0.44–1.00)
GFR calc Af Amer: >60 mL/min (ref >60)
GFR calc non Af Amer: >60 mL/min (ref >60)
Glucose, Bld: 98 mg/dL (ref 70–99)
Potassium: 4.1 mmol/L (ref 3.5–5.1)
Sodium: 138 mmol/L (ref 135–145)
Total Bilirubin: 0.6 mg/dL (ref 0.3–1.2)
Total Protein: 6 g/dL — ABNORMAL LOW (ref 6.5–8.1)

## 2018-06-10 MED ORDER — SODIUM CHLORIDE 0.9 % IV SOLN
20.0000 mg | Freq: Once | INTRAVENOUS | Status: AC
  Filled 2018-06-10: qty 50

## 2018-06-10 MED ORDER — SODIUM CHLORIDE 0.9 % IV SOLN
80.0000 mg/m2 | Freq: Once | INTRAVENOUS | Status: AC
  Administered 2018-06-10: 150 mg via INTRAVENOUS
  Filled 2018-06-10: qty 25

## 2018-06-10 MED ORDER — ACETAMINOPHEN 325 MG PO TABS
650.0000 mg | ORAL_TABLET | Freq: Once | ORAL | Status: AC
  Administered 2018-06-10: 650 mg via ORAL
  Filled 2018-06-10: qty 2

## 2018-06-10 MED ORDER — TRASTUZUMAB CHEMO 150 MG IV SOLR
150.0000 mg | Freq: Once | INTRAVENOUS | Status: AC
  Administered 2018-06-10: 150 mg via INTRAVENOUS
  Filled 2018-06-10: qty 7.14

## 2018-06-10 MED ORDER — SODIUM CHLORIDE 0.9% FLUSH
10.0000 mL | INTRAVENOUS | Status: DC | PRN
  Administered 2018-06-10: 10 mL via INTRAVENOUS
  Filled 2018-06-10: qty 10

## 2018-06-10 MED ORDER — HEPARIN SOD (PORK) LOCK FLUSH 100 UNIT/ML IV SOLN
500.0000 [IU] | Freq: Once | INTRAVENOUS | Status: AC
  Administered 2018-06-10: 500 [IU] via INTRAVENOUS
  Filled 2018-06-10: qty 5

## 2018-06-10 MED ORDER — SODIUM CHLORIDE 0.9 % IV SOLN
20.0000 mg | Freq: Once | INTRAVENOUS | Status: AC
  Administered 2018-06-10: 20 mg via INTRAVENOUS
  Filled 2018-06-10: qty 2

## 2018-06-10 MED ORDER — DIPHENHYDRAMINE HCL 50 MG/ML IJ SOLN
50.0000 mg | Freq: Once | INTRAMUSCULAR | Status: AC
  Administered 2018-06-10: 50 mg via INTRAVENOUS
  Filled 2018-06-10: qty 1

## 2018-06-10 MED ORDER — FAMOTIDINE IN NACL 20-0.9 MG/50ML-% IV SOLN
20.0000 mg | Freq: Once | INTRAVENOUS | Status: AC
  Administered 2018-06-10: 20 mg via INTRAVENOUS

## 2018-06-10 MED ORDER — SODIUM CHLORIDE 0.9 % IV SOLN
Freq: Once | INTRAVENOUS | Status: AC
  Administered 2018-06-10: 09:00:00 via INTRAVENOUS
  Filled 2018-06-10: qty 250

## 2018-06-10 NOTE — Addendum Note (Signed)
Addended by: Charlyn Minerva on: 06/10/2018 09:04 AM   Modules accepted: Orders

## 2018-06-10 NOTE — Patient Instructions (Signed)
Paclitaxel injection What is this medicine? PACLITAXEL (PAK li TAX el) is a chemotherapy drug. It targets fast dividing cells, like cancer cells, and causes these cells to die. This medicine is used to treat ovarian cancer, breast cancer, lung cancer, Kaposi's sarcoma, and other cancers. This medicine may be used for other purposes; ask your health care provider or pharmacist if you have questions. COMMON BRAND NAME(S): Onxol, Taxol What should I tell my health care provider before I take this medicine? They need to know if you have any of these conditions: -history of irregular heartbeat -liver disease -low blood counts, like low white cell, platelet, or red cell counts -lung or breathing disease, like asthma -tingling of the fingers or toes, or other nerve disorder -an unusual or allergic reaction to paclitaxel, alcohol, polyoxyethylated castor oil, other chemotherapy, other medicines, foods, dyes, or preservatives -pregnant or trying to get pregnant -breast-feeding How should I use this medicine? This drug is given as an infusion into a vein. It is administered in a hospital or clinic by a specially trained health care professional. Talk to your pediatrician regarding the use of this medicine in children. Special care may be needed. Overdosage: If you think you have taken too much of this medicine contact a poison control center or emergency room at once. NOTE: This medicine is only for you. Do not share this medicine with others. What if I miss a dose? It is important not to miss your dose. Call your doctor or health care professional if you are unable to keep an appointment. What may interact with this medicine? Do not take this medicine with any of the following medications: -disulfiram -metronidazole This medicine may also interact with the following medications: -antiviral medicines for hepatitis, HIV or AIDS -certain antibiotics like erythromycin and clarithromycin -certain  medicines for fungal infections like ketoconazole and itraconazole -certain medicines for seizures like carbamazepine, phenobarbital, phenytoin -gemfibrozil -nefazodone -rifampin -St. John's wort This list may not describe all possible interactions. Give your health care provider a list of all the medicines, herbs, non-prescription drugs, or dietary supplements you use. Also tell them if you smoke, drink alcohol, or use illegal drugs. Some items may interact with your medicine. What should I watch for while using this medicine? Your condition will be monitored carefully while you are receiving this medicine. You will need important blood work done while you are taking this medicine. This medicine can cause serious allergic reactions. To reduce your risk you will need to take other medicine(s) before treatment with this medicine. If you experience allergic reactions like skin rash, itching or hives, swelling of the face, lips, or tongue, tell your doctor or health care professional right away. In some cases, you may be given additional medicines to help with side effects. Follow all directions for their use. This drug may make you feel generally unwell. This is not uncommon, as chemotherapy can affect healthy cells as well as cancer cells. Report any side effects. Continue your course of treatment even though you feel ill unless your doctor tells you to stop. Call your doctor or health care professional for advice if you get a fever, chills or sore throat, or other symptoms of a cold or flu. Do not treat yourself. This drug decreases your body's ability to fight infections. Try to avoid being around people who are sick. This medicine may increase your risk to bruise or bleed. Call your doctor or health care professional if you notice any unusual bleeding. Be careful brushing   your body's ability to fight infections. Try to avoid being around people who are sick.  This medicine may increase your risk to bruise or bleed. Call your doctor or health care professional if you notice any unusual bleeding.  Be careful brushing and flossing your teeth or using a toothpick because you may get an infection or bleed more easily. If you have any dental work  done, tell your dentist you are receiving this medicine.  Avoid taking products that contain aspirin, acetaminophen, ibuprofen, naproxen, or ketoprofen unless instructed by your doctor. These medicines may hide a fever.  Do not become pregnant while taking this medicine. Women should inform their doctor if they wish to become pregnant or think they might be pregnant. There is a potential for serious side effects to an unborn child. Talk to your health care professional or pharmacist for more information. Do not breast-feed an infant while taking this medicine.  Men are advised not to father a child while receiving this medicine.  This product may contain alcohol. Ask your pharmacist or healthcare provider if this medicine contains alcohol. Be sure to tell all healthcare providers you are taking this medicine. Certain medicines, like metronidazole and disulfiram, can cause an unpleasant reaction when taken with alcohol. The reaction includes flushing, headache, nausea, vomiting, sweating, and increased thirst. The reaction can last from 30 minutes to several hours.  What side effects may I notice from receiving this medicine?  Side effects that you should report to your doctor or health care professional as soon as possible:  -allergic reactions like skin rash, itching or hives, swelling of the face, lips, or tongue  -breathing problems  -changes in vision  -fast, irregular heartbeat  -high or low blood pressure  -mouth sores  -pain, tingling, numbness in the hands or feet  -signs of decreased platelets or bleeding - bruising, pinpoint red spots on the skin, black, tarry stools, blood in the urine  -signs of decreased red blood cells - unusually weak or tired, feeling faint or lightheaded, falls  -signs of infection - fever or chills, cough, sore throat, pain or difficulty passing urine  -signs and symptoms of liver injury like dark yellow or brown urine; general ill feeling or flu-like symptoms; light-colored  stools; loss of appetite; nausea; right upper belly pain; unusually weak or tired; yellowing of the eyes or skin  -swelling of the ankles, feet, hands  -unusually slow heartbeat  Side effects that usually do not require medical attention (report to your doctor or health care professional if they continue or are bothersome):  -diarrhea  -hair loss  -loss of appetite  -muscle or joint pain  -nausea, vomiting  -pain, redness, or irritation at site where injected  -tiredness  This list may not describe all possible side effects. Call your doctor for medical advice about side effects. You may report side effects to FDA at 1-800-FDA-1088.  Where should I keep my medicine?  This drug is given in a hospital or clinic and will not be stored at home.  NOTE: This sheet is a summary. It may not cover all possible information. If you have questions about this medicine, talk to your doctor, pharmacist, or health care provider.   2019 Elsevier/Gold Standard (2017-01-29 13:14:55)  Trastuzumab injection for infusion  What is this medicine?  TRASTUZUMAB (tras TOO zoo mab) is a monoclonal antibody. It is used to treat breast cancer and stomach cancer.  This medicine may be used for other purposes; ask your health care   provider or pharmacist if you have questions.  COMMON BRAND NAME(S): Herceptin, KANJINTI, OGIVRI  What should I tell my health care provider before I take this medicine?  They need to know if you have any of these conditions:  -heart disease  -heart failure  -lung or breathing disease, like asthma  -an unusual or allergic reaction to trastuzumab, benzyl alcohol, or other medications, foods, dyes, or preservatives  -pregnant or trying to get pregnant  -breast-feeding  How should I use this medicine?  This drug is given as an infusion into a vein. It is administered in a hospital or clinic by a specially trained health care professional.  Talk to your pediatrician regarding the use of this medicine in children. This  medicine is not approved for use in children.  Overdosage: If you think you have taken too much of this medicine contact a poison control center or emergency room at once.  NOTE: This medicine is only for you. Do not share this medicine with others.  What if I miss a dose?  It is important not to miss a dose. Call your doctor or health care professional if you are unable to keep an appointment.  What may interact with this medicine?  This medicine may interact with the following medications:  -certain types of chemotherapy, such as daunorubicin, doxorubicin, epirubicin, and idarubicin  This list may not describe all possible interactions. Give your health care provider a list of all the medicines, herbs, non-prescription drugs, or dietary supplements you use. Also tell them if you smoke, drink alcohol, or use illegal drugs. Some items may interact with your medicine.  What should I watch for while using this medicine?  Visit your doctor for checks on your progress. Report any side effects. Continue your course of treatment even though you feel ill unless your doctor tells you to stop.  Call your doctor or health care professional for advice if you get a fever, chills or sore throat, or other symptoms of a cold or flu. Do not treat yourself. Try to avoid being around people who are sick.  You may experience fever, chills and shaking during your first infusion. These effects are usually mild and can be treated with other medicines. Report any side effects during the infusion to your health care professional. Fever and chills usually do not happen with later infusions.  Do not become pregnant while taking this medicine or for 7 months after stopping it. Women should inform their doctor if they wish to become pregnant or think they might be pregnant. Women of child-bearing potential will need to have a negative pregnancy test before starting this medicine. There is a potential for serious side effects to an unborn  child. Talk to your health care professional or pharmacist for more information. Do not breast-feed an infant while taking this medicine or for 7 months after stopping it.  Women must use effective birth control with this medicine.  What side effects may I notice from receiving this medicine?  Side effects that you should report to your doctor or health care professional as soon as possible:  -allergic reactions like skin rash, itching or hives, swelling of the face, lips, or tongue  -chest pain or palpitations  -cough  -dizziness  -feeling faint or lightheaded, falls  -fever  -general ill feeling or flu-like symptoms  -signs of worsening heart failure like breathing problems; swelling in your legs and feet  -unusually weak or tired  Side effects that usually do not

## 2018-06-17 ENCOUNTER — Inpatient Hospital Stay (HOSPITAL_BASED_OUTPATIENT_CLINIC_OR_DEPARTMENT_OTHER): Payer: Medicare HMO | Admitting: Internal Medicine

## 2018-06-17 ENCOUNTER — Other Ambulatory Visit: Payer: Self-pay

## 2018-06-17 ENCOUNTER — Inpatient Hospital Stay: Payer: Medicare HMO | Attending: Internal Medicine

## 2018-06-17 ENCOUNTER — Inpatient Hospital Stay: Payer: Medicare HMO

## 2018-06-17 VITALS — BP 148/68 | HR 70 | Temp 94.9°F | Resp 18 | Wt 168.2 lb

## 2018-06-17 VITALS — Temp 97.4°F

## 2018-06-17 DIAGNOSIS — Z170 Estrogen receptor positive status [ER+]: Secondary | ICD-10-CM | POA: Diagnosis not present

## 2018-06-17 DIAGNOSIS — Z5111 Encounter for antineoplastic chemotherapy: Secondary | ICD-10-CM | POA: Diagnosis present

## 2018-06-17 DIAGNOSIS — G620 Drug-induced polyneuropathy: Secondary | ICD-10-CM | POA: Insufficient documentation

## 2018-06-17 DIAGNOSIS — Z8543 Personal history of malignant neoplasm of ovary: Secondary | ICD-10-CM | POA: Insufficient documentation

## 2018-06-17 DIAGNOSIS — Z5112 Encounter for antineoplastic immunotherapy: Secondary | ICD-10-CM | POA: Diagnosis present

## 2018-06-17 DIAGNOSIS — C50411 Malignant neoplasm of upper-outer quadrant of right female breast: Secondary | ICD-10-CM

## 2018-06-17 LAB — COMPREHENSIVE METABOLIC PANEL
ALT: 26 U/L (ref 0–44)
ANION GAP: 9 (ref 5–15)
AST: 28 U/L (ref 15–41)
Albumin: 3.3 g/dL — ABNORMAL LOW (ref 3.5–5.0)
Alkaline Phosphatase: 104 U/L (ref 38–126)
BUN: 14 mg/dL (ref 8–23)
CO2: 24 mmol/L (ref 22–32)
Calcium: 8.4 mg/dL — ABNORMAL LOW (ref 8.9–10.3)
Chloride: 107 mmol/L (ref 98–111)
Creatinine, Ser: 0.64 mg/dL (ref 0.44–1.00)
Glucose, Bld: 125 mg/dL — ABNORMAL HIGH (ref 70–99)
Potassium: 3.6 mmol/L (ref 3.5–5.1)
Sodium: 140 mmol/L (ref 135–145)
Total Bilirubin: 0.5 mg/dL (ref 0.3–1.2)
Total Protein: 5.9 g/dL — ABNORMAL LOW (ref 6.5–8.1)

## 2018-06-17 LAB — CBC WITH DIFFERENTIAL/PLATELET
Abs Immature Granulocytes: 0.05 10*3/uL (ref 0.00–0.07)
Basophils Absolute: 0.1 10*3/uL (ref 0.0–0.1)
Basophils Relative: 1 %
EOS PCT: 1 %
Eosinophils Absolute: 0 10*3/uL (ref 0.0–0.5)
HCT: 35 % — ABNORMAL LOW (ref 36.0–46.0)
HEMOGLOBIN: 11.3 g/dL — AB (ref 12.0–15.0)
Immature Granulocytes: 1 %
Lymphocytes Relative: 21 %
Lymphs Abs: 1.6 10*3/uL (ref 0.7–4.0)
MCH: 30.7 pg (ref 26.0–34.0)
MCHC: 32.3 g/dL (ref 30.0–36.0)
MCV: 95.1 fL (ref 80.0–100.0)
Monocytes Absolute: 0.6 10*3/uL (ref 0.1–1.0)
Monocytes Relative: 7 %
Neutro Abs: 5.2 10*3/uL (ref 1.7–7.7)
Neutrophils Relative %: 69 %
Platelets: 292 10*3/uL (ref 150–400)
RBC: 3.68 MIL/uL — ABNORMAL LOW (ref 3.87–5.11)
RDW: 13.9 % (ref 11.5–15.5)
WBC: 7.4 10*3/uL (ref 4.0–10.5)
nRBC: 0 % (ref 0.0–0.2)

## 2018-06-17 MED ORDER — SODIUM CHLORIDE 0.9 % IV SOLN
80.0000 mg/m2 | Freq: Once | INTRAVENOUS | Status: AC
  Administered 2018-06-17: 150 mg via INTRAVENOUS
  Filled 2018-06-17: qty 25

## 2018-06-17 MED ORDER — SODIUM CHLORIDE 0.9 % IV SOLN
20.0000 mg | Freq: Once | INTRAVENOUS | Status: AC
  Administered 2018-06-17: 20 mg via INTRAVENOUS
  Filled 2018-06-17: qty 50

## 2018-06-17 MED ORDER — TRASTUZUMAB CHEMO 150 MG IV SOLR
150.0000 mg | Freq: Once | INTRAVENOUS | Status: AC
  Administered 2018-06-17: 150 mg via INTRAVENOUS
  Filled 2018-06-17: qty 7.14

## 2018-06-17 MED ORDER — HEPARIN SOD (PORK) LOCK FLUSH 100 UNIT/ML IV SOLN
500.0000 [IU] | Freq: Once | INTRAVENOUS | Status: AC | PRN
  Administered 2018-06-17: 500 [IU]
  Filled 2018-06-17: qty 5

## 2018-06-17 MED ORDER — ACETAMINOPHEN 325 MG PO TABS
650.0000 mg | ORAL_TABLET | Freq: Once | ORAL | Status: AC
  Administered 2018-06-17: 650 mg via ORAL
  Filled 2018-06-17: qty 2

## 2018-06-17 MED ORDER — SODIUM CHLORIDE 0.9 % IV SOLN
20.0000 mg | Freq: Once | INTRAVENOUS | Status: AC
  Administered 2018-06-17: 20 mg via INTRAVENOUS
  Filled 2018-06-17: qty 2

## 2018-06-17 MED ORDER — AMLODIPINE BESYLATE 2.5 MG PO TABS: 2.5000 mg | ORAL_TABLET | Freq: Every day | ORAL | 2 refills | Status: DC

## 2018-06-17 MED ORDER — FAMOTIDINE IN NACL 20-0.9 MG/50ML-% IV SOLN: 20.0000 mg | Freq: Once | INTRAVENOUS | Status: DC

## 2018-06-17 MED ORDER — DIPHENHYDRAMINE HCL 50 MG/ML IJ SOLN
50.0000 mg | Freq: Once | INTRAMUSCULAR | Status: AC
  Administered 2018-06-17: 50 mg via INTRAVENOUS
  Filled 2018-06-17: qty 1

## 2018-06-17 MED ORDER — SODIUM CHLORIDE 0.9 % IV SOLN
Freq: Once | INTRAVENOUS | Status: AC
  Administered 2018-06-17: 10:00:00 via INTRAVENOUS
  Filled 2018-06-17: qty 250

## 2018-06-17 NOTE — Progress Notes (Signed)
Here for follow up. Overall " feeling good " per pt.

## 2018-06-17 NOTE — Addendum Note (Signed)
Addended by: Sandria Bales B on: 06/17/2018 02:30 PM   Modules accepted: Orders

## 2018-06-17 NOTE — Progress Notes (Signed)
[name] CONSULT NOTE  Patient Care Team: Einar Pheasant, MD as PCP - General (Internal Medicine) Caryl Bis Angela Adam, MD as Consulting Physician (Family Medicine) Christene Lye, MD (General Surgery) Einar Pheasant, MD (Internal Medicine) Bary Castilla, Forest Gleason, MD (General Surgery) Schnier, Dolores Lory, MD as Consulting Physician (Vascular Surgery)  CHIEF COMPLAINTS/PURPOSE OF CONSULTATION:    #  Oncology History   # OCT 2019- RUOQ IMC-stage I [lumpectomy sentinel lymph node biopsy [facility]; T=0.6 cm; N=0] ER/PR-POSITIVE;her 2 neu-AMPLIFIED [2.28]  # Dec 10th 2019- Taxol-Herceptin weekly x12.   # Oct 2nd EF-55-60% [[facility]]  # Ovarian cancer 2002; s/p carboTaxol x6 [[place]];  Ovarian cancer treated in 2003, she underwent exploratory laparotomy, TAH/BSO, omentectomy, peritoneal biopsies, selective pelvic and periaortic lymph node sampling in [place] for a Grade 1 papillary serous carcinoma of the ovary on June 20, 2001. Last tine she saw GYN oncology was 2011 (Dr. Erasmo Leventhal)  # Genetic testing? [[facility]]  DIAGNOSIS: Breast cancer  STAGE:  I  ;GOALS: cure  CURRENT/MOST RECENT THERAPY: Taxol+herceptin      Carcinoma of upper-outer quadrant of right breast in female, estrogen receptor positive ([place])      HISTORY OF PRESENTING ILLNESS:  [name] [name] 85 y.o.  female with stage I ER PR positive HER-2/neu amplified breast cancer is here for follow-up/currently on Taxol Herceptin weekly.  Patient complains of mild numbness intermittently.  Currently none.  Complains of hair loss.  Denies any significant nausea vomiting.  States her blood pressure has been elevated at home systolic around 712W.  Denies any pain.  Review of Systems  Constitutional: Negative for chills, diaphoresis, fever, malaise/fatigue and weight loss.  HENT: Negative for nosebleeds and sore throat.   Eyes: Negative for double vision.  Respiratory: Negative for cough, hemoptysis,  sputum production, shortness of breath and wheezing.   Cardiovascular: Negative for chest pain, palpitations, orthopnea and leg swelling.  Gastrointestinal: Negative for abdominal pain, blood in stool, constipation, diarrhea, heartburn, melena, nausea and vomiting.  Genitourinary: Negative for dysuria, frequency and urgency.  Musculoskeletal: Negative for back pain and joint pain.  Skin: Negative.  Negative for itching and rash.  Neurological: Negative for dizziness, tingling, focal weakness, weakness and headaches.       Mild numbness in the feet.  Endo/Heme/Allergies: Does not bruise/bleed easily.  Psychiatric/Behavioral: Negative for depression. The patient is not nervous/anxious and does not have insomnia.      MEDICAL HISTORY:  Past Medical History:  Diagnosis Date  . Diverticulitis of sigmoid colon   . Endometriosis   . Fibrocystic breast disease   . Glaucoma   . H/O urinary incontinence   . History of chicken pox   . History of colon polyps 2014  . Hyperlipidemia   . Hypertension   . Ovarian cancer ([place]) 2002   Chemo tx's @ [facility] with Total Hysterectomy. Dr Amalia Hailey  . Personal history of chemotherapy    ovarian cancer  . Port-A-Cath in place   . Sinusitis   . Tuberculosis    positve skin test    SURGICAL HISTORY: Past Surgical History:  Procedure Laterality Date  . ABDOMINAL HYSTERECTOMY  2003   complete Dr Amalia Hailey  . ABSCESS DRAINAGE  2017   right buttock at [facility]  . APPENDECTOMY  2003  . BREAST EXCISIONAL BIOPSY Left 1993   excisional bx. negative results  . BREAST SURGERY Left 1982   papilloma removal  . COLONOSCOPY  2006, 2014   Dr Vira Agar   . COLONOSCOPY N/A 03/27/2016  Procedure: COLONOSCOPY;  Surgeon: Robert Bellow, MD;  Location: [facility] ENDOSCOPY;  Service: Endoscopy;  Laterality: N/A;  . COLOSTOMY REVERSAL  2014   at [place] Dr Raynaldo Opitz  . NASAL SINUS SURGERY     For fungal infection  . NASAL SINUS SURGERY  8/05 8/06  . OOPHORECTOMY    . OSTOMY   05/22/2012   diverticulitis with abcess/ Dr Tamala Julian  . ovarian cancer     Exploratory lap  . PORTA CATH INSERTION N/A 05/26/2018   Procedure: PORTA CATH INSERTION;  Surgeon: Algernon Huxley, MD;  Location: [place] CV LAB;  Service: Cardiovascular;  Laterality: N/A;  . TONSILLECTOMY AND ADENOIDECTOMY  1947    SOCIAL HISTORY: Social History   Socioeconomic History  . Marital status: Widowed    Spouse name: Not on file  . Number of children: 0  . Years of education: Not on file  . Highest education level: Not on file  Occupational History  . Not on file  Social Needs  . Financial resource strain: Not hard at all  . Food insecurity:    Worry: Never true    Inability: Never true  . Transportation needs:    Medical: No    Non-medical: No  Tobacco Use  . Smoking status: Never Smoker  . Smokeless tobacco: Never Used  Substance and Sexual Activity  . Alcohol use: No    Alcohol/week: 0.0 standard drinks  . Drug use: No  . Sexual activity: Not Currently  Lifestyle  . Physical activity:    Days per week: Not on file    Minutes per session: Not on file  . Stress: Not on file  Relationships  . Social connections:    Talks on phone: Not on file    Gets together: Not on file    Attends religious service: Not on file    Active member of club or organization: Not on file    Attends meetings of clubs or organizations: Not on file    Relationship status: Not on file  . Intimate partner violence:    Fear of current or ex partner: Not on file    Emotionally abused: Not on file    Physically abused: Not on file    Forced sexual activity: Not on file  Other Topics Concern  . Not on file  Social History Narrative  . Not on file    FAMILY HISTORY: Family History  Problem Relation Age of Onset  . Hypertension Mother   . Coronary artery disease Mother   . Heart disease Mother   . Kidney disease Mother   . Diabetes Mother   . Cancer Mother        uterine cancer  . Diabetes  Father   . Kidney failure Father   . Diabetes Brother   . Coronary artery disease Brother   . Cancer Brother        bladder cancer  . Stroke Sister   . Heart disease Sister        [facility]  . Cancer Maternal Aunt        ovarian cancer  . Breast cancer Cousin   . Cancer Brother        Prostate  . Breast cancer Cousin   . Breast cancer Cousin     ALLERGIES:  has No Known Allergies.  MEDICATIONS:  Current Outpatient Medications  Medication Sig Dispense Refill  . Calcium Carbonate-Vitamin D 600-400 MG-UNIT tablet Take by mouth.    Marland Kitchen  lidocaine-prilocaine (EMLA) cream Apply 1 application topically as needed. 30 g 5  . amLODipine (NORVASC) 2.5 MG tablet Take 1 tablet (2.5 mg total) by mouth daily. 30 tablet 2  . ondansetron (ZOFRAN) 8 MG tablet Take 1 tablet (8 mg total) by mouth every 8 (eight) hours as needed for nausea or vomiting. (Patient not taking: Reported on 06/17/2018) 20 tablet 3  . prochlorperazine (COMPAZINE) 10 MG tablet Take 1 tablet (10 mg total) by mouth every 6 (six) hours as needed for nausea or vomiting. (Patient not taking: Reported on 06/17/2018) 30 tablet 3   Current Facility-Administered Medications  Medication Dose Route Frequency Provider Last Rate Last Dose  . lidocaine-prilocaine (EMLA) cream   Topical PRN Lucky Cowboy, Erskine Squibb, MD       Facility-Administered Medications Ordered in Other Visits  Medication Dose Route Frequency Provider Last Rate Last Dose  . sodium chloride 0.9 % 50 mL with famotidine (PEPCID) 20 mg infusion  20 mg Intravenous Once Charlaine Dalton R, MD          .  PHYSICAL EXAMINATION: ECOG PERFORMANCE STATUS: 0 - Asymptomatic  Vitals:   06/17/18 0854  BP: (!) 148/68  Pulse: 70  Resp: 18  Temp: (!) 94.9 F (34.9 C)   Filed Weights   06/17/18 0854  Weight: 168 lb 3.4 oz (76.3 kg)    Physical Exam  Constitutional: She is oriented to person, place, and time and well-developed, well-nourished, and in no distress.  She is  accompanied by a friend.  She is walking herself.  HENT:  Head: Normocephalic and atraumatic.  Mouth/Throat: Oropharynx is clear and moist. No oropharyngeal exudate.  Eyes: Pupils are equal, round, and reactive to light.  Neck: Normal range of motion. Neck supple.  Cardiovascular: Normal rate and regular rhythm.  Pulmonary/Chest: No respiratory distress. She has no wheezes.  Abdominal: Soft. Bowel sounds are normal. She exhibits no distension and no mass. There is no abdominal tenderness. There is no rebound and no guarding.  Musculoskeletal: Normal range of motion.        General: No tenderness or edema.  Neurological: She is alert and oriented to person, place, and time.  Skin: Skin is warm.  Psychiatric: Affect normal.     LABORATORY DATA:  I have reviewed the data as listed Lab Results  Component Value Date   WBC 7.4 06/17/2018   HGB 11.3 (L) 06/17/2018   HCT 35.0 (L) 06/17/2018   MCV 95.1 06/17/2018   PLT 292 06/17/2018   Recent Labs    01/21/18 0840  06/02/18 1031 06/10/18 0822 06/17/18 0834  NA 140   < > 136 138 140  K 4.3   < > 4.1 4.1 3.6  CL 107   < > 104 104 107  CO2 29   < > _0 GLUCOSE 97   < > 91 98 125*  BUN 14   < > _1 CREATININE 0.65   < > 0.54 0.63 0.64  CALCIUM 9.1   < > 8.7* 8.5* 8.4*  GFRNONAA  --    < > >60 >60 >60  GFRAA  --    < > >60 >60 >60  PROT 6.4   < > 6.4* 6.0* 5.9*  ALBUMIN 3.6   < > 3.5 3.3* 3.3*  AST 15   < > _2 ALT 14   < > _3 ALKPHOS 99   < > 80 100 104  BILITOT  0.6   < > 0.6 0.6 0.5  BILIDIR 0.1  --   --   --   --    < > = values in this interval not displayed.    RADIOGRAPHIC STUDIES: I have personally reviewed the radiological images as listed and agreed with the findings in the report. No results found.  ASSESSMENT & PLAN:   Carcinoma of upper-outer quadrant of right breast in female, estrogen receptor positive ([place]) # Stage I ER/PR-pos; Her 2 Neu amplified breast cancer.  Currently on  adjuvant chemo-taxol-Herceptin.  # proceed with Taxol-herpcetin cycle number 2-day 1 today.  Labs today reviewed;  acceptable for treatment today.  Again reviewed the treatment plan in length including schedule; radiation; also antihormone therapy post radiation.  # Elevated Blood pressure-recommend adding Norvasc 2.5 mg once a day.  Discussed the potential side effects.  #Fhx of cancer- No children; 2 brothers- cancers [oldest brother- bladder; other brother- prostate cancer ]; mom-? Gynecologic;mom' sister-died of ovarian cancer.  will refer to genetics.   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  Stable.  Monitor closely.  # DISPOSITION: # Treatment today # genetic referral in next 4-6 weeks.  # in 1 week-cbc/bmp/taxol-herceptin # in 2 week-MD-cbc/cmp/taxol-herceptin  All questions were answered. The patient knows to call the clinic with any problems, questions or concerns.    Cammie Sickle, MD 06/17/2018 9:37 AM

## 2018-06-17 NOTE — Assessment & Plan Note (Addendum)
#   Stage I ER/PR-pos; Her 2 Neu amplified breast cancer.  Currently on adjuvant chemo-taxol-Herceptin.  # proceed with Taxol-herpcetin cycle number 2-day 1 today.  Labs today reviewed;  acceptable for treatment today.  Again reviewed the treatment plan in length including schedule; radiation; also antihormone therapy post radiation.  # Elevated Blood pressure-recommend adding Norvasc 2.5 mg once a day.  Discussed the potential side effects.  #Fhx of cancer- No children; 2 brothers- cancers [oldest brother- bladder; other brother- prostate cancer ]; mom-? Gynecologic;mom' sister-died of ovarian cancer.  will refer to genetics.   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  Stable.  Monitor closely.  # DISPOSITION: # Treatment today # genetic referral in next 4-6 weeks.  # in 1 week-cbc/bmp/taxol-herceptin # in 2 week-MD-cbc/cmp/taxol-herceptin

## 2018-06-23 ENCOUNTER — Ambulatory Visit: Payer: Self-pay

## 2018-06-23 NOTE — Telephone Encounter (Signed)
The 10:00 tomorrow is being held.  See note.  If this is not going to be used, then have Ms Mash come in at 10:00 tomorrow.

## 2018-06-23 NOTE — Telephone Encounter (Signed)
Pt called with C/O elevating BP readings.  Today BP 148/68 and 154/75 HR 71. Pt states she is currently being treated for breast CA and has been concerned because her BP is elevating. She said her oncology doctor prescribed Norvasc 2.5 mg daily but she is still concerned.  She is having no symptoms.  She states that she can find blood in her sinuses but she has has a sinus infection. She states she has taken medication for BP in the past but was taken off medication after a stay at [facility]. Patient scheduled appointment with Dr Nicki Reaper off protocol.  Pt preferred to see her Dr and refused earlier appointment with another provider.  Care advice read to patient. Pt verbalized understanding of all instructions.  Reason for Disposition . Systolic BP  >= 357 OR Diastolic >= 017  Answer Assessment - Initial Assessment Questions 1. BLOOD PRESSURE: "What is the blood pressure?" "Did you take at least two measurements 5 minutes apart?"     142/72  154/75 HR 71 2. ONSET: "When did you take your blood pressure?"     0900 3. HOW: "How did you obtain the blood pressure?" (e.g., visiting nurse, automatic home BP monitor)     Home BP 4. HISTORY: "Do you have a history of high blood pressure?"     Yes but taken off by [facility] 5. MEDICATIONS: "Are you taking any medications for blood pressure?" "Have you missed any doses recently?"     Norvasc 2.5mg  6. OTHER SYMPTOMS: "Do you have any symptoms?" (e.g., headache, chest pain, blurred vision, difficulty breathing, weakness)     Bleeding nose sometimes.  From sinus infection? 7. PREGNANCY: "Is there any chance you are pregnant?" "When was your last menstrual period?"     N/A  Protocols used: HIGH BLOOD PRESSURE-A-AH

## 2018-06-23 NOTE — Telephone Encounter (Signed)
Patient has been scheduled for Friday. Are you okay with her monitoring pressures and waiting until Friday or would you like to move appt up?

## 2018-06-24 ENCOUNTER — Inpatient Hospital Stay: Payer: Medicare HMO

## 2018-06-24 VITALS — BP 134/74 | HR 69 | Temp 95.5°F | Resp 18

## 2018-06-24 DIAGNOSIS — Z170 Estrogen receptor positive status [ER+]: Principal | ICD-10-CM

## 2018-06-24 DIAGNOSIS — Z5112 Encounter for antineoplastic immunotherapy: Secondary | ICD-10-CM | POA: Diagnosis not present

## 2018-06-24 DIAGNOSIS — C50411 Malignant neoplasm of upper-outer quadrant of right female breast: Secondary | ICD-10-CM

## 2018-06-24 LAB — COMPREHENSIVE METABOLIC PANEL
ALBUMIN: 3.4 g/dL — AB (ref 3.5–5.0)
ALT: 24 U/L (ref 0–44)
AST: 21 U/L (ref 15–41)
Alkaline Phosphatase: 99 U/L (ref 38–126)
Anion gap: 6 (ref 5–15)
BUN: 14 mg/dL (ref 8–23)
CO2: 27 mmol/L (ref 22–32)
Calcium: 8.7 mg/dL — ABNORMAL LOW (ref 8.9–10.3)
Chloride: 107 mmol/L (ref 98–111)
Creatinine, Ser: 0.63 mg/dL (ref 0.44–1.00)
GFR calc Af Amer: >60 mL/min (ref >60)
GFR calc non Af Amer: >60 mL/min (ref >60)
GLUCOSE: 92 mg/dL (ref 70–99)
Potassium: 4.2 mmol/L (ref 3.5–5.1)
Sodium: 140 mmol/L (ref 135–145)
Total Bilirubin: 0.4 mg/dL (ref 0.3–1.2)
Total Protein: 6 g/dL — ABNORMAL LOW (ref 6.5–8.1)

## 2018-06-24 LAB — CBC WITH DIFFERENTIAL/PLATELET
ABS IMMATURE GRANULOCYTES: 0.06 10*3/uL (ref 0.00–0.07)
Basophils Absolute: 0 10*3/uL (ref 0.0–0.1)
Basophils Relative: 1 %
Eosinophils Absolute: 0.1 10*3/uL (ref 0.0–0.5)
Eosinophils Relative: 1 %
HCT: 35.3 % — ABNORMAL LOW (ref 36.0–46.0)
Hemoglobin: 11.5 g/dL — ABNORMAL LOW (ref 12.0–15.0)
IMMATURE GRANULOCYTES: 1 %
Lymphocytes Relative: 24 %
Lymphs Abs: 1.8 10*3/uL (ref 0.7–4.0)
MCH: 31.1 pg (ref 26.0–34.0)
MCHC: 32.6 g/dL (ref 30.0–36.0)
MCV: 95.4 fL (ref 80.0–100.0)
MONO ABS: 0.7 10*3/uL (ref 0.1–1.0)
Monocytes Relative: 9 %
Neutro Abs: 4.9 10*3/uL (ref 1.7–7.7)
Neutrophils Relative %: 64 %
Platelets: 302 10*3/uL (ref 150–400)
RBC: 3.7 MIL/uL — ABNORMAL LOW (ref 3.87–5.11)
RDW: 14.1 % (ref 11.5–15.5)
WBC: 7.5 10*3/uL (ref 4.0–10.5)
nRBC: 0 % (ref 0.0–0.2)

## 2018-06-24 MED ORDER — HEPARIN SOD (PORK) LOCK FLUSH 100 UNIT/ML IV SOLN
500.0000 [IU] | Freq: Once | INTRAVENOUS | Status: AC
  Administered 2018-06-24: 500 [IU] via INTRAVENOUS
  Filled 2018-06-24: qty 5

## 2018-06-24 MED ORDER — ACETAMINOPHEN 325 MG PO TABS
650.0000 mg | ORAL_TABLET | Freq: Once | ORAL | Status: AC
  Administered 2018-06-24: 650 mg via ORAL
  Filled 2018-06-24: qty 2

## 2018-06-24 MED ORDER — SODIUM CHLORIDE 0.9% FLUSH
10.0000 mL | INTRAVENOUS | Status: AC | PRN
  Administered 2018-06-24: 10 mL via INTRAVENOUS
  Filled 2018-06-24: qty 10

## 2018-06-24 MED ORDER — FAMOTIDINE IN NACL 20-0.9 MG/50ML-% IV SOLN
20.0000 mg | Freq: Once | INTRAVENOUS | Status: AC
  Administered 2018-06-24: 20 mg via INTRAVENOUS
  Filled 2018-06-24: qty 50

## 2018-06-24 MED ORDER — TRASTUZUMAB CHEMO 150 MG IV SOLR
150.0000 mg | Freq: Once | INTRAVENOUS | Status: AC
  Administered 2018-06-24: 150 mg via INTRAVENOUS
  Filled 2018-06-24: qty 7.14

## 2018-06-24 MED ORDER — SODIUM CHLORIDE 0.9 % IV SOLN
20.0000 mg | Freq: Once | INTRAVENOUS | Status: AC
  Administered 2018-06-24: 20 mg via INTRAVENOUS
  Filled 2018-06-24: qty 2

## 2018-06-24 MED ORDER — SODIUM CHLORIDE 0.9 % IV SOLN
80.0000 mg/m2 | Freq: Once | INTRAVENOUS | Status: AC
  Administered 2018-06-24: 150 mg via INTRAVENOUS
  Filled 2018-06-24: qty 25

## 2018-06-24 MED ORDER — SODIUM CHLORIDE 0.9 % IV SOLN
Freq: Once | INTRAVENOUS | Status: AC
  Administered 2018-06-24: 09:00:00 via INTRAVENOUS
  Filled 2018-06-24: qty 250

## 2018-06-24 MED ORDER — DIPHENHYDRAMINE HCL 50 MG/ML IJ SOLN
50.0000 mg | Freq: Once | INTRAMUSCULAR | Status: AC
  Administered 2018-06-24: 50 mg via INTRAVENOUS
  Filled 2018-06-24: qty 1

## 2018-06-24 NOTE — Patient Instructions (Signed)
Paclitaxel injection What is this medicine? PACLITAXEL (PAK li TAX el) is a chemotherapy drug. It targets fast dividing cells, like cancer cells, and causes these cells to die. This medicine is used to treat ovarian cancer, breast cancer, lung cancer, Kaposi's sarcoma, and other cancers. This medicine may be used for other purposes; ask your health care provider or pharmacist if you have questions. COMMON BRAND NAME(S): Onxol, Taxol What should I tell my health care provider before I take this medicine? They need to know if you have any of these conditions: -history of irregular heartbeat -liver disease -low blood counts, like low white cell, platelet, or red cell counts -lung or breathing disease, like asthma -tingling of the fingers or toes, or other nerve disorder -an unusual or allergic reaction to paclitaxel, alcohol, polyoxyethylated castor oil, other chemotherapy, other medicines, foods, dyes, or preservatives -pregnant or trying to get pregnant -breast-feeding How should I use this medicine? This drug is given as an infusion into a vein. It is administered in a hospital or clinic by a specially trained health care professional. Talk to your pediatrician regarding the use of this medicine in children. Special care may be needed. Overdosage: If you think you have taken too much of this medicine contact a poison control center or emergency room at once. NOTE: This medicine is only for you. Do not share this medicine with others. What if I miss a dose? It is important not to miss your dose. Call your doctor or health care professional if you are unable to keep an appointment. What may interact with this medicine? Do not take this medicine with any of the following medications: -disulfiram -metronidazole This medicine may also interact with the following medications: -antiviral medicines for hepatitis, HIV or AIDS -certain antibiotics like erythromycin and clarithromycin -certain  medicines for fungal infections like ketoconazole and itraconazole -certain medicines for seizures like carbamazepine, phenobarbital, phenytoin -gemfibrozil -nefazodone -rifampin -St. John's wort This list may not describe all possible interactions. Give your health care provider a list of all the medicines, herbs, non-prescription drugs, or dietary supplements you use. Also tell them if you smoke, drink alcohol, or use illegal drugs. Some items may interact with your medicine. What should I watch for while using this medicine? Your condition will be monitored carefully while you are receiving this medicine. You will need important blood work done while you are taking this medicine. This medicine can cause serious allergic reactions. To reduce your risk you will need to take other medicine(s) before treatment with this medicine. If you experience allergic reactions like skin rash, itching or hives, swelling of the face, lips, or tongue, tell your doctor or health care professional right away. In some cases, you may be given additional medicines to help with side effects. Follow all directions for their use. This drug may make you feel generally unwell. This is not uncommon, as chemotherapy can affect healthy cells as well as cancer cells. Report any side effects. Continue your course of treatment even though you feel ill unless your doctor tells you to stop. Call your doctor or health care professional for advice if you get a fever, chills or sore throat, or other symptoms of a cold or flu. Do not treat yourself. This drug decreases your body's ability to fight infections. Try to avoid being around people who are sick. This medicine may increase your risk to bruise or bleed. Call your doctor or health care professional if you notice any unusual bleeding. Be careful brushing   your body's ability to fight infections. Try to avoid being around people who are sick.  This medicine may increase your risk to bruise or bleed. Call your doctor or health care professional if you notice any unusual bleeding.  Be careful brushing and flossing your teeth or using a toothpick because you may get an infection or bleed more easily. If you have any dental work  done, tell your dentist you are receiving this medicine.  Avoid taking products that contain aspirin, acetaminophen, ibuprofen, naproxen, or ketoprofen unless instructed by your doctor. These medicines may hide a fever.  Do not become pregnant while taking this medicine. Women should inform their doctor if they wish to become pregnant or think they might be pregnant. There is a potential for serious side effects to an unborn child. Talk to your health care professional or pharmacist for more information. Do not breast-feed an infant while taking this medicine.  Men are advised not to father a child while receiving this medicine.  This product may contain alcohol. Ask your pharmacist or healthcare provider if this medicine contains alcohol. Be sure to tell all healthcare providers you are taking this medicine. Certain medicines, like metronidazole and disulfiram, can cause an unpleasant reaction when taken with alcohol. The reaction includes flushing, headache, nausea, vomiting, sweating, and increased thirst. The reaction can last from 30 minutes to several hours.  What side effects may I notice from receiving this medicine?  Side effects that you should report to your doctor or health care professional as soon as possible:  -allergic reactions like skin rash, itching or hives, swelling of the face, lips, or tongue  -breathing problems  -changes in vision  -fast, irregular heartbeat  -high or low blood pressure  -mouth sores  -pain, tingling, numbness in the hands or feet  -signs of decreased platelets or bleeding - bruising, pinpoint red spots on the skin, black, tarry stools, blood in the urine  -signs of decreased red blood cells - unusually weak or tired, feeling faint or lightheaded, falls  -signs of infection - fever or chills, cough, sore throat, pain or difficulty passing urine  -signs and symptoms of liver injury like dark yellow or brown urine; general ill feeling or flu-like symptoms; light-colored  stools; loss of appetite; nausea; right upper belly pain; unusually weak or tired; yellowing of the eyes or skin  -swelling of the ankles, feet, hands  -unusually slow heartbeat  Side effects that usually do not require medical attention (report to your doctor or health care professional if they continue or are bothersome):  -diarrhea  -hair loss  -loss of appetite  -muscle or joint pain  -nausea, vomiting  -pain, redness, or irritation at site where injected  -tiredness  This list may not describe all possible side effects. Call your doctor for medical advice about side effects. You may report side effects to FDA at 1-800-FDA-1088.  Where should I keep my medicine?  This drug is given in a hospital or clinic and will not be stored at home.  NOTE: This sheet is a summary. It may not cover all possible information. If you have questions about this medicine, talk to your doctor, pharmacist, or health care provider.   2019 Elsevier/Gold Standard (2017-01-29 13:14:55)  Trastuzumab injection for infusion  What is this medicine?  TRASTUZUMAB (tras TOO zoo mab) is a monoclonal antibody. It is used to treat breast cancer and stomach cancer.  This medicine may be used for other purposes; ask your health care   provider or pharmacist if you have questions.  COMMON BRAND NAME(S): Herceptin, KANJINTI, OGIVRI  What should I tell my health care provider before I take this medicine?  They need to know if you have any of these conditions:  -heart disease  -heart failure  -lung or breathing disease, like asthma  -an unusual or allergic reaction to trastuzumab, benzyl alcohol, or other medications, foods, dyes, or preservatives  -pregnant or trying to get pregnant  -breast-feeding  How should I use this medicine?  This drug is given as an infusion into a vein. It is administered in a hospital or clinic by a specially trained health care professional.  Talk to your pediatrician regarding the use of this medicine in children. This  medicine is not approved for use in children.  Overdosage: If you think you have taken too much of this medicine contact a poison control center or emergency room at once.  NOTE: This medicine is only for you. Do not share this medicine with others.  What if I miss a dose?  It is important not to miss a dose. Call your doctor or health care professional if you are unable to keep an appointment.  What may interact with this medicine?  This medicine may interact with the following medications:  -certain types of chemotherapy, such as daunorubicin, doxorubicin, epirubicin, and idarubicin  This list may not describe all possible interactions. Give your health care provider a list of all the medicines, herbs, non-prescription drugs, or dietary supplements you use. Also tell them if you smoke, drink alcohol, or use illegal drugs. Some items may interact with your medicine.  What should I watch for while using this medicine?  Visit your doctor for checks on your progress. Report any side effects. Continue your course of treatment even though you feel ill unless your doctor tells you to stop.  Call your doctor or health care professional for advice if you get a fever, chills or sore throat, or other symptoms of a cold or flu. Do not treat yourself. Try to avoid being around people who are sick.  You may experience fever, chills and shaking during your first infusion. These effects are usually mild and can be treated with other medicines. Report any side effects during the infusion to your health care professional. Fever and chills usually do not happen with later infusions.  Do not become pregnant while taking this medicine or for 7 months after stopping it. Women should inform their doctor if they wish to become pregnant or think they might be pregnant. Women of child-bearing potential will need to have a negative pregnancy test before starting this medicine. There is a potential for serious side effects to an unborn  child. Talk to your health care professional or pharmacist for more information. Do not breast-feed an infant while taking this medicine or for 7 months after stopping it.  Women must use effective birth control with this medicine.  What side effects may I notice from receiving this medicine?  Side effects that you should report to your doctor or health care professional as soon as possible:  -allergic reactions like skin rash, itching or hives, swelling of the face, lips, or tongue  -chest pain or palpitations  -cough  -dizziness  -feeling faint or lightheaded, falls  -fever  -general ill feeling or flu-like symptoms  -signs of worsening heart failure like breathing problems; swelling in your legs and feet  -unusually weak or tired  Side effects that usually do not

## 2018-06-24 NOTE — Telephone Encounter (Signed)
Called patient to offer earlier appt. Patient stated that her BP is trending down and she is not as concerned. Her BP yesterday morning was 141/63 and she had a treatment yesterday. She is doing well and stated that if continues to be better she may cancel. Advised patient to continue to monitor pressures and let us know if problems. BP right now is 141/74. After talking with patient she would like to come in for OV with Dr. Nicki Reaper. I have moved appt up to Thursday at 930

## 2018-06-26 ENCOUNTER — Ambulatory Visit: Payer: Medicare HMO | Admitting: Internal Medicine

## 2018-06-27 ENCOUNTER — Ambulatory Visit: Payer: Medicare HMO | Admitting: Internal Medicine

## 2018-07-01 ENCOUNTER — Inpatient Hospital Stay (HOSPITAL_BASED_OUTPATIENT_CLINIC_OR_DEPARTMENT_OTHER): Payer: Medicare HMO | Admitting: Internal Medicine

## 2018-07-01 ENCOUNTER — Inpatient Hospital Stay: Payer: Medicare HMO

## 2018-07-01 ENCOUNTER — Encounter: Payer: Self-pay | Admitting: Internal Medicine

## 2018-07-01 ENCOUNTER — Other Ambulatory Visit: Payer: Self-pay

## 2018-07-01 VITALS — BP 148/63 | HR 71 | Temp 97.6°F | Resp 18

## 2018-07-01 DIAGNOSIS — C50411 Malignant neoplasm of upper-outer quadrant of right female breast: Secondary | ICD-10-CM | POA: Diagnosis not present

## 2018-07-01 DIAGNOSIS — Z8543 Personal history of malignant neoplasm of ovary: Secondary | ICD-10-CM

## 2018-07-01 DIAGNOSIS — G620 Drug-induced polyneuropathy: Secondary | ICD-10-CM

## 2018-07-01 DIAGNOSIS — Z5112 Encounter for antineoplastic immunotherapy: Secondary | ICD-10-CM | POA: Diagnosis not present

## 2018-07-01 DIAGNOSIS — Z170 Estrogen receptor positive status [ER+]: Secondary | ICD-10-CM

## 2018-07-01 LAB — CBC WITH DIFFERENTIAL/PLATELET
Abs Immature Granulocytes: 0.03 10*3/uL (ref 0.00–0.07)
Basophils Absolute: 0.1 10*3/uL (ref 0.0–0.1)
Basophils Relative: 1 %
Eosinophils Absolute: 0.1 10*3/uL (ref 0.0–0.5)
Eosinophils Relative: 1 %
HEMATOCRIT: 35.1 % — AB (ref 36.0–46.0)
Hemoglobin: 11.3 g/dL — ABNORMAL LOW (ref 12.0–15.0)
Immature Granulocytes: 0 %
Lymphocytes Relative: 24 %
Lymphs Abs: 1.6 10*3/uL (ref 0.7–4.0)
MCH: 30.9 pg (ref 26.0–34.0)
MCHC: 32.2 g/dL (ref 30.0–36.0)
MCV: 95.9 fL (ref 80.0–100.0)
Monocytes Absolute: 0.6 10*3/uL (ref 0.1–1.0)
Monocytes Relative: 9 %
Neutro Abs: 4.5 10*3/uL (ref 1.7–7.7)
Neutrophils Relative %: 65 %
Platelets: 316 10*3/uL (ref 150–400)
RBC: 3.66 MIL/uL — ABNORMAL LOW (ref 3.87–5.11)
RDW: 14.4 % (ref 11.5–15.5)
WBC: 6.9 10*3/uL (ref 4.0–10.5)
nRBC: 0 % (ref 0.0–0.2)

## 2018-07-01 LAB — COMPREHENSIVE METABOLIC PANEL
ALT: 28 U/L (ref 0–44)
AST: 27 U/L (ref 15–41)
Albumin: 3.4 g/dL — ABNORMAL LOW (ref 3.5–5.0)
Alkaline Phosphatase: 98 U/L (ref 38–126)
Anion gap: 7 (ref 5–15)
BUN: 14 mg/dL (ref 8–23)
CO2: 24 mmol/L (ref 22–32)
Calcium: 8.4 mg/dL — ABNORMAL LOW (ref 8.9–10.3)
Chloride: 108 mmol/L (ref 98–111)
Creatinine, Ser: 0.64 mg/dL (ref 0.44–1.00)
GFR calc non Af Amer: >60 mL/min (ref >60)
Glucose, Bld: 112 mg/dL — ABNORMAL HIGH (ref 70–99)
Potassium: 4 mmol/L (ref 3.5–5.1)
Sodium: 139 mmol/L (ref 135–145)
Total Bilirubin: 0.5 mg/dL (ref 0.3–1.2)
Total Protein: 5.7 g/dL — ABNORMAL LOW (ref 6.5–8.1)

## 2018-07-01 MED ORDER — HEPARIN SOD (PORK) LOCK FLUSH 100 UNIT/ML IV SOLN
500.0000 [IU] | Freq: Once | INTRAVENOUS | Status: AC | PRN
  Administered 2018-07-01: 500 [IU]
  Filled 2018-07-01: qty 5

## 2018-07-01 MED ORDER — SODIUM CHLORIDE 0.9 % IV SOLN
20.0000 mg | Freq: Once | INTRAVENOUS | Status: AC
  Administered 2018-07-01: 20 mg via INTRAVENOUS
  Filled 2018-07-01: qty 2

## 2018-07-01 MED ORDER — SODIUM CHLORIDE 0.9 % IV SOLN
Freq: Once | INTRAVENOUS | Status: AC
  Administered 2018-07-01: 09:00:00 via INTRAVENOUS
  Filled 2018-07-01: qty 250

## 2018-07-01 MED ORDER — DIPHENHYDRAMINE HCL 50 MG/ML IJ SOLN
50.0000 mg | Freq: Once | INTRAMUSCULAR | Status: AC
  Administered 2018-07-01: 50 mg via INTRAVENOUS
  Filled 2018-07-01: qty 1

## 2018-07-01 MED ORDER — TRASTUZUMAB CHEMO 150 MG IV SOLR
150.0000 mg | Freq: Once | INTRAVENOUS | Status: AC
  Administered 2018-07-01: 150 mg via INTRAVENOUS
  Filled 2018-07-01: qty 7.14

## 2018-07-01 MED ORDER — ACETAMINOPHEN 325 MG PO TABS
650.0000 mg | ORAL_TABLET | Freq: Once | ORAL | Status: AC
  Administered 2018-07-01: 650 mg via ORAL
  Filled 2018-07-01: qty 2

## 2018-07-01 MED ORDER — FAMOTIDINE IN NACL 20-0.9 MG/50ML-% IV SOLN
20.0000 mg | Freq: Once | INTRAVENOUS | Status: AC
  Administered 2018-07-01: 20 mg via INTRAVENOUS
  Filled 2018-07-01: qty 50

## 2018-07-01 MED ORDER — SODIUM CHLORIDE 0.9% FLUSH
10.0000 mL | INTRAVENOUS | Status: DC | PRN
  Administered 2018-07-01: 10 mL
  Filled 2018-07-01: qty 10

## 2018-07-01 MED ORDER — SODIUM CHLORIDE 0.9 % IV SOLN
80.0000 mg/m2 | Freq: Once | INTRAVENOUS | Status: AC
  Administered 2018-07-01: 150 mg via INTRAVENOUS
  Filled 2018-07-01: qty 25

## 2018-07-01 NOTE — Progress Notes (Signed)
[place] CONSULT NOTE  Patient Care Team: Einar Pheasant, MD as PCP - General (Internal Medicine) Caryl Bis Angela Adam, MD as Consulting Physician (Family Medicine) Christene Lye, MD (General Surgery) Einar Pheasant, MD (Internal Medicine) Bary Castilla, Forest Gleason, MD (General Surgery) Schnier, Dolores Lory, MD as Consulting Physician (Vascular Surgery)  CHIEF COMPLAINTS/PURPOSE OF CONSULTATION:    #  Oncology History   # OCT 2019- RUOQ IMC-stage I [lumpectomy sentinel lymph node biopsy [facility]; T=0.6 cm; N=0] ER/PR-POSITIVE;her 2 neu-AMPLIFIED [2.28]  # Dec 10th 2019- Taxol-Herceptin weekly x12.   # Oct 2nd EF-55-60% [[facility]]  # Ovarian cancer 2002; s/p carboTaxol x6 [[place]];  Ovarian cancer treated in 2003, she underwent exploratory laparotomy, TAH/BSO, omentectomy, peritoneal biopsies, selective pelvic and periaortic lymph node sampling in [place] for a Grade 1 papillary serous carcinoma of the ovary on June 20, 2001. Last tine she saw GYN oncology was 2011 (Dr. Erasmo Leventhal)  # Genetic testing? [[facility]]  DIAGNOSIS: Breast cancer  STAGE:  I  ;GOALS: cure  CURRENT/MOST RECENT THERAPY: Taxol+herceptin      Carcinoma of upper-outer quadrant of right breast in female, estrogen receptor positive ([place])      HISTORY OF PRESENTING ILLNESS:  [name] [name] 85 y.o.  female with stage I ER PR positive HER-2/neu amplified breast cancer is here for follow-up/currently on Taxol Herceptin weekly.  Complains of hair loss.  Mild intermittent tingling and numbness in the feet.  Currently none.  No significant nausea vomiting.  Complains of bloody mucus ongoing the nose.  Review of Systems  Constitutional: Negative for chills, diaphoresis, fever, malaise/fatigue and weight loss.  HENT: Negative for nosebleeds and sore throat.   Eyes: Negative for double vision.  Respiratory: Negative for cough, hemoptysis, sputum production, shortness of breath and wheezing.    Cardiovascular: Negative for chest pain, palpitations, orthopnea and leg swelling.  Gastrointestinal: Negative for abdominal pain, blood in stool, constipation, diarrhea, heartburn, melena, nausea and vomiting.  Genitourinary: Negative for dysuria, frequency and urgency.  Musculoskeletal: Negative for back pain and joint pain.  Skin: Negative.  Negative for itching and rash.  Neurological: Negative for dizziness, tingling, focal weakness, weakness and headaches.       Mild numbness in the feet.  Endo/Heme/Allergies: Does not bruise/bleed easily.  Psychiatric/Behavioral: Negative for depression. The patient is not nervous/anxious and does not have insomnia.      MEDICAL HISTORY:  Past Medical History:  Diagnosis Date  . Diverticulitis of sigmoid colon   . Endometriosis   . Fibrocystic breast disease   . Glaucoma   . H/O urinary incontinence   . History of chicken pox   . History of colon polyps 2014  . Hyperlipidemia   . Hypertension   . Ovarian cancer ([place]) 2002   Chemo tx's @ [facility] with Total Hysterectomy. Dr Amalia Hailey  . Personal history of chemotherapy    ovarian cancer  . Port-A-Cath in place   . Sinusitis   . Tuberculosis    positve skin test    SURGICAL HISTORY: Past Surgical History:  Procedure Laterality Date  . ABDOMINAL HYSTERECTOMY  2003   complete Dr Amalia Hailey  . ABSCESS DRAINAGE  2017   [facility] buttock [facility]  . APPENDECTOMY  2003  . BREAST EXCISIONAL BIOPSY Left 1993   excisional bx. negative results  . BREAST SURGERY Left 1982   papilloma removal  . COLONOSCOPY  2006, 2014   Dr Vira Agar   . COLONOSCOPY N/A 03/27/2016   Procedure: COLONOSCOPY;  Surgeon: Forest Gleason  Bary Castilla, MD;  Location: [facility] ENDOSCOPY;  Service: Endoscopy;  Laterality: N/A;  . COLOSTOMY REVERSAL  2014   at [place] Dr Raynaldo Opitz  . NASAL SINUS SURGERY     For fungal infection  . NASAL SINUS SURGERY  8/05 8/06  . OOPHORECTOMY    . OSTOMY  05/22/2012   diverticulitis with abcess/ Dr Tamala Julian  .  ovarian cancer     Exploratory lap  . PORTA CATH INSERTION N/A 05/26/2018   Procedure: PORTA CATH INSERTION;  Surgeon: Algernon Huxley, MD;  Location: [place] [facility];  Service: Cardiovascular;  Laterality: N/A;  . TONSILLECTOMY AND ADENOIDECTOMY  1947    SOCIAL HISTORY: Social History   Socioeconomic History  . Marital status: Widowed    Spouse name: Not on file  . Number of children: 0  . Years of education: Not on file  . Highest education level: Not on file  Occupational History  . Not on file  Social Needs  . Financial resource strain: Not hard at all  . Food insecurity:    Worry: Never true    Inability: Never true  . Transportation needs:    Medical: No    Non-medical: No  Tobacco Use  . Smoking status: Never Smoker  . Smokeless tobacco: Never Used  Substance and Sexual Activity  . Alcohol use: No    Alcohol/week: 0.0 standard drinks  . Drug use: No  . Sexual activity: Not Currently  Lifestyle  . Physical activity:    Days per week: Not on file    Minutes per session: Not on file  . Stress: Not on file  Relationships  . Social connections:    Talks on phone: Not on file    Gets together: Not on file    Attends religious service: Not on file    Active member of club or organization: Not on file    Attends meetings of clubs or organizations: Not on file    Relationship status: Not on file  . Intimate partner violence:    Fear of current or ex partner: Not on file    Emotionally abused: Not on file    Physically abused: Not on file    Forced sexual activity: Not on file  Other Topics Concern  . Not on file  Social History Narrative  . Not on file    FAMILY HISTORY: Family History  Problem Relation Age of Onset  . Hypertension Mother   . Coronary artery disease Mother   . Heart disease Mother   . Kidney disease Mother   . Diabetes Mother   . Cancer Mother        uterine cancer  . Diabetes Father   . Kidney failure Father   . Diabetes Brother    . Coronary artery disease Brother   . Cancer Brother        bladder cancer  . Stroke Sister   . Heart disease Sister        [facility]  . Cancer Maternal Aunt        ovarian cancer  . Breast cancer Cousin   . Cancer Brother        Prostate  . Breast cancer Cousin   . Breast cancer Cousin     ALLERGIES:  has No Known Allergies.  MEDICATIONS:  Current Outpatient Medications  Medication Sig Dispense Refill  . amLODipine (NORVASC) 2.5 MG tablet Take 1 tablet (2.5 mg total) by mouth daily. 30 tablet 2  .  Calcium Carbonate-Vitamin D 600-400 MG-UNIT tablet Take 1 tablet by mouth daily.     Marland Kitchen lidocaine-prilocaine (EMLA) cream Apply 1 application topically as needed. 30 g 5  . ondansetron (ZOFRAN) 8 MG tablet Take 1 tablet (8 mg total) by mouth every 8 (eight) hours as needed for nausea or vomiting. (Patient not taking: Reported on 06/17/2018) 20 tablet 3  . prochlorperazine (COMPAZINE) 10 MG tablet Take 1 tablet (10 mg total) by mouth every 6 (six) hours as needed for nausea or vomiting. (Patient not taking: Reported on 06/17/2018) 30 tablet 3   Current Facility-Administered Medications  Medication Dose Route Frequency Provider Last Rate Last Dose  . lidocaine-prilocaine (EMLA) cream   Topical PRN Lucky Cowboy, Erskine Squibb, MD       Facility-Administered Medications Ordered in Other Visits  Medication Dose Route Frequency Provider Last Rate Last Dose  . dexamethasone (DECADRON) 20 mg in sodium chloride 0.9 % 50 mL IVPB  20 mg Intravenous Once Charlaine Dalton R, MD      . famotidine (PEPCID) IVPB 20 mg premix  20 mg Intravenous Once Charlaine Dalton R, MD      . heparin lock flush 100 unit/mL  500 Units Intracatheter Once PRN Cammie Sickle, MD      . PACLitaxel (TAXOL) 150 mg in sodium chloride 0.9 % 250 mL chemo infusion (</= 32m/m2)  80 mg/m2 (Order-Specific) Intravenous Once BCharlaine DaltonR, MD      . sodium chloride 0.9 % 50 mL with famotidine (PEPCID) 20 mg infusion  20 mg  Intravenous Once BCharlaine DaltonR, MD      . sodium chloride flush (NS) 0.9 % injection 10 mL  10 mL Intravenous PRN BCammie Sickle MD   10 mL at 06/24/18 0854  . sodium chloride flush (NS) 0.9 % injection 10 mL  10 mL Intracatheter PRN BCammie Sickle MD   10 mL at 07/01/18 0830  . trastuzumab (HERCEPTIN) 150 mg in sodium chloride 0.9 % 250 mL chemo infusion  150 mg Intravenous Once BCharlaine DaltonR, MD          .  PHYSICAL EXAMINATION: ECOG PERFORMANCE STATUS: 0 - Asymptomatic  Vitals:   07/01/18 0843  BP: (!) 132/59  Pulse: 70  Resp: 18  Temp: 98.3 F (36.8 C)   Filed Weights   07/01/18 0843  Weight: 165 lb (74.8 kg)    Physical Exam  Constitutional: She is oriented to person, place, and time and well-developed, well-nourished, and in no distress.  She is accompanied by a friend.  She is walking herself.  HENT:  Head: Normocephalic and atraumatic.  Mouth/Throat: Oropharynx is clear and moist. No oropharyngeal exudate.  Eyes: Pupils are equal, round, and reactive to light.  Neck: Normal range of motion. Neck supple.  Cardiovascular: Normal rate and regular rhythm.  Pulmonary/Chest: No respiratory distress. She has no wheezes.  Abdominal: Soft. Bowel sounds are normal. She exhibits no distension and no mass. There is no abdominal tenderness. There is no rebound and no guarding.  Musculoskeletal: Normal range of motion.        General: No tenderness or edema.  Neurological: She is alert and oriented to person, place, and time.  Skin: Skin is warm.  Psychiatric: Affect normal.     LABORATORY DATA:  I have reviewed the data as listed Lab Results  Component Value Date   WBC 6.9 07/01/2018   HGB 11.3 (L) 07/01/2018   HCT 35.1 (L) 07/01/2018   MCV 95.9 07/01/2018  PLT 316 07/01/2018   Recent Labs    01/21/18 0840  06/17/18 0834 06/24/18 0845 07/01/18 0832  NA 140   < > 140 140 139  K 4.3   < > 3.6 4.2 4.0  CL 107   < > 107 107 108   CO2 29   < > _0 GLUCOSE 97   < > 125* 92 112*  BUN 14   < > _1 CREATININE 0.65   < > 0.64 0.63 0.64  CALCIUM 9.1   < > 8.4* 8.7* 8.4*  GFRNONAA  --    < > >60 >60 >60  GFRAA  --    < > >60 >60 >60  PROT 6.4   < > 5.9* 6.0* 5.7*  ALBUMIN 3.6   < > 3.3* 3.4* 3.4*  AST 15   < > _2 ALT 14   < > _3 ALKPHOS 99   < > 104 99 98  BILITOT 0.6   < > 0.5 0.4 0.5  BILIDIR 0.1  --   --   --   --    < > = values in this interval not displayed.    RADIOGRAPHIC STUDIES: I have personally reviewed the radiological images as listed and agreed with the findings in the report. No results found.  ASSESSMENT & PLAN:   Carcinoma of upper-outer quadrant of right breast in female, estrogen receptor positive ([place]) # Stage I ER/PR-pos; Her 2 Neu amplified breast cancer.  Currently on adjuvant chemo-taxol-Herceptin.  # proceed with Taxol-herpcetin cycle number 2-day 15 today.  Labs today reviewed;  acceptable for treatment today.   # Elevated Blood pressure-improved after adding Norvasc 2.5 mg once a day.continue norvasc for now.   # Mild hypocalcemia- recommend take ca+vit D BID.   #Fhx of cancer- No children; 2 brothers- cancers [oldest brother- bladder; other brother- prostate cancer ]; mom-? Gynecologic;mom' sister-died of ovarian cancer. ++ Awaiting genetics evaluation.  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  Stable. # DISPOSITION: # Treatment today # in 1 week-cbc/bmp/taxol-herceptin # in 2 week-MD-cbc/cmp/taxol-herceptin  All questions were answered. The patient knows to call the clinic with any problems, questions or concerns.    Cammie Sickle, MD 07/01/2018 9:27 AM

## 2018-07-01 NOTE — Assessment & Plan Note (Addendum)
#   Stage I ER/PR-pos; Her 2 Neu amplified breast cancer.  Currently on adjuvant chemo-taxol-Herceptin.  # proceed with Taxol-herpcetin cycle number 2-day 15 today.  Labs today reviewed;  acceptable for treatment today.   # Elevated Blood pressure-improved after adding Norvasc 2.5 mg once a day.continue norvasc for now.   # Mild hypocalcemia- recommend take ca+vit D BID.   #Fhx of cancer- No children; 2 brothers- cancers [oldest brother- bladder; other brother- prostate cancer ]; mom-? Gynecologic;mom' sister-died of ovarian cancer. ++ Awaiting genetics evaluation.  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  Stable. # DISPOSITION: # Treatment today # in 1 week-cbc/bmp/taxol-herceptin # in 2 week-MD-cbc/cmp/taxol-herceptin

## 2018-07-08 ENCOUNTER — Inpatient Hospital Stay: Payer: Medicare HMO

## 2018-07-08 ENCOUNTER — Ambulatory Visit: Payer: Medicare HMO

## 2018-07-08 ENCOUNTER — Ambulatory Visit: Payer: Medicare HMO | Admitting: Internal Medicine

## 2018-07-08 ENCOUNTER — Other Ambulatory Visit: Payer: Medicare HMO

## 2018-07-08 DIAGNOSIS — Z170 Estrogen receptor positive status [ER+]: Principal | ICD-10-CM

## 2018-07-08 DIAGNOSIS — Z5112 Encounter for antineoplastic immunotherapy: Secondary | ICD-10-CM | POA: Diagnosis not present

## 2018-07-08 DIAGNOSIS — C50411 Malignant neoplasm of upper-outer quadrant of right female breast: Secondary | ICD-10-CM

## 2018-07-08 LAB — CBC WITH DIFFERENTIAL/PLATELET
Abs Immature Granulocytes: 0.04 10*3/uL (ref 0.00–0.07)
Basophils Absolute: 0.1 10*3/uL (ref 0.0–0.1)
Basophils Relative: 1 %
Eosinophils Absolute: 0.1 10*3/uL (ref 0.0–0.5)
Eosinophils Relative: 1 %
HCT: 33 % — ABNORMAL LOW (ref 36.0–46.0)
Hemoglobin: 10.7 g/dL — ABNORMAL LOW (ref 12.0–15.0)
IMMATURE GRANULOCYTES: 1 %
LYMPHS PCT: 26 %
Lymphs Abs: 2.2 10*3/uL (ref 0.7–4.0)
MCH: 31.3 pg (ref 26.0–34.0)
MCHC: 32.4 g/dL (ref 30.0–36.0)
MCV: 96.5 fL (ref 80.0–100.0)
Monocytes Absolute: 0.8 10*3/uL (ref 0.1–1.0)
Monocytes Relative: 10 %
NEUTROS ABS: 5.4 10*3/uL (ref 1.7–7.7)
NEUTROS PCT: 61 %
Platelets: 313 10*3/uL (ref 150–400)
RBC: 3.42 MIL/uL — ABNORMAL LOW (ref 3.87–5.11)
RDW: 15 % (ref 11.5–15.5)
WBC: 8.7 10*3/uL (ref 4.0–10.5)
nRBC: 0 % (ref 0.0–0.2)

## 2018-07-08 LAB — BASIC METABOLIC PANEL
Anion gap: 8 (ref 5–15)
BUN: 18 mg/dL (ref 8–23)
CO2: 24 mmol/L (ref 22–32)
Calcium: 8.5 mg/dL — ABNORMAL LOW (ref 8.9–10.3)
Chloride: 106 mmol/L (ref 98–111)
Creatinine, Ser: 0.68 mg/dL (ref 0.44–1.00)
GFR calc Af Amer: >60 mL/min (ref >60)
GFR calc non Af Amer: >60 mL/min (ref >60)
Glucose, Bld: 94 mg/dL (ref 70–99)
Potassium: 4.2 mmol/L (ref 3.5–5.1)
SODIUM: 138 mmol/L (ref 135–145)

## 2018-07-08 MED ORDER — HEPARIN SOD (PORK) LOCK FLUSH 100 UNIT/ML IV SOLN
500.0000 [IU] | Freq: Once | INTRAVENOUS | Status: AC
  Administered 2018-07-08: 500 [IU] via INTRAVENOUS

## 2018-07-08 MED ORDER — TRASTUZUMAB CHEMO 150 MG IV SOLR
150.0000 mg | Freq: Once | INTRAVENOUS | Status: AC
  Administered 2018-07-08: 150 mg via INTRAVENOUS
  Filled 2018-07-08: qty 7.14

## 2018-07-08 MED ORDER — DIPHENHYDRAMINE HCL 50 MG/ML IJ SOLN
50.0000 mg | Freq: Once | INTRAMUSCULAR | Status: AC
  Administered 2018-07-08: 50 mg via INTRAVENOUS
  Filled 2018-07-08: qty 1

## 2018-07-08 MED ORDER — SODIUM CHLORIDE 0.9 % IV SOLN
20.0000 mg | Freq: Once | INTRAVENOUS | Status: AC
  Administered 2018-07-08: 20 mg via INTRAVENOUS
  Filled 2018-07-08: qty 2

## 2018-07-08 MED ORDER — ACETAMINOPHEN 325 MG PO TABS
650.0000 mg | ORAL_TABLET | Freq: Once | ORAL | Status: AC
  Administered 2018-07-08: 650 mg via ORAL
  Filled 2018-07-08: qty 2

## 2018-07-08 MED ORDER — FAMOTIDINE IN NACL 20-0.9 MG/50ML-% IV SOLN
20.0000 mg | Freq: Once | INTRAVENOUS | Status: AC
  Administered 2018-07-08: 20 mg via INTRAVENOUS
  Filled 2018-07-08: qty 50

## 2018-07-08 MED ORDER — SODIUM CHLORIDE 0.9 % IV SOLN
80.0000 mg/m2 | Freq: Once | INTRAVENOUS | Status: AC
  Administered 2018-07-08: 150 mg via INTRAVENOUS
  Filled 2018-07-08: qty 25

## 2018-07-08 MED ORDER — SODIUM CHLORIDE 0.9 % IV SOLN
Freq: Once | INTRAVENOUS | Status: AC
  Administered 2018-07-08: 09:00:00 via INTRAVENOUS
  Filled 2018-07-08: qty 250

## 2018-07-08 MED ORDER — HEPARIN SOD (PORK) LOCK FLUSH 100 UNIT/ML IV SOLN
INTRAVENOUS | Status: AC
  Filled 2018-07-08: qty 5

## 2018-07-08 MED ORDER — SODIUM CHLORIDE 0.9% FLUSH
10.0000 mL | Freq: Once | INTRAVENOUS | Status: AC
  Administered 2018-07-08: 10 mL via INTRAVENOUS
  Filled 2018-07-08: qty 10

## 2018-07-15 ENCOUNTER — Inpatient Hospital Stay: Payer: Medicare HMO | Attending: Internal Medicine | Admitting: Internal Medicine

## 2018-07-15 ENCOUNTER — Inpatient Hospital Stay: Payer: Medicare HMO

## 2018-07-15 ENCOUNTER — Encounter: Payer: Self-pay | Admitting: Internal Medicine

## 2018-07-15 ENCOUNTER — Other Ambulatory Visit: Payer: Self-pay

## 2018-07-15 VITALS — BP 155/70 | HR 40 | Temp 97.9°F | Resp 20 | Ht 63.0 in | Wt 167.5 lb

## 2018-07-15 VITALS — BP 147/75 | HR 78 | Temp 97.8°F | Resp 18

## 2018-07-15 DIAGNOSIS — C50411 Malignant neoplasm of upper-outer quadrant of right female breast: Secondary | ICD-10-CM

## 2018-07-15 DIAGNOSIS — R600 Localized edema: Secondary | ICD-10-CM | POA: Diagnosis not present

## 2018-07-15 DIAGNOSIS — Z5111 Encounter for antineoplastic chemotherapy: Secondary | ICD-10-CM | POA: Insufficient documentation

## 2018-07-15 DIAGNOSIS — Z8541 Personal history of malignant neoplasm of cervix uteri: Secondary | ICD-10-CM

## 2018-07-15 DIAGNOSIS — Z170 Estrogen receptor positive status [ER+]: Secondary | ICD-10-CM | POA: Diagnosis not present

## 2018-07-15 DIAGNOSIS — Z8041 Family history of malignant neoplasm of ovary: Secondary | ICD-10-CM | POA: Diagnosis not present

## 2018-07-15 DIAGNOSIS — I10 Essential (primary) hypertension: Secondary | ICD-10-CM | POA: Insufficient documentation

## 2018-07-15 DIAGNOSIS — M810 Age-related osteoporosis without current pathological fracture: Secondary | ICD-10-CM | POA: Diagnosis not present

## 2018-07-15 DIAGNOSIS — G629 Polyneuropathy, unspecified: Secondary | ICD-10-CM | POA: Diagnosis not present

## 2018-07-15 DIAGNOSIS — Z5112 Encounter for antineoplastic immunotherapy: Secondary | ICD-10-CM | POA: Diagnosis not present

## 2018-07-15 LAB — CBC WITH DIFFERENTIAL/PLATELET
Abs Immature Granulocytes: 0.06 10*3/uL (ref 0.00–0.07)
BASOS ABS: 0.1 10*3/uL (ref 0.0–0.1)
Basophils Relative: 1 %
Eosinophils Absolute: 0.1 10*3/uL (ref 0.0–0.5)
Eosinophils Relative: 2 %
HCT: 33.1 % — ABNORMAL LOW (ref 36.0–46.0)
Hemoglobin: 10.8 g/dL — ABNORMAL LOW (ref 12.0–15.0)
Immature Granulocytes: 1 %
Lymphocytes Relative: 23 %
Lymphs Abs: 1.8 10*3/uL (ref 0.7–4.0)
MCH: 31.1 pg (ref 26.0–34.0)
MCHC: 32.6 g/dL (ref 30.0–36.0)
MCV: 95.4 fL (ref 80.0–100.0)
Monocytes Absolute: 0.8 10*3/uL (ref 0.1–1.0)
Monocytes Relative: 11 %
Neutro Abs: 4.9 10*3/uL (ref 1.7–7.7)
Neutrophils Relative %: 62 %
Platelets: 316 10*3/uL (ref 150–400)
RBC: 3.47 MIL/uL — ABNORMAL LOW (ref 3.87–5.11)
RDW: 15 % (ref 11.5–15.5)
WBC: 7.7 10*3/uL (ref 4.0–10.5)
nRBC: 0 % (ref 0.0–0.2)

## 2018-07-15 LAB — COMPREHENSIVE METABOLIC PANEL
ALBUMIN: 3.2 g/dL — AB (ref 3.5–5.0)
ALT: 34 U/L (ref 0–44)
ANION GAP: 8 (ref 5–15)
AST: 20 U/L (ref 15–41)
Alkaline Phosphatase: 94 U/L (ref 38–126)
BUN: 16 mg/dL (ref 8–23)
CO2: 24 mmol/L (ref 22–32)
Calcium: 8.7 mg/dL — ABNORMAL LOW (ref 8.9–10.3)
Chloride: 107 mmol/L (ref 98–111)
Creatinine, Ser: 0.57 mg/dL (ref 0.44–1.00)
GFR calc Af Amer: >60 mL/min (ref >60)
GFR calc non Af Amer: >60 mL/min (ref >60)
GLUCOSE: 120 mg/dL — AB (ref 70–99)
Potassium: 3.8 mmol/L (ref 3.5–5.1)
Sodium: 139 mmol/L (ref 135–145)
Total Bilirubin: 0.8 mg/dL (ref 0.3–1.2)
Total Protein: 6.1 g/dL — ABNORMAL LOW (ref 6.5–8.1)

## 2018-07-15 MED ORDER — SODIUM CHLORIDE 0.9% FLUSH
10.0000 mL | INTRAVENOUS | Status: DC | PRN
  Administered 2018-07-15: 10 mL
  Filled 2018-07-15: qty 10

## 2018-07-15 MED ORDER — ACETAMINOPHEN 325 MG PO TABS
650.0000 mg | ORAL_TABLET | Freq: Once | ORAL | Status: AC
  Administered 2018-07-15: 650 mg via ORAL
  Filled 2018-07-15: qty 2

## 2018-07-15 MED ORDER — SODIUM CHLORIDE 0.9 % IV SOLN
20.0000 mg | Freq: Once | INTRAVENOUS | Status: AC
  Administered 2018-07-15: 20 mg via INTRAVENOUS
  Filled 2018-07-15: qty 2

## 2018-07-15 MED ORDER — FAMOTIDINE IN NACL 20-0.9 MG/50ML-% IV SOLN
20.0000 mg | Freq: Once | INTRAVENOUS | Status: AC
  Administered 2018-07-15: 20 mg via INTRAVENOUS
  Filled 2018-07-15: qty 50

## 2018-07-15 MED ORDER — SODIUM CHLORIDE 0.9 % IV SOLN
80.0000 mg/m2 | Freq: Once | INTRAVENOUS | Status: AC
  Administered 2018-07-15: 150 mg via INTRAVENOUS
  Filled 2018-07-15: qty 25

## 2018-07-15 MED ORDER — SODIUM CHLORIDE 0.9 % IV SOLN
Freq: Once | INTRAVENOUS | Status: AC
  Administered 2018-07-15: 10:00:00 via INTRAVENOUS
  Filled 2018-07-15: qty 250

## 2018-07-15 MED ORDER — DIPHENHYDRAMINE HCL 50 MG/ML IJ SOLN
50.0000 mg | Freq: Once | INTRAMUSCULAR | Status: AC
  Administered 2018-07-15: 50 mg via INTRAVENOUS
  Filled 2018-07-15: qty 1

## 2018-07-15 MED ORDER — HEPARIN SOD (PORK) LOCK FLUSH 100 UNIT/ML IV SOLN
500.0000 [IU] | Freq: Once | INTRAVENOUS | Status: AC | PRN
  Administered 2018-07-15: 500 [IU]
  Filled 2018-07-15: qty 5

## 2018-07-15 MED ORDER — TRASTUZUMAB CHEMO 150 MG IV SOLR
150.0000 mg | Freq: Once | INTRAVENOUS | Status: AC
  Administered 2018-07-15: 150 mg via INTRAVENOUS
  Filled 2018-07-15: qty 7.14

## 2018-07-15 NOTE — Addendum Note (Signed)
Addended by: Sandria Bales B on: 07/15/2018 02:48 PM   Modules accepted: Orders

## 2018-07-15 NOTE — Progress Notes (Signed)
[name] CONSULT NOTE  Patient Care Team: Einar Pheasant, MD as PCP - General (Internal Medicine) Caryl Bis Angela Adam, MD as Consulting Physician (Family Medicine) Christene Lye, MD (General Surgery) Einar Pheasant, MD (Internal Medicine) Bary Castilla, Forest Gleason, MD (General Surgery) Schnier, Dolores Lory, MD as Consulting Physician (Vascular Surgery)  CHIEF COMPLAINTS/PURPOSE OF CONSULTATION:    #  Oncology History   # OCT 2019- RUOQ IMC-stage I [lumpectomy sentinel lymph node biopsy [facility]; T=0.6 cm; N=0] ER/PR-POSITIVE;her 2 neu-AMPLIFIED [2.28]  # Dec 10th 2019- Taxol-Herceptin weekly x12.   # Oct 2nd EF-55-60% [[facility]]  # Ovarian cancer 2002; s/p carboTaxol x6 [[place]];  Ovarian cancer treated in 2003, she underwent exploratory laparotomy, TAH/BSO, omentectomy, peritoneal biopsies, selective pelvic and periaortic lymph node sampling in [place] for a Grade 1 papillary serous carcinoma of the ovary on June 20, 2001. Last tine she saw GYN oncology was 2011 (Dr. Erasmo Leventhal)  # Genetic testing/ #Fhx of cancer- No children; 2 brothers- cancers [oldest brother- bladder; other brother- prostate cancer ]; mom-? Gynecologic;mom' sister-died of ovarian cancer. Awaiting genetics evaluation.  DIAGNOSIS: Breast cancer  STAGE:  I  ;GOALS: cure  CURRENT/MOST RECENT THERAPY: Taxol+herceptin      Carcinoma of upper-outer quadrant of right breast in female, estrogen receptor positive ([place])      HISTORY OF PRESENTING ILLNESS:  [name] [name] 85 y.o.  female with stage I ER PR positive HER-2/neu amplified breast cancer is here for follow-up/currently on Taxol Herceptin weekly.  Complains of mild tingling and numbness in the feet.  None in the hands.  No tripping or falling.  Complains of mild fatigue.  Complains of mild swelling in the legs.  No shortness of breath.  Review of Systems  Constitutional: Positive for malaise/fatigue. Negative for chills,  diaphoresis, fever and weight loss.  HENT: Negative for nosebleeds and sore throat.   Eyes: Negative for double vision.  Respiratory: Negative for cough, hemoptysis, sputum production, shortness of breath and wheezing.   Cardiovascular: Positive for leg swelling. Negative for chest pain, palpitations and orthopnea.  Gastrointestinal: Negative for abdominal pain, blood in stool, constipation, diarrhea, heartburn, melena, nausea and vomiting.  Genitourinary: Negative for dysuria, frequency and urgency.  Musculoskeletal: Negative for back pain and joint pain.  Skin: Negative.  Negative for itching and rash.  Neurological: Negative for dizziness, tingling, focal weakness, weakness and headaches.       Mild numbness in the feet.  Endo/Heme/Allergies: Does not bruise/bleed easily.  Psychiatric/Behavioral: Negative for depression. The patient is not nervous/anxious and does not have insomnia.      MEDICAL HISTORY:  Past Medical History:  Diagnosis Date  . Diverticulitis of sigmoid colon   . Endometriosis   . Fibrocystic breast disease   . Glaucoma   . H/O urinary incontinence   . History of chicken pox   . History of colon polyps 2014  . Hyperlipidemia   . Hypertension   . Ovarian cancer ([place]) 2002   Chemo tx's @ [facility] with Total Hysterectomy. Dr Amalia Hailey  . Personal history of chemotherapy    ovarian cancer  . Port-A-Cath in place   . Sinusitis   . Tuberculosis    positve skin test    SURGICAL HISTORY: Past Surgical History:  Procedure Laterality Date  . ABDOMINAL HYSTERECTOMY  2003   complete Dr Amalia Hailey  . ABSCESS DRAINAGE  2017   right buttock at [facility]  . APPENDECTOMY  2003  . BREAST EXCISIONAL BIOPSY Left 1993   excisional bx.  negative results  . BREAST SURGERY Left 1982   papilloma removal  . COLONOSCOPY  2006, 2014   Dr Vira Agar   . COLONOSCOPY N/A 03/27/2016   Procedure: COLONOSCOPY;  Surgeon: Robert Bellow, MD;  Location: [facility] ENDOSCOPY;  Service: Endoscopy;   Laterality: N/A;  . COLOSTOMY REVERSAL  2014   at [place] Dr Raynaldo Opitz  . NASAL SINUS SURGERY     For fungal infection  . NASAL SINUS SURGERY  8/05 8/06  . OOPHORECTOMY    . OSTOMY  05/22/2012   diverticulitis with abcess/ Dr Tamala Julian  . ovarian cancer     Exploratory lap  . PORTA CATH INSERTION N/A 05/26/2018   Procedure: PORTA CATH INSERTION;  Surgeon: Algernon Huxley, MD;  Location: [place] [facility];  Service: Cardiovascular;  Laterality: N/A;  . TONSILLECTOMY AND ADENOIDECTOMY  1947    SOCIAL HISTORY: Social History   Socioeconomic History  . Marital status: Widowed    Spouse name: Not on file  . Number of children: 0  . Years of education: Not on file  . Highest education level: Not on file  Occupational History  . Not on file  Social Needs  . Financial resource strain: Not hard at all  . Food insecurity:    Worry: Never true    Inability: Never true  . Transportation needs:    Medical: No    Non-medical: No  Tobacco Use  . Smoking status: Never Smoker  . Smokeless tobacco: Never Used  Substance and Sexual Activity  . Alcohol use: No    Alcohol/week: 0.0 standard drinks  . Drug use: No  . Sexual activity: Not Currently  Lifestyle  . Physical activity:    Days per week: Not on file    Minutes per session: Not on file  . Stress: Not on file  Relationships  . Social connections:    Talks on phone: Not on file    Gets together: Not on file    Attends religious service: Not on file    Active member of club or organization: Not on file    Attends meetings of clubs or organizations: Not on file    Relationship status: Not on file  . Intimate partner violence:    Fear of current or ex partner: Not on file    Emotionally abused: Not on file    Physically abused: Not on file    Forced sexual activity: Not on file  Other Topics Concern  . Not on file  Social History Narrative  . Not on file    FAMILY HISTORY: Family History  Problem Relation Age of Onset   . Hypertension Mother   . Coronary artery disease Mother   . Heart disease Mother   . Kidney disease Mother   . Diabetes Mother   . Cancer Mother        uterine cancer  . Diabetes Father   . Kidney failure Father   . Diabetes Brother   . Coronary artery disease Brother   . Cancer Brother        bladder cancer  . Stroke Sister   . Heart disease Sister        [facility]  . Cancer Maternal Aunt        ovarian cancer  . Breast cancer Cousin   . Cancer Brother        Prostate  . Breast cancer Cousin   . Breast cancer Cousin     ALLERGIES:  has No Known Allergies.  MEDICATIONS:  Current Outpatient Medications  Medication Sig Dispense Refill  . amLODipine (NORVASC) 2.5 MG tablet Take 1 tablet (2.5 mg total) by mouth daily. 30 tablet 2  . Calcium Carbonate-Vitamin D 600-400 MG-UNIT tablet Take 1 tablet by mouth daily.     Marland Kitchen lidocaine-prilocaine (EMLA) cream Apply 1 application topically as needed. 30 g 5  . ondansetron (ZOFRAN) 8 MG tablet Take 1 tablet (8 mg total) by mouth every 8 (eight) hours as needed for nausea or vomiting. (Patient not taking: Reported on 06/17/2018) 20 tablet 3  . prochlorperazine (COMPAZINE) 10 MG tablet Take 1 tablet (10 mg total) by mouth every 6 (six) hours as needed for nausea or vomiting. (Patient not taking: Reported on 06/17/2018) 30 tablet 3   Current Facility-Administered Medications  Medication Dose Route Frequency Provider Last Rate Last Dose  . lidocaine-prilocaine (EMLA) cream   Topical PRN Lucky Cowboy, Erskine Squibb, MD       Facility-Administered Medications Ordered in Other Visits  Medication Dose Route Frequency Provider Last Rate Last Dose  . heparin lock flush 100 unit/mL  500 Units Intracatheter Once PRN Cammie Sickle, MD      . PACLitaxel (TAXOL) 150 mg in sodium chloride 0.9 % 250 mL chemo infusion (</= 27m/m2)  80 mg/m2 (Order-Specific) Intravenous Once BCharlaine DaltonR, MD      . sodium chloride 0.9 % 50 mL with famotidine (PEPCID)  20 mg infusion  20 mg Intravenous Once BCharlaine DaltonR, MD      . sodium chloride flush (NS) 0.9 % injection 10 mL  10 mL Intravenous PRN BCammie Sickle MD   10 mL at 06/24/18 0854  . sodium chloride flush (NS) 0.9 % injection 10 mL  10 mL Intracatheter PRN BCammie Sickle MD   10 mL at 07/01/18 0830  . sodium chloride flush (NS) 0.9 % injection 10 mL  10 mL Intracatheter PRN BCammie Sickle MD   10 mL at 07/15/18 0913      .  PHYSICAL EXAMINATION: ECOG PERFORMANCE STATUS: 0 - Asymptomatic  Vitals:   07/15/18 0924  BP: (!) 155/70  Pulse: (!) 40  Resp: 20  Temp: 97.9 F (36.6 C)   Filed Weights   07/15/18 0924  Weight: 167 lb 8.8 oz (76 kg)    Physical Exam  Constitutional: She is oriented to person, place, and time and well-developed, well-nourished, and in no distress.  She is accompanied by a friend.  She is walking herself.  HENT:  Head: Normocephalic and atraumatic.  Mouth/Throat: Oropharynx is clear and moist. No oropharyngeal exudate.  Eyes: Pupils are equal, round, and reactive to light.  Neck: Normal range of motion. Neck supple.  Cardiovascular: Normal rate and regular rhythm.  Pulmonary/Chest: No respiratory distress. She has no wheezes.  Abdominal: Soft. Bowel sounds are normal. She exhibits no distension and no mass. There is no abdominal tenderness. There is no rebound and no guarding.  Musculoskeletal: Normal range of motion.        General: No tenderness or edema (Mild swelling in the legs.  Grade 1.).  Neurological: She is alert and oriented to person, place, and time.  Skin: Skin is warm.  Psychiatric: Affect normal.     LABORATORY DATA:  I have reviewed the data as listed Lab Results  Component Value Date   WBC 7.7 07/15/2018   HGB 10.8 (L) 07/15/2018   HCT 33.1 (L) 07/15/2018   MCV 95.4 07/15/2018  PLT 316 07/15/2018   Recent Labs    01/21/18 0840  06/24/18 0845 07/01/18 0832 07/08/18 0834 07/15/18 0913  NA  140   < > 140 139 138 139  K 4.3   < > 4.2 4.0 4.2 3.8  CL 107   < > 107 108 106 107  CO2 29   < > _0 GLUCOSE 97   < > 92 112* 94 120*  BUN 14   < > _1 CREATININE 0.65   < > 0.63 0.64 0.68 0.57  CALCIUM 9.1   < > 8.7* 8.4* 8.5* 8.7*  GFRNONAA  --    < > >60 >60 >60 >60  GFRAA  --    < > >60 >60 >60 >60  PROT 6.4   < > 6.0* 5.7*  --  6.1*  ALBUMIN 3.6   < > 3.4* 3.4*  --  3.2*  AST 15   < > 21 27  --  20  ALT 14   < > 24 28  --  34  ALKPHOS 99   < > 99 98  --  94  BILITOT 0.6   < > 0.4 0.5  --  0.8  BILIDIR 0.1  --   --   --   --   --    < > = values in this interval not displayed.    RADIOGRAPHIC STUDIES: I have personally reviewed the radiological images as listed and agreed with the findings in the report. No results found.  ASSESSMENT & PLAN:   Carcinoma of upper-outer quadrant of right breast in female, estrogen receptor positive ([place]) # Stage I ER/PR-pos; Her 2 Neu amplified breast cancer. Currently on adjuvant chemo-taxol-Herceptin.  # proceed with Taxol-herpcetin cycle number 3-day 1 today.  Labs today reviewed;  acceptable for treatment today.   #Again reminded the patient that she should call radiation oncology towards the end of February to discuss radiation after finishing Taxol [last treatment end of feb 2020].  Patient will need echo in end of feb 2020.   # Bil LE swelling sec to Taxol/steroids; recommend stockings; clinically  no concerns for DVT.  Question Norvasc.  # Elevated Blood pressure-STABLE; Norvasc 2.5 mg once a day. Continue for now.   #Fhx of cancer- No children; 2 brothers- cancers [oldest brother- bladder; other brother- prostate cancer ]; mom-? Gynecologic;mom' sister-died of ovarian cancer.  Awaiting genetics evaluation.  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  Stable.   # Fatigue- sec to chemo- G-1.  Stable.  # DISPOSITION: # Treatment today # in 1 week- Dr.C-cbc/bmp/taxol-herceptin- Dr.B  All questions were  answered. The patient knows to call the clinic with any problems, questions or concerns.    Cammie Sickle, MD 07/15/2018 12:05 PM

## 2018-07-15 NOTE — Assessment & Plan Note (Addendum)
#   Stage I ER/PR-pos; Her 2 Neu amplified breast cancer. Currently on adjuvant chemo-taxol-Herceptin.  # proceed with Taxol-herpcetin cycle number 3-day 1 today.  Labs today reviewed;  acceptable for treatment today.   #Again reminded the patient that she should call radiation oncology towards the end of February to discuss radiation after finishing Taxol [last treatment end of feb 2020].  Patient will need echo in end of feb 2020.   # Bil LE swelling sec to Taxol/steroids; recommend stockings; clinically  no concerns for DVT.  Question Norvasc.  # Elevated Blood pressure-STABLE; Norvasc 2.5 mg once a day. Continue for now.   #Fhx of cancer- No children; 2 brothers- cancers [oldest brother- bladder; other brother- prostate cancer ]; mom-? Gynecologic;mom' sister-died of ovarian cancer.  Awaiting genetics evaluation.  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  Stable.   # Fatigue- sec to chemo- G-1.  Stable.  # DISPOSITION: # Treatment today # in 1 week- Dr.C-cbc/bmp/taxol-herceptin- Dr.B

## 2018-07-21 ENCOUNTER — Other Ambulatory Visit: Payer: Self-pay

## 2018-07-21 DIAGNOSIS — Z170 Estrogen receptor positive status [ER+]: Principal | ICD-10-CM

## 2018-07-21 DIAGNOSIS — C50411 Malignant neoplasm of upper-outer quadrant of right female breast: Secondary | ICD-10-CM

## 2018-07-22 ENCOUNTER — Ambulatory Visit
Admission: RE | Admit: 2018-07-22 | Discharge: 2018-07-22 | Disposition: A | Discharge status: Home / Self Care | Payer: Medicare HMO | Source: Ambulatory Visit | Attending: Urgent Care | Admitting: Urgent Care

## 2018-07-22 ENCOUNTER — Other Ambulatory Visit: Payer: Self-pay | Admitting: Hematology and Oncology

## 2018-07-22 ENCOUNTER — Encounter: Payer: Self-pay | Admitting: Hematology and Oncology

## 2018-07-22 ENCOUNTER — Inpatient Hospital Stay: Payer: Medicare HMO

## 2018-07-22 ENCOUNTER — Telehealth: Payer: Self-pay | Admitting: Genetics

## 2018-07-22 ENCOUNTER — Inpatient Hospital Stay (HOSPITAL_BASED_OUTPATIENT_CLINIC_OR_DEPARTMENT_OTHER): Payer: Medicare HMO | Admitting: Hematology and Oncology

## 2018-07-22 VITALS — BP 142/45 | HR 64 | Temp 98.0°F | Resp 18 | Ht 63.0 in | Wt 167.1 lb

## 2018-07-22 VITALS — BP 156/76 | HR 56 | Temp 95.2°F | Resp 18

## 2018-07-22 DIAGNOSIS — C50411 Malignant neoplasm of upper-outer quadrant of right female breast: Secondary | ICD-10-CM

## 2018-07-22 DIAGNOSIS — R600 Localized edema: Secondary | ICD-10-CM

## 2018-07-22 DIAGNOSIS — Z8041 Family history of malignant neoplasm of ovary: Secondary | ICD-10-CM

## 2018-07-22 DIAGNOSIS — Z809 Family history of malignant neoplasm, unspecified: Secondary | ICD-10-CM

## 2018-07-22 DIAGNOSIS — G620 Drug-induced polyneuropathy: Secondary | ICD-10-CM

## 2018-07-22 DIAGNOSIS — G629 Polyneuropathy, unspecified: Secondary | ICD-10-CM

## 2018-07-22 DIAGNOSIS — Z9189 Other specified personal risk factors, not elsewhere classified: Secondary | ICD-10-CM

## 2018-07-22 DIAGNOSIS — Z5112 Encounter for antineoplastic immunotherapy: Secondary | ICD-10-CM | POA: Insufficient documentation

## 2018-07-22 DIAGNOSIS — I10 Essential (primary) hypertension: Secondary | ICD-10-CM

## 2018-07-22 DIAGNOSIS — Z170 Estrogen receptor positive status [ER+]: Secondary | ICD-10-CM

## 2018-07-22 DIAGNOSIS — M7989 Other specified soft tissue disorders: Secondary | ICD-10-CM | POA: Insufficient documentation

## 2018-07-22 DIAGNOSIS — T451X5A Adverse effect of antineoplastic and immunosuppressive drugs, initial encounter: Secondary | ICD-10-CM

## 2018-07-22 DIAGNOSIS — Z5111 Encounter for antineoplastic chemotherapy: Secondary | ICD-10-CM | POA: Insufficient documentation

## 2018-07-22 DIAGNOSIS — Z8543 Personal history of malignant neoplasm of ovary: Secondary | ICD-10-CM

## 2018-07-22 LAB — COMPREHENSIVE METABOLIC PANEL
ALT: 24 U/L (ref 0–44)
AST: 23 U/L (ref 15–41)
Albumin: 3.4 g/dL — ABNORMAL LOW (ref 3.5–5.0)
Alkaline Phosphatase: 93 U/L (ref 38–126)
Anion gap: 7 (ref 5–15)
BUN: 14 mg/dL (ref 8–23)
CO2: 26 mmol/L (ref 22–32)
Calcium: 8.8 mg/dL — ABNORMAL LOW (ref 8.9–10.3)
Chloride: 105 mmol/L (ref 98–111)
Creatinine, Ser: 0.54 mg/dL (ref 0.44–1.00)
GFR calc Af Amer: >60 mL/min (ref >60)
GFR calc non Af Amer: >60 mL/min (ref >60)
Glucose, Bld: 100 mg/dL — ABNORMAL HIGH (ref 70–99)
Potassium: 4 mmol/L (ref 3.5–5.1)
Sodium: 138 mmol/L (ref 135–145)
Total Bilirubin: 0.7 mg/dL (ref 0.3–1.2)
Total Protein: 6.3 g/dL — ABNORMAL LOW (ref 6.5–8.1)

## 2018-07-22 LAB — CBC WITH DIFFERENTIAL/PLATELET
Abs Immature Granulocytes: 0.03 10*3/uL (ref 0.00–0.07)
Basophils Absolute: 0.1 10*3/uL (ref 0.0–0.1)
Basophils Relative: 1 %
Eosinophils Absolute: 0.1 10*3/uL (ref 0.0–0.5)
Eosinophils Relative: 2 %
HCT: 33.7 % — ABNORMAL LOW (ref 36.0–46.0)
Hemoglobin: 10.9 g/dL — ABNORMAL LOW (ref 12.0–15.0)
Immature Granulocytes: 1 %
Lymphocytes Relative: 26 %
Lymphs Abs: 1.7 10*3/uL (ref 0.7–4.0)
MCH: 31 pg (ref 26.0–34.0)
MCHC: 32.3 g/dL (ref 30.0–36.0)
MCV: 95.7 fL (ref 80.0–100.0)
Monocytes Absolute: 0.7 10*3/uL (ref 0.1–1.0)
Monocytes Relative: 11 %
Neutro Abs: 3.9 10*3/uL (ref 1.7–7.7)
Neutrophils Relative %: 59 %
Platelets: 324 10*3/uL (ref 150–400)
RBC: 3.52 MIL/uL — ABNORMAL LOW (ref 3.87–5.11)
RDW: 15 % (ref 11.5–15.5)
WBC: 6.6 10*3/uL (ref 4.0–10.5)
nRBC: 0 % (ref 0.0–0.2)

## 2018-07-22 LAB — MAGNESIUM: Magnesium: 2.1 mg/dL (ref 1.7–2.4)

## 2018-07-22 MED ORDER — FAMOTIDINE IN NACL 20-0.9 MG/50ML-% IV SOLN
20.0000 mg | Freq: Once | INTRAVENOUS | Status: AC
  Administered 2018-07-22: 20 mg via INTRAVENOUS
  Filled 2018-07-22: qty 50

## 2018-07-22 MED ORDER — DIPHENHYDRAMINE HCL 50 MG/ML IJ SOLN
50.0000 mg | Freq: Once | INTRAMUSCULAR | Status: AC
  Administered 2018-07-22: 50 mg via INTRAVENOUS
  Filled 2018-07-22: qty 1

## 2018-07-22 MED ORDER — HEPARIN SOD (PORK) LOCK FLUSH 100 UNIT/ML IV SOLN
500.0000 [IU] | Freq: Once | INTRAVENOUS | Status: AC
  Administered 2018-07-22: 500 [IU] via INTRAVENOUS
  Filled 2018-07-22: qty 5

## 2018-07-22 MED ORDER — TRASTUZUMAB CHEMO 150 MG IV SOLR
150.0000 mg | Freq: Once | INTRAVENOUS | Status: AC
  Administered 2018-07-22: 150 mg via INTRAVENOUS
  Filled 2018-07-22: qty 7.14

## 2018-07-22 MED ORDER — SODIUM CHLORIDE 0.9 % IV SOLN
Freq: Once | INTRAVENOUS | Status: AC
  Administered 2018-07-22: 10:00:00 via INTRAVENOUS
  Filled 2018-07-22: qty 250

## 2018-07-22 MED ORDER — SODIUM CHLORIDE 0.9% FLUSH
10.0000 mL | INTRAVENOUS | Status: DC | PRN
  Administered 2018-07-22: 10 mL via INTRAVENOUS
  Filled 2018-07-22: qty 10

## 2018-07-22 MED ORDER — SODIUM CHLORIDE 0.9 % IV SOLN
80.0000 mg/m2 | Freq: Once | INTRAVENOUS | Status: AC
  Administered 2018-07-22: 150 mg via INTRAVENOUS
  Filled 2018-07-22: qty 25

## 2018-07-22 MED ORDER — ACETAMINOPHEN 325 MG PO TABS
650.0000 mg | ORAL_TABLET | Freq: Once | ORAL | Status: AC
  Administered 2018-07-22: 650 mg via ORAL
  Filled 2018-07-22: qty 2

## 2018-07-22 MED ORDER — SODIUM CHLORIDE 0.9 % IV SOLN
20.0000 mg | Freq: Once | INTRAVENOUS | Status: AC
  Administered 2018-07-22: 20 mg via INTRAVENOUS
  Filled 2018-07-22: qty 2

## 2018-07-22 NOTE — Progress Notes (Signed)
No new changes noted today 

## 2018-07-22 NOTE — Progress Notes (Signed)
[facility] day:  07/22/2018  Chief Complaint: [name] [name] is a 85 y.o. female with stage I Her2/neu + breast cancer who is seen for new patient assessment and week #10 of Herceptin and Taxol.  HPI:  The patient was diagnosed with ovarian cancer in 2002.  She presented with urinary incontinence.  She underwent abdominal imaging after a colonoscopy.  She underwent exploratory laparotomy, TAH/BSO, omentectomy, peritoneal biopsies, selective pelvic and periaortic lymph node sampling on 06/20/2001.  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  Uterine serosa and the surface of the contralateral ovary were positive for metastatic disease. The primary tumor was approximately 15 cm in diameter. Pathologic stage was IIB.  She received 6 cycles of carboplatin and Taxol in [place], [place].  She was last seen in the GYN oncology clinic by Dr. Gershon Crane on 04/17/2010.  She describes having a mammogram every year.  Screening mammogram on 01/17/2018 revealed calcifications and possible mass in the right breast.  Diagnostic mammogram and ultrasound on 01/22/2018 revealed indeterminate group of microcalcifications over the right upper outer quadrant spanning 4 x 9 x 11 mm.  There was a 4 x 7 x 7 mm probable complicated cyst at the 11 o'clock position of the right breast 5 cm from the nipple.  Right breast biopsy at 11:00 at [facility] on 02/14/2018 revealed grade III invasive adenocarcinoma of the breast.  She underwent lumpectomy and sentinel lymph node biopsy on 03/18/2018 by Dr. Juan Quam at [facility].  Pathology revealed a 0.6 cm grade III invasive mammary carcinoma.  There was DCIS.  There was no lymphovascular invasion.  Tumor was ER + (99%), PR + (25%), and Her2/neu + by FISH.  Pathologic stage was T1bN0.  Echo at [facility] on 03/12/2018 revealed an EF of 61%.  She began weekly Herceptin and Taxol on 05/20/2018.  She has received 9 cycles to  date (last 07/15/2017).  The patient was last seen by Dr. Rogue Bussing on 07/15/2018.  At that time, she complained of mild tingling and numbness in the feet.  She denied any issues with ambulation.  She had no symptoms in her hands.  She was felt to have a grade I neuropathy.  She also noted mild fatigue and swelling in the legs.  She denied any shortness of breath.  She received week #9 Herceptin and Taxol.  Symptomatically, she is tolerating treatment well. She denies any associated side effects. No nausea, vomiting, or changes to her bowel habits. She has lost her hair, which has upset her some. Patient has stable neuropathy in her  feet. Shenotes that her neuropathy does not impede her ability to ambulate safely. She notes chronic peripheral edema (RLE > LLE), for which she uses daily compression stockings. She denies any claudication pain in her lower extremities.   Energy level is "good". Patient denies that she has experienced any B symptoms. She denies any interval infections. She denies shortness of breath and chest pain. She has issues with urinary incontinence.   Patient advises that she maintains an adequate appetite. She is eating well. Weight today is 167 lb 1.7 oz (75.8 kg), which compared to her last visit to the clinic, represents a stable weight.  She denies pain in the clinic today.  She has no children.  She has 2 brothers.  One brother has bladder cancer and the other brother has prostate cancer.  Her mother may have had a GYN malignancy.  Her  maternal aunt had ovarian cancer.   Past Medical History:  Diagnosis Date  . Diverticulitis of sigmoid colon   . Endometriosis   . Fibrocystic breast disease   . Glaucoma   . H/O urinary incontinence   . History of chicken pox   . History of colon polyps 2014  . Hyperlipidemia   . Hypertension   . Ovarian cancer ([place]) 2002   Chemo tx's @ [facility] with Total Hysterectomy. Dr Amalia Hailey  . Personal history of chemotherapy    ovarian cancer   . Port-A-Cath in place   . Sinusitis   . Tuberculosis    positve skin test    Past Surgical History:  Procedure Laterality Date  . ABDOMINAL HYSTERECTOMY  2003   complete Dr Amalia Hailey  . ABSCESS DRAINAGE  2017   right buttock at [facility]  . APPENDECTOMY  2003  . BREAST EXCISIONAL BIOPSY Left 1993   excisional bx. negative results  . BREAST SURGERY Left 1982   papilloma removal  . COLONOSCOPY  2006, 2014   Dr Vira Agar   . COLONOSCOPY N/A 03/27/2016   Procedure: COLONOSCOPY;  Surgeon: Robert Bellow, MD;  Location: [facility] [place] END[facility]COPY;  Service: Endoscopy;  Laterality: N/A;  . COLOSTOMY REVERSAL  2014   at [place] Dr Raynaldo Opitz  . NASAL SINUS SURGERY     For fungal infection  . NASAL SINUS SURGERY  8/05 8/06  . OOPHORECTOMY    . OSTOMY  05/22/2012   diverticulitis with abcess/ Dr Tamala Julian  . ovarian cancer     Exploratory lap  . PORTA CATH INSERTION N/A 05/26/2018   Procedure: PORTA CATH INSERTION;  Surgeon: Algernon Huxley, MD;  Location: [facility];  Service: Cardiovascular;  Laterality: N/A;  . TONSILLECTOMY AND ADENOIDECTOMY  1947    Family History  Problem Relation Age of Onset  . Hypertension Mother   . Coronary artery disease Mother   . Heart disease Mother   . Kidney disease Mother   . Diabetes Mother   . Cancer Mother        uterine cancer  . Diabetes Father   . Kidney failure Father   . Diabetes Brother   . Coronary artery disease Brother   . Cancer Brother        bladder cancer  . Stroke Sister   . Heart disease Sister        [facility]  . Cancer Maternal Aunt        ovarian cancer  . Breast cancer Cousin   . Cancer Brother        Prostate  . Breast cancer Cousin   . Breast cancer Cousin     Social History:  reports that she has never smoked. She has never used smokeless tobacco. She reports that she does not drink alcohol or use drugs.  Patient is a retired Engineer, site.The patient is accompanied by her friend, [name] [name], today.  Allergies: No  Known Allergies  Current Medications: Current Outpatient Medications  Medication Sig Dispense Refill  . amLODipine (NORVASC) 2.5 MG tablet Take 1 tablet (2.5 mg total) by mouth daily. 30 tablet 2  . Calcium Carbonate-Vitamin D 600-400 MG-UNIT tablet Take 1 tablet by mouth daily.     Marland Kitchen lidocaine-prilocaine (EMLA) cream Apply 1 application topically as needed. (Patient not taking: Reported on 07/22/2018) 30 g 5  . ondansetron (ZOFRAN) 8 MG tablet Take 1 tablet (8 mg total) by mouth every 8 (eight) hours as needed for nausea or vomiting. (  Patient not taking: Reported on 06/17/2018) 20 tablet 3  . prochlorperazine (COMPAZINE) 10 MG tablet Take 1 tablet (10 mg total) by mouth every 6 (six) hours as needed for nausea or vomiting. (Patient not taking: Reported on 06/17/2018) 30 tablet 3   Current Facility-Administered Medications  Medication Dose Route Frequency Provider Last Rate Last Dose  . lidocaine-prilocaine (EMLA) cream   Topical PRN Lucky Cowboy, Erskine Squibb, MD       Facility-Administered Medications Ordered in Other Visits  Medication Dose Route Frequency Provider Last Rate Last Dose  . heparin lock flush 100 unit/mL  500 Units Intravenous Once Corcoran, Melissa C, MD      . sodium chloride 0.9 % 50 mL with famotidine (PEPCID) 20 mg infusion  20 mg Intravenous Once Charlaine Dalton R, MD      . sodium chloride flush (NS) 0.9 % injection 10 mL  10 mL Intravenous PRN Cammie Sickle, MD   10 mL at 06/24/18 0854  . sodium chloride flush (NS) 0.9 % injection 10 mL  10 mL Intracatheter PRN Cammie Sickle, MD   10 mL at 07/01/18 0830  . sodium chloride flush (NS) 0.9 % injection 10 mL  10 mL Intravenous PRN Lequita Asal, MD   10 mL at 07/22/18 0830    Review of Systems:  GENERAL:  Feels "fairly well".  Energy level is "good".  No fevers, sweats or weight loss. PERFORMANCE STATUS (ECOG):  1 HEENT:  Some congestion (clear).  No visual changes, sore throat, mouth sores or  tenderness. Lungs: No shortness of breath or cough.  No hemoptysis. Cardiac:  No chest pain, palpitations, orthopnea, or PND. GI:  Constipation.  No nausea, vomiting, diarrhea,melena or hematochezia. GU:  No urgency, frequency, dysuria, or hematuria. Musculoskeletal:  No back pain.  Arthritis in knee.  No muscle tenderness. Extremities:  Lower extremity swelling (right > left).  No pain. Skin:  Hair loss.  No rashes or skin changes. Neuro:  Toes are "a little numb".  Fingers are "ok".  No headache, numbness or weakness, balance or coordination issues. Endocrine:  No diabetes, thyroid issues, hot flashes or night sweats. Psych:  No mood changes, depression or anxiety. Pain:  No focal pain. Review of systems:  All other systems reviewed and found to be negative.  Physical Exam: Blood pressure (!) 142/45, pulse 64, temperature 98 F (36.7 C), temperature source Tympanic, resp. rate 18, height _0  (1.6 m), weight 167 lb 1.7 oz (75.8 kg), SpO2 98 %. GENERAL:  Well developed, well nourished, woman sitting comfortably in the exam room in no acute distress. MENTAL STATUS:  Alert and oriented to person, place and time. HEAD:  Wearing a crochet cap. Normocephalic, atraumatic, face symmetric, no Cushingoid features. EYES:  Glasses.  Brown eyes.  Pupils equal round and reactive to light and accomodation.  No conjunctivitis or scleral icterus. ENT:  Oropharynx clear without lesion.  Tongue normal. Mucous membranes moist.  RESPIRATORY:  Clear to auscultation without rales, wheezes or rhonchi. CARDIOVASCULAR:  Regular rate and rhythm without murmur, rub or gallop. ABDOMEN:  Soft, non-tender, with active bowel sounds, and no hepatosplenomegaly.  No masses. SKIN:  No rashes, ulcers or lesions. EXTREMITIES: No edema, no skin discoloration or tenderness.  No palpable cords. LYMPH NODES: No palpable cervical, supraclavicular, axillary or inguinal adenopathy  NEUROLOGICAL: Unremarkable. PSYCH:   Appropriate.   Infusion on 07/22/2018  Component Date Value Ref Range Status  . WBC 07/22/2018 6.6  4.0 - 10.5 K/uL Final  .  RBC 07/22/2018 3.52* 3.87 - 5.11 MIL/uL Final  . Hemoglobin 07/22/2018 10.9* 12.0 - 15.0 g/dL Final  . HCT 07/22/2018 33.7* 36.0 - 46.0 % Final  . MCV 07/22/2018 95.7  80.0 - 100.0 fL Final  . MCH 07/22/2018 31.0  26.0 - 34.0 pg Final  . MCHC 07/22/2018 32.3  30.0 - 36.0 g/dL Final  . RDW 07/22/2018 15.0  11.5 - 15.5 % Final  . Platelets 07/22/2018 324  150 - 400 K/uL Final  . nRBC 07/22/2018 0.0  0.0 - 0.2 % Final  . Neutrophils Relative % 07/22/2018 59  % Final  . Neutro Abs 07/22/2018 3.9  1.7 - 7.7 K/uL Final  . Lymphocytes Relative 07/22/2018 26  % Final  . Lymphs Abs 07/22/2018 1.7  0.7 - 4.0 K/uL Final  . Monocytes Relative 07/22/2018 11  % Final  . Monocytes Absolute 07/22/2018 0.7  0.1 - 1.0 K/uL Final  . Eosinophils Relative 07/22/2018 2  % Final  . Eosinophils Absolute 07/22/2018 0.1  0.0 - 0.5 K/uL Final  . Basophils Relative 07/22/2018 1  % Final  . Basophils Absolute 07/22/2018 0.1  0.0 - 0.1 K/uL Final  . Immature Granulocytes 07/22/2018 1  % Final  . Abs Immature Granulocytes 07/22/2018 0.03  0.00 - 0.07 K/uL Final   Performed at [facility], [address], [place], [place] [address]  . Sodium 07/22/2018 138  135 - 145 mmol/L Final  . Potassium 07/22/2018 4.0  3.5 - 5.1 mmol/L Final  . Chloride 07/22/2018 105  98 - 111 mmol/L Final  . CO2 07/22/2018 26  22 - 32 mmol/L Final  . Glucose, Bld 07/22/2018 100* 70 - 99 mg/dL Final  . BUN 07/22/2018 14  8 - 23 mg/dL Final  . Creatinine, Ser 07/22/2018 0.54  0.44 - 1.00 mg/dL Final  . Calcium 07/22/2018 8.8* 8.9 - 10.3 mg/dL Final  . Total Protein 07/22/2018 6.3* 6.5 - 8.1 g/dL Final  . Albumin 07/22/2018 3.4* 3.5 - 5.0 g/dL Final  . AST 07/22/2018 23  15 - 41 U/L Final  . ALT 07/22/2018 24  0 - 44 U/L Final  . Alkaline Phosphatase 07/22/2018 93  38 - 126 U/L Final  . Total  Bilirubin 07/22/2018 0.7  0.3 - 1.2 mg/dL Final  . GFR calc non Af Amer 07/22/2018 >60  >60 mL/min Final  . GFR calc Af Amer 07/22/2018 >60  >60 mL/min Final  . Anion gap 07/22/2018 7  5 - 15 Final   Performed at [facility], [address] [place], [place] [address]  . Magnesium 07/22/2018 2.1  1.7 - 2.4 mg/dL Final   Performed at [facility] [address] [place], [place] [address]    Assessment:  GENEVEIVE FURNESS is a 85 y.o. female with stage I Her2/neu + breast cancer s/p lumpectomy and sentinel lymph node biopsy on 03/18/2018.  Pathology revealed a 0.6 cm grade III invasive mammary carcinoma.  There was DCIS.  There was no lymphovascular invasion.  Tumor was ER + (99%), PR + (25%), and Her2/neu + by FISH.  Pathologic stage was T1bN0.  Screening mammogram on 01/17/2018 revealed calcifications and possible mass in the right breast.  Diagnostic mammogram and ultrasound on 01/22/2018 revealed indeterminate group of microcalcifications over the right upper outer quadrant spanning 4 x 9 x 11 mm.  There was a 4 x 7 x 7 mm probable complicated cyst at the 11 o'clock position of the right breast  5 cm from the nipple.  She is s/p week #9 Herceptin and Taxol (05/20/2018 - 07/15/2017).  Echo at [facility] on 03/12/2018 revealed an EF of 61%.  He has a history of stage IIB ovarian cancer s/p exploratory laparotomy, TAH/BSO, omentectomy, peritoneal biopsies, selective pelvic and periaortic lymph node sampling on 06/20/2001.  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  Uterine serosa and the surface of the contralateral ovary were positive for metastatic disease. The primary tumor was approximately 15 cm in diameter.  She received 6 cycles of carboplatin and Taxol.  Bone density on 04/01/2017 revealed osteoporosis with a T-score of -2.5 in the left femoral neck.  Symptomatically, she is tolerating treatment well.  She has a stable grade I neuropathy in  her feet.  Exam reveals right lower extremity edema.  Plan: 1.   Labs today:  CBC with diff, CMP, Mg. 2.   Stage I Her2/neu + breast cancer  Review entire medical history, diagnosis and management of stage I Her2/neu + breast cancer.  Discuss plan for Herceptin and Taxol weekly x 12.  Discuss plans for radiation approximately 1 month post completion of Taxol.  Discuss plans for a year of adjuvant Herceptin (began 05/20/2018).  Anticipate switch to every 3 week Herceptin after completion of 12 weeks of Herceptin.  Discuss need for echo every 3 months while on Herceptin.  Discuss plan for post radiation endocrine therapy.   Discuss tamoxifen x 5 years, tamoxifen x 2-3 years followed by an AI to complete 5 years of therapy, or an AI x 5 years.  Discuss consideration of genetic testing given family history and patient's own history of breast cancer and ovarian cancer. 3.   Grade I neuropathy in feet  Discuss neuropathy in feet- mild.  Continue current dose of taxol.  Continue surveillance. 4.   Osteoporosis  Discuss bone density study on 04/01/2017.  Discuss calcium and vitamin D.  Discuss consideration of Prolia.  Side effects reviewed.  5.   Right lower extremity edema  Duplex right lower extremity- r/o DVT. 6.   Genetics referral. 7.   Schedule echo:  assess EF on Herceptin. 8.   RTC in 1 week for MD assessment, labs (CBC with diff, CMP, Mg), and cycle #11 Herceptin + Taxol. 9.   RTC in 2 weeks for MD assessment, labs (CBC with diff, CMP, Mg), and cycle #12 Herceptin + Taxol.  Addendum:  Right lower extremity duplex revealed no femoropopliteal and no calf DVT in the visualized calf veins.   Honor Loh, NP  07/22/2018, 9:16 AM   I saw and evaluated the patient, participating in the key portions of the service and reviewing pertinent diagnostic studies and records.  I reviewed the nurse practitioner's note and agree with the findings and the plan.  The assessment and plan were  discussed with the patient.  Multiple questions were asked by the patient and answered.   Nolon Stalls, MD 07/22/2018,9:16 AM

## 2018-07-22 NOTE — Telephone Encounter (Signed)
I was informed patient had questions/concerns about genetic counseling/genetic testing process.   I reached out and left a message introducing myself and left my contact information for her to call back if she has questions I can help answer.

## 2018-07-29 ENCOUNTER — Inpatient Hospital Stay: Payer: Medicare HMO

## 2018-07-29 ENCOUNTER — Encounter: Payer: Self-pay | Admitting: Hematology and Oncology

## 2018-07-29 ENCOUNTER — Inpatient Hospital Stay (HOSPITAL_BASED_OUTPATIENT_CLINIC_OR_DEPARTMENT_OTHER): Payer: Medicare HMO | Admitting: Hematology and Oncology

## 2018-07-29 VITALS — BP 140/78 | HR 78 | Temp 96.7°F | Resp 18 | Wt 167.4 lb

## 2018-07-29 DIAGNOSIS — G620 Drug-induced polyneuropathy: Secondary | ICD-10-CM

## 2018-07-29 DIAGNOSIS — Z170 Estrogen receptor positive status [ER+]: Principal | ICD-10-CM

## 2018-07-29 DIAGNOSIS — M810 Age-related osteoporosis without current pathological fracture: Secondary | ICD-10-CM | POA: Diagnosis not present

## 2018-07-29 DIAGNOSIS — R600 Localized edema: Secondary | ICD-10-CM | POA: Diagnosis not present

## 2018-07-29 DIAGNOSIS — Z5112 Encounter for antineoplastic immunotherapy: Secondary | ICD-10-CM

## 2018-07-29 DIAGNOSIS — Z8041 Family history of malignant neoplasm of ovary: Secondary | ICD-10-CM | POA: Diagnosis not present

## 2018-07-29 DIAGNOSIS — Z809 Family history of malignant neoplasm, unspecified: Secondary | ICD-10-CM | POA: Insufficient documentation

## 2018-07-29 DIAGNOSIS — Z5111 Encounter for antineoplastic chemotherapy: Secondary | ICD-10-CM

## 2018-07-29 DIAGNOSIS — C50411 Malignant neoplasm of upper-outer quadrant of right female breast: Secondary | ICD-10-CM

## 2018-07-29 DIAGNOSIS — I10 Essential (primary) hypertension: Secondary | ICD-10-CM

## 2018-07-29 DIAGNOSIS — G629 Polyneuropathy, unspecified: Secondary | ICD-10-CM | POA: Diagnosis not present

## 2018-07-29 DIAGNOSIS — T451X5A Adverse effect of antineoplastic and immunosuppressive drugs, initial encounter: Secondary | ICD-10-CM

## 2018-07-29 LAB — CBC WITH DIFFERENTIAL/PLATELET
Abs Immature Granulocytes: 0.06 10*3/uL (ref 0.00–0.07)
Basophils Absolute: 0.1 10*3/uL (ref 0.0–0.1)
Basophils Relative: 1 %
Eosinophils Absolute: 0.2 10*3/uL (ref 0.0–0.5)
Eosinophils Relative: 2 %
HCT: 33.1 % — ABNORMAL LOW (ref 36.0–46.0)
Hemoglobin: 10.8 g/dL — ABNORMAL LOW (ref 12.0–15.0)
Immature Granulocytes: 1 %
Lymphocytes Relative: 25 %
Lymphs Abs: 2 10*3/uL (ref 0.7–4.0)
MCH: 31.4 pg (ref 26.0–34.0)
MCHC: 32.6 g/dL (ref 30.0–36.0)
MCV: 96.2 fL (ref 80.0–100.0)
Monocytes Absolute: 0.8 10*3/uL (ref 0.1–1.0)
Monocytes Relative: 10 %
Neutro Abs: 4.8 10*3/uL (ref 1.7–7.7)
Neutrophils Relative %: 61 %
Platelets: 351 10*3/uL (ref 150–400)
RBC: 3.44 MIL/uL — ABNORMAL LOW (ref 3.87–5.11)
RDW: 15.3 % (ref 11.5–15.5)
WBC: 7.9 10*3/uL (ref 4.0–10.5)
nRBC: 0 % (ref 0.0–0.2)

## 2018-07-29 LAB — COMPREHENSIVE METABOLIC PANEL
ALT: 26 U/L (ref 0–44)
AST: 26 U/L (ref 15–41)
Albumin: 3.5 g/dL (ref 3.5–5.0)
Alkaline Phosphatase: 96 U/L (ref 38–126)
Anion gap: 7 (ref 5–15)
BUN: 14 mg/dL (ref 8–23)
CO2: 27 mmol/L (ref 22–32)
Calcium: 8.7 mg/dL — ABNORMAL LOW (ref 8.9–10.3)
Chloride: 104 mmol/L (ref 98–111)
Creatinine, Ser: 0.55 mg/dL (ref 0.44–1.00)
GFR calc Af Amer: >60 mL/min (ref >60)
GFR calc non Af Amer: >60 mL/min (ref >60)
Glucose, Bld: 105 mg/dL — ABNORMAL HIGH (ref 70–99)
Potassium: 4 mmol/L (ref 3.5–5.1)
Sodium: 138 mmol/L (ref 135–145)
Total Bilirubin: 0.2 mg/dL — ABNORMAL LOW (ref 0.3–1.2)
Total Protein: 6 g/dL — ABNORMAL LOW (ref 6.5–8.1)

## 2018-07-29 LAB — MAGNESIUM: Magnesium: 2.1 mg/dL (ref 1.7–2.4)

## 2018-07-29 MED ORDER — SODIUM CHLORIDE 0.9 % IV SOLN
20.0000 mg | Freq: Once | INTRAVENOUS | Status: AC
  Administered 2018-07-29: 20 mg via INTRAVENOUS
  Filled 2018-07-29: qty 2

## 2018-07-29 MED ORDER — ACETAMINOPHEN 325 MG PO TABS
650.0000 mg | ORAL_TABLET | Freq: Once | ORAL | Status: AC
  Administered 2018-07-29: 650 mg via ORAL
  Filled 2018-07-29: qty 2

## 2018-07-29 MED ORDER — SODIUM CHLORIDE 0.9 % IV SOLN
80.0000 mg/m2 | Freq: Once | INTRAVENOUS | Status: AC
  Administered 2018-07-29: 150 mg via INTRAVENOUS
  Filled 2018-07-29: qty 25

## 2018-07-29 MED ORDER — DIPHENHYDRAMINE HCL 50 MG/ML IJ SOLN
50.0000 mg | Freq: Once | INTRAMUSCULAR | Status: AC
  Administered 2018-07-29: 50 mg via INTRAVENOUS
  Filled 2018-07-29: qty 1

## 2018-07-29 MED ORDER — TRASTUZUMAB CHEMO 150 MG IV SOLR
150.0000 mg | Freq: Once | INTRAVENOUS | Status: AC
  Administered 2018-07-29: 150 mg via INTRAVENOUS
  Filled 2018-07-29: qty 7.14

## 2018-07-29 MED ORDER — SODIUM CHLORIDE 0.9% FLUSH
10.0000 mL | INTRAVENOUS | Status: DC | PRN
  Administered 2018-07-29: 10 mL via INTRAVENOUS
  Filled 2018-07-29: qty 10

## 2018-07-29 MED ORDER — HEPARIN SOD (PORK) LOCK FLUSH 100 UNIT/ML IV SOLN
500.0000 [IU] | Freq: Once | INTRAVENOUS | Status: AC
  Administered 2018-07-29: 500 [IU] via INTRAVENOUS
  Filled 2018-07-29: qty 5

## 2018-07-29 MED ORDER — FAMOTIDINE IN NACL 20-0.9 MG/50ML-% IV SOLN
20.0000 mg | Freq: Once | INTRAVENOUS | Status: AC
  Administered 2018-07-29: 20 mg via INTRAVENOUS
  Filled 2018-07-29: qty 50

## 2018-07-29 MED ORDER — SODIUM CHLORIDE 0.9 % IV SOLN
Freq: Once | INTRAVENOUS | Status: AC
  Administered 2018-07-29: 11:00:00 via INTRAVENOUS
  Filled 2018-07-29: qty 250

## 2018-07-29 NOTE — Progress Notes (Signed)
Pt here for follow up. Denies any concerns at this time.  

## 2018-07-29 NOTE — Patient Instructions (Signed)
Paclitaxel injection What is this medicine? PACLITAXEL (PAK li TAX el) is a chemotherapy drug. It targets fast dividing cells, like cancer cells, and causes these cells to die. This medicine is used to treat ovarian cancer, breast cancer, lung cancer, Kaposi's sarcoma, and other cancers. This medicine may be used for other purposes; ask your health care provider or pharmacist if you have questions. COMMON BRAND NAME(S): Onxol, Taxol What should I tell my health care provider before I take this medicine? They need to know if you have any of these conditions: -history of irregular heartbeat -liver disease -low blood counts, like low white cell, platelet, or red cell counts -lung or breathing disease, like asthma -tingling of the fingers or toes, or other nerve disorder -an unusual or allergic reaction to paclitaxel, alcohol, polyoxyethylated castor oil, other chemotherapy, other medicines, foods, dyes, or preservatives -pregnant or trying to get pregnant -breast-feeding How should I use this medicine? This drug is given as an infusion into a vein. It is administered in a hospital or clinic by a specially trained health care professional. Talk to your pediatrician regarding the use of this medicine in children. Special care may be needed. Overdosage: If you think you have taken too much of this medicine contact a poison control center or emergency room at once. NOTE: This medicine is only for you. Do not share this medicine with others. What if I miss a dose? It is important not to miss your dose. Call your doctor or health care professional if you are unable to keep an appointment. What may interact with this medicine? Do not take this medicine with any of the following medications: -disulfiram -metronidazole This medicine may also interact with the following medications: -antiviral medicines for hepatitis, HIV or AIDS -certain antibiotics like erythromycin and clarithromycin -certain  medicines for fungal infections like ketoconazole and itraconazole -certain medicines for seizures like carbamazepine, phenobarbital, phenytoin -gemfibrozil -nefazodone -rifampin -St. John's wort This list may not describe all possible interactions. Give your health care provider a list of all the medicines, herbs, non-prescription drugs, or dietary supplements you use. Also tell them if you smoke, drink alcohol, or use illegal drugs. Some items may interact with your medicine. What should I watch for while using this medicine? Your condition will be monitored carefully while you are receiving this medicine. You will need important blood work done while you are taking this medicine. This medicine can cause serious allergic reactions. To reduce your risk you will need to take other medicine(s) before treatment with this medicine. If you experience allergic reactions like skin rash, itching or hives, swelling of the face, lips, or tongue, tell your doctor or health care professional right away. In some cases, you may be given additional medicines to help with side effects. Follow all directions for their use. This drug may make you feel generally unwell. This is not uncommon, as chemotherapy can affect healthy cells as well as cancer cells. Report any side effects. Continue your course of treatment even though you feel ill unless your doctor tells you to stop. Call your doctor or health care professional for advice if you get a fever, chills or sore throat, or other symptoms of a cold or flu. Do not treat yourself. This drug decreases your body's ability to fight infections. Try to avoid being around people who are sick. This medicine may increase your risk to bruise or bleed. Call your doctor or health care professional if you notice any unusual bleeding. Be careful brushing   your body's ability to fight infections. Try to avoid being around people who are sick.  This medicine may increase your risk to bruise or bleed. Call your doctor or health care professional if you notice any unusual bleeding.  Be careful brushing and flossing your teeth or using a toothpick because you may get an infection or bleed more easily. If you have any dental work  done, tell your dentist you are receiving this medicine.  Avoid taking products that contain aspirin, acetaminophen, ibuprofen, naproxen, or ketoprofen unless instructed by your doctor. These medicines may hide a fever.  Do not become pregnant while taking this medicine. Women should inform their doctor if they wish to become pregnant or think they might be pregnant. There is a potential for serious side effects to an unborn child. Talk to your health care professional or pharmacist for more information. Do not breast-feed an infant while taking this medicine.  Men are advised not to father a child while receiving this medicine.  This product may contain alcohol. Ask your pharmacist or healthcare provider if this medicine contains alcohol. Be sure to tell all healthcare providers you are taking this medicine. Certain medicines, like metronidazole and disulfiram, can cause an unpleasant reaction when taken with alcohol. The reaction includes flushing, headache, nausea, vomiting, sweating, and increased thirst. The reaction can last from 30 minutes to several hours.  What side effects may I notice from receiving this medicine?  Side effects that you should report to your doctor or health care professional as soon as possible:  -allergic reactions like skin rash, itching or hives, swelling of the face, lips, or tongue  -breathing problems  -changes in vision  -fast, irregular heartbeat  -high or low blood pressure  -mouth sores  -pain, tingling, numbness in the hands or feet  -signs of decreased platelets or bleeding - bruising, pinpoint red spots on the skin, black, tarry stools, blood in the urine  -signs of decreased red blood cells - unusually weak or tired, feeling faint or lightheaded, falls  -signs of infection - fever or chills, cough, sore throat, pain or difficulty passing urine  -signs and symptoms of liver injury like dark yellow or brown urine; general ill feeling or flu-like symptoms; light-colored  stools; loss of appetite; nausea; right upper belly pain; unusually weak or tired; yellowing of the eyes or skin  -swelling of the ankles, feet, hands  -unusually slow heartbeat  Side effects that usually do not require medical attention (report to your doctor or health care professional if they continue or are bothersome):  -diarrhea  -hair loss  -loss of appetite  -muscle or joint pain  -nausea, vomiting  -pain, redness, or irritation at site where injected  -tiredness  This list may not describe all possible side effects. Call your doctor for medical advice about side effects. You may report side effects to FDA at 1-800-FDA-1088.  Where should I keep my medicine?  This drug is given in a hospital or clinic and will not be stored at home.  NOTE: This sheet is a summary. It may not cover all possible information. If you have questions about this medicine, talk to your doctor, pharmacist, or health care provider.   2019 Elsevier/Gold Standard (2017-01-29 13:14:55)  Trastuzumab injection for infusion  What is this medicine?  TRASTUZUMAB (tras TOO zoo mab) is a monoclonal antibody. It is used to treat breast cancer and stomach cancer.  This medicine may be used for other purposes; ask your health care   provider or pharmacist if you have questions.  COMMON BRAND NAME(S): Herceptin, KANJINTI, OGIVRI  What should I tell my health care provider before I take this medicine?  They need to know if you have any of these conditions:  -heart disease  -heart failure  -lung or breathing disease, like asthma  -an unusual or allergic reaction to trastuzumab, benzyl alcohol, or other medications, foods, dyes, or preservatives  -pregnant or trying to get pregnant  -breast-feeding  How should I use this medicine?  This drug is given as an infusion into a vein. It is administered in a hospital or clinic by a specially trained health care professional.  Talk to your pediatrician regarding the use of this medicine in children. This  medicine is not approved for use in children.  Overdosage: If you think you have taken too much of this medicine contact a poison control center or emergency room at once.  NOTE: This medicine is only for you. Do not share this medicine with others.  What if I miss a dose?  It is important not to miss a dose. Call your doctor or health care professional if you are unable to keep an appointment.  What may interact with this medicine?  This medicine may interact with the following medications:  -certain types of chemotherapy, such as daunorubicin, doxorubicin, epirubicin, and idarubicin  This list may not describe all possible interactions. Give your health care provider a list of all the medicines, herbs, non-prescription drugs, or dietary supplements you use. Also tell them if you smoke, drink alcohol, or use illegal drugs. Some items may interact with your medicine.  What should I watch for while using this medicine?  Visit your doctor for checks on your progress. Report any side effects. Continue your course of treatment even though you feel ill unless your doctor tells you to stop.  Call your doctor or health care professional for advice if you get a fever, chills or sore throat, or other symptoms of a cold or flu. Do not treat yourself. Try to avoid being around people who are sick.  You may experience fever, chills and shaking during your first infusion. These effects are usually mild and can be treated with other medicines. Report any side effects during the infusion to your health care professional. Fever and chills usually do not happen with later infusions.  Do not become pregnant while taking this medicine or for 7 months after stopping it. Women should inform their doctor if they wish to become pregnant or think they might be pregnant. Women of child-bearing potential will need to have a negative pregnancy test before starting this medicine. There is a potential for serious side effects to an unborn  child. Talk to your health care professional or pharmacist for more information. Do not breast-feed an infant while taking this medicine or for 7 months after stopping it.  Women must use effective birth control with this medicine.  What side effects may I notice from receiving this medicine?  Side effects that you should report to your doctor or health care professional as soon as possible:  -allergic reactions like skin rash, itching or hives, swelling of the face, lips, or tongue  -chest pain or palpitations  -cough  -dizziness  -feeling faint or lightheaded, falls  -fever  -general ill feeling or flu-like symptoms  -signs of worsening heart failure like breathing problems; swelling in your legs and feet  -unusually weak or tired  Side effects that usually do not

## 2018-07-29 NOTE — Patient Instructions (Addendum)
Paclitaxel injection What is this medicine? PACLITAXEL (PAK li TAX el) is a chemotherapy drug. It targets fast dividing cells, like cancer cells, and causes these cells to die. This medicine is used to treat ovarian cancer, breast cancer, lung cancer, Kaposi's sarcoma, and other cancers. This medicine may be used for other purposes; ask your health care provider or pharmacist if you have questions. COMMON BRAND NAME(S): Onxol, Taxol What should I tell my health care provider before I take this medicine? They need to know if you have any of these conditions: -history of irregular heartbeat -liver disease -low blood counts, like low white cell, platelet, or red cell counts -lung or breathing disease, like asthma -tingling of the fingers or toes, or other nerve disorder -an unusual or allergic reaction to paclitaxel, alcohol, polyoxyethylated castor oil, other chemotherapy, other medicines, foods, dyes, or preservatives -pregnant or trying to get pregnant -breast-feeding How should I use this medicine? This drug is given as an infusion into a vein. It is administered in a hospital or clinic by a specially trained health care professional. Talk to your pediatrician regarding the use of this medicine in children. Special care may be needed. Overdosage: If you think you have taken too much of this medicine contact a poison control center or emergency room at once. NOTE: This medicine is only for you. Do not share this medicine with others. What if I miss a dose? It is important not to miss your dose. Call your doctor or health care professional if you are unable to keep an appointment. What may interact with this medicine? Do not take this medicine with any of the following medications: -disulfiram -metronidazole This medicine may also interact with the following medications: -antiviral medicines for hepatitis, HIV or AIDS -certain antibiotics like erythromycin and clarithromycin -certain  medicines for fungal infections like ketoconazole and itraconazole -certain medicines for seizures like carbamazepine, phenobarbital, phenytoin -gemfibrozil -nefazodone -rifampin -St. John's wort This list may not describe all possible interactions. Give your health care provider a list of all the medicines, herbs, non-prescription drugs, or dietary supplements you use. Also tell them if you smoke, drink alcohol, or use illegal drugs. Some items may interact with your medicine. What should I watch for while using this medicine? Your condition will be monitored carefully while you are receiving this medicine. You will need important blood work done while you are taking this medicine. This medicine can cause serious allergic reactions. To reduce your risk you will need to take other medicine(s) before treatment with this medicine. If you experience allergic reactions like skin rash, itching or hives, swelling of the face, lips, or tongue, tell your doctor or health care professional right away. In some cases, you may be given additional medicines to help with side effects. Follow all directions for their use. This drug may make you feel generally unwell. This is not uncommon, as chemotherapy can affect healthy cells as well as cancer cells. Report any side effects. Continue your course of treatment even though you feel ill unless your doctor tells you to stop. Call your doctor or health care professional for advice if you get a fever, chills or sore throat, or other symptoms of a cold or flu. Do not treat yourself. This drug decreases your body's ability to fight infections. Try to avoid being around people who are sick. This medicine may increase your risk to bruise or bleed. Call your doctor or health care professional if you notice any unusual bleeding. Be careful brushing   your body's ability to fight infections. Try to avoid being around people who are sick.  This medicine may increase your risk to bruise or bleed. Call your doctor or health care professional if you notice any unusual bleeding.  Be careful brushing and flossing your teeth or using a toothpick because you may get an infection or bleed more easily. If you have any dental work  done, tell your dentist you are receiving this medicine.  Avoid taking products that contain aspirin, acetaminophen, ibuprofen, naproxen, or ketoprofen unless instructed by your doctor. These medicines may hide a fever.  Do not become pregnant while taking this medicine. Women should inform their doctor if they wish to become pregnant or think they might be pregnant. There is a potential for serious side effects to an unborn child. Talk to your health care professional or pharmacist for more information. Do not breast-feed an infant while taking this medicine.  Men are advised not to father a child while receiving this medicine.  This product may contain alcohol. Ask your pharmacist or healthcare provider if this medicine contains alcohol. Be sure to tell all healthcare providers you are taking this medicine. Certain medicines, like metronidazole and disulfiram, can cause an unpleasant reaction when taken with alcohol. The reaction includes flushing, headache, nausea, vomiting, sweating, and increased thirst. The reaction can last from 30 minutes to several hours.  What side effects may I notice from receiving this medicine?  Side effects that you should report to your doctor or health care professional as soon as possible:  -allergic reactions like skin rash, itching or hives, swelling of the face, lips, or tongue  -breathing problems  -changes in vision  -fast, irregular heartbeat  -high or low blood pressure  -mouth sores  -pain, tingling, numbness in the hands or feet  -signs of decreased platelets or bleeding - bruising, pinpoint red spots on the skin, black, tarry stools, blood in the urine  -signs of decreased red blood cells - unusually weak or tired, feeling faint or lightheaded, falls  -signs of infection - fever or chills, cough, sore throat, pain or difficulty passing urine  -signs and symptoms of liver injury like dark yellow or brown urine; general ill feeling or flu-like symptoms; light-colored  stools; loss of appetite; nausea; right upper belly pain; unusually weak or tired; yellowing of the eyes or skin  -swelling of the ankles, feet, hands  -unusually slow heartbeat  Side effects that usually do not require medical attention (report to your doctor or health care professional if they continue or are bothersome):  -diarrhea  -hair loss  -loss of appetite  -muscle or joint pain  -nausea, vomiting  -pain, redness, or irritation at site where injected  -tiredness  This list may not describe all possible side effects. Call your doctor for medical advice about side effects. You may report side effects to FDA at 1-800-FDA-1088.  Where should I keep my medicine?  This drug is given in a hospital or clinic and will not be stored at home.  NOTE: This sheet is a summary. It may not cover all possible information. If you have questions about this medicine, talk to your doctor, pharmacist, or health care provider.   2019 Elsevier/Gold Standard (2017-01-29 13:14:55)  Trastuzumab injection for infusion  What is this medicine?  TRASTUZUMAB (tras TOO zoo mab) is a monoclonal antibody. It is used to treat breast cancer and stomach cancer.  This medicine may be used for other purposes; ask your health care   provider or pharmacist if you have questions.  COMMON BRAND NAME(S): Herceptin, KANJINTI, OGIVRI  What should I tell my health care provider before I take this medicine?  They need to know if you have any of these conditions:  -heart disease  -heart failure  -lung or breathing disease, like asthma  -an unusual or allergic reaction to trastuzumab, benzyl alcohol, or other medications, foods, dyes, or preservatives  -pregnant or trying to get pregnant  -breast-feeding  How should I use this medicine?  This drug is given as an infusion into a vein. It is administered in a hospital or clinic by a specially trained health care professional.  Talk to your pediatrician regarding the use of this medicine in children. This  medicine is not approved for use in children.  Overdosage: If you think you have taken too much of this medicine contact a poison control center or emergency room at once.  NOTE: This medicine is only for you. Do not share this medicine with others.  What if I miss a dose?  It is important not to miss a dose. Call your doctor or health care professional if you are unable to keep an appointment.  What may interact with this medicine?  This medicine may interact with the following medications:  -certain types of chemotherapy, such as daunorubicin, doxorubicin, epirubicin, and idarubicin  This list may not describe all possible interactions. Give your health care provider a list of all the medicines, herbs, non-prescription drugs, or dietary supplements you use. Also tell them if you smoke, drink alcohol, or use illegal drugs. Some items may interact with your medicine.  What should I watch for while using this medicine?  Visit your doctor for checks on your progress. Report any side effects. Continue your course of treatment even though you feel ill unless your doctor tells you to stop.  Call your doctor or health care professional for advice if you get a fever, chills or sore throat, or other symptoms of a cold or flu. Do not treat yourself. Try to avoid being around people who are sick.  You may experience fever, chills and shaking during your first infusion. These effects are usually mild and can be treated with other medicines. Report any side effects during the infusion to your health care professional. Fever and chills usually do not happen with later infusions.  Do not become pregnant while taking this medicine or for 7 months after stopping it. Women should inform their doctor if they wish to become pregnant or think they might be pregnant. Women of child-bearing potential will need to have a negative pregnancy test before starting this medicine. There is a potential for serious side effects to an unborn  child. Talk to your health care professional or pharmacist for more information. Do not breast-feed an infant while taking this medicine or for 7 months after stopping it.  Women must use effective birth control with this medicine.  What side effects may I notice from receiving this medicine?  Side effects that you should report to your doctor or health care professional as soon as possible:  -allergic reactions like skin rash, itching or hives, swelling of the face, lips, or tongue  -chest pain or palpitations  -cough  -dizziness  -feeling faint or lightheaded, falls  -fever  -general ill feeling or flu-like symptoms  -signs of worsening heart failure like breathing problems; swelling in your legs and feet  -unusually weak or tired  Side effects that usually do not

## 2018-07-29 NOTE — Progress Notes (Signed)
[facility] day:  07/29/2018  Chief Complaint: [name] [name] is a 85 y.o. female with stage I Her2/neu + breast cancer who is seen for assessment prior to week #11 of Herceptin and Taxol.  HPI:  The patient was last seen in the medical oncology clinic on 07/22/2018.  At that time, she felt pretty good.  She had a little neuropathy in her toes.  Exam revealed right lower extremity edema.  Labs were unremarkable.  Right lower extremity duplex revealed no femoropopliteal and no calf DVT in the visualized calf veins.  We discussed consideration of a genetics referral given her prior history of ovarian cancer and current history of breast cancer plus her family history.  We discussed a follow-up echo.  She received week #10 Herceptin and Taxol.  During the interim, she denies any change in her neuropathy.  She denies any nausea or vomiting.  She denies any shortness of breath or chest pain.  Echo is scheduled for 08/01/2018.   Past Medical History:  Diagnosis Date  . Diverticulitis of sigmoid colon   . Endometriosis   . Fibrocystic breast disease   . Glaucoma   . H/O urinary incontinence   . History of chicken pox   . History of colon polyps 2014  . Hyperlipidemia   . Hypertension   . Ovarian cancer ([place]) 2002   Chemo tx's @ [facility] with Total Hysterectomy. Dr Amalia Hailey  . Personal history of chemotherapy    ovarian cancer  . Port-A-Cath in place   . Sinusitis   . Tuberculosis    positve skin test    Past Surgical History:  Procedure Laterality Date  . ABDOMINAL HYSTERECTOMY  2003   complete Dr Amalia Hailey  . ABSCESS DRAINAGE  2017   right buttock at [facility]  . APPENDECTOMY  2003  . BREAST EXCISIONAL BIOPSY Left 1993   excisional bx. negative results  . BREAST SURGERY Left 1982   papilloma removal  . COLONOSCOPY  2006, 2014   Dr Vira Agar   . COLONOSCOPY N/A 03/27/2016   Procedure: COLONOSCOPY;  Surgeon: Robert Bellow, MD;  Location: [facility]  ENDOSCOPY;  Service: Endoscopy;  Laterality: N/A;  . COLOSTOMY REVERSAL  2014   at [place] Dr Raynaldo Opitz  . NASAL SINUS SURGERY     For fungal infection  . NASAL SINUS SURGERY  8/05 8/06  . OOPHORECTOMY    . OSTOMY  05/22/2012   diverticulitis with abcess/ Dr Tamala Julian  . ovarian cancer     Exploratory lap  . PORTA CATH INSERTION N/A 05/26/2018   Procedure: PORTA CATH INSERTION;  Surgeon: Algernon Huxley, MD;  Location: [place] [facility];  Service: Cardiovascular;  Laterality: N/A;  . TONSILLECTOMY AND ADENOIDECTOMY  1947    Family History  Problem Relation Age of Onset  . Hypertension Mother   . Coronary artery disease Mother   . Heart disease Mother   . Kidney disease Mother   . Diabetes Mother   . Cancer Mother        uterine cancer  . Diabetes Father   . Kidney failure Father   . Diabetes Brother   . Coronary artery disease Brother   . Cancer Brother        bladder cancer  . Stroke Sister   . Heart disease Sister        [facility]  . Cancer Maternal Aunt        ovarian cancer  .  Breast cancer Cousin   . Cancer Brother        Prostate  . Breast cancer Cousin   . Breast cancer Cousin     Social History:  reports that she has never smoked. She has never used smokeless tobacco. She reports that she does not drink alcohol or use drugs.  Patient is a retired Engineer, site.The patient is accompanied by her friend, [name] [name], today.  Allergies: No Known Allergies  Current Medications: Current Outpatient Medications  Medication Sig Dispense Refill  . amLODipine (NORVASC) 2.5 MG tablet Take 1 tablet (2.5 mg total) by mouth daily. 30 tablet 2  . Calcium Carbonate-Vitamin D 600-400 MG-UNIT tablet Take 1 tablet by mouth daily.     Marland Kitchen lidocaine-prilocaine (EMLA) cream Apply 1 application topically as needed. 30 g 5  . prochlorperazine (COMPAZINE) 10 MG tablet Take 1 tablet (10 mg total) by mouth every 6 (six) hours as needed for nausea or vomiting. 30 tablet 3   Current  Facility-Administered Medications  Medication Dose Route Frequency Provider Last Rate Last Dose  . lidocaine-prilocaine (EMLA) cream   Topical PRN Lucky Cowboy, Erskine Squibb, MD       Facility-Administered Medications Ordered in Other Visits  Medication Dose Route Frequency Provider Last Rate Last Dose  . heparin lock flush 100 unit/mL  500 Units Intravenous Once Corcoran, Melissa C, MD      . sodium chloride 0.9 % 50 mL with famotidine (PEPCID) 20 mg infusion  20 mg Intravenous Once Charlaine Dalton R, MD      . sodium chloride flush (NS) 0.9 % injection 10 mL  10 mL Intravenous PRN Cammie Sickle, MD   10 mL at 06/24/18 0854  . sodium chloride flush (NS) 0.9 % injection 10 mL  10 mL Intracatheter PRN Cammie Sickle, MD   10 mL at 07/01/18 0830  . sodium chloride flush (NS) 0.9 % injection 10 mL  10 mL Intravenous PRN Lequita Asal, MD   10 mL at 07/29/18 7353    Review of Systems:  GENERAL:  Feels good.  No fevers, sweats or weight loss.  Weight stable. PERFORMANCE STATUS (ECOG):  1 HEENT:  No sinus problems.  No visual changes, sore throat, mouth sores or tenderness. Lungs: No shortness of breath or cough.  No hemoptysis. Cardiac:  No chest pain, palpitations, orthopnea, or PND.  Echo on on 08/01/2018. GI:  No nausea, vomiting, diarrhea, constipation, melena or hematochezia. GU:  No urgency, frequency, dysuria, or hematuria. Musculoskeletal:  No back pain.  No joint pain.  No muscle tenderness. Extremities:  No pain.  Chronic lower extremity swelling. Skin:  No rashes or skin changes. Neuro:  Stable neuropathy in feet.  No headache, focal weakness, balance or coordination issues. Endocrine:  No diabetes, thyroid issues, hot flashes or night sweats. Psych:  No mood changes, depression or anxiety. Pain:  No focal pain. Review of systems:  All other systems reviewed and found to be negative.   Physical Exam: Blood pressure 134/79, pulse 70, temperature 97.7 F (36.5 C),  temperature source Oral, resp. rate 18, weight 167 lb 7 oz (76 kg), SpO2 98 %. GENERAL:  Well developed, well nourished, woman sitting comfortably in the exam room in no acute distress.  Needs assistance onto table. MENTAL STATUS:  Alert and oriented to person, place and time. HEAD:  Wearing a crochet cap.  Normocephalic, atraumatic, face symmetric, no Cushingoid features. EYES:  Brown eyes.  Pupils equal round and reactive to light  and accomodation.  No conjunctivitis or scleral icterus. ENT:  Oropharynx clear without lesion.  Tongue normal. Mucous membranes moist.  RESPIRATORY:  Clear to auscultation without rales, wheezes or rhonchi. CARDIOVASCULAR:  Regular rate and rhythm without murmur, rub or gallop. ABDOMEN:  Soft, non-tender, with active bowel sounds, and no hepatosplenomegaly.  No masses. SKIN:  No rashes, ulcers or lesions. EXTREMITIES: No edema, no skin discoloration or tenderness.  No palpable cords. LYMPH NODES: No palpable cervical, supraclavicular, axillary or inguinal adenopathy  NEUROLOGICAL: Unremarkable. PSYCH:  Appropriate.    Infusion on 07/29/2018  Component Date Value Ref Range Status  . Sodium 07/29/2018 138  135 - 145 mmol/L Final  . Potassium 07/29/2018 4.0  3.5 - 5.1 mmol/L Final  . Chloride 07/29/2018 104  98 - 111 mmol/L Final  . CO2 07/29/2018 27  22 - 32 mmol/L Final  . Glucose, Bld 07/29/2018 105* 70 - 99 mg/dL Final  . BUN 07/29/2018 14  8 - 23 mg/dL Final  . Creatinine, Ser 07/29/2018 0.55  0.44 - 1.00 mg/dL Final  . Calcium 07/29/2018 8.7* 8.9 - 10.3 mg/dL Final  . Total Protein 07/29/2018 6.0* 6.5 - 8.1 g/dL Final  . Albumin 07/29/2018 3.5  3.5 - 5.0 g/dL Final  . AST 07/29/2018 26  15 - 41 U/L Final  . ALT 07/29/2018 26  0 - 44 U/L Final  . Alkaline Phosphatase 07/29/2018 96  38 - 126 U/L Final  . Total Bilirubin 07/29/2018 0.2* 0.3 - 1.2 mg/dL Final  . GFR calc non Af Amer 07/29/2018 >60  >60 mL/min Final  . GFR calc Af Amer 07/29/2018 >60  >60  mL/min Final  . Anion gap 07/29/2018 7  5 - 15 Final   Performed at [facility], [address] [place], [place] [address]  . WBC 07/29/2018 7.9  4.0 - 10.5 K/uL Final  . RBC 07/29/2018 3.44* 3.87 - 5.11 MIL/uL Final  . Hemoglobin 07/29/2018 10.8* 12.0 - 15.0 g/dL Final  . HCT 07/29/2018 33.1* 36.0 - 46.0 % Final  . MCV 07/29/2018 96.2  80.0 - 100.0 fL Final  . MCH 07/29/2018 31.4  26.0 - 34.0 pg Final  . MCHC 07/29/2018 32.6  30.0 - 36.0 g/dL Final  . RDW 07/29/2018 15.3  11.5 - 15.5 % Final  . Platelets 07/29/2018 351  150 - 400 K/uL Final  . nRBC 07/29/2018 0.0  0.0 - 0.2 % Final  . Neutrophils Relative % 07/29/2018 61  % Final  . Neutro Abs 07/29/2018 4.8  1.7 - 7.7 K/uL Final  . Lymphocytes Relative 07/29/2018 25  % Final  . Lymphs Abs 07/29/2018 2.0  0.7 - 4.0 K/uL Final  . Monocytes Relative 07/29/2018 10  % Final  . Monocytes Absolute 07/29/2018 0.8  0.1 - 1.0 K/uL Final  . Eosinophils Relative 07/29/2018 2  % Final  . Eosinophils Absolute 07/29/2018 0.2  0.0 - 0.5 K/uL Final  . Basophils Relative 07/29/2018 1  % Final  . Basophils Absolute 07/29/2018 0.1  0.0 - 0.1 K/uL Final  . Immature Granulocytes 07/29/2018 1  % Final  . Abs Immature Granulocytes 07/29/2018 0.06  0.00 - 0.07 K/uL Final   Performed at [facility], [address] [place], [place] [address]  . Magnesium 07/29/2018 2.1  1.7 - 2.4 mg/dL Final   Performed at [facility], [address], [place], [place] [address]    Assessment:  KHUSHBU PIPPEN is a 85 y.o. female  with stage I Her2/neu + breast cancer s/p lumpectomy and sentinel lymph node biopsy on 03/18/2018.  Pathology revealed a 0.6 cm grade III invasive mammary carcinoma.  There was DCIS.  There was no lymphovascular invasion.  Tumor was ER + (99%), PR + (25%), and Her2/neu + by FISH.  Pathologic stage was T1bN0.  Screening mammogram on 01/17/2018 revealed calcifications and possible mass in the right breast.   Diagnostic mammogram and ultrasound on 01/22/2018 revealed indeterminate group of microcalcifications over the right upper outer quadrant spanning 4 x 9 x 11 mm.  There was a 4 x 7 x 7 mm probable complicated cyst at the 11 o'clock position of the right breast 5 cm from the nipple.  She is s/p week #10 Herceptin and Taxol (05/20/2018 - 07/22/2017).  Echo at [facility] on 03/12/2018 revealed an EF of 61%.  He has a history of stage IIB ovarian cancer s/p exploratory laparotomy, TAH/BSO, omentectomy, peritoneal biopsies, selective pelvic and periaortic lymph node sampling on 06/20/2001.  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  Uterine serosa and the surface of the contralateral ovary were positive for metastatic disease. The primary tumor was approximately 15 cm in diameter.  She received 6 cycles of carboplatin and Taxol.  Bone density on 04/01/2017 revealed osteoporosis with a T-score of -2.5 in the left femoral neck.  She has grade I neuropathy in her feet.  She has chronic right lower extremity edema.  Right lower extremity duplex on 07/22/2018 revealed no femoropopliteal and no calf DVT in the visualized calf veins.  Symptomatically, she denies any change in her neuropathy.  Exam reveals chronic right lower extremity edema.    Plan: 1.   Labs today:  CBC with diff, CMP, Mg. 2.   Stage I Her2/neu + breast cancer  Week #11 of 12 Herceptin and Taxol today.  Discuss symptom management.  She has antiemetics and pain medications at home to use on a prn bases.  Interventions are adequate.    Patient requested review of conversation last week regarding direction of therapy after completion of 12 weeks of Herceptin and Taxol.   Discuss radiation approximately 1 month post completion of Taxol.   Discuss continuation of a year of adjuvant Herceptin (05/20/2018 - 05/20/2019).   Discuss consideration of a switch from weekly to every 3 week Herceptin after initial 12  weeks.   Discuss 5 years of endocrine therapy post radiation.    Review issues with tamoxifen versus aromatase inhibitors.  Follow-up echo on 08/01/2018.   Discuss plan for echo every 3 months while on Herceptin.  Discuss genetic testing given family history and patient's own history of breast cancer and ovarian cancer.   Patient missed phone call from Ferol Luz, genetics counselor.   Contact Ferol Luz to reach out to patient again. 3.   Grade I neuropathy in feet  Patient notes stable neuropathy.  Continue Taxol. 4.   Osteoporosis  Discuss calcium and vitamin D.  Discuss consideration of Prolia.  Side effects re-reviewed.   Discuss need for dental clearance.  Information provided. 5.   RTC in 1 week for MD assessment, labs (CBC with diff, CMP, Mg), review of echo, and week #12 Herceptin and Taxol.   Lequita Asal, MD  07/29/2018, 10:43 AM

## 2018-07-31 ENCOUNTER — Telehealth: Payer: Self-pay | Admitting: Genetics

## 2018-07-31 NOTE — Telephone Encounter (Signed)
Called patient- she had questions regarding the genetic counseling process.   I explained genetic counseling/testing and the reasons why people choose to do this testing.  She states she does not have children, but does have nieces and nephews that she thinks could possibly benefit.  After learning what this process is, she was agreeable to scheduling a genetic counseling appointment.  Our next available was 7/30- she is schedule for 01/08/2019 at 11am at the [facility] with [facility] Rogue.   She did request we let her know if other openings/cancellations come up before then.  She reports her mother had uterine cancer, her brother had prostate cancer, her other brother had bladder cancer and that there is a set of double first cousins in her family.

## 2018-08-01 ENCOUNTER — Ambulatory Visit: Payer: Medicare HMO

## 2018-08-01 ENCOUNTER — Telehealth: Payer: Self-pay

## 2018-08-01 NOTE — Telephone Encounter (Signed)
Spoke with [name] [name] to inform her that her B-12 levels were to high and they would like for her to stop taking the B-12 daily and change to every other day. The patient was understanding and agreea le to stop taking B-12 daily and start every other day.

## 2018-08-01 NOTE — Telephone Encounter (Signed)
-----   Message from Lequita Asal, MD sent at 07/29/2018  1:24 PM EST ----- Regarding: Make sure patient is taking her calcium  ----- Message ----- From: Interface, Lab In [place] Sent: 07/29/2018   9:39 AM EST To: Lequita Asal, MD

## 2018-08-05 ENCOUNTER — Encounter: Payer: Self-pay | Admitting: Hematology and Oncology

## 2018-08-05 ENCOUNTER — Inpatient Hospital Stay (HOSPITAL_BASED_OUTPATIENT_CLINIC_OR_DEPARTMENT_OTHER): Payer: Medicare HMO | Admitting: Hematology and Oncology

## 2018-08-05 ENCOUNTER — Inpatient Hospital Stay: Payer: Medicare HMO

## 2018-08-05 VITALS — BP 148/75 | HR 78 | Resp 18

## 2018-08-05 VITALS — BP 133/71 | HR 75 | Temp 98.6°F | Resp 18 | Ht 63.0 in | Wt 165.3 lb

## 2018-08-05 DIAGNOSIS — C50411 Malignant neoplasm of upper-outer quadrant of right female breast: Secondary | ICD-10-CM

## 2018-08-05 DIAGNOSIS — Z170 Estrogen receptor positive status [ER+]: Principal | ICD-10-CM

## 2018-08-05 DIAGNOSIS — Z5111 Encounter for antineoplastic chemotherapy: Secondary | ICD-10-CM

## 2018-08-05 DIAGNOSIS — Z8041 Family history of malignant neoplasm of ovary: Secondary | ICD-10-CM | POA: Diagnosis not present

## 2018-08-05 DIAGNOSIS — M810 Age-related osteoporosis without current pathological fracture: Secondary | ICD-10-CM

## 2018-08-05 DIAGNOSIS — T451X5A Adverse effect of antineoplastic and immunosuppressive drugs, initial encounter: Secondary | ICD-10-CM

## 2018-08-05 DIAGNOSIS — I10 Essential (primary) hypertension: Secondary | ICD-10-CM

## 2018-08-05 DIAGNOSIS — Z5112 Encounter for antineoplastic immunotherapy: Secondary | ICD-10-CM | POA: Diagnosis not present

## 2018-08-05 DIAGNOSIS — G629 Polyneuropathy, unspecified: Secondary | ICD-10-CM

## 2018-08-05 DIAGNOSIS — G620 Drug-induced polyneuropathy: Secondary | ICD-10-CM

## 2018-08-05 LAB — CBC WITH DIFFERENTIAL/PLATELET
Abs Immature Granulocytes: 0.03 10*3/uL (ref 0.00–0.07)
Basophils Absolute: 0.1 10*3/uL (ref 0.0–0.1)
Basophils Relative: 1 %
Eosinophils Absolute: 0.2 10*3/uL (ref 0.0–0.5)
Eosinophils Relative: 3 %
HCT: 34.4 % — ABNORMAL LOW (ref 36.0–46.0)
Hemoglobin: 11.2 g/dL — ABNORMAL LOW (ref 12.0–15.0)
Immature Granulocytes: 0 %
Lymphocytes Relative: 31 %
Lymphs Abs: 2.2 10*3/uL (ref 0.7–4.0)
MCH: 31.5 pg (ref 26.0–34.0)
MCHC: 32.6 g/dL (ref 30.0–36.0)
MCV: 96.9 fL (ref 80.0–100.0)
Monocytes Absolute: 0.6 10*3/uL (ref 0.1–1.0)
Monocytes Relative: 9 %
Neutro Abs: 4.1 10*3/uL (ref 1.7–7.7)
Neutrophils Relative %: 56 %
Platelets: 347 10*3/uL (ref 150–400)
RBC: 3.55 MIL/uL — ABNORMAL LOW (ref 3.87–5.11)
RDW: 15.2 % (ref 11.5–15.5)
WBC: 7.3 10*3/uL (ref 4.0–10.5)
nRBC: 0 % (ref 0.0–0.2)

## 2018-08-05 LAB — COMPREHENSIVE METABOLIC PANEL
ALT: 25 U/L (ref 0–44)
AST: 27 U/L (ref 15–41)
Albumin: 3.5 g/dL (ref 3.5–5.0)
Alkaline Phosphatase: 107 U/L (ref 38–126)
Anion gap: 8 (ref 5–15)
BUN: 13 mg/dL (ref 8–23)
CO2: 25 mmol/L (ref 22–32)
Calcium: 8.6 mg/dL — ABNORMAL LOW (ref 8.9–10.3)
Chloride: 108 mmol/L (ref 98–111)
Creatinine, Ser: 0.59 mg/dL (ref 0.44–1.00)
GFR calc Af Amer: >60 mL/min (ref >60)
GFR calc non Af Amer: >60 mL/min (ref >60)
Glucose, Bld: 94 mg/dL (ref 70–99)
Potassium: 4 mmol/L (ref 3.5–5.1)
Sodium: 141 mmol/L (ref 135–145)
Total Bilirubin: 0.6 mg/dL (ref 0.3–1.2)
Total Protein: 6.3 g/dL — ABNORMAL LOW (ref 6.5–8.1)

## 2018-08-05 LAB — MAGNESIUM: Magnesium: 2.3 mg/dL (ref 1.7–2.4)

## 2018-08-05 MED ORDER — SODIUM CHLORIDE 0.9 % IV SOLN
20.0000 mg | Freq: Once | INTRAVENOUS | Status: AC
  Administered 2018-08-05: 20 mg via INTRAVENOUS
  Filled 2018-08-05: qty 2

## 2018-08-05 MED ORDER — TRASTUZUMAB CHEMO 150 MG IV SOLR
150.0000 mg | Freq: Once | INTRAVENOUS | Status: AC
  Administered 2018-08-05: 150 mg via INTRAVENOUS
  Filled 2018-08-05: qty 7.14

## 2018-08-05 MED ORDER — ACETAMINOPHEN 325 MG PO TABS
650.0000 mg | ORAL_TABLET | Freq: Once | ORAL | Status: AC
  Administered 2018-08-05: 650 mg via ORAL
  Filled 2018-08-05: qty 2

## 2018-08-05 MED ORDER — FAMOTIDINE IN NACL 20-0.9 MG/50ML-% IV SOLN
20.0000 mg | Freq: Once | INTRAVENOUS | Status: AC
  Administered 2018-08-05: 20 mg via INTRAVENOUS
  Filled 2018-08-05: qty 50

## 2018-08-05 MED ORDER — HEPARIN SOD (PORK) LOCK FLUSH 100 UNIT/ML IV SOLN
500.0000 [IU] | Freq: Once | INTRAVENOUS | Status: AC
  Administered 2018-08-05: 500 [IU] via INTRAVENOUS

## 2018-08-05 MED ORDER — SODIUM CHLORIDE 0.9% FLUSH
10.0000 mL | INTRAVENOUS | Status: DC | PRN
  Filled 2018-08-05: qty 10

## 2018-08-05 MED ORDER — SODIUM CHLORIDE 0.9 % IV SOLN
80.0000 mg/m2 | Freq: Once | INTRAVENOUS | Status: AC
  Administered 2018-08-05: 150 mg via INTRAVENOUS
  Filled 2018-08-05: qty 25

## 2018-08-05 MED ORDER — SODIUM CHLORIDE 0.9 % IV SOLN
Freq: Once | INTRAVENOUS | Status: AC
  Administered 2018-08-05: 11:00:00 via INTRAVENOUS
  Filled 2018-08-05: qty 250

## 2018-08-05 MED ORDER — DIPHENHYDRAMINE HCL 50 MG/ML IJ SOLN
50.0000 mg | Freq: Once | INTRAMUSCULAR | Status: AC
  Administered 2018-08-05: 50 mg via INTRAVENOUS
  Filled 2018-08-05: qty 1

## 2018-08-05 NOTE — Patient Instructions (Signed)
Paclitaxel injection What is this medicine? PACLITAXEL (PAK li TAX el) is a chemotherapy drug. It targets fast dividing cells, like cancer cells, and causes these cells to die. This medicine is used to treat ovarian cancer, breast cancer, lung cancer, Kaposi's sarcoma, and other cancers. This medicine may be used for other purposes; ask your health care provider or pharmacist if you have questions. COMMON BRAND NAME(S): Onxol, Taxol What should I tell my health care provider before I take this medicine? They need to know if you have any of these conditions: -history of irregular heartbeat -liver disease -low blood counts, like low white cell, platelet, or red cell counts -lung or breathing disease, like asthma -tingling of the fingers or toes, or other nerve disorder -an unusual or allergic reaction to paclitaxel, alcohol, polyoxyethylated castor oil, other chemotherapy, other medicines, foods, dyes, or preservatives -pregnant or trying to get pregnant -breast-feeding How should I use this medicine? This drug is given as an infusion into a vein. It is administered in a hospital or clinic by a specially trained health care professional. Talk to your pediatrician regarding the use of this medicine in children. Special care may be needed. Overdosage: If you think you have taken too much of this medicine contact a poison control center or emergency room at once. NOTE: This medicine is only for you. Do not share this medicine with others. What if I miss a dose? It is important not to miss your dose. Call your doctor or health care professional if you are unable to keep an appointment. What may interact with this medicine? Do not take this medicine with any of the following medications: -disulfiram -metronidazole This medicine may also interact with the following medications: -antiviral medicines for hepatitis, HIV or AIDS -certain antibiotics like erythromycin and clarithromycin -certain  medicines for fungal infections like ketoconazole and itraconazole -certain medicines for seizures like carbamazepine, phenobarbital, phenytoin -gemfibrozil -nefazodone -rifampin -St. John's wort This list may not describe all possible interactions. Give your health care provider a list of all the medicines, herbs, non-prescription drugs, or dietary supplements you use. Also tell them if you smoke, drink alcohol, or use illegal drugs. Some items may interact with your medicine. What should I watch for while using this medicine? Your condition will be monitored carefully while you are receiving this medicine. You will need important blood work done while you are taking this medicine. This medicine can cause serious allergic reactions. To reduce your risk you will need to take other medicine(s) before treatment with this medicine. If you experience allergic reactions like skin rash, itching or hives, swelling of the face, lips, or tongue, tell your doctor or health care professional right away. In some cases, you may be given additional medicines to help with side effects. Follow all directions for their use. This drug may make you feel generally unwell. This is not uncommon, as chemotherapy can affect healthy cells as well as cancer cells. Report any side effects. Continue your course of treatment even though you feel ill unless your doctor tells you to stop. Call your doctor or health care professional for advice if you get a fever, chills or sore throat, or other symptoms of a cold or flu. Do not treat yourself. This drug decreases your body's ability to fight infections. Try to avoid being around people who are sick. This medicine may increase your risk to bruise or bleed. Call your doctor or health care professional if you notice any unusual bleeding. Be careful brushing   your body's ability to fight infections. Try to avoid being around people who are sick.  This medicine may increase your risk to bruise or bleed. Call your doctor or health care professional if you notice any unusual bleeding.  Be careful brushing and flossing your teeth or using a toothpick because you may get an infection or bleed more easily. If you have any dental work  done, tell your dentist you are receiving this medicine.  Avoid taking products that contain aspirin, acetaminophen, ibuprofen, naproxen, or ketoprofen unless instructed by your doctor. These medicines may hide a fever.  Do not become pregnant while taking this medicine. Women should inform their doctor if they wish to become pregnant or think they might be pregnant. There is a potential for serious side effects to an unborn child. Talk to your health care professional or pharmacist for more information. Do not breast-feed an infant while taking this medicine.  Men are advised not to father a child while receiving this medicine.  This product may contain alcohol. Ask your pharmacist or healthcare provider if this medicine contains alcohol. Be sure to tell all healthcare providers you are taking this medicine. Certain medicines, like metronidazole and disulfiram, can cause an unpleasant reaction when taken with alcohol. The reaction includes flushing, headache, nausea, vomiting, sweating, and increased thirst. The reaction can last from 30 minutes to several hours.  What side effects may I notice from receiving this medicine?  Side effects that you should report to your doctor or health care professional as soon as possible:  -allergic reactions like skin rash, itching or hives, swelling of the face, lips, or tongue  -breathing problems  -changes in vision  -fast, irregular heartbeat  -high or low blood pressure  -mouth sores  -pain, tingling, numbness in the hands or feet  -signs of decreased platelets or bleeding - bruising, pinpoint red spots on the skin, black, tarry stools, blood in the urine  -signs of decreased red blood cells - unusually weak or tired, feeling faint or lightheaded, falls  -signs of infection - fever or chills, cough, sore throat, pain or difficulty passing urine  -signs and symptoms of liver injury like dark yellow or brown urine; general ill feeling or flu-like symptoms; light-colored  stools; loss of appetite; nausea; right upper belly pain; unusually weak or tired; yellowing of the eyes or skin  -swelling of the ankles, feet, hands  -unusually slow heartbeat  Side effects that usually do not require medical attention (report to your doctor or health care professional if they continue or are bothersome):  -diarrhea  -hair loss  -loss of appetite  -muscle or joint pain  -nausea, vomiting  -pain, redness, or irritation at site where injected  -tiredness  This list may not describe all possible side effects. Call your doctor for medical advice about side effects. You may report side effects to FDA at 1-800-FDA-1088.  Where should I keep my medicine?  This drug is given in a hospital or clinic and will not be stored at home.  NOTE: This sheet is a summary. It may not cover all possible information. If you have questions about this medicine, talk to your doctor, pharmacist, or health care provider.   2019 Elsevier/Gold Standard (2017-01-29 13:14:55)  Trastuzumab injection for infusion  What is this medicine?  TRASTUZUMAB (tras TOO zoo mab) is a monoclonal antibody. It is used to treat breast cancer and stomach cancer.  This medicine may be used for other purposes; ask your health care   provider or pharmacist if you have questions.  COMMON BRAND NAME(S): Herceptin, KANJINTI, OGIVRI  What should I tell my health care provider before I take this medicine?  They need to know if you have any of these conditions:  -heart disease  -heart failure  -lung or breathing disease, like asthma  -an unusual or allergic reaction to trastuzumab, benzyl alcohol, or other medications, foods, dyes, or preservatives  -pregnant or trying to get pregnant  -breast-feeding  How should I use this medicine?  This drug is given as an infusion into a vein. It is administered in a hospital or clinic by a specially trained health care professional.  Talk to your pediatrician regarding the use of this medicine in children. This  medicine is not approved for use in children.  Overdosage: If you think you have taken too much of this medicine contact a poison control center or emergency room at once.  NOTE: This medicine is only for you. Do not share this medicine with others.  What if I miss a dose?  It is important not to miss a dose. Call your doctor or health care professional if you are unable to keep an appointment.  What may interact with this medicine?  This medicine may interact with the following medications:  -certain types of chemotherapy, such as daunorubicin, doxorubicin, epirubicin, and idarubicin  This list may not describe all possible interactions. Give your health care provider a list of all the medicines, herbs, non-prescription drugs, or dietary supplements you use. Also tell them if you smoke, drink alcohol, or use illegal drugs. Some items may interact with your medicine.  What should I watch for while using this medicine?  Visit your doctor for checks on your progress. Report any side effects. Continue your course of treatment even though you feel ill unless your doctor tells you to stop.  Call your doctor or health care professional for advice if you get a fever, chills or sore throat, or other symptoms of a cold or flu. Do not treat yourself. Try to avoid being around people who are sick.  You may experience fever, chills and shaking during your first infusion. These effects are usually mild and can be treated with other medicines. Report any side effects during the infusion to your health care professional. Fever and chills usually do not happen with later infusions.  Do not become pregnant while taking this medicine or for 7 months after stopping it. Women should inform their doctor if they wish to become pregnant or think they might be pregnant. Women of child-bearing potential will need to have a negative pregnancy test before starting this medicine. There is a potential for serious side effects to an unborn  child. Talk to your health care professional or pharmacist for more information. Do not breast-feed an infant while taking this medicine or for 7 months after stopping it.  Women must use effective birth control with this medicine.  What side effects may I notice from receiving this medicine?  Side effects that you should report to your doctor or health care professional as soon as possible:  -allergic reactions like skin rash, itching or hives, swelling of the face, lips, or tongue  -chest pain or palpitations  -cough  -dizziness  -feeling faint or lightheaded, falls  -fever  -general ill feeling or flu-like symptoms  -signs of worsening heart failure like breathing problems; swelling in your legs and feet  -unusually weak or tired  Side effects that usually do not

## 2018-08-05 NOTE — Progress Notes (Signed)
[facility] day:  08/05/2018  Chief Complaint: [name] [name] is a 85 y.o. female with stage I Her2/neu + breast cancer who is seen for assessment prior to week #12 of Herceptin and Taxol.  HPI:  The patient was last seen in the medical oncology clinic on 07/29/2018.  At that time,  she denied any change in her neuropathy.  Exam revealed chronic right lower extremity edema.    She received week #11 Herceptin and Taxol.  During the interim, she notes no problems.  She denies any worsening neuropathy.  Energy level is "ok".  She missed her echo secondary to the snow.   Past Medical History:  Diagnosis Date  . Diverticulitis of sigmoid colon   . Endometriosis   . Fibrocystic breast disease   . Glaucoma   . H/O urinary incontinence   . History of chicken pox   . History of colon polyps 2014  . Hyperlipidemia   . Hypertension   . Ovarian cancer ([place]) 2002   Chemo tx's @ [facility] with Total Hysterectomy. [name] [name] [name] [name]  . Personal history of chemotherapy    ovarian cancer  . Port-A-Cath in place   . Sinusitis   . Tuberculosis    positve skin test    Past Surgical History:  Procedure Laterality Date  . ABDOMINAL HYSTERECTOMY  2003   complete [name] [name] [name] [name]  . ABSCESS DRAINAGE  2017   right buttock at [facility]  . APPENDECTOMY  2003  . BREAST EXCISIONAL BIOPSY Left 1993   excisional bx. negative results  . BREAST SURGERY Left 1982   papilloma removal  . COLONOSCOPY  2006, 2014   [name] [name] [name] [name]   . COLONOSCOPY N/A 03/27/2016   Procedure: COLONOSCOPY;  Surgeon: [name] Bellow, [name] [name];  Location: [facility] ENDOSCOPY;  Service: Endoscopy;  Laterality: N/A;  . COLOSTOMY REVERSAL  2014   at [place] [name] [name] [name] [name]  . NASAL SINUS SURGERY     For fungal infection  . NASAL SINUS SURGERY  8/05 8/06  . OOPHORECTOMY    . OSTOMY  05/22/2012   diverticulitis with abcess/ [name] [name] [name] Julian  . ovarian cancer     Exploratory lap  . PORTA CATH INSERTION N/A 05/26/2018   Procedure: PORTA CATH INSERTION;  Surgeon: [name] Huxley, [name] [name];  Location: [place] [facility] LAB;  Service: Cardiovascular;  Laterality: N/A;  . TONSILLECTOMY AND ADENOIDECTOMY  1947    Family History  Problem Relation Age of Onset  . Hypertension Mother   . Coronary artery disease Mother   . Heart disease Mother   . Kidney disease Mother   . Diabetes Mother   . Cancer Mother        uterine cancer  . Diabetes Father   . Kidney failure Father   . Diabetes Brother   . Coronary artery disease Brother   . Cancer Brother        bladder cancer  . Stroke Sister   . Heart disease Sister        [facility]  . Cancer Maternal Aunt        ovarian cancer  . Breast cancer Cousin   . Cancer Brother        Prostate  . Breast cancer Cousin   . Breast cancer Cousin     Social History:  reports that she has never smoked. She has never used smokeless tobacco. She reports that she does not drink alcohol or use drugs.  Patient is a  retired Engineer, site.The patient is accompanied by her friend, [name] [name], today.  Allergies: No Known Allergies  Current Medications: Current Outpatient Medications  Medication Sig Dispense Refill  . amLODipine (NORVASC) 2.5 MG tablet Take 1 tablet (2.5 mg total) by mouth daily. 30 tablet 2  . Calcium Carbonate-Vitamin D 600-400 MG-UNIT tablet Take 1 tablet by mouth daily.     Marland Kitchen lidocaine-prilocaine (EMLA) cream Apply 1 application topically as needed. 30 g 5  . prochlorperazine (COMPAZINE) 10 MG tablet Take 1 tablet (10 mg total) by mouth every 6 (six) hours as needed for nausea or vomiting. 30 tablet 3   Current Facility-Administered Medications  Medication Dose Route Frequency Provider Last Rate Last Dose  . lidocaine-prilocaine (EMLA) cream   Topical PRN [name] [name], [name] Squibb, [name] [name]       Facility-Administered Medications Ordered in Other Visits  Medication Dose Route Frequency Provider Last Rate Last Dose  . heparin lock flush 100 unit/mL  500 Units Intravenous  Once [name] [name], [name] [name], [name] [name]      . sodium chloride 0.9 % 50 mL with famotidine (PEPCID) 20 mg infusion  20 mg Intravenous Once [name] Dalton [name], [name] [name]      . sodium chloride flush (NS) 0.9 % injection 10 mL  10 mL Intravenous PRN [name] Sickle, [name] [name]   10 mL at 06/24/18 0854  . sodium chloride flush (NS) 0.9 % injection 10 mL  10 mL Intracatheter PRN [name] Sickle, [name] [name]   10 mL at 07/01/18 0830  . sodium chloride flush (NS) 0.9 % injection 10 mL  10 mL Intravenous PRN Lequita Asal, [name] [name]        Review of Systems:  GENERAL:  Feels "ok".  No probelms.  No fevers, sweats.  Weight down 2 pounds. PERFORMANCE STATUS (ECOG):  1 HEENT:  No visual changes, runny nose, sore throat, mouth sores or tenderness. Lungs: No shortness of breath or cough.  No hemoptysis. Cardiac:  No chest pain, palpitations, orthopnea, or PND. GI:  No nausea, vomiting, diarrhea, constipation, melena or hematochezia. GU:  No urgency, frequency, dysuria, or hematuria. Musculoskeletal:  No back pain.  No joint pain.  No muscle tenderness. Extremities:  No pain.  Chronic lower extremity swelling. Skin:  No rashes or skin changes. Neuro:  Neuropathy in feet (stable).  No headache, focal weakness, balance or coordination issues. Endocrine:  No diabetes, thyroid issues, hot flashes or night sweats. Psych:  No mood changes, depression or anxiety.  Poor sleep (meeting last night). Pain:  No focal pain. Review of systems:  All other systems reviewed and found to be negative.   Physical Exam: Blood pressure 133/71, pulse 75, temperature 98.6 F (37 [name]), temperature source Tympanic, resp. rate 18, height _0  (1.6 m), weight 165 lb 5.5 oz (75 kg), SpO2 97 %. GENERAL:  Well developed, well nourished, woman sitting comfortably in the exam room in no acute distress. MENTAL STATUS:  Alert and oriented to person, place and time. HEAD:  Wearing a blue hat.  Normocephalic, atraumatic, face symmetric, no Cushingoid  features. EYES:  Glasses.  Brown eyes.  Pupils equal round and reactive to light and accomodation.  No conjunctivitis or scleral icterus. ENT:  Oropharynx clear without lesion.  Tongue normal. Mucous membranes moist.  RESPIRATORY:  Clear to auscultation without rales, wheezes or rhonchi. CARDIOVASCULAR:  Regular rate and rhythm without murmur, rub or gallop. ABDOMEN:  Soft, non-tender, with active bowel sounds, and no hepatosplenomegaly.  No masses. SKIN:  No rashes,  ulcers or lesions. EXTREMITIES: No edema, no skin discoloration or tenderness.  No palpable cords. LYMPH NODES: No palpable cervical, supraclavicular, axillary or inguinal adenopathy  NEUROLOGICAL: Unremarkable. PSYCH:  Appropriate.    Infusion on 08/05/2018  Component Date Value Ref Range Status  . Magnesium 08/05/2018 2.3  1.7 - 2.4 mg/dL Final   Performed at [facility], [address] [place], [place] [address]  . Sodium 08/05/2018 141  135 - 145 mmol/L Final  . Potassium 08/05/2018 4.0  3.5 - 5.1 mmol/L Final  . Chloride 08/05/2018 108  98 - 111 mmol/L Final  . CO2 08/05/2018 25  22 - 32 mmol/L Final  . Glucose, Bld 08/05/2018 94  70 - 99 mg/dL Final  . BUN 08/05/2018 13  8 - 23 mg/dL Final  . Creatinine, Ser 08/05/2018 0.59  0.44 - 1.00 mg/dL Final  . Calcium 08/05/2018 8.6* 8.9 - 10.3 mg/dL Final  . Total Protein 08/05/2018 6.3* 6.5 - 8.1 g/dL Final  . Albumin 08/05/2018 3.5  3.5 - 5.0 g/dL Final  . AST 08/05/2018 27  15 - 41 U/L Final  . ALT 08/05/2018 25  0 - 44 U/L Final  . Alkaline Phosphatase 08/05/2018 107  38 - 126 U/L Final  . Total Bilirubin 08/05/2018 0.6  0.3 - 1.2 mg/dL Final  . GFR calc non Af Amer 08/05/2018 >60  >60 mL/min Final  . GFR calc Af Amer 08/05/2018 >60  >60 mL/min Final  . Anion gap 08/05/2018 8  5 - 15 Final   Performed at [facility], [address], [place], [place] [address]  . WBC 08/05/2018 7.3  4.0 - 10.5 K/uL Final  . RBC 08/05/2018 3.55* 3.87 -  5.11 MIL/uL Final  . Hemoglobin 08/05/2018 11.2* 12.0 - 15.0 g/dL Final  . HCT 08/05/2018 34.4* 36.0 - 46.0 % Final  . MCV 08/05/2018 96.9  80.0 - 100.0 fL Final  . MCH 08/05/2018 31.5  26.0 - 34.0 pg Final  . MCHC 08/05/2018 32.6  30.0 - 36.0 g/dL Final  . RDW 08/05/2018 15.2  11.5 - 15.5 % Final  . Platelets 08/05/2018 347  150 - 400 K/uL Final  . nRBC 08/05/2018 0.0  0.0 - 0.2 % Final  . Neutrophils Relative % 08/05/2018 56  % Final  . Neutro Abs 08/05/2018 4.1  1.7 - 7.7 K/uL Final  . Lymphocytes Relative 08/05/2018 31  % Final  . Lymphs Abs 08/05/2018 2.2  0.7 - 4.0 K/uL Final  . Monocytes Relative 08/05/2018 9  % Final  . Monocytes Absolute 08/05/2018 0.6  0.1 - 1.0 K/uL Final  . Eosinophils Relative 08/05/2018 3  % Final  . Eosinophils Absolute 08/05/2018 0.2  0.0 - 0.5 K/uL Final  . Basophils Relative 08/05/2018 1  % Final  . Basophils Absolute 08/05/2018 0.1  0.0 - 0.1 K/uL Final  . Immature Granulocytes 08/05/2018 0  % Final  . Abs Immature Granulocytes 08/05/2018 0.03  0.00 - 0.07 K/uL Final   Performed at [facility], [name], [place], [place] [address]    Assessment:  [name] [name] is a 85 y.o. female with stage I Her2/neu + breast cancer s/p lumpectomy and sentinel lymph node biopsy on 03/18/2018.  Pathology revealed a 0.6 cm grade III invasive mammary carcinoma.  There was DCIS.  There was no lymphovascular invasion.  Tumor was ER + (99%), PR + (25%), and Her2/neu + by FISH.  Pathologic stage was T1bN0.  Screening  mammogram on 01/17/2018 revealed calcifications and possible mass in the right breast.  Diagnostic mammogram and ultrasound on 01/22/2018 revealed indeterminate group of microcalcifications over the right upper outer quadrant spanning 4 x 9 x 11 mm.  There was a 4 x 7 x 7 mm probable complicated cyst at the 11 o'clock position of the right breast 5 cm from the nipple.  She is s/p week #11 Herceptin and Taxol (05/20/2018 - 07/29/2017).  Echo  at [facility] on 03/12/2018 revealed an EF of 61%.  He has a history of stage IIB ovarian cancer s/p exploratory laparotomy, TAH/BSO, omentectomy, peritoneal biopsies, selective pelvic and periaortic lymph node sampling on 06/20/2001.  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  Uterine serosa and the surface of the contralateral ovary were positive for metastatic disease. The primary tumor was approximately 15 cm in diameter.  She received 6 cycles of carboplatin and Taxol.  Bone density on 04/01/2017 revealed osteoporosis with a T-score of -2.5 in the left femoral neck.  She has grade I neuropathy in her feet.  She has chronic right lower extremity edema.  Right lower extremity duplex on 07/22/2018 revealed no femoropopliteal and no calf DVT in the visualized calf veins.  Symptomatically, she is doing well.  Neuropathy in her feet are stable.  Plan: 1.   Labs today:  CBC with diff, CMP, Mg. 2.   Stage I Her2/neu + breast cancer  Week #12 Herceptin and Taxol today.  Weekly Taxol completes today.  Discuss plan for continuation of Herceptin to complete a year of adjuvant therapy.  Discuss continuation of weekly Herceptin or every 3 weeks Herceptin.     Patient wishes to try every 3 week Herceptin.  Discuss need for echocardiogram prior to next Herceptin and every 3 months until completion of treatment.  Discuss plan for radiation approximately 4 weeks s/p completion of Taxol.  Discuss  5 years of endocrine therapy (tamoxifen versus aromatase inhibitor) after completion of radiation.  Discuss genetic testing given patient's history of breast and ovarian cancer + family history.   Patient does not have children, but has nieces and nephews.   Patient spoke with Ferol Luz, genetics counselor on 07/31/2018. 3.   Grade I neuropathy in feet  Neuropathy remains stable.  Complete week #12 Taxol. 4.   Osteoporosis  Encourage calcium and vitamin D.  Consider Prolia.  Side  effects previously reviewed.   Dental clearance needed. 5.   Reschedule echo (missed secondary to snow; needs before next appt). 6.   RTC in 3 weeks for [name] [name] assessment, labs (CBC with diff, CMP, Mg), review of echo, and Herceptin.   Lequita Asal, [name] [name]  08/05/2018, 10:14 AM

## 2018-08-05 NOTE — Progress Notes (Signed)
No new changes noted today 

## 2018-08-15 DIAGNOSIS — C50411 Malignant neoplasm of upper-outer quadrant of right female breast: Secondary | ICD-10-CM | POA: Diagnosis not present

## 2018-08-15 DIAGNOSIS — Z170 Estrogen receptor positive status [ER+]: Secondary | ICD-10-CM | POA: Diagnosis not present

## 2018-08-18 ENCOUNTER — Ambulatory Visit
Admission: RE | Admit: 2018-08-18 | Discharge: 2018-08-18 | Disposition: A | Discharge status: Home / Self Care | Payer: Medicare HMO | Source: Ambulatory Visit | Attending: Urgent Care | Admitting: Urgent Care

## 2018-08-18 DIAGNOSIS — E785 Hyperlipidemia, unspecified: Secondary | ICD-10-CM | POA: Insufficient documentation

## 2018-08-18 DIAGNOSIS — Z9189 Other specified personal risk factors, not elsewhere classified: Secondary | ICD-10-CM | POA: Insufficient documentation

## 2018-08-18 DIAGNOSIS — Z09 Encounter for follow-up examination after completed treatment for conditions other than malignant neoplasm: Secondary | ICD-10-CM | POA: Diagnosis not present

## 2018-08-18 DIAGNOSIS — I10 Essential (primary) hypertension: Secondary | ICD-10-CM | POA: Insufficient documentation

## 2018-08-18 DIAGNOSIS — Z5111 Encounter for antineoplastic chemotherapy: Secondary | ICD-10-CM

## 2018-08-18 DIAGNOSIS — Z5112 Encounter for antineoplastic immunotherapy: Secondary | ICD-10-CM

## 2018-08-18 NOTE — Progress Notes (Signed)
*  PRELIMINARY RESULTS* Echocardiogram 2D Echocardiogram has been performed.  [name] [name] 08/18/2018, 11:38 AM

## 2018-08-26 ENCOUNTER — Other Ambulatory Visit: Payer: Self-pay

## 2018-08-26 ENCOUNTER — Inpatient Hospital Stay: Payer: Medicare HMO | Attending: Hematology and Oncology | Admitting: Urgent Care

## 2018-08-26 ENCOUNTER — Inpatient Hospital Stay: Payer: Medicare HMO

## 2018-08-26 VITALS — BP 152/74 | HR 80 | Temp 98.5°F | Resp 16 | Wt 167.9 lb

## 2018-08-26 DIAGNOSIS — Z8543 Personal history of malignant neoplasm of ovary: Secondary | ICD-10-CM | POA: Diagnosis not present

## 2018-08-26 DIAGNOSIS — Z170 Estrogen receptor positive status [ER+]: Principal | ICD-10-CM

## 2018-08-26 DIAGNOSIS — M810 Age-related osteoporosis without current pathological fracture: Secondary | ICD-10-CM | POA: Diagnosis not present

## 2018-08-26 DIAGNOSIS — Z5112 Encounter for antineoplastic immunotherapy: Secondary | ICD-10-CM | POA: Insufficient documentation

## 2018-08-26 DIAGNOSIS — C50411 Malignant neoplasm of upper-outer quadrant of right female breast: Secondary | ICD-10-CM

## 2018-08-26 DIAGNOSIS — G620 Drug-induced polyneuropathy: Secondary | ICD-10-CM

## 2018-08-26 DIAGNOSIS — Z5111 Encounter for antineoplastic chemotherapy: Secondary | ICD-10-CM

## 2018-08-26 DIAGNOSIS — I889 Nonspecific lymphadenitis, unspecified: Secondary | ICD-10-CM | POA: Diagnosis not present

## 2018-08-26 DIAGNOSIS — R05 Cough: Secondary | ICD-10-CM | POA: Diagnosis not present

## 2018-08-26 DIAGNOSIS — T451X5A Adverse effect of antineoplastic and immunosuppressive drugs, initial encounter: Secondary | ICD-10-CM

## 2018-08-26 DIAGNOSIS — L040 Acute lymphadenitis of face, head and neck: Secondary | ICD-10-CM

## 2018-08-26 DIAGNOSIS — R059 Cough, unspecified: Secondary | ICD-10-CM

## 2018-08-26 LAB — CBC WITH DIFFERENTIAL/PLATELET
Abs Immature Granulocytes: 0.03 10*3/uL (ref 0.00–0.07)
Basophils Absolute: 0.1 10*3/uL (ref 0.0–0.1)
Basophils Relative: 1 %
Eosinophils Absolute: 0 10*3/uL (ref 0.0–0.5)
Eosinophils Relative: 0 %
HCT: 32.5 % — ABNORMAL LOW (ref 36.0–46.0)
Hemoglobin: 10.2 g/dL — ABNORMAL LOW (ref 12.0–15.0)
Immature Granulocytes: 0 %
Lymphocytes Relative: 21 %
Lymphs Abs: 2.5 10*3/uL (ref 0.7–4.0)
MCH: 30.3 pg (ref 26.0–34.0)
MCHC: 31.4 g/dL (ref 30.0–36.0)
MCV: 96.4 fL (ref 80.0–100.0)
Monocytes Absolute: 1.6 10*3/uL — ABNORMAL HIGH (ref 0.1–1.0)
Monocytes Relative: 13 %
Neutro Abs: 7.6 10*3/uL (ref 1.7–7.7)
Neutrophils Relative %: 65 %
Platelets: 299 10*3/uL (ref 150–400)
RBC: 3.37 MIL/uL — ABNORMAL LOW (ref 3.87–5.11)
RDW: 14.7 % (ref 11.5–15.5)
WBC: 11.8 10*3/uL — ABNORMAL HIGH (ref 4.0–10.5)
nRBC: 0 % (ref 0.0–0.2)

## 2018-08-26 LAB — COMPREHENSIVE METABOLIC PANEL
ALT: 20 U/L (ref 0–44)
AST: 20 U/L (ref 15–41)
Albumin: 3.2 g/dL — ABNORMAL LOW (ref 3.5–5.0)
Alkaline Phosphatase: 90 U/L (ref 38–126)
Anion gap: 7 (ref 5–15)
BUN: 12 mg/dL (ref 8–23)
CO2: 25 mmol/L (ref 22–32)
Calcium: 8.6 mg/dL — ABNORMAL LOW (ref 8.9–10.3)
Chloride: 105 mmol/L (ref 98–111)
Creatinine, Ser: 0.59 mg/dL (ref 0.44–1.00)
GFR calc Af Amer: >60 mL/min (ref >60)
GFR calc non Af Amer: >60 mL/min (ref >60)
Glucose, Bld: 95 mg/dL (ref 70–99)
Potassium: 3.6 mmol/L (ref 3.5–5.1)
Sodium: 137 mmol/L (ref 135–145)
Total Bilirubin: 0.6 mg/dL (ref 0.3–1.2)
Total Protein: 6.4 g/dL — ABNORMAL LOW (ref 6.5–8.1)

## 2018-08-26 LAB — MAGNESIUM: Magnesium: 2.1 mg/dL (ref 1.7–2.4)

## 2018-08-26 MED ORDER — SODIUM CHLORIDE 0.9% FLUSH
10.0000 mL | INTRAVENOUS | Status: DC | PRN
  Administered 2018-08-26: 10 mL via INTRAVENOUS
  Filled 2018-08-26: qty 10

## 2018-08-26 MED ORDER — AMOXICILLIN-POT CLAVULANATE 875-125 MG PO TABS: 1.0000 | ORAL_TABLET | Freq: Two times a day (BID) | ORAL | 0 refills | Status: AC

## 2018-08-26 MED ORDER — HEPARIN SOD (PORK) LOCK FLUSH 100 UNIT/ML IV SOLN
500.0000 [IU] | Freq: Once | INTRAVENOUS | Status: AC
  Administered 2018-08-26: 500 [IU] via INTRAVENOUS

## 2018-08-26 NOTE — Progress Notes (Signed)
[facility] [address], [place], [place] [address] Phone: [phone]  Fax: [phone]    Clinic day:  08/26/2018  Chief Complaint: [name] [name] is a 85 y.o. female with stage I Her2/neu + breast cancer who is seen for assessment prior to cycle #13 of Herceptin (every 21 days).  HPI:  The patient was last seen in the medical oncology clinic on 08/05/2018.  At that time patient was doing well.  She denied any acute concerns.  No new or worsening neuropathy.  Energy was described as "okay".  Exam was stable.  WBC 7300 ([place] 4100).  Hemoglobin 11.2, hematocrit 34.4, and platelets 347,000.  Calcium low at 8.6 mg/dL.  Patient received week #12 Herceptin + paclitaxel on 08/05/2018.  Weekly paclitaxel regimen completed.  Patient given the option of continuing weekly Herceptin versus pursuing treatment every 3 weeks.  Patient noted that she would rather pursue the every 3-week treatment option.  She underwent repeat echocardiogram on 08/18/2018 that revealed an LVEF of 50 to 55%.  There was a trivial pericardial effusion present on exam.  In the interim, patient has had a nonproductive cough for the last 2 to 3 weeks.  She denies any associated fevers or increased shortness of breath. Patient denies pleuritic chest pain elicited by both normal and deep inspiration. Patient developed significant swelling to the RIGHT side of her neck 2 days ago.  Patient notes that area is tender to touch.  Patient experiences increased pain with palpation of the area, lateral rotation of her neck, and coughing.  She denies any sensation of fullness in her neck.  She has not experienced any dysphagia or dysphonia.  Patient able to eat and drink normally, and is able to handle oral secretions without difficulties. Patient denies that she has experienced any B symptoms.   Patient advises that she maintains an adequate appetite. She is eating well. Weight today is 167 lb 14.1 oz (76.1  kg), which compared to her last visit to the clinic, represents a 2 pound increase.  Patient denies pain in the clinic today.   Past Medical History:  Diagnosis Date   Diverticulitis of sigmoid colon    Endometriosis    Fibrocystic breast disease    Glaucoma    H/O urinary incontinence    History of chicken pox    History of colon polyps 2014   Hyperlipidemia    Hypertension    Ovarian cancer ([place]) 2002   Chemo tx's @ [facility] with Total Hysterectomy. Dr Amalia Hailey   Personal history of chemotherapy    ovarian cancer   Port-A-Cath in place    Sinusitis    Tuberculosis    positve skin test    Past Surgical History:  Procedure Laterality Date   ABDOMINAL HYSTERECTOMY  2003   complete Dr Amalia Hailey   ABSCESS DRAINAGE  2017   right buttock at [facility] EXCISIONAL BIOPSY Left 1993   excisional bx. negative results   BREAST SURGERY Left 1982   papilloma removal   COLONOSCOPY  2006, 2014   Dr Vira Agar    COLONOSCOPY N/A 03/27/2016   Procedure: COLONOSCOPY;  Surgeon: Robert Bellow, MD;  Location: [facility] ENDOSCOPY;  Service: Endoscopy;  Laterality: N/A;   COLOSTOMY REVERSAL  2014   at [facility] Dr Raynaldo Opitz   NASAL SINUS SURGERY     For fungal infection   NASAL SINUS SURGERY  8/05 8/06   OOPHORECTOMY  OSTOMY  05/22/2012   diverticulitis with abcess/ Dr Tamala Julian   ovarian cancer     Exploratory lap   PORTA CATH INSERTION N/A 05/26/2018   Procedure: PORTA CATH INSERTION;  Surgeon: Algernon Huxley, MD;  Location: [facility];  Service: Cardiovascular;  Laterality: N/A;   TONSILLECTOMY AND ADENOIDECTOMY  1947    Family History  Problem Relation Age of Onset   Hypertension Mother    Coronary artery disease Mother    Heart disease Mother    Kidney disease Mother    Diabetes Mother    Cancer Mother        uterine cancer   Diabetes Father    Kidney failure Father    Diabetes Brother    Coronary artery disease  Brother    Cancer Brother        bladder cancer   Stroke Sister    Heart disease Sister        Pace Maker   Cancer Maternal Aunt        ovarian cancer   Breast cancer Cousin    Cancer Brother        Prostate   Breast cancer Cousin    Breast cancer Cousin     Social History:  reports that she has never smoked. She has never used smokeless tobacco. She reports that she does not drink alcohol or use drugs.  Patient is a retired Engineer, site. Generally accompanied to appointments by friend, [name]. The patient is alone today.   Allergies: No Known Allergies  Current Medications: Current Outpatient Medications  Medication Sig Dispense Refill   Calcium Carbonate-Vitamin D 600-400 MG-UNIT tablet Take 1 tablet by mouth daily.      lidocaine-prilocaine (EMLA) cream Apply 1 application topically as needed. 30 g 5   prochlorperazine (COMPAZINE) 10 MG tablet Take 1 tablet (10 mg total) by mouth every 6 (six) hours as needed for nausea or vomiting. 30 tablet 3   amLODipine (NORVASC) 2.5 MG tablet Take 1 tablet (2.5 mg total) by mouth daily. (Patient not taking: Reported on 08/26/2018) 30 tablet 2   amoxicillin-clavulanate (AUGMENTIN) 875-125 MG tablet Take 1 tablet by mouth 2 (two) times daily for 10 days. 20 tablet 0   Current Facility-Administered Medications  Medication Dose Route Frequency Provider Last Rate Last Dose   lidocaine-prilocaine (EMLA) cream   Topical PRN Lucky Cowboy, Erskine Squibb, MD       Facility-Administered Medications Ordered in Other Visits  Medication Dose Route Frequency Provider Last Rate Last Dose   sodium chloride 0.9 % 50 mL with famotidine (PEPCID) 20 mg infusion  20 mg Intravenous Once Charlaine Dalton R, MD       sodium chloride flush (NS) 0.9 % injection 10 mL  10 mL Intravenous PRN Charlaine Dalton R, MD   10 mL at 06/24/18 0854   sodium chloride flush (NS) 0.9 % injection 10 mL  10 mL Intracatheter PRN Cammie Sickle, MD   10 mL  at 07/01/18 0830    Review of Systems  Constitutional: Negative for diaphoresis, fever, malaise/fatigue and weight loss (up 2 pounds).  HENT: Negative.   Eyes: Negative.   Respiratory: Positive for cough and sputum production (white). Negative for hemoptysis and shortness of breath.   Cardiovascular: Negative for chest pain, palpitations, orthopnea, leg swelling (chronic) and PND.  Gastrointestinal: Negative for abdominal pain, blood in stool, constipation, diarrhea, melena, nausea and vomiting.  Genitourinary: Negative for dysuria, frequency, hematuria and urgency.  Musculoskeletal: Negative  for back pain, falls, joint pain and myalgias.  Skin: Negative for itching and rash.       (+) swelling to RIGHT neck  Neurological: Positive for sensory change (stable neuropathy in BILATERAL feet). Negative for dizziness, tremors, weakness and headaches.  Endo/Heme/Allergies: Does not bruise/bleed easily.  Psychiatric/Behavioral: Negative for depression, memory loss and suicidal ideas. The patient is not nervous/anxious and does not have insomnia.   All other systems reviewed and are negative.  Performance status (ECOG): 1 - Symptomatic but completely ambulatory  Vital Signs BP (!) 152/74 (BP Location: Left Arm, Patient Position: Sitting) Comment: Pt. has not been taking BP med x 2-3 weeks   Pulse 80    Temp 98.5 F (36.9 C) (Oral)    Resp 16    Wt 167 lb 14.1 oz (76.1 kg)    SpO2 98%    BMI 29.74 kg/m   Physical Exam  Constitutional: She is oriented to person, place, and time and well-developed, well-nourished, and in no distress.  HENT:  Head: Normocephalic and atraumatic.  Mouth/Throat: Oropharynx is clear and moist and mucous membranes are normal.  Eyes: Pupils are equal, round, and reactive to light. EOM are normal. No scleral icterus.  Neck: Normal range of motion. Neck supple. No tracheal deviation present. No thyromegaly present.  Cardiovascular: Normal rate, regular rhythm, normal  heart sounds and intact distal pulses. Exam reveals no gallop and no friction rub.  No murmur heard. Pulmonary/Chest: Effort normal. No respiratory distress. She has no wheezes. She has rhonchi (clears completely with cough). She has no rales.  Abdominal: Soft. Bowel sounds are normal. She exhibits no distension. There is no abdominal tenderness.  Musculoskeletal: Normal range of motion.        General: No tenderness or edema.  Lymphadenopathy:    She has cervical adenopathy.       Right cervical: Deep cervical adenopathy present.    She has no axillary adenopathy.       Right: No inguinal and no supraclavicular adenopathy present.       Left: No inguinal and no supraclavicular adenopathy present.  Neurological: She is alert and oriented to person, place, and time. A sensory deficit (stable grade I neuropathy in feet) is present.  Skin: Skin is warm and dry. No rash noted. No erythema.  Psychiatric: Mood, affect and judgment normal.  Nursing note and vitals reviewed.   Infusion on 08/26/2018  Component Date Value Ref Range Status   Magnesium 08/26/2018 2.1  1.7 - 2.4 mg/dL Final   Performed at [facility], [address], [place], [place] [address]   Sodium 08/26/2018 137  135 - 145 mmol/L Final   Potassium 08/26/2018 3.6  3.5 - 5.1 mmol/L Final   Chloride 08/26/2018 105  98 - 111 mmol/L Final   CO2 08/26/2018 25  22 - 32 mmol/L Final   Glucose, Bld 08/26/2018 95  70 - 99 mg/dL Final   BUN 08/26/2018 12  8 - 23 mg/dL Final   Creatinine, Ser 08/26/2018 0.59  0.44 - 1.00 mg/dL Final   Calcium 08/26/2018 8.6* 8.9 - 10.3 mg/dL Final   Total Protein 08/26/2018 6.4* 6.5 - 8.1 g/dL Final   Albumin 08/26/2018 3.2* 3.5 - 5.0 g/dL Final   AST 08/26/2018 20  15 - 41 U/L Final   ALT 08/26/2018 20  0 - 44 U/L Final   Alkaline Phosphatase 08/26/2018 90  38 - 126 U/L Final   Total Bilirubin 08/26/2018 0.6  0.3 -  1.2 mg/dL Final   GFR calc non Af Amer 08/26/2018 >60   >60 mL/min Final   GFR calc Af Amer 08/26/2018 >60  >60 mL/min Final   Anion gap 08/26/2018 7  5 - 15 Final   Performed at [facility], [address], [place], [place] [address]   WBC 08/26/2018 11.8* 4.0 - 10.5 K/uL Final   RBC 08/26/2018 3.37* 3.87 - 5.11 MIL/uL Final   Hemoglobin 08/26/2018 10.2* 12.0 - 15.0 g/dL Final   HCT 08/26/2018 32.5* 36.0 - 46.0 % Final   MCV 08/26/2018 96.4  80.0 - 100.0 fL Final   MCH 08/26/2018 30.3  26.0 - 34.0 pg Final   MCHC 08/26/2018 31.4  30.0 - 36.0 g/dL Final   RDW 08/26/2018 14.7  11.5 - 15.5 % Final   Platelets 08/26/2018 299  150 - 400 K/uL Final   nRBC 08/26/2018 0.0  0.0 - 0.2 % Final   Neutrophils Relative % 08/26/2018 65  % Final   Neutro Abs 08/26/2018 7.6  1.7 - 7.7 K/uL Final   Lymphocytes Relative 08/26/2018 21  % Final   Lymphs Abs 08/26/2018 2.5  0.7 - 4.0 K/uL Final   Monocytes Relative 08/26/2018 13  % Final   Monocytes Absolute 08/26/2018 1.6* 0.1 - 1.0 K/uL Final   Eosinophils Relative 08/26/2018 0  % Final   Eosinophils Absolute 08/26/2018 0.0  0.0 - 0.5 K/uL Final   Basophils Relative 08/26/2018 1  % Final   Basophils Absolute 08/26/2018 0.1  0.0 - 0.1 K/uL Final   Immature Granulocytes 08/26/2018 0  % Final   Abs Immature Granulocytes 08/26/2018 0.03  0.00 - 0.07 K/uL Final   Performed at [facility], [address], [place], [place] [address]    Assessment:  [name] [name] is a 85 y.o. female with stage I Her2/neu + breast cancer s/p lumpectomy and sentinel lymph node biopsy on 03/18/2018.  Pathology revealed a 0.6 cm grade III invasive mammary carcinoma.  There was DCIS.  There was no lymphovascular invasion.  Tumor was ER + (99%), PR + (25%), and Her2/neu + by FISH.  Pathologic stage was T1bN0.  Screening mammogram on 01/17/2018 revealed calcifications and possible mass in the right breast.  Diagnostic mammogram and ultrasound on 01/22/2018 revealed indeterminate group  of microcalcifications over the right upper outer quadrant spanning 4 x 9 x 11 mm.  There was a 4 x 7 x 7 mm probable complicated cyst at the 11 o'clock position of the right breast 5 cm from the nipple.  She is s/p week #12 Herceptin and Taxol (05/20/2018 - 08/05/2017).  Echo at [facility] on 03/12/2018 revealed an EF of 61%. Echo at [facility] on 08/18/2018 that revealed an LVEF of 50 to 55%.  There was a trivial pericardial effusion present on exam.  He has a history of stage IIB ovarian cancer s/p exploratory laparotomy, TAH/BSO, omentectomy, peritoneal biopsies, selective pelvic and periaortic lymph node sampling on 06/20/2001.  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  Uterine serosa and the surface of the contralateral ovary were positive for metastatic disease. The primary tumor was approximately 15 cm in diameter.  She received 6 cycles of carboplatin and Taxol.  Bone density on 04/01/2017 revealed osteoporosis with a T-score of -2.5 in the left femoral neck.  She has grade I neuropathy in her feet.  She has chronic right lower extremity edema.  Right lower extremity duplex on 07/22/2018 revealed no femoropopliteal  and no calf DVT in the visualized calf veins.  Symptomatically, patient presents with a productive cough x2 to 3 weeks.  She also has an area of tender adenopathy to her RIGHT neck.  She denies any fevers.  No B symptoms.  Patient eating well; weight up 2 pounds.  Neuropathy in her feet is stable.  Exam reveals the aforementioned neck adenopathy.  WBC 11,800 ([place] 7600).  Hemoglobin 10.2, hematocrit 32.5, and platelets 299,000.  Calcium 8.6 mg/dL (corrected 9.2 mg/dL).   Plan: 1. Labs today:  CBC with diff, CMP, Mg. 2. Stage I HER-2/neu (+) breast cancer  Doing well clinically with regards to her breast cancer.  No breast concerns.  Has completed weekly Herceptin and paclitaxel x12 cycles.  Review echocardiogram from 08/18/2018   LVEF of 50 to 55%.    There  was a trivial pericardial effusion present on exam.  Reviewed plans for continued Herceptin treatment every 21 days.  Labs reviewed. Blood counts stable and adequate enough for treatment, however due to acute illness will POSTPONE cycle #13 Herceptin. 3. Cervical adenitis and cough  Area of tender swelling deep cervical lymph nodes in RIGHT neck; adjacent to SCM.  Tenderness/pain exacerbates with lateral rotation, palpation, and coughing..  No fevers.  Etiology felt to be secondary to acute bacterial infection resulting in the presenting cough and adenitis.  Allergies reviewed. CrCl ~ 93m/min.   Rx: Augmentin 875/125 mg BID x 10 days.   Patient to call clinic, or follow up with PCP, if not improving.   Urgent return precautions discussed. Aware that any SOB, dysphagia, or sensation of neck swelling will necessitate evaluation in the ED.  4. Chemotherapy-induced neuropathy  Stable. Grade I in feet only.   She notes that her neuropathy does not impose any functional limitations, nor does it impede her ability to ambulate safely.  Completed 12 cycles of taxane (paclitaxel) therapy.   Continue to monitor for progression.  5. Osteoporosis  Discuss the use of Prolia to prevent bone thinning/loss associated with endocrine therapy.   Side effects of this medication reviewed.   Patient previously provided with written information.   Patient encouraged to take calcium 1200 mg and vitamin D 800 IU daily.   Discussed the need for dental clearance prior to beginning Prolia, as this medication is associated with an increased risk of dental complications such as osteonecrosis of the jaw.  6. RTC on 09/05/2018 for MD assessment, labs (CBC with diff, CMP, Mg), and +/- cycle #13 Herceptin.   BHonor Loh NP  08/26/2018, 1:01 PM

## 2018-08-26 NOTE — Progress Notes (Signed)
Pt here for follow up.   Reports swelling and pain to right side of neck. Pain is present with swallowing and touch. Productive cough (clear - white in color)  x 2-3 weeks. Denies any other symptoms at this time.

## 2018-08-26 NOTE — Progress Notes (Signed)
No treatment today.

## 2018-09-01 ENCOUNTER — Other Ambulatory Visit: Payer: Self-pay

## 2018-09-02 ENCOUNTER — Ambulatory Visit
Admission: RE | Admit: 2018-09-02 | Discharge: 2018-09-02 | Disposition: A | Discharge status: Home / Self Care | Payer: Medicare HMO | Source: Ambulatory Visit | Attending: Radiation Oncology | Admitting: Radiation Oncology

## 2018-09-02 ENCOUNTER — Other Ambulatory Visit: Payer: Self-pay

## 2018-09-02 ENCOUNTER — Encounter: Payer: Self-pay | Admitting: Radiation Oncology

## 2018-09-02 ENCOUNTER — Ambulatory Visit: Payer: Medicare HMO

## 2018-09-02 VITALS — BP 152/75 | HR 76 | Temp 97.8°F | Resp 16 | Wt 167.1 lb

## 2018-09-02 DIAGNOSIS — R5383 Other fatigue: Secondary | ICD-10-CM | POA: Insufficient documentation

## 2018-09-02 DIAGNOSIS — Z79811 Long term (current) use of aromatase inhibitors: Secondary | ICD-10-CM | POA: Diagnosis not present

## 2018-09-02 DIAGNOSIS — Z923 Personal history of irradiation: Secondary | ICD-10-CM | POA: Insufficient documentation

## 2018-09-02 DIAGNOSIS — Z9221 Personal history of antineoplastic chemotherapy: Secondary | ICD-10-CM | POA: Insufficient documentation

## 2018-09-02 DIAGNOSIS — Z170 Estrogen receptor positive status [ER+]: Secondary | ICD-10-CM | POA: Insufficient documentation

## 2018-09-02 DIAGNOSIS — C50411 Malignant neoplasm of upper-outer quadrant of right female breast: Secondary | ICD-10-CM | POA: Insufficient documentation

## 2018-09-02 NOTE — Progress Notes (Signed)
Radiation Oncology Follow up Note  Name: [name] [name]   Date:   09/02/2018 MRN:  [id number] DOB: 1933-08-14    This 85 y.o. female presents to the clinic today for initiation of radiation therapy to her right breast for stage TI triple positive invasive mammary carcinoma of the upper outer quadrant status post chemotherapy.  REFERRING PROVIDER: Einar Pheasant, MD  HPI: patient is an 85 year old female now seen in reevaluation after completing chemotherapy for triple positive for ER/PR and HER-2/neu overexpressed invasive mammary carcinoma of the right upper quadrant outer of the right breast status post wide local excision..he is completed Taxol and is currently on maintenance Herceptin. She is doing well somewhat fatigued. She specifically denies breast tenderness cough or bone pain. COMPLICATIONS OF TREATMENT: none  FOLLOW UP COMPLIANCE: keeps appointments   PHYSICAL EXAM:  BP (!) 152/75 (BP Location: Left Arm, Patient Position: Sitting)   Pulse 76   Temp 97.8 F (36.6 C) (Tympanic)   Resp 16   Wt 167 lb 1.7 oz (75.8 kg)   BMI 29.60 kg/m  Lungs are clear to A&P cardiac examination essentially unremarkable with regular rate and rhythm. No dominant mass or nodularity is noted in either breast in 2 positions examined. Incision is well-healed. No axillary or supraclavicular adenopathy is appreciated. Cosmetic result is excellent.Well-developed well-nourished patient in NAD. HEENT reveals PERLA, EOMI, discs not visualized.  Oral cavity is clear. No oral mucosal lesions are identified. Neck is clear without evidence of cervical or supraclavicular adenopathy. Lungs are clear to A&P. Cardiac examination is essentially unremarkable with regular rate and rhythm without murmur rub or thrill. Abdomen is benign with no organomegaly or masses noted. Motor sensory and DTR levels are equal and symmetric in the upper and lower extremities. Cranial nerves II through XII are grossly intact.  Proprioception is intact. No peripheral adenopathy or edema is identified. No motor or sensory levels are noted. Crude visual fields are within normal range.  RADIOLOGY RESULTS: no current films for review  PLAN: at the present time patient is doing well status post Taxol chemotherapy currently on maintenance Herceptin for her ER/PR positive HER-2/neuoverexpressed invasive mammary carcinoma.I would like to go with whole breast radiation to her right breast as originally planned. Would plan on delivering 5040 cGy in 28 fractions. She is rather large pendulous breasts making hypofractionated course of treatment difficult. Would also boost her scar another  1400 cGy using electron beam. Risks and benefits of treatment including skin reaction fatigue possible inclusion of superficial lung all were discussed in detail with the patient. I personally set up and ordered CT simulation. She will be's receiving Herceptin throughout her course of treatment.There will be extra effort by both professional staff as well as technical staff to coordinate and manage concurrent chemoradiation and ensuing side effects during her treatments.patient, plans my treatment plan well.  I would like to take this opportunity to thank you for allowing me to participate in the care of your patient.Noreene Filbert, MD

## 2018-09-03 ENCOUNTER — Other Ambulatory Visit: Payer: Self-pay

## 2018-09-04 ENCOUNTER — Ambulatory Visit
Admission: RE | Admit: 2018-09-04 | Discharge: 2018-09-04 | Disposition: A | Discharge status: Home / Self Care | Payer: Medicare HMO | Source: Ambulatory Visit | Attending: Radiation Oncology | Admitting: Radiation Oncology

## 2018-09-04 ENCOUNTER — Other Ambulatory Visit: Payer: Self-pay

## 2018-09-04 DIAGNOSIS — C50411 Malignant neoplasm of upper-outer quadrant of right female breast: Secondary | ICD-10-CM | POA: Diagnosis not present

## 2018-09-04 DIAGNOSIS — Z90721 Acquired absence of ovaries, unilateral: Secondary | ICD-10-CM | POA: Insufficient documentation

## 2018-09-04 DIAGNOSIS — C50919 Malignant neoplasm of unspecified site of unspecified female breast: Secondary | ICD-10-CM | POA: Diagnosis not present

## 2018-09-04 DIAGNOSIS — Z79899 Other long term (current) drug therapy: Secondary | ICD-10-CM | POA: Insufficient documentation

## 2018-09-04 DIAGNOSIS — I10 Essential (primary) hypertension: Secondary | ICD-10-CM | POA: Insufficient documentation

## 2018-09-04 DIAGNOSIS — G620 Drug-induced polyneuropathy: Secondary | ICD-10-CM | POA: Diagnosis not present

## 2018-09-04 DIAGNOSIS — Z8042 Family history of malignant neoplasm of prostate: Secondary | ICD-10-CM | POA: Insufficient documentation

## 2018-09-04 DIAGNOSIS — R05 Cough: Secondary | ICD-10-CM | POA: Insufficient documentation

## 2018-09-04 DIAGNOSIS — Z8059 Family history of malignant neoplasm of other urinary tract organ: Secondary | ICD-10-CM | POA: Diagnosis not present

## 2018-09-04 DIAGNOSIS — I889 Nonspecific lymphadenitis, unspecified: Secondary | ICD-10-CM | POA: Insufficient documentation

## 2018-09-04 DIAGNOSIS — M810 Age-related osteoporosis without current pathological fracture: Secondary | ICD-10-CM | POA: Diagnosis not present

## 2018-09-04 DIAGNOSIS — Z803 Family history of malignant neoplasm of breast: Secondary | ICD-10-CM | POA: Diagnosis not present

## 2018-09-04 DIAGNOSIS — Z8041 Family history of malignant neoplasm of ovary: Secondary | ICD-10-CM | POA: Insufficient documentation

## 2018-09-04 DIAGNOSIS — Z8052 Family history of malignant neoplasm of bladder: Secondary | ICD-10-CM | POA: Insufficient documentation

## 2018-09-04 DIAGNOSIS — Z8249 Family history of ischemic heart disease and other diseases of the circulatory system: Secondary | ICD-10-CM | POA: Diagnosis not present

## 2018-09-04 DIAGNOSIS — Z833 Family history of diabetes mellitus: Secondary | ICD-10-CM | POA: Insufficient documentation

## 2018-09-04 DIAGNOSIS — Z170 Estrogen receptor positive status [ER+]: Secondary | ICD-10-CM | POA: Diagnosis not present

## 2018-09-04 DIAGNOSIS — Z8543 Personal history of malignant neoplasm of ovary: Secondary | ICD-10-CM | POA: Insufficient documentation

## 2018-09-05 ENCOUNTER — Inpatient Hospital Stay: Payer: Medicare HMO

## 2018-09-05 ENCOUNTER — Inpatient Hospital Stay (HOSPITAL_BASED_OUTPATIENT_CLINIC_OR_DEPARTMENT_OTHER): Payer: Medicare HMO | Admitting: Hematology and Oncology

## 2018-09-05 ENCOUNTER — Encounter: Payer: Self-pay | Admitting: Hematology and Oncology

## 2018-09-05 ENCOUNTER — Other Ambulatory Visit: Payer: Self-pay

## 2018-09-05 VITALS — BP 156/61 | HR 68 | Temp 97.9°F | Resp 18 | Ht 63.0 in | Wt 166.2 lb

## 2018-09-05 VITALS — Temp 97.5°F

## 2018-09-05 DIAGNOSIS — Z170 Estrogen receptor positive status [ER+]: Secondary | ICD-10-CM

## 2018-09-05 DIAGNOSIS — Z833 Family history of diabetes mellitus: Secondary | ICD-10-CM | POA: Diagnosis not present

## 2018-09-05 DIAGNOSIS — C50411 Malignant neoplasm of upper-outer quadrant of right female breast: Secondary | ICD-10-CM | POA: Diagnosis not present

## 2018-09-05 DIAGNOSIS — C50919 Malignant neoplasm of unspecified site of unspecified female breast: Secondary | ICD-10-CM | POA: Diagnosis not present

## 2018-09-05 DIAGNOSIS — I889 Nonspecific lymphadenitis, unspecified: Secondary | ICD-10-CM | POA: Diagnosis not present

## 2018-09-05 DIAGNOSIS — Z8249 Family history of ischemic heart disease and other diseases of the circulatory system: Secondary | ICD-10-CM | POA: Diagnosis not present

## 2018-09-05 DIAGNOSIS — T451X5A Adverse effect of antineoplastic and immunosuppressive drugs, initial encounter: Secondary | ICD-10-CM

## 2018-09-05 DIAGNOSIS — R05 Cough: Secondary | ICD-10-CM

## 2018-09-05 DIAGNOSIS — Z8543 Personal history of malignant neoplasm of ovary: Secondary | ICD-10-CM

## 2018-09-05 DIAGNOSIS — Z5112 Encounter for antineoplastic immunotherapy: Secondary | ICD-10-CM

## 2018-09-05 DIAGNOSIS — I10 Essential (primary) hypertension: Secondary | ICD-10-CM | POA: Diagnosis not present

## 2018-09-05 DIAGNOSIS — Z5111 Encounter for antineoplastic chemotherapy: Secondary | ICD-10-CM

## 2018-09-05 DIAGNOSIS — M810 Age-related osteoporosis without current pathological fracture: Secondary | ICD-10-CM | POA: Diagnosis not present

## 2018-09-05 DIAGNOSIS — Z90721 Acquired absence of ovaries, unilateral: Secondary | ICD-10-CM | POA: Diagnosis not present

## 2018-09-05 DIAGNOSIS — G620 Drug-induced polyneuropathy: Secondary | ICD-10-CM | POA: Diagnosis not present

## 2018-09-05 LAB — CBC WITH DIFFERENTIAL/PLATELET
Abs Immature Granulocytes: 0.05 10*3/uL (ref 0.00–0.07)
Basophils Absolute: 0.1 10*3/uL (ref 0.0–0.1)
Basophils Relative: 1 %
Eosinophils Absolute: 0.1 10*3/uL (ref 0.0–0.5)
Eosinophils Relative: 1 %
HCT: 32 % — ABNORMAL LOW (ref 36.0–46.0)
Hemoglobin: 10.1 g/dL — ABNORMAL LOW (ref 12.0–15.0)
Immature Granulocytes: 1 %
Lymphocytes Relative: 24 %
Lymphs Abs: 2.5 10*3/uL (ref 0.7–4.0)
MCH: 30.1 pg (ref 26.0–34.0)
MCHC: 31.6 g/dL (ref 30.0–36.0)
MCV: 95.2 fL (ref 80.0–100.0)
Monocytes Absolute: 1 10*3/uL (ref 0.1–1.0)
Monocytes Relative: 10 %
Neutro Abs: 6.7 10*3/uL (ref 1.7–7.7)
Neutrophils Relative %: 63 %
Platelets: 355 10*3/uL (ref 150–400)
RBC: 3.36 MIL/uL — ABNORMAL LOW (ref 3.87–5.11)
RDW: 14.3 % (ref 11.5–15.5)
WBC: 10.5 10*3/uL (ref 4.0–10.5)
nRBC: 0 % (ref 0.0–0.2)

## 2018-09-05 LAB — COMPREHENSIVE METABOLIC PANEL
ALT: 16 U/L (ref 0–44)
AST: 16 U/L (ref 15–41)
Albumin: 3.3 g/dL — ABNORMAL LOW (ref 3.5–5.0)
Alkaline Phosphatase: 79 U/L (ref 38–126)
Anion gap: 7 (ref 5–15)
BUN: 17 mg/dL (ref 8–23)
CO2: 26 mmol/L (ref 22–32)
Calcium: 8.3 mg/dL — ABNORMAL LOW (ref 8.9–10.3)
Chloride: 104 mmol/L (ref 98–111)
Creatinine, Ser: 0.58 mg/dL (ref 0.44–1.00)
GFR calc Af Amer: >60 mL/min (ref >60)
GFR calc non Af Amer: >60 mL/min (ref >60)
Glucose, Bld: 88 mg/dL (ref 70–99)
Potassium: 3.7 mmol/L (ref 3.5–5.1)
Sodium: 137 mmol/L (ref 135–145)
Total Bilirubin: 0.2 mg/dL — ABNORMAL LOW (ref 0.3–1.2)
Total Protein: 6.3 g/dL — ABNORMAL LOW (ref 6.5–8.1)

## 2018-09-05 LAB — MAGNESIUM: Magnesium: 2.2 mg/dL (ref 1.7–2.4)

## 2018-09-05 MED ORDER — SODIUM CHLORIDE 0.9 % IV SOLN
Freq: Once | INTRAVENOUS | Status: AC
  Administered 2018-09-05: 11:00:00 via INTRAVENOUS
  Filled 2018-09-05: qty 250

## 2018-09-05 MED ORDER — HEPARIN SOD (PORK) LOCK FLUSH 100 UNIT/ML IV SOLN
500.0000 [IU] | Freq: Once | INTRAVENOUS | Status: AC
  Administered 2018-09-05: 500 [IU] via INTRAVENOUS
  Filled 2018-09-05: qty 5

## 2018-09-05 MED ORDER — TRASTUZUMAB CHEMO 150 MG IV SOLR
300.0000 mg | Freq: Once | INTRAVENOUS | Status: AC
  Administered 2018-09-05: 300 mg via INTRAVENOUS
  Filled 2018-09-05: qty 14.29

## 2018-09-05 MED ORDER — SODIUM CHLORIDE 0.9% FLUSH
10.0000 mL | INTRAVENOUS | Status: DC | PRN
  Administered 2018-09-05: 10 mL via INTRAVENOUS
  Filled 2018-09-05: qty 10

## 2018-09-05 MED ORDER — ACETAMINOPHEN 325 MG PO TABS
650.0000 mg | ORAL_TABLET | Freq: Once | ORAL | Status: AC
  Administered 2018-09-05: 650 mg via ORAL
  Filled 2018-09-05: qty 2

## 2018-09-05 MED ORDER — DIPHENHYDRAMINE HCL 25 MG PO CAPS
50.0000 mg | ORAL_CAPSULE | Freq: Once | ORAL | Status: AC
  Administered 2018-09-05: 50 mg via ORAL
  Filled 2018-09-05: qty 2

## 2018-09-05 NOTE — Progress Notes (Signed)
[facility] [address] [place], [place] [address] Phone: [phone]  Fax: [phone]    Clinic day:  09/05/2018  Chief Complaint: [name] [name] is a 85 y.o. female with stage I Her2/neu + breast cancer who is seen for assessment prior to continuation of Herceptin.  HPI:  The patient was last seen in the medical oncology clinic on 08/26/2018 by Honor Loh, NP.  At that time, she had a productive cough x 2 to 3 weeks.  She had an area of tender adenopathy to her RIGHT neck.  She denied any fevers.  No B symptoms.  Patient was eating well; weight was up 2 pounds.  Neuropathy in her feet was stable.  WBC was 11,800 ([place] 7600).  Hemoglobin 10.2, hematocrit 32.5, and platelets 299,000.  Calcium was 8.6 (corrected 9.2 mg/dL).  Echo on 08/18/2018 revealed an EF of 50-55%.  She was diagnosed with cervical adenitis and was prescribed Augmentin.  During the interim, she notes that the swelling in her neck has gone down.  She still has a problem with ringing in her ear today.  She feels like she has an infection in her ear.  She denies any fever.  She denies any pain, shortness of breath or cough.  She denies any breast concerns.  She is scheduled for radiation simulation on 09/11/2018.  She is scheduled to start radiation on 09/15/2018.   Past Medical History:  Diagnosis Date  . Diverticulitis of sigmoid colon   . Endometriosis   . Fibrocystic breast disease   . Glaucoma   . H/O urinary incontinence   . History of chicken pox   . History of colon polyps 2014  . Hyperlipidemia   . Hypertension   . Ovarian cancer ([place]) 2002   Chemo tx's @ [facility] with Total Hysterectomy. Dr Amalia Hailey  . Personal history of chemotherapy    ovarian cancer  . Port-A-Cath in place   . Sinusitis   . Tuberculosis    positve skin test    Past Surgical History:  Procedure Laterality Date  . ABDOMINAL HYSTERECTOMY  2003   complete Dr Amalia Hailey  . ABSCESS DRAINAGE  2017   right buttock at [facility]  . APPENDECTOMY  2003  . BREAST EXCISIONAL BIOPSY Left 1993   excisional bx. negative results  . BREAST SURGERY Left 1982   papilloma removal  . COLONOSCOPY  2006, 2014   Dr Vira Agar   . COLONOSCOPY N/A 03/27/2016   Procedure: COLONOSCOPY;  Surgeon: Robert Bellow, MD;  Location: [facility] ENDOSCOPY;  Service: Endoscopy;  Laterality: N/A;  . COLOSTOMY REVERSAL  2014   at [facility]  . NASAL SINUS SURGERY     For fungal infection  . NASAL SINUS SURGERY  8/05 8/06  . OOPHORECTOMY    . OSTOMY  05/22/2012   diverticulitis with abcess/ Dr Tamala Julian  . ovarian cancer     Exploratory lap  . PORTA CATH INSERTION N/A 05/26/2018   Procedure: PORTA CATH INSERTION;  Surgeon: Algernon Huxley, MD;  Location: [place] [facility];  Service: Cardiovascular;  Laterality: N/A;  . TONSILLECTOMY AND ADENOIDECTOMY  1947    Family History  Problem Relation Age of Onset  . Hypertension Mother   . Coronary artery disease Mother   . Heart disease Mother   . Kidney disease Mother   . Diabetes Mother   . Cancer Mother        uterine cancer  . Diabetes Father   .  Kidney failure Father   . Diabetes Brother   . Coronary artery disease Brother   . Cancer Brother        bladder cancer  . Stroke Sister   . Heart disease Sister        [facility]  . Cancer Maternal Aunt        ovarian cancer  . Breast cancer Cousin   . Cancer Brother        Prostate  . Breast cancer Cousin   . Breast cancer Cousin     Social History:  reports that she has never smoked. She has never used smokeless tobacco. She reports that she does not drink alcohol or use drugs.  Patient is a retired Engineer, site. Generally accompanied to appointments by friend, [name]. She lives in [place].  The patient is alone today.   Allergies: No Known Allergies  Current Medications: Current Outpatient Medications  Medication Sig Dispense Refill  . amLODipine (NORVASC) 2.5 MG tablet Take 1 tablet  (2.5 mg total) by mouth daily. 30 tablet 2  . Calcium Carbonate-Vitamin D 600-400 MG-UNIT tablet Take 1 tablet by mouth daily.     . Multiple Vitamins-Minerals (MULTIVITAMIN WITH MINERALS) tablet Take 1 tablet by mouth daily.    Marland Kitchen amoxicillin-clavulanate (AUGMENTIN) 875-125 MG tablet Take 1 tablet by mouth 2 (two) times daily for 10 days. (Patient not taking: Reported on 09/05/2018) 20 tablet 0  . lidocaine-prilocaine (EMLA) cream Apply 1 application topically as needed. (Patient not taking: Reported on 09/05/2018) 30 g 5  . prochlorperazine (COMPAZINE) 10 MG tablet Take 1 tablet (10 mg total) by mouth every 6 (six) hours as needed for nausea or vomiting. (Patient not taking: Reported on 09/05/2018) 30 tablet 3   Current Facility-Administered Medications  Medication Dose Route Frequency Provider Last Rate Last Dose  . lidocaine-prilocaine (EMLA) cream   Topical PRN Algernon Huxley, MD       Facility-Administered Medications Ordered in Other Visits  Medication Dose Route Frequency Provider Last Rate Last Dose  . heparin lock flush 100 unit/mL  500 Units Intravenous Once Corcoran, Melissa C, MD      . sodium chloride 0.9 % 50 mL with famotidine (PEPCID) 20 mg infusion  20 mg Intravenous Once Charlaine Dalton R, MD      . sodium chloride flush (NS) 0.9 % injection 10 mL  10 mL Intravenous PRN Cammie Sickle, MD   10 mL at 06/24/18 0854  . sodium chloride flush (NS) 0.9 % injection 10 mL  10 mL Intracatheter PRN Cammie Sickle, MD   10 mL at 07/01/18 0830  . sodium chloride flush (NS) 0.9 % injection 10 mL  10 mL Intravenous PRN Nolon Stalls C, MD   10 mL at 09/05/18 0930    Review of Systems  Constitutional: Positive for weight loss (1 pound). Negative for chills, diaphoresis, fever and malaise/fatigue.  HENT: Positive for tinnitus. Negative for congestion, ear discharge, nosebleeds, sinus pain and sore throat.        Feels like has an infection in her ear.  Eyes: Negative for  blurred vision, double vision, photophobia and pain.  Respiratory: Negative for cough, hemoptysis, sputum production, shortness of breath and wheezing.   Cardiovascular: Negative.  Negative for chest pain, palpitations, orthopnea, leg swelling (chronic) and PND.  Gastrointestinal: Negative.  Negative for abdominal pain, blood in stool, constipation, diarrhea, melena, nausea and vomiting.  Genitourinary: Negative.  Negative for dysuria, frequency, hematuria and urgency.  Musculoskeletal:  Negative.  Negative for back pain, falls, joint pain, myalgias and neck pain.  Skin: Negative for itching and rash.       Swelling to RIGHT neck, improved.  Neurological: Positive for sensory change (stable neuropathy in BILATERAL feet). Negative for dizziness, tremors, speech change, focal weakness, weakness and headaches.  Endo/Heme/Allergies: Negative.  Does not bruise/bleed easily.  Psychiatric/Behavioral: Negative.  Negative for depression and memory loss. The patient is not nervous/anxious and does not have insomnia.   All other systems reviewed and are negative.  Performance status (ECOG): 1  Vital Signs BP (!) 156/61 (BP Location: Left Arm, Patient Position: Sitting)   Pulse 68   Temp 97.9 F (36.6 C) (Tympanic)   Resp 18   Ht _0  (1.6 m)   Wt 166 lb 3.6 oz (75.4 kg)   SpO2 100%   BMI 29.45 kg/m   Physical Exam  Constitutional: She is oriented to person, place, and time and well-developed, well-nourished, and in no distress. No distress.  HENT:  Head: Normocephalic and atraumatic.  Right Ear: Tympanic membrane, external ear and ear canal normal.  Left Ear: Tympanic membrane, external ear and ear canal normal.  Nose: Nose normal.  Mouth/Throat: Oropharynx is clear and moist and mucous membranes are normal. No oropharyngeal exudate.  Wearing a blue crochet cap.  Eyes: Pupils are equal, round, and reactive to light. Conjunctivae and EOM are normal. Right eye exhibits no discharge. Left eye  exhibits no discharge. No scleral icterus.  Glasses.  Brown eyes.  Neck: Normal range of motion. Neck supple. No JVD present.  Cardiovascular: Normal rate, regular rhythm, normal heart sounds and intact distal pulses. Exam reveals no gallop and no friction rub.  No murmur heard. Pulmonary/Chest: Effort normal. No respiratory distress. She has no wheezes. She has no rhonchi. She has no rales.  Abdominal: Soft. Bowel sounds are normal. She exhibits no distension and no mass. There is no abdominal tenderness. There is no rebound and no guarding.  Musculoskeletal: Normal range of motion.        General: No tenderness or edema.  Lymphadenopathy:       Head (right side): No submental, no submandibular, no preauricular, no posterior auricular and no occipital adenopathy present.       Head (left side): No submental, no submandibular, no preauricular, no posterior auricular and no occipital adenopathy present.    She has cervical adenopathy.       Right cervical: Deep cervical (fingertip size node) adenopathy present. No superficial cervical and no posterior cervical adenopathy present.      Left cervical: No superficial cervical, no deep cervical and no posterior cervical adenopathy present.    She has no axillary adenopathy.       Right: No inguinal and no supraclavicular adenopathy present.       Left: No inguinal and no supraclavicular adenopathy present.  Neurological: She is alert and oriented to person, place, and time. A sensory deficit (stable grade I neuropathy in feet) is present. Gait normal.  Skin: Skin is warm and dry. No rash noted. She is not diaphoretic. No erythema. No pallor.  Psychiatric: Mood, affect and judgment normal.  Nursing note and vitals reviewed.   Infusion on 09/05/2018  Component Date Value Ref Range Status  . Magnesium 09/05/2018 2.2  1.7 - 2.4 mg/dL Final   Performed at [facility], [address] [place], [place] [address]  . Sodium 09/05/2018 137   135 - 145 mmol/L Final  .  Potassium 09/05/2018 3.7  3.5 - 5.1 mmol/L Final  . Chloride 09/05/2018 104  98 - 111 mmol/L Final  . CO2 09/05/2018 26  22 - 32 mmol/L Final  . Glucose, Bld 09/05/2018 88  70 - 99 mg/dL Final  . BUN 09/05/2018 17  8 - 23 mg/dL Final  . Creatinine, Ser 09/05/2018 0.58  0.44 - 1.00 mg/dL Final  . Calcium 09/05/2018 8.3* 8.9 - 10.3 mg/dL Final  . Total Protein 09/05/2018 6.3* 6.5 - 8.1 g/dL Final  . Albumin 09/05/2018 3.3* 3.5 - 5.0 g/dL Final  . AST 09/05/2018 16  15 - 41 U/L Final  . ALT 09/05/2018 16  0 - 44 U/L Final  . Alkaline Phosphatase 09/05/2018 79  38 - 126 U/L Final  . Total Bilirubin 09/05/2018 0.2* 0.3 - 1.2 mg/dL Final  . GFR calc non Af Amer 09/05/2018 >60  >60 mL/min Final  . GFR calc Af Amer 09/05/2018 >60  >60 mL/min Final  . Anion gap 09/05/2018 7  5 - 15 Final   Performed at [facility], [address] [place], [place] [address]  . WBC 09/05/2018 10.5  4.0 - 10.5 K/uL Final  . RBC 09/05/2018 3.36* 3.87 - 5.11 MIL/uL Final  . Hemoglobin 09/05/2018 10.1* 12.0 - 15.0 g/dL Final  . HCT 09/05/2018 32.0* 36.0 - 46.0 % Final  . MCV 09/05/2018 95.2  80.0 - 100.0 fL Final  . MCH 09/05/2018 30.1  26.0 - 34.0 pg Final  . MCHC 09/05/2018 31.6  30.0 - 36.0 g/dL Final  . RDW 09/05/2018 14.3  11.5 - 15.5 % Final  . Platelets 09/05/2018 355  150 - 400 K/uL Final  . nRBC 09/05/2018 0.0  0.0 - 0.2 % Final  . Neutrophils Relative % 09/05/2018 63  % Final  . Neutro Abs 09/05/2018 6.7  1.7 - 7.7 K/uL Final  . Lymphocytes Relative 09/05/2018 24  % Final  . Lymphs Abs 09/05/2018 2.5  0.7 - 4.0 K/uL Final  . Monocytes Relative 09/05/2018 10  % Final  . Monocytes Absolute 09/05/2018 1.0  0.1 - 1.0 K/uL Final  . Eosinophils Relative 09/05/2018 1  % Final  . Eosinophils Absolute 09/05/2018 0.1  0.0 - 0.5 K/uL Final  . Basophils Relative 09/05/2018 1  % Final  . Basophils Absolute 09/05/2018 0.1  0.0 - 0.1 K/uL Final  . Immature Granulocytes  09/05/2018 1  % Final  . Abs Immature Granulocytes 09/05/2018 0.05  0.00 - 0.07 K/uL Final   Performed at [facility], [address], [place], [place] [address]    Assessment:  JESSIA KIEF is a 85 y.o. female with stage I Her2/neu + breast cancer s/p lumpectomy and sentinel lymph node biopsy on 03/18/2018.  Pathology revealed a 0.6 cm grade III invasive mammary carcinoma.  There was DCIS.  There was no lymphovascular invasion.  Tumor was ER + (99%), PR + (25%), and Her2/neu + by FISH.  Pathologic stage was T1bN0.  Screening mammogram on 01/17/2018 revealed calcifications and possible mass in the right breast.  Diagnostic mammogram and ultrasound on 01/22/2018 revealed indeterminate group of microcalcifications over the right upper outer quadrant spanning 4 x 9 x 11 mm.  There was a 4 x 7 x 7 mm probable complicated cyst at the 11 o'clock position of the right breast 5 cm from the nipple.  She is s/p 12 weeks of Herceptin and Taxol (05/20/2018 - 08/05/2017).    Echo at [facility] on 03/12/2018  revealed an EF of 61%. Echo at [facility] on 08/18/2018 that revealed an LVEF of 50 to 55%.  There was a trivial pericardial effusion was present.  He has a history of stage IIB ovarian cancer s/p exploratory laparotomy, TAH/BSO, omentectomy, peritoneal biopsies, selective pelvic and periaortic lymph node sampling on 06/20/2001.  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  Uterine serosa and the surface of the contralateral ovary were positive for metastatic disease. The primary tumor was approximately 15 cm in diameter.  She received 6 cycles of carboplatin and Taxol.  Bone density on 04/01/2017 revealed osteoporosis with a T-score of -2.5 in the left femoral neck.  She has grade I neuropathy in her feet.  She has chronic right lower extremity edema.  Right lower extremity duplex on 07/22/2018 revealed no femoropopliteal and no calf DVT in the visualized calf veins.   Symptomatically, she is doing well.  Right sided adenitis has resolved.  Exam reveals a fingertip size right cervical lymph node.  Plan: 1. Labs today:  CBC with diff, CMP, Mg. 2. Stage I HER-2/neu (+) breast cancer Clinically doing well. Patient is s/p weekly Herceptin and paclitaxel x12. Echo on 08/18/2018 revealed an EF of 50 to 55%.   Discuss plan for Herceptin weekly or every 3 weeks to complete a year of adjuvant Her2/neu therapy.  Decision made for weekly Herceptin.  She is scheduled to begin radiation on 09/15/2018. 3. Cervical adenitis Patient s/p Augmentin x 10 days. Clinically adenitis has resolved. Fingertip non-tender residual cervical lymph node. 4. Chemotherapy-induced neuropathy Grade I neuropathy in feet only.  5. Osteoporosis Discuss calcium and vitamin D. Discuss Prolia to prevent bone thinning/loss associated with endocrine therapy.  Side effects previously reviewed. Await dental clearance. 6.   Herceptin today. 7.   RTC weekly x 5 for Herceptin.  8.   RTC in 6 weeks for MD assessment, labs (CBC with diff, CMP, CA27.29), and Herceptin   Lequita Asal, MD  09/05/2018, 10:32 AM

## 2018-09-05 NOTE — Progress Notes (Signed)
No new changes noted today 

## 2018-09-10 ENCOUNTER — Other Ambulatory Visit: Payer: Self-pay

## 2018-09-11 ENCOUNTER — Other Ambulatory Visit: Payer: Self-pay

## 2018-09-11 ENCOUNTER — Ambulatory Visit
Admission: RE | Admit: 2018-09-11 | Discharge: 2018-09-11 | Disposition: A | Discharge status: Home / Self Care | Payer: Medicare HMO | Source: Ambulatory Visit | Attending: Radiation Oncology | Admitting: Radiation Oncology

## 2018-09-11 DIAGNOSIS — Z8059 Family history of malignant neoplasm of other urinary tract organ: Secondary | ICD-10-CM | POA: Diagnosis not present

## 2018-09-11 DIAGNOSIS — C50919 Malignant neoplasm of unspecified site of unspecified female breast: Secondary | ICD-10-CM | POA: Diagnosis not present

## 2018-09-11 DIAGNOSIS — I889 Nonspecific lymphadenitis, unspecified: Secondary | ICD-10-CM | POA: Diagnosis not present

## 2018-09-11 DIAGNOSIS — Z79899 Other long term (current) drug therapy: Secondary | ICD-10-CM | POA: Insufficient documentation

## 2018-09-11 DIAGNOSIS — Z8052 Family history of malignant neoplasm of bladder: Secondary | ICD-10-CM | POA: Diagnosis not present

## 2018-09-11 DIAGNOSIS — G620 Drug-induced polyneuropathy: Secondary | ICD-10-CM | POA: Diagnosis not present

## 2018-09-11 DIAGNOSIS — I10 Essential (primary) hypertension: Secondary | ICD-10-CM | POA: Diagnosis not present

## 2018-09-11 DIAGNOSIS — R05 Cough: Secondary | ICD-10-CM | POA: Insufficient documentation

## 2018-09-11 DIAGNOSIS — Z170 Estrogen receptor positive status [ER+]: Secondary | ICD-10-CM | POA: Diagnosis not present

## 2018-09-11 DIAGNOSIS — Z90721 Acquired absence of ovaries, unilateral: Secondary | ICD-10-CM | POA: Diagnosis not present

## 2018-09-11 DIAGNOSIS — Z8042 Family history of malignant neoplasm of prostate: Secondary | ICD-10-CM | POA: Insufficient documentation

## 2018-09-11 DIAGNOSIS — M810 Age-related osteoporosis without current pathological fracture: Secondary | ICD-10-CM | POA: Insufficient documentation

## 2018-09-11 DIAGNOSIS — C50411 Malignant neoplasm of upper-outer quadrant of right female breast: Secondary | ICD-10-CM | POA: Diagnosis not present

## 2018-09-11 DIAGNOSIS — Z8249 Family history of ischemic heart disease and other diseases of the circulatory system: Secondary | ICD-10-CM | POA: Insufficient documentation

## 2018-09-11 DIAGNOSIS — Z833 Family history of diabetes mellitus: Secondary | ICD-10-CM | POA: Diagnosis not present

## 2018-09-11 DIAGNOSIS — Z8543 Personal history of malignant neoplasm of ovary: Secondary | ICD-10-CM | POA: Insufficient documentation

## 2018-09-11 DIAGNOSIS — Z803 Family history of malignant neoplasm of breast: Secondary | ICD-10-CM | POA: Insufficient documentation

## 2018-09-11 DIAGNOSIS — Z8041 Family history of malignant neoplasm of ovary: Secondary | ICD-10-CM | POA: Diagnosis not present

## 2018-09-15 ENCOUNTER — Ambulatory Visit
Admission: RE | Admit: 2018-09-15 | Discharge: 2018-09-15 | Disposition: A | Discharge status: Home / Self Care | Payer: Medicare HMO | Source: Ambulatory Visit | Attending: Radiation Oncology | Admitting: Radiation Oncology

## 2018-09-15 ENCOUNTER — Ambulatory Visit: Payer: Medicare HMO

## 2018-09-15 ENCOUNTER — Other Ambulatory Visit: Payer: Self-pay

## 2018-09-15 DIAGNOSIS — Z170 Estrogen receptor positive status [ER+]: Secondary | ICD-10-CM | POA: Diagnosis not present

## 2018-09-15 DIAGNOSIS — C50411 Malignant neoplasm of upper-outer quadrant of right female breast: Secondary | ICD-10-CM | POA: Diagnosis not present

## 2018-09-15 DIAGNOSIS — C50919 Malignant neoplasm of unspecified site of unspecified female breast: Secondary | ICD-10-CM | POA: Diagnosis not present

## 2018-09-16 ENCOUNTER — Ambulatory Visit: Payer: Medicare HMO

## 2018-09-16 ENCOUNTER — Other Ambulatory Visit: Payer: Self-pay

## 2018-09-16 ENCOUNTER — Ambulatory Visit
Admission: RE | Admit: 2018-09-16 | Discharge: 2018-09-16 | Disposition: A | Discharge status: Home / Self Care | Payer: Medicare HMO | Source: Ambulatory Visit | Attending: Radiation Oncology | Admitting: Radiation Oncology

## 2018-09-16 DIAGNOSIS — Z170 Estrogen receptor positive status [ER+]: Secondary | ICD-10-CM | POA: Diagnosis not present

## 2018-09-16 DIAGNOSIS — C50919 Malignant neoplasm of unspecified site of unspecified female breast: Secondary | ICD-10-CM | POA: Diagnosis not present

## 2018-09-16 DIAGNOSIS — C50411 Malignant neoplasm of upper-outer quadrant of right female breast: Secondary | ICD-10-CM | POA: Diagnosis not present

## 2018-09-17 ENCOUNTER — Ambulatory Visit
Admission: RE | Admit: 2018-09-17 | Discharge: 2018-09-17 | Disposition: A | Discharge status: Home / Self Care | Payer: Medicare HMO | Source: Ambulatory Visit | Attending: Radiation Oncology | Admitting: Radiation Oncology

## 2018-09-17 ENCOUNTER — Ambulatory Visit: Payer: Medicare HMO

## 2018-09-17 ENCOUNTER — Other Ambulatory Visit: Payer: Self-pay

## 2018-09-17 DIAGNOSIS — C50919 Malignant neoplasm of unspecified site of unspecified female breast: Secondary | ICD-10-CM | POA: Diagnosis not present

## 2018-09-18 ENCOUNTER — Ambulatory Visit
Admission: RE | Admit: 2018-09-18 | Discharge: 2018-09-18 | Disposition: A | Discharge status: Home / Self Care | Payer: Medicare HMO | Source: Ambulatory Visit | Attending: Radiation Oncology | Admitting: Radiation Oncology

## 2018-09-18 ENCOUNTER — Ambulatory Visit: Payer: Medicare HMO

## 2018-09-18 ENCOUNTER — Other Ambulatory Visit: Payer: Self-pay

## 2018-09-18 DIAGNOSIS — C50411 Malignant neoplasm of upper-outer quadrant of right female breast: Secondary | ICD-10-CM | POA: Diagnosis not present

## 2018-09-18 DIAGNOSIS — C50919 Malignant neoplasm of unspecified site of unspecified female breast: Secondary | ICD-10-CM | POA: Diagnosis not present

## 2018-09-18 DIAGNOSIS — Z170 Estrogen receptor positive status [ER+]: Secondary | ICD-10-CM | POA: Diagnosis not present

## 2018-09-19 ENCOUNTER — Other Ambulatory Visit: Payer: Self-pay

## 2018-09-19 ENCOUNTER — Ambulatory Visit: Payer: Medicare HMO

## 2018-09-19 ENCOUNTER — Ambulatory Visit
Admission: RE | Admit: 2018-09-19 | Discharge: 2018-09-19 | Disposition: A | Discharge status: Home / Self Care | Payer: Medicare HMO | Source: Ambulatory Visit | Attending: Radiation Oncology | Admitting: Radiation Oncology

## 2018-09-19 DIAGNOSIS — C50411 Malignant neoplasm of upper-outer quadrant of right female breast: Secondary | ICD-10-CM | POA: Diagnosis not present

## 2018-09-19 DIAGNOSIS — C50919 Malignant neoplasm of unspecified site of unspecified female breast: Secondary | ICD-10-CM | POA: Diagnosis not present

## 2018-09-19 DIAGNOSIS — Z170 Estrogen receptor positive status [ER+]: Secondary | ICD-10-CM | POA: Diagnosis not present

## 2018-09-22 ENCOUNTER — Ambulatory Visit
Admission: RE | Admit: 2018-09-22 | Discharge: 2018-09-22 | Disposition: A | Discharge status: Home / Self Care | Payer: Medicare HMO | Source: Ambulatory Visit | Attending: Radiation Oncology | Admitting: Radiation Oncology

## 2018-09-22 ENCOUNTER — Other Ambulatory Visit: Payer: Self-pay

## 2018-09-22 ENCOUNTER — Ambulatory Visit: Payer: Medicare HMO

## 2018-09-22 DIAGNOSIS — Z170 Estrogen receptor positive status [ER+]: Secondary | ICD-10-CM | POA: Diagnosis not present

## 2018-09-22 DIAGNOSIS — C50411 Malignant neoplasm of upper-outer quadrant of right female breast: Secondary | ICD-10-CM | POA: Diagnosis not present

## 2018-09-22 DIAGNOSIS — C50919 Malignant neoplasm of unspecified site of unspecified female breast: Secondary | ICD-10-CM | POA: Diagnosis not present

## 2018-09-23 ENCOUNTER — Other Ambulatory Visit: Payer: Self-pay

## 2018-09-23 ENCOUNTER — Ambulatory Visit: Payer: Medicare HMO

## 2018-09-23 ENCOUNTER — Ambulatory Visit
Admission: RE | Admit: 2018-09-23 | Discharge: 2018-09-23 | Disposition: A | Discharge status: Home / Self Care | Payer: Medicare HMO | Source: Ambulatory Visit | Attending: Radiation Oncology | Admitting: Radiation Oncology

## 2018-09-23 DIAGNOSIS — C50411 Malignant neoplasm of upper-outer quadrant of right female breast: Secondary | ICD-10-CM | POA: Diagnosis not present

## 2018-09-23 DIAGNOSIS — C50919 Malignant neoplasm of unspecified site of unspecified female breast: Secondary | ICD-10-CM | POA: Diagnosis not present

## 2018-09-23 DIAGNOSIS — Z170 Estrogen receptor positive status [ER+]: Secondary | ICD-10-CM | POA: Diagnosis not present

## 2018-09-24 ENCOUNTER — Other Ambulatory Visit: Payer: Self-pay

## 2018-09-24 ENCOUNTER — Other Ambulatory Visit: Payer: Self-pay | Admitting: Hematology and Oncology

## 2018-09-24 ENCOUNTER — Ambulatory Visit: Payer: Medicare HMO

## 2018-09-24 ENCOUNTER — Ambulatory Visit
Admission: RE | Admit: 2018-09-24 | Discharge: 2018-09-24 | Disposition: A | Discharge status: Home / Self Care | Payer: Medicare HMO | Source: Ambulatory Visit | Attending: Radiation Oncology | Admitting: Radiation Oncology

## 2018-09-24 DIAGNOSIS — C50919 Malignant neoplasm of unspecified site of unspecified female breast: Secondary | ICD-10-CM | POA: Diagnosis not present

## 2018-09-24 DIAGNOSIS — Z170 Estrogen receptor positive status [ER+]: Secondary | ICD-10-CM | POA: Diagnosis not present

## 2018-09-24 DIAGNOSIS — C50411 Malignant neoplasm of upper-outer quadrant of right female breast: Secondary | ICD-10-CM | POA: Diagnosis not present

## 2018-09-25 ENCOUNTER — Ambulatory Visit
Admission: RE | Admit: 2018-09-25 | Discharge: 2018-09-25 | Disposition: A | Discharge status: Home / Self Care | Payer: Medicare HMO | Source: Ambulatory Visit | Attending: Radiation Oncology | Admitting: Radiation Oncology

## 2018-09-25 ENCOUNTER — Other Ambulatory Visit: Payer: Self-pay

## 2018-09-25 ENCOUNTER — Ambulatory Visit: Payer: Medicare HMO

## 2018-09-25 DIAGNOSIS — Z170 Estrogen receptor positive status [ER+]: Secondary | ICD-10-CM | POA: Diagnosis not present

## 2018-09-25 DIAGNOSIS — C50919 Malignant neoplasm of unspecified site of unspecified female breast: Secondary | ICD-10-CM | POA: Diagnosis not present

## 2018-09-25 DIAGNOSIS — C50411 Malignant neoplasm of upper-outer quadrant of right female breast: Secondary | ICD-10-CM | POA: Diagnosis not present

## 2018-09-26 ENCOUNTER — Other Ambulatory Visit: Payer: Self-pay

## 2018-09-26 ENCOUNTER — Inpatient Hospital Stay: Payer: Medicare HMO | Attending: Hematology and Oncology

## 2018-09-26 ENCOUNTER — Ambulatory Visit
Admission: RE | Admit: 2018-09-26 | Discharge: 2018-09-26 | Disposition: A | Discharge status: Home / Self Care | Payer: Medicare HMO | Source: Ambulatory Visit | Attending: Radiation Oncology | Admitting: Radiation Oncology

## 2018-09-26 ENCOUNTER — Ambulatory Visit: Payer: Medicare HMO

## 2018-09-26 VITALS — BP 150/76 | HR 70 | Temp 97.0°F | Resp 18

## 2018-09-26 DIAGNOSIS — C50411 Malignant neoplasm of upper-outer quadrant of right female breast: Secondary | ICD-10-CM | POA: Diagnosis not present

## 2018-09-26 DIAGNOSIS — Z170 Estrogen receptor positive status [ER+]: Secondary | ICD-10-CM

## 2018-09-26 DIAGNOSIS — Z5112 Encounter for antineoplastic immunotherapy: Secondary | ICD-10-CM | POA: Insufficient documentation

## 2018-09-26 DIAGNOSIS — C50919 Malignant neoplasm of unspecified site of unspecified female breast: Secondary | ICD-10-CM | POA: Diagnosis not present

## 2018-09-26 MED ORDER — SODIUM CHLORIDE 0.9 % IV SOLN
Freq: Once | INTRAVENOUS | Status: AC
  Administered 2018-09-26: 14:00:00 via INTRAVENOUS
  Filled 2018-09-26: qty 250

## 2018-09-26 MED ORDER — ACETAMINOPHEN 325 MG PO TABS
650.0000 mg | ORAL_TABLET | Freq: Once | ORAL | Status: AC
  Administered 2018-09-26: 650 mg via ORAL
  Filled 2018-09-26: qty 2

## 2018-09-26 MED ORDER — HEPARIN SOD (PORK) LOCK FLUSH 100 UNIT/ML IV SOLN
500.0000 [IU] | Freq: Once | INTRAVENOUS | Status: AC | PRN
  Administered 2018-09-26: 500 [IU]
  Filled 2018-09-26: qty 5

## 2018-09-26 MED ORDER — DIPHENHYDRAMINE HCL 25 MG PO CAPS
25.0000 mg | ORAL_CAPSULE | Freq: Once | ORAL | Status: AC
  Administered 2018-09-26: 25 mg via ORAL
  Filled 2018-09-26: qty 1

## 2018-09-26 MED ORDER — TRASTUZUMAB CHEMO 150 MG IV SOLR
300.0000 mg | Freq: Once | INTRAVENOUS | Status: AC
  Administered 2018-09-26: 300 mg via INTRAVENOUS
  Filled 2018-09-26: qty 14.29

## 2018-09-29 ENCOUNTER — Ambulatory Visit: Payer: Medicare HMO

## 2018-09-29 ENCOUNTER — Ambulatory Visit
Admission: RE | Admit: 2018-09-29 | Discharge: 2018-09-29 | Disposition: A | Discharge status: Home / Self Care | Payer: Medicare HMO | Source: Ambulatory Visit | Attending: Radiation Oncology | Admitting: Radiation Oncology

## 2018-09-29 ENCOUNTER — Other Ambulatory Visit: Payer: Self-pay

## 2018-09-29 DIAGNOSIS — C50919 Malignant neoplasm of unspecified site of unspecified female breast: Secondary | ICD-10-CM | POA: Diagnosis not present

## 2018-09-29 DIAGNOSIS — C50411 Malignant neoplasm of upper-outer quadrant of right female breast: Secondary | ICD-10-CM | POA: Diagnosis not present

## 2018-09-29 DIAGNOSIS — Z170 Estrogen receptor positive status [ER+]: Secondary | ICD-10-CM | POA: Diagnosis not present

## 2018-09-30 ENCOUNTER — Other Ambulatory Visit: Payer: Self-pay

## 2018-09-30 ENCOUNTER — Ambulatory Visit
Admission: RE | Admit: 2018-09-30 | Discharge: 2018-09-30 | Disposition: A | Discharge status: Home / Self Care | Payer: Medicare HMO | Source: Ambulatory Visit | Attending: Radiation Oncology | Admitting: Radiation Oncology

## 2018-09-30 ENCOUNTER — Ambulatory Visit: Payer: Medicare HMO

## 2018-09-30 DIAGNOSIS — Z170 Estrogen receptor positive status [ER+]: Secondary | ICD-10-CM | POA: Diagnosis not present

## 2018-09-30 DIAGNOSIS — C50919 Malignant neoplasm of unspecified site of unspecified female breast: Secondary | ICD-10-CM | POA: Diagnosis not present

## 2018-09-30 DIAGNOSIS — C50411 Malignant neoplasm of upper-outer quadrant of right female breast: Secondary | ICD-10-CM | POA: Diagnosis not present

## 2018-10-01 ENCOUNTER — Ambulatory Visit
Admission: RE | Admit: 2018-10-01 | Discharge: 2018-10-01 | Disposition: A | Discharge status: Home / Self Care | Payer: Medicare HMO | Source: Ambulatory Visit | Attending: Radiation Oncology | Admitting: Radiation Oncology

## 2018-10-01 ENCOUNTER — Ambulatory Visit: Payer: Medicare HMO

## 2018-10-01 ENCOUNTER — Other Ambulatory Visit: Payer: Self-pay

## 2018-10-01 DIAGNOSIS — Z170 Estrogen receptor positive status [ER+]: Secondary | ICD-10-CM | POA: Diagnosis not present

## 2018-10-01 DIAGNOSIS — C50411 Malignant neoplasm of upper-outer quadrant of right female breast: Secondary | ICD-10-CM | POA: Diagnosis not present

## 2018-10-01 DIAGNOSIS — C50919 Malignant neoplasm of unspecified site of unspecified female breast: Secondary | ICD-10-CM | POA: Diagnosis not present

## 2018-10-02 ENCOUNTER — Ambulatory Visit: Payer: Medicare HMO

## 2018-10-02 ENCOUNTER — Other Ambulatory Visit: Payer: Self-pay

## 2018-10-02 ENCOUNTER — Ambulatory Visit
Admission: RE | Admit: 2018-10-02 | Discharge: 2018-10-02 | Disposition: A | Discharge status: Home / Self Care | Payer: Medicare HMO | Source: Ambulatory Visit | Attending: Radiation Oncology | Admitting: Radiation Oncology

## 2018-10-02 DIAGNOSIS — C50919 Malignant neoplasm of unspecified site of unspecified female breast: Secondary | ICD-10-CM | POA: Diagnosis not present

## 2018-10-02 DIAGNOSIS — Z170 Estrogen receptor positive status [ER+]: Secondary | ICD-10-CM | POA: Diagnosis not present

## 2018-10-02 DIAGNOSIS — C50411 Malignant neoplasm of upper-outer quadrant of right female breast: Secondary | ICD-10-CM | POA: Diagnosis not present

## 2018-10-03 ENCOUNTER — Ambulatory Visit: Payer: Medicare HMO

## 2018-10-03 ENCOUNTER — Inpatient Hospital Stay: Payer: Medicare HMO

## 2018-10-03 ENCOUNTER — Ambulatory Visit
Admission: RE | Admit: 2018-10-03 | Discharge: 2018-10-03 | Disposition: A | Discharge status: Home / Self Care | Payer: Medicare HMO | Source: Ambulatory Visit | Attending: Radiation Oncology | Admitting: Radiation Oncology

## 2018-10-03 ENCOUNTER — Other Ambulatory Visit: Payer: Self-pay | Admitting: Hematology and Oncology

## 2018-10-03 ENCOUNTER — Other Ambulatory Visit: Payer: Self-pay

## 2018-10-03 VITALS — BP 136/77 | HR 66 | Temp 97.0°F | Resp 18

## 2018-10-03 DIAGNOSIS — C50411 Malignant neoplasm of upper-outer quadrant of right female breast: Secondary | ICD-10-CM | POA: Diagnosis not present

## 2018-10-03 DIAGNOSIS — Z170 Estrogen receptor positive status [ER+]: Principal | ICD-10-CM

## 2018-10-03 DIAGNOSIS — Z5112 Encounter for antineoplastic immunotherapy: Secondary | ICD-10-CM | POA: Diagnosis not present

## 2018-10-03 DIAGNOSIS — C50919 Malignant neoplasm of unspecified site of unspecified female breast: Secondary | ICD-10-CM | POA: Diagnosis not present

## 2018-10-03 LAB — CBC WITH DIFFERENTIAL/PLATELET
Abs Immature Granulocytes: 0.02 10*3/uL (ref 0.00–0.07)
Basophils Absolute: 0.1 10*3/uL (ref 0.0–0.1)
Basophils Relative: 1 %
Eosinophils Absolute: 0.4 10*3/uL (ref 0.0–0.5)
Eosinophils Relative: 5 %
HCT: 35.6 % — ABNORMAL LOW (ref 36.0–46.0)
Hemoglobin: 11.2 g/dL — ABNORMAL LOW (ref 12.0–15.0)
Immature Granulocytes: 0 %
Lymphocytes Relative: 25 %
Lymphs Abs: 2 10*3/uL (ref 0.7–4.0)
MCH: 29.6 pg (ref 26.0–34.0)
MCHC: 31.5 g/dL (ref 30.0–36.0)
MCV: 93.9 fL (ref 80.0–100.0)
Monocytes Absolute: 0.8 10*3/uL (ref 0.1–1.0)
Monocytes Relative: 10 %
Neutro Abs: 4.7 10*3/uL (ref 1.7–7.7)
Neutrophils Relative %: 59 %
Platelets: 238 10*3/uL (ref 150–400)
RBC: 3.79 MIL/uL — ABNORMAL LOW (ref 3.87–5.11)
RDW: 15 % (ref 11.5–15.5)
WBC: 8 10*3/uL (ref 4.0–10.5)
nRBC: 0 % (ref 0.0–0.2)

## 2018-10-03 LAB — COMPREHENSIVE METABOLIC PANEL
ALT: 22 U/L (ref 0–44)
AST: 23 U/L (ref 15–41)
Albumin: 3.5 g/dL (ref 3.5–5.0)
Alkaline Phosphatase: 88 U/L (ref 38–126)
Anion gap: 6 (ref 5–15)
BUN: 16 mg/dL (ref 8–23)
CO2: 26 mmol/L (ref 22–32)
Calcium: 8.6 mg/dL — ABNORMAL LOW (ref 8.9–10.3)
Chloride: 106 mmol/L (ref 98–111)
Creatinine, Ser: 0.6 mg/dL (ref 0.44–1.00)
GFR calc Af Amer: >60 mL/min (ref >60)
GFR calc non Af Amer: >60 mL/min (ref >60)
Glucose, Bld: 106 mg/dL — ABNORMAL HIGH (ref 70–99)
Potassium: 3.9 mmol/L (ref 3.5–5.1)
Sodium: 138 mmol/L (ref 135–145)
Total Bilirubin: 0.4 mg/dL (ref 0.3–1.2)
Total Protein: 6.3 g/dL — ABNORMAL LOW (ref 6.5–8.1)

## 2018-10-03 MED ORDER — SODIUM CHLORIDE 0.9 % IV SOLN
Freq: Once | INTRAVENOUS | Status: AC
  Administered 2018-10-03: 14:00:00 via INTRAVENOUS
  Filled 2018-10-03: qty 250

## 2018-10-03 MED ORDER — SODIUM CHLORIDE 0.9% FLUSH
10.0000 mL | INTRAVENOUS | Status: DC | PRN
  Administered 2018-10-03: 10 mL via INTRAVENOUS
  Filled 2018-10-03: qty 10

## 2018-10-03 MED ORDER — TRASTUZUMAB CHEMO 150 MG IV SOLR
150.0000 mg | Freq: Once | INTRAVENOUS | Status: AC
  Administered 2018-10-03: 150 mg via INTRAVENOUS
  Filled 2018-10-03: qty 7.14

## 2018-10-03 MED ORDER — HEPARIN SOD (PORK) LOCK FLUSH 100 UNIT/ML IV SOLN
500.0000 [IU] | Freq: Once | INTRAVENOUS | Status: AC
  Administered 2018-10-03: 15:00:00 500 [IU] via INTRAVENOUS

## 2018-10-03 MED ORDER — DIPHENHYDRAMINE HCL 25 MG PO CAPS
25.0000 mg | ORAL_CAPSULE | Freq: Once | ORAL | Status: AC
  Administered 2018-10-03: 25 mg via ORAL

## 2018-10-03 MED ORDER — ACETAMINOPHEN 325 MG PO TABS
650.0000 mg | ORAL_TABLET | Freq: Once | ORAL | Status: AC
  Administered 2018-10-03: 650 mg via ORAL

## 2018-10-04 LAB — CANCER ANTIGEN 27.29: CA 27.29: 10.7 U/mL (ref 0.0–38.6)

## 2018-10-06 ENCOUNTER — Ambulatory Visit: Payer: Medicare HMO

## 2018-10-06 ENCOUNTER — Ambulatory Visit
Admission: RE | Admit: 2018-10-06 | Discharge: 2018-10-06 | Disposition: A | Discharge status: Home / Self Care | Payer: Medicare HMO | Source: Ambulatory Visit | Attending: Radiation Oncology | Admitting: Radiation Oncology

## 2018-10-06 DIAGNOSIS — C50411 Malignant neoplasm of upper-outer quadrant of right female breast: Secondary | ICD-10-CM | POA: Diagnosis not present

## 2018-10-06 DIAGNOSIS — C50919 Malignant neoplasm of unspecified site of unspecified female breast: Secondary | ICD-10-CM | POA: Diagnosis not present

## 2018-10-06 DIAGNOSIS — Z170 Estrogen receptor positive status [ER+]: Secondary | ICD-10-CM | POA: Diagnosis not present

## 2018-10-07 ENCOUNTER — Ambulatory Visit
Admission: RE | Admit: 2018-10-07 | Discharge: 2018-10-07 | Disposition: A | Discharge status: Home / Self Care | Payer: Medicare HMO | Source: Ambulatory Visit | Attending: Radiation Oncology | Admitting: Radiation Oncology

## 2018-10-07 ENCOUNTER — Ambulatory Visit: Payer: Medicare HMO

## 2018-10-07 DIAGNOSIS — C50411 Malignant neoplasm of upper-outer quadrant of right female breast: Secondary | ICD-10-CM | POA: Diagnosis not present

## 2018-10-07 DIAGNOSIS — C50919 Malignant neoplasm of unspecified site of unspecified female breast: Secondary | ICD-10-CM | POA: Diagnosis not present

## 2018-10-07 DIAGNOSIS — Z170 Estrogen receptor positive status [ER+]: Secondary | ICD-10-CM | POA: Diagnosis not present

## 2018-10-08 ENCOUNTER — Ambulatory Visit: Payer: Medicare HMO

## 2018-10-08 ENCOUNTER — Other Ambulatory Visit: Payer: Self-pay

## 2018-10-08 ENCOUNTER — Ambulatory Visit
Admission: RE | Admit: 2018-10-08 | Discharge: 2018-10-08 | Disposition: A | Discharge status: Home / Self Care | Payer: Medicare HMO | Source: Ambulatory Visit | Attending: Radiation Oncology | Admitting: Radiation Oncology

## 2018-10-08 DIAGNOSIS — C50919 Malignant neoplasm of unspecified site of unspecified female breast: Secondary | ICD-10-CM | POA: Diagnosis not present

## 2018-10-08 DIAGNOSIS — Z170 Estrogen receptor positive status [ER+]: Secondary | ICD-10-CM | POA: Diagnosis not present

## 2018-10-08 DIAGNOSIS — C50411 Malignant neoplasm of upper-outer quadrant of right female breast: Secondary | ICD-10-CM | POA: Diagnosis not present

## 2018-10-09 ENCOUNTER — Ambulatory Visit: Payer: Medicare HMO

## 2018-10-09 ENCOUNTER — Inpatient Hospital Stay: Payer: Medicare HMO

## 2018-10-09 ENCOUNTER — Other Ambulatory Visit: Payer: Self-pay

## 2018-10-09 ENCOUNTER — Ambulatory Visit
Admission: RE | Admit: 2018-10-09 | Discharge: 2018-10-09 | Disposition: A | Discharge status: Home / Self Care | Payer: Medicare HMO | Source: Ambulatory Visit | Attending: Radiation Oncology | Admitting: Radiation Oncology

## 2018-10-09 ENCOUNTER — Other Ambulatory Visit: Payer: Self-pay | Admitting: Hematology and Oncology

## 2018-10-09 VITALS — BP 145/74 | HR 60 | Temp 98.1°F | Resp 17

## 2018-10-09 DIAGNOSIS — C50919 Malignant neoplasm of unspecified site of unspecified female breast: Secondary | ICD-10-CM | POA: Diagnosis not present

## 2018-10-09 DIAGNOSIS — C50411 Malignant neoplasm of upper-outer quadrant of right female breast: Secondary | ICD-10-CM | POA: Diagnosis not present

## 2018-10-09 DIAGNOSIS — Z170 Estrogen receptor positive status [ER+]: Principal | ICD-10-CM

## 2018-10-09 DIAGNOSIS — Z5112 Encounter for antineoplastic immunotherapy: Secondary | ICD-10-CM | POA: Diagnosis not present

## 2018-10-09 MED ORDER — SODIUM CHLORIDE 0.9% FLUSH
10.0000 mL | INTRAVENOUS | Status: DC | PRN
  Administered 2018-10-09: 14:00:00 10 mL
  Filled 2018-10-09: qty 10

## 2018-10-09 MED ORDER — SODIUM CHLORIDE 0.9 % IV SOLN
Freq: Once | INTRAVENOUS | Status: AC
  Administered 2018-10-09: 14:00:00 via INTRAVENOUS
  Filled 2018-10-09: qty 250

## 2018-10-09 MED ORDER — DIPHENHYDRAMINE HCL 25 MG PO CAPS
25.0000 mg | ORAL_CAPSULE | Freq: Once | ORAL | Status: AC
  Administered 2018-10-09: 14:00:00 25 mg via ORAL
  Filled 2018-10-09: qty 1

## 2018-10-09 MED ORDER — HEPARIN SOD (PORK) LOCK FLUSH 100 UNIT/ML IV SOLN
500.0000 [IU] | Freq: Once | INTRAVENOUS | Status: AC | PRN
  Administered 2018-10-09: 15:00:00 500 [IU]
  Filled 2018-10-09: qty 5

## 2018-10-09 MED ORDER — ACETAMINOPHEN 325 MG PO TABS
650.0000 mg | ORAL_TABLET | Freq: Once | ORAL | Status: AC
  Administered 2018-10-09: 14:00:00 650 mg via ORAL
  Filled 2018-10-09: qty 2

## 2018-10-09 MED ORDER — TRASTUZUMAB CHEMO 150 MG IV SOLR
150.0000 mg | Freq: Once | INTRAVENOUS | Status: AC
  Administered 2018-10-09: 14:00:00 150 mg via INTRAVENOUS
  Filled 2018-10-09: qty 7.14

## 2018-10-10 ENCOUNTER — Ambulatory Visit
Admission: RE | Admit: 2018-10-10 | Discharge: 2018-10-10 | Disposition: A | Discharge status: Home / Self Care | Payer: Medicare HMO | Source: Ambulatory Visit | Attending: Radiation Oncology | Admitting: Radiation Oncology

## 2018-10-10 ENCOUNTER — Ambulatory Visit: Payer: Medicare HMO

## 2018-10-10 ENCOUNTER — Other Ambulatory Visit: Payer: Self-pay

## 2018-10-10 DIAGNOSIS — Z8059 Family history of malignant neoplasm of other urinary tract organ: Secondary | ICD-10-CM | POA: Insufficient documentation

## 2018-10-10 DIAGNOSIS — C50919 Malignant neoplasm of unspecified site of unspecified female breast: Secondary | ICD-10-CM | POA: Insufficient documentation

## 2018-10-10 DIAGNOSIS — Z8052 Family history of malignant neoplasm of bladder: Secondary | ICD-10-CM | POA: Diagnosis not present

## 2018-10-10 DIAGNOSIS — Z8041 Family history of malignant neoplasm of ovary: Secondary | ICD-10-CM | POA: Diagnosis not present

## 2018-10-10 DIAGNOSIS — Z8543 Personal history of malignant neoplasm of ovary: Secondary | ICD-10-CM | POA: Diagnosis not present

## 2018-10-10 DIAGNOSIS — M810 Age-related osteoporosis without current pathological fracture: Secondary | ICD-10-CM | POA: Insufficient documentation

## 2018-10-10 DIAGNOSIS — R05 Cough: Secondary | ICD-10-CM | POA: Insufficient documentation

## 2018-10-10 DIAGNOSIS — Z833 Family history of diabetes mellitus: Secondary | ICD-10-CM | POA: Diagnosis not present

## 2018-10-10 DIAGNOSIS — Z8042 Family history of malignant neoplasm of prostate: Secondary | ICD-10-CM | POA: Diagnosis not present

## 2018-10-10 DIAGNOSIS — Z90721 Acquired absence of ovaries, unilateral: Secondary | ICD-10-CM | POA: Insufficient documentation

## 2018-10-10 DIAGNOSIS — Z79899 Other long term (current) drug therapy: Secondary | ICD-10-CM | POA: Insufficient documentation

## 2018-10-10 DIAGNOSIS — Z170 Estrogen receptor positive status [ER+]: Secondary | ICD-10-CM | POA: Diagnosis not present

## 2018-10-10 DIAGNOSIS — Z8249 Family history of ischemic heart disease and other diseases of the circulatory system: Secondary | ICD-10-CM | POA: Insufficient documentation

## 2018-10-10 DIAGNOSIS — C50411 Malignant neoplasm of upper-outer quadrant of right female breast: Secondary | ICD-10-CM | POA: Diagnosis not present

## 2018-10-10 DIAGNOSIS — G620 Drug-induced polyneuropathy: Secondary | ICD-10-CM | POA: Diagnosis not present

## 2018-10-10 DIAGNOSIS — Z803 Family history of malignant neoplasm of breast: Secondary | ICD-10-CM | POA: Diagnosis not present

## 2018-10-10 DIAGNOSIS — I889 Nonspecific lymphadenitis, unspecified: Secondary | ICD-10-CM | POA: Insufficient documentation

## 2018-10-10 DIAGNOSIS — I10 Essential (primary) hypertension: Secondary | ICD-10-CM | POA: Diagnosis not present

## 2018-10-13 ENCOUNTER — Other Ambulatory Visit: Payer: Self-pay

## 2018-10-13 ENCOUNTER — Ambulatory Visit
Admission: RE | Admit: 2018-10-13 | Discharge: 2018-10-13 | Disposition: A | Discharge status: Home / Self Care | Payer: Medicare HMO | Source: Ambulatory Visit | Attending: Radiation Oncology | Admitting: Radiation Oncology

## 2018-10-13 ENCOUNTER — Ambulatory Visit: Payer: Medicare HMO

## 2018-10-13 DIAGNOSIS — C50411 Malignant neoplasm of upper-outer quadrant of right female breast: Secondary | ICD-10-CM | POA: Diagnosis not present

## 2018-10-13 DIAGNOSIS — C50919 Malignant neoplasm of unspecified site of unspecified female breast: Secondary | ICD-10-CM | POA: Diagnosis not present

## 2018-10-13 DIAGNOSIS — Z170 Estrogen receptor positive status [ER+]: Secondary | ICD-10-CM | POA: Diagnosis not present

## 2018-10-14 ENCOUNTER — Ambulatory Visit
Admission: RE | Admit: 2018-10-14 | Discharge: 2018-10-14 | Disposition: A | Discharge status: Home / Self Care | Payer: Medicare HMO | Source: Ambulatory Visit | Attending: Radiation Oncology | Admitting: Radiation Oncology

## 2018-10-14 ENCOUNTER — Other Ambulatory Visit: Payer: Self-pay

## 2018-10-14 ENCOUNTER — Ambulatory Visit: Payer: Medicare HMO

## 2018-10-14 DIAGNOSIS — C50919 Malignant neoplasm of unspecified site of unspecified female breast: Secondary | ICD-10-CM | POA: Diagnosis not present

## 2018-10-14 DIAGNOSIS — C50411 Malignant neoplasm of upper-outer quadrant of right female breast: Secondary | ICD-10-CM | POA: Diagnosis not present

## 2018-10-14 DIAGNOSIS — Z170 Estrogen receptor positive status [ER+]: Secondary | ICD-10-CM | POA: Diagnosis not present

## 2018-10-15 ENCOUNTER — Ambulatory Visit
Admission: RE | Admit: 2018-10-15 | Discharge: 2018-10-15 | Disposition: A | Discharge status: Home / Self Care | Payer: Medicare HMO | Source: Ambulatory Visit | Attending: Radiation Oncology | Admitting: Radiation Oncology

## 2018-10-15 ENCOUNTER — Other Ambulatory Visit: Payer: Self-pay

## 2018-10-15 ENCOUNTER — Ambulatory Visit: Payer: Medicare HMO

## 2018-10-15 DIAGNOSIS — Z170 Estrogen receptor positive status [ER+]: Secondary | ICD-10-CM | POA: Diagnosis not present

## 2018-10-15 DIAGNOSIS — C50411 Malignant neoplasm of upper-outer quadrant of right female breast: Secondary | ICD-10-CM

## 2018-10-15 DIAGNOSIS — C50919 Malignant neoplasm of unspecified site of unspecified female breast: Secondary | ICD-10-CM | POA: Diagnosis not present

## 2018-10-15 DIAGNOSIS — Z5112 Encounter for antineoplastic immunotherapy: Secondary | ICD-10-CM

## 2018-10-15 DIAGNOSIS — Z5111 Encounter for antineoplastic chemotherapy: Secondary | ICD-10-CM

## 2018-10-15 NOTE — Progress Notes (Signed)
[facility]  [address], [address] [place], [place] [address] Phone: [phone]  Fax: [phone]   Clinic Day:  10/16/2018  Referring physician: Einar Pheasant, MD  Chief Complaint: [name] [name] is a 85 y.o. female with stage I Her2/neu + breast cancer who is seen for 6 week assessment during radiation and continuation of Herceptin.   HPI: The patient was last seen in the medical oncology clinic on 09/05/2018. At that time, she was doing well.  Right sided adenitis had resolved.  Exam revealed a fingertip size right cervical lymph node.  She began whole breast radiation on 09/15/2018.  She has continued weekly Herceptin (09/05/2018; 09/26/2018 - 10/09/2018).  CBC on 10/03/2018 showed WBC 8,000 (ANC 2,000), hemoglobin 11.2, hematocrit 35.6, platelets 238,000. Calcium was 8.6. Total protein 6.3. CA 27.29 was 10.7.   During the interim, she has been "pretty good." She has mild neuropathy in her toes.   She had her 24th of 36 radiation treatments this morning. She will finish radiation on 11/04/2018. She has some redness and itching on her right breast.   She has been eating well. Weight is down 6 pounds since last clinic visit.   She has not discussed Prolia injections with her dentist, who she has not seen since prior to starting treatment. She is interested in treatment of her osteoporosis and would like to talk to Dr Einar Pheasant about it.  She asked if she could take a Biotin supplement to support her hair growth.    Past Medical History:  Diagnosis Date  . Diverticulitis of sigmoid colon   . Endometriosis   . Fibrocystic breast disease   . Glaucoma   . H/O urinary incontinence   . History of chicken pox   . History of colon polyps 2014  . Hyperlipidemia   . Hypertension   . Ovarian cancer ([place]) 2002   Chemo tx's @ [facility] with Total Hysterectomy. Dr Amalia Hailey  . Personal history of chemotherapy    ovarian cancer  . Port-A-Cath in place   .  Sinusitis   . Tuberculosis    positve skin test    Past Surgical History:  Procedure Laterality Date  . ABDOMINAL HYSTERECTOMY  2003   complete Dr Amalia Hailey  . ABSCESS DRAINAGE  2017   right buttock [facility]  . APPENDECTOMY  2003  . BREAST EXCISIONAL BIOPSY Left 1993   excisional bx. negative results  . BREAST SURGERY Left 1982   papilloma removal  . COLONOSCOPY  2006, 2014   Dr Vira Agar   . COLONOSCOPY N/A 03/27/2016   Procedure: COLONOSCOPY;  Surgeon: Robert Bellow, MD;  Location: [facility] ENDOSCOPY;  Service: Endoscopy;  Laterality: N/A;  . COLOSTOMY REVERSAL  2014   at [facility] Dr Raynaldo Opitz  . NASAL SINUS SURGERY     For fungal infection  . NASAL SINUS SURGERY  8/05 8/06  . OOPHORECTOMY    . OSTOMY  05/22/2012   diverticulitis with abcess/ Dr Tamala Julian  . ovarian cancer     Exploratory lap  . PORTA CATH INSERTION N/A 05/26/2018   Procedure: PORTA CATH INSERTION;  Surgeon: Algernon Huxley, MD;  Location: [facility] [facility];  Service: Cardiovascular;  Laterality: N/A;  . TONSILLECTOMY AND ADENOIDECTOMY  1947    Family History  Problem Relation Age of Onset  . Hypertension Mother   . Coronary artery disease Mother   . Heart disease Mother   . Kidney disease Mother   . Diabetes Mother   .  Cancer Mother        uterine cancer  . Diabetes Father   . Kidney failure Father   . Diabetes Brother   . Coronary artery disease Brother   . Cancer Brother        bladder cancer  . Stroke Sister   . Heart disease Sister        [facility]  . Cancer Maternal Aunt        ovarian cancer  . Breast cancer Cousin   . Cancer Brother        Prostate  . Breast cancer Cousin   . Breast cancer Cousin     Social History:  reports that she has never smoked. She has never used smokeless tobacco. She reports that she does not drink alcohol or use drugs. Patient is a retired Engineer, site. Generally accompanied to appointments by friend, [name]. She lives in [place].  The patient is  alone today.   Allergies: No Known Allergies  Current Medications: Current Outpatient Medications  Medication Sig Dispense Refill  . amLODipine (NORVASC) 2.5 MG tablet Take 1 tablet (2.5 mg total) by mouth daily. 30 tablet 2  . Calcium Carbonate-Vitamin D 600-400 MG-UNIT tablet Take 1 tablet by mouth daily.     Marland Kitchen lidocaine-prilocaine (EMLA) cream Apply 1 application topically as needed. 30 g 5  . Multiple Vitamins-Minerals (MULTIVITAMIN WITH MINERALS) tablet Take 1 tablet by mouth daily.    . prochlorperazine (COMPAZINE) 10 MG tablet Take 1 tablet (10 mg total) by mouth every 6 (six) hours as needed for nausea or vomiting. (Patient not taking: Reported on 10/16/2018) 30 tablet 3   Current Facility-Administered Medications  Medication Dose Route Frequency Provider Last Rate Last Dose  . lidocaine-prilocaine (EMLA) cream   Topical PRN Lucky Cowboy, Erskine Squibb, MD       Facility-Administered Medications Ordered in Other Visits  Medication Dose Route Frequency Provider Last Rate Last Dose  . heparin lock flush 100 unit/mL  500 Units Intravenous Once Corcoran, Melissa C, MD      . sodium chloride 0.9 % 50 mL with famotidine (PEPCID) 20 mg infusion  20 mg Intravenous Once Charlaine Dalton R, MD      . sodium chloride flush (NS) 0.9 % injection 10 mL  10 mL Intravenous PRN Cammie Sickle, MD   10 mL at 06/24/18 0854  . sodium chloride flush (NS) 0.9 % injection 10 mL  10 mL Intracatheter PRN Cammie Sickle, MD   10 mL at 07/01/18 0830  . sodium chloride flush (NS) 0.9 % injection 10 mL  10 mL Intravenous PRN Nolon Stalls C, MD   10 mL at 10/16/18 1357    Review of Systems  Constitutional: Positive for weight loss (6 pounds). Negative for chills, diaphoresis, fever and malaise/fatigue.       Feels "pretty good".  HENT: Positive for tinnitus. Negative for congestion, ear discharge, nosebleeds, sinus pain and sore throat.   Eyes: Negative.  Negative for blurred vision, double vision,  photophobia and pain.  Respiratory: Negative.  Negative for cough, hemoptysis, sputum production, shortness of breath and wheezing.   Cardiovascular: Negative.  Negative for chest pain, palpitations, orthopnea, leg swelling (chronic) and PND.  Gastrointestinal: Negative for abdominal pain, blood in stool, constipation, diarrhea, melena, nausea and vomiting.       Eating well.  Genitourinary: Negative.  Negative for dysuria, frequency, hematuria and urgency.  Musculoskeletal: Positive for joint pain (bilateral knees). Negative for back pain, falls, myalgias  and neck pain.  Skin: Positive for itching (right breast) and rash (radiation dermatitis).  Neurological: Positive for tingling and sensory change (stable neuropathy in BILATERAL feet). Negative for dizziness, tremors, speech change, focal weakness, weakness and headaches.  Endo/Heme/Allergies: Negative.  Does not bruise/bleed easily.  Psychiatric/Behavioral: Negative.  Negative for depression and memory loss. The patient is not nervous/anxious and does not have insomnia.   All other systems reviewed and are negative.  Performance status (ECOG): 1  Physical Exam  Constitutional: She is oriented to person, place, and time. She appears well-developed and well-nourished. No distress.  HENT:  Head: Normocephalic and atraumatic.  Mouth/Throat: Oropharynx is clear and moist.  Wearing a crochet cap. Wearing a mask. Gray/black hair.  Eyes: Pupils are equal, round, and reactive to light. Conjunctivae and EOM are normal. No scleral icterus.  Glasses.  Brown eyes.   Neck: Normal range of motion. Neck supple. No JVD present.  Cardiovascular: Normal rate, regular rhythm and normal heart sounds. Exam reveals no gallop and no friction rub.  No murmur heard. Pulmonary/Chest: Effort normal and breath sounds normal. No respiratory distress. She has no wheezes. She has no rales.  Abdominal: Soft. Bowel sounds are normal. She exhibits no distension and no  mass. There is no abdominal tenderness. There is no rebound and no guarding.  Musculoskeletal: Normal range of motion.        General: No tenderness or edema.  Lymphadenopathy:       Head (right side): No submental, no submandibular, no tonsillar, no preauricular, no posterior auricular and no occipital adenopathy present.       Head (left side): No submental, no submandibular, no tonsillar, no preauricular, no posterior auricular and no occipital adenopathy present.    She has no cervical adenopathy.    She has no axillary adenopathy.       Right: No supraclavicular adenopathy present.       Left: No supraclavicular adenopathy present.  Neurological: She is alert and oriented to person, place, and time.  Skin: Skin is warm and dry. No rash noted. She is not diaphoretic. No erythema.  Psychiatric: She has a normal mood and affect. Her behavior is normal. Judgment and thought content normal.  Nursing note and vitals reviewed.   Infusion on 10/16/2018  Component Date Value Ref Range Status  . Sodium 10/16/2018 139  135 - 145 mmol/L Final  . Potassium 10/16/2018 3.5  3.5 - 5.1 mmol/L Final  . Chloride 10/16/2018 103  98 - 111 mmol/L Final  . CO2 10/16/2018 27  22 - 32 mmol/L Final  . Glucose, Bld 10/16/2018 108* 70 - 99 mg/dL Final  . BUN 10/16/2018 16  8 - 23 mg/dL Final  . Creatinine, Ser 10/16/2018 0.58  0.44 - 1.00 mg/dL Final  . Calcium 10/16/2018 8.8* 8.9 - 10.3 mg/dL Final  . Total Protein 10/16/2018 6.6  6.5 - 8.1 g/dL Final  . Albumin 10/16/2018 3.7  3.5 - 5.0 g/dL Final  . AST 10/16/2018 24  15 - 41 U/L Final  . ALT 10/16/2018 19  0 - 44 U/L Final  . Alkaline Phosphatase 10/16/2018 94  38 - 126 U/L Final  . Total Bilirubin 10/16/2018 0.6  0.3 - 1.2 mg/dL Final  . GFR calc non Af Amer 10/16/2018 >60  >60 mL/min Final  . GFR calc Af Amer 10/16/2018 >60  >60 mL/min Final  . Anion gap 10/16/2018 9  5 - 15 Final   Performed at [facility],  [address]  [place], [place] [address]  . WBC 10/16/2018 7.0  4.0 - 10.5 K/uL Final  . RBC 10/16/2018 4.06  3.87 - 5.11 MIL/uL Final  . Hemoglobin 10/16/2018 12.2  12.0 - 15.0 g/dL Final  . HCT 10/16/2018 37.5  36.0 - 46.0 % Final  . MCV 10/16/2018 92.4  80.0 - 100.0 fL Final  . MCH 10/16/2018 30.0  26.0 - 34.0 pg Final  . MCHC 10/16/2018 32.5  30.0 - 36.0 g/dL Final  . RDW 10/16/2018 14.7  11.5 - 15.5 % Final  . Platelets 10/16/2018 233  150 - 400 K/uL Final  . nRBC 10/16/2018 0.0  0.0 - 0.2 % Final  . Neutrophils Relative % 10/16/2018 60  % Final  . Neutro Abs 10/16/2018 4.2  1.7 - 7.7 K/uL Final  . Lymphocytes Relative 10/16/2018 23  % Final  . Lymphs Abs 10/16/2018 1.6  0.7 - 4.0 K/uL Final  . Monocytes Relative 10/16/2018 11  % Final  . Monocytes Absolute 10/16/2018 0.8  0.1 - 1.0 K/uL Final  . Eosinophils Relative 10/16/2018 5  % Final  . Eosinophils Absolute 10/16/2018 0.3  0.0 - 0.5 K/uL Final  . Basophils Relative 10/16/2018 1  % Final  . Basophils Absolute 10/16/2018 0.1  0.0 - 0.1 K/uL Final  . Immature Granulocytes 10/16/2018 0  % Final  . Abs Immature Granulocytes 10/16/2018 0.02  0.00 - 0.07 K/uL Final   Performed at [facility], [address], [place], [place] [address]    Assessment:  [name] [name] is a 85 y.o. female with stage I Her2/neu + breast cancer s/p lumpectomy and sentinel lymph node biopsy on 03/18/2018.  Pathology revealed a 0.6 cm grade III invasive mammary carcinoma.  There was DCIS.  There was no lymphovascular invasion.  Tumor was ER + (99%), PR + (25%), and Her2/neu + by FISH.  Pathologic stage was T1bN0.  Screening mammogram on 01/17/2018 revealed calcifications and possible mass in the right breast.  Diagnostic mammogram and ultrasound on 01/22/2018 revealed indeterminate group of microcalcifications over the right upper outer quadrant spanning 4 x 9 x 11 mm.  There was a 4 x 7 x 7 mm probable complicated cyst at the 11 o'clock position of the right breast  5 cm from the nipple.  She received 12 weeks of Herceptin and Taxol (05/20/2018 - 08/05/2018).  She has continued weekly Herceptin (09/05/2018; 09/26/2018 - 10/09/2018).  She began whole breast radiation on 09/15/2018.   CA27.29 has been followed: 10.7 on 10/03/2018 and < 3.5 on 10/16/2018.  Echo at [facility] on 03/12/2018 revealed an EF of 61%. Echo at [facility] on 08/18/2018 that revealed an LVEF of 50 to 55%.  There was a trivial pericardial effusion was present.  He has a history of stage IIB ovarian cancer s/p exploratory laparotomy, TAH/BSO, omentectomy, peritoneal biopsies, selective pelvic and periaortic lymph node sampling on 06/20/2001.  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  Uterine serosa and the surface of the contralateral ovary were positive for metastatic disease. The primary tumor was approximately 15 cm in diameter.  She received 6 cycles of carboplatin and Taxol.  Bone density on 04/01/2017 revealed osteoporosis with a T-score of -2.5 in the left femoral neck.  She has grade I neuropathy in her feet.  She has chronic right lower extremity edema.  Right lower extremity duplex on 07/22/2018 revealed no femoropopliteal and no calf DVT in the visualized calf veins.  Symptomatically, she  is doing well.  Exam is unremarkable.  CA27.29 is normal.  Plan: 1. Labs today:  CBC with diff, CMP, CA27.29. 2. Stage I HER-2/neu (+) breast cancer She is clinically doing well. Patient is s/p weekly Herceptin and paclitaxel x12. Echo on 08/18/2018 revealed an EF of 50 to 55%.   Schedule next echo on 11/18/2018. Continue Herceptin weekly to complete a year of adjuvant Her2/neu therapy.             She is s/p 33 of 36 radiation treatments (completes on 11/04/2018). 3. Cervical adenitis Resolved. 4. Chemotherapy-induced neuropathy She has a stable grade I neuropathy in feet only.  Continue to monitor. 5. Osteoporosis Continue calcium and vitamin D. Re-review  Prolia to prevent bone thinning/loss associated with endocrine therapy.  Patient to begin endocrine therapy after radiation complete. Patient will require dental clearance. Patient to follow-up with Dr. Einar Pheasant re: Prolia in 11/2018. 6.   Herceptin today and weekly x 3. 7.   Echo on 11/18/2018.  8.   RTC in 4 weeks for MD assessment, labs (CBC with diff, CMP, Mg, CA27.29), Herceptin, and initiation of hormonal therapy.  I discussed the assessment and treatment plan with the patient.  The patient was provided an opportunity to ask questions and all were answered.  The patient agreed with the plan and demonstrated an understanding of the instructions.  The patient was advised to call back if the symptoms worsen or if the condition fails to improve as anticipated.  I provided 25 minutes of face-to-face time during this this encounter and > 50% was spent counseling as documented under my assessment and plan.    Lequita Asal, MD, PhD    10/16/2018, 3:11 PM  I, Molly Dorshimer, am acting as Education administrator for [employer]. Mike Gip, MD, PhD.  I, Melissa C. Mike Gip, MD, have reviewed the above documentation for accuracy and completeness, and I agree with the above.

## 2018-10-16 ENCOUNTER — Inpatient Hospital Stay: Payer: Medicare HMO

## 2018-10-16 ENCOUNTER — Other Ambulatory Visit: Payer: Self-pay

## 2018-10-16 ENCOUNTER — Ambulatory Visit
Admission: RE | Admit: 2018-10-16 | Discharge: 2018-10-16 | Disposition: A | Discharge status: Home / Self Care | Payer: Medicare HMO | Source: Ambulatory Visit | Attending: Radiation Oncology | Admitting: Radiation Oncology

## 2018-10-16 ENCOUNTER — Encounter: Payer: Self-pay | Admitting: Hematology and Oncology

## 2018-10-16 ENCOUNTER — Inpatient Hospital Stay: Payer: Medicare HMO | Attending: Hematology and Oncology | Admitting: Hematology and Oncology

## 2018-10-16 ENCOUNTER — Ambulatory Visit: Payer: Medicare HMO

## 2018-10-16 VITALS — BP 158/91 | HR 63 | Temp 97.9°F | Resp 16 | Wt 160.5 lb

## 2018-10-16 VITALS — BP 157/85 | HR 63 | Temp 97.0°F | Resp 20

## 2018-10-16 DIAGNOSIS — C50411 Malignant neoplasm of upper-outer quadrant of right female breast: Secondary | ICD-10-CM | POA: Diagnosis not present

## 2018-10-16 DIAGNOSIS — C50919 Malignant neoplasm of unspecified site of unspecified female breast: Secondary | ICD-10-CM | POA: Diagnosis not present

## 2018-10-16 DIAGNOSIS — Z90710 Acquired absence of both cervix and uterus: Secondary | ICD-10-CM | POA: Diagnosis not present

## 2018-10-16 DIAGNOSIS — R600 Localized edema: Secondary | ICD-10-CM

## 2018-10-16 DIAGNOSIS — Z5111 Encounter for antineoplastic chemotherapy: Secondary | ICD-10-CM

## 2018-10-16 DIAGNOSIS — Z170 Estrogen receptor positive status [ER+]: Secondary | ICD-10-CM | POA: Insufficient documentation

## 2018-10-16 DIAGNOSIS — Z90722 Acquired absence of ovaries, bilateral: Secondary | ICD-10-CM

## 2018-10-16 DIAGNOSIS — M810 Age-related osteoporosis without current pathological fracture: Secondary | ICD-10-CM | POA: Diagnosis not present

## 2018-10-16 DIAGNOSIS — L599 Disorder of the skin and subcutaneous tissue related to radiation, unspecified: Secondary | ICD-10-CM | POA: Diagnosis not present

## 2018-10-16 DIAGNOSIS — G620 Drug-induced polyneuropathy: Secondary | ICD-10-CM | POA: Diagnosis not present

## 2018-10-16 DIAGNOSIS — L298 Other pruritus: Secondary | ICD-10-CM | POA: Diagnosis not present

## 2018-10-16 DIAGNOSIS — Z5112 Encounter for antineoplastic immunotherapy: Secondary | ICD-10-CM | POA: Diagnosis not present

## 2018-10-16 DIAGNOSIS — Z9221 Personal history of antineoplastic chemotherapy: Secondary | ICD-10-CM | POA: Diagnosis not present

## 2018-10-16 DIAGNOSIS — Z8543 Personal history of malignant neoplasm of ovary: Secondary | ICD-10-CM

## 2018-10-16 DIAGNOSIS — T451X5A Adverse effect of antineoplastic and immunosuppressive drugs, initial encounter: Secondary | ICD-10-CM | POA: Diagnosis not present

## 2018-10-16 LAB — CBC WITH DIFFERENTIAL/PLATELET
Abs Immature Granulocytes: 0.02 10*3/uL (ref 0.00–0.07)
Basophils Absolute: 0.1 10*3/uL (ref 0.0–0.1)
Basophils Relative: 1 %
Eosinophils Absolute: 0.3 10*3/uL (ref 0.0–0.5)
Eosinophils Relative: 5 %
HCT: 37.5 % (ref 36.0–46.0)
Hemoglobin: 12.2 g/dL (ref 12.0–15.0)
Immature Granulocytes: 0 %
Lymphocytes Relative: 23 %
Lymphs Abs: 1.6 10*3/uL (ref 0.7–4.0)
MCH: 30 pg (ref 26.0–34.0)
MCHC: 32.5 g/dL (ref 30.0–36.0)
MCV: 92.4 fL (ref 80.0–100.0)
Monocytes Absolute: 0.8 10*3/uL (ref 0.1–1.0)
Monocytes Relative: 11 %
Neutro Abs: 4.2 10*3/uL (ref 1.7–7.7)
Neutrophils Relative %: 60 %
Platelets: 233 10*3/uL (ref 150–400)
RBC: 4.06 MIL/uL (ref 3.87–5.11)
RDW: 14.7 % (ref 11.5–15.5)
WBC: 7 10*3/uL (ref 4.0–10.5)
nRBC: 0 % (ref 0.0–0.2)

## 2018-10-16 LAB — COMPREHENSIVE METABOLIC PANEL
ALT: 19 U/L (ref 0–44)
AST: 24 U/L (ref 15–41)
Albumin: 3.7 g/dL (ref 3.5–5.0)
Alkaline Phosphatase: 94 U/L (ref 38–126)
Anion gap: 9 (ref 5–15)
BUN: 16 mg/dL (ref 8–23)
CO2: 27 mmol/L (ref 22–32)
Calcium: 8.8 mg/dL — ABNORMAL LOW (ref 8.9–10.3)
Chloride: 103 mmol/L (ref 98–111)
Creatinine, Ser: 0.58 mg/dL (ref 0.44–1.00)
GFR calc Af Amer: >60 mL/min (ref >60)
GFR calc non Af Amer: >60 mL/min (ref >60)
Glucose, Bld: 108 mg/dL — ABNORMAL HIGH (ref 70–99)
Potassium: 3.5 mmol/L (ref 3.5–5.1)
Sodium: 139 mmol/L (ref 135–145)
Total Bilirubin: 0.6 mg/dL (ref 0.3–1.2)
Total Protein: 6.6 g/dL (ref 6.5–8.1)

## 2018-10-16 MED ORDER — SODIUM CHLORIDE 0.9% FLUSH
10.0000 mL | INTRAVENOUS | Status: DC | PRN
  Administered 2018-10-16: 10 mL via INTRAVENOUS
  Filled 2018-10-16: qty 10

## 2018-10-16 MED ORDER — ACETAMINOPHEN 325 MG PO TABS
650.0000 mg | ORAL_TABLET | Freq: Once | ORAL | Status: AC
  Administered 2018-10-16: 16:00:00 650 mg via ORAL

## 2018-10-16 MED ORDER — DIPHENHYDRAMINE HCL 25 MG PO CAPS
25.0000 mg | ORAL_CAPSULE | Freq: Once | ORAL | Status: AC
  Administered 2018-10-16: 16:00:00 25 mg via ORAL

## 2018-10-16 MED ORDER — TRASTUZUMAB CHEMO 150 MG IV SOLR
150.0000 mg | Freq: Once | INTRAVENOUS | Status: AC
  Administered 2018-10-16: 16:00:00 150 mg via INTRAVENOUS
  Filled 2018-10-16: qty 7.14

## 2018-10-16 MED ORDER — HEPARIN SOD (PORK) LOCK FLUSH 100 UNIT/ML IV SOLN
500.0000 [IU] | Freq: Once | INTRAVENOUS | Status: AC
  Administered 2018-10-16: 17:00:00 500 [IU] via INTRAVENOUS
  Filled 2018-10-16: qty 5

## 2018-10-16 MED ORDER — DIPHENHYDRAMINE HCL 25 MG PO CAPS
ORAL_CAPSULE | ORAL | Status: AC
  Filled 2018-10-16: qty 1

## 2018-10-16 MED ORDER — ACETAMINOPHEN 325 MG PO TABS
ORAL_TABLET | ORAL | Status: AC
  Filled 2018-10-16: qty 2

## 2018-10-16 MED ORDER — SODIUM CHLORIDE 0.9 % IV SOLN
Freq: Once | INTRAVENOUS | Status: AC
  Administered 2018-10-16: 16:00:00 via INTRAVENOUS
  Filled 2018-10-16: qty 250

## 2018-10-16 NOTE — Progress Notes (Signed)
Pt here for follow up. Denies any concerns at this time.  

## 2018-10-16 NOTE — Patient Instructions (Signed)
Letrozole tablets What is this medicine? LETROZOLE (LET roe zole) blocks the production of estrogen. It is used to treat breast cancer. This medicine may be used for other purposes; ask your health care provider or pharmacist if you have questions. COMMON BRAND NAME(S): Femara What should I tell my health care provider before I take this medicine? They need to know if you have any of these conditions: -high cholesterol -liver disease -osteoporosis (weak bones) -an unusual or allergic reaction to letrozole, other medicines, foods, dyes, or preservatives -pregnant or trying to get pregnant -breast-feeding How should I use this medicine? Take this medicine by mouth with a glass of water. You may take it with or without food. Follow the directions on the prescription label. Take your medicine at regular intervals. Do not take your medicine more often than directed. Do not stop taking except on your doctor's advice. Talk to your pediatrician regarding the use of this medicine in children. Special care may be needed. Overdosage: If you think you have taken too much of this medicine contact a poison control center or emergency room at once. NOTE: This medicine is only for you. Do not share this medicine with others. What if I miss a dose? If you miss a dose, take it as soon as you can. If it is almost time for your next dose, take only that dose. Do not take double or extra doses. What may interact with this medicine? Do not take this medicine with any of the following medications: -estrogens, like hormone replacement therapy or birth control pills This medicine may also interact with the following medications: -dietary supplements such as androstenedione or DHEA -prasterone -tamoxifen This list may not describe all possible interactions. Give your health care provider a list of all the medicines, herbs, non-prescription drugs, or dietary supplements you use. Also tell them if you smoke, drink  alcohol, or use illegal drugs. Some items may interact with your medicine. What should I watch for while using this medicine? Tell your doctor or healthcare professional if your symptoms do not start to get better or if they get worse. Do not become pregnant while taking this medicine or for 3 weeks after stopping it. Women should inform their doctor if they wish to become pregnant or think they might be pregnant. There is a potential for serious side effects to an unborn child. Talk to your health care professional or pharmacist for more information. Do not breast-feed while taking this medicine or for 3 weeks after stopping it. This medicine may interfere with the ability to have a child. Talk with your doctor or health care professional if you are concerned about your fertility. Using this medicine for a long time may increase your risk of low bone mass. Talk to your doctor about bone health. You may get drowsy or dizzy. Do not drive, use machinery, or do anything that needs mental alertness until you know how this medicine affects you. Do not stand or sit up quickly, especially if you are an older patient. This reduces the risk of dizzy or fainting spells. You may need blood work done while you are taking this medicine. What side effects may I notice from receiving this medicine? Side effects that you should report to your doctor or health care professional as soon as possible: -allergic reactions like skin rash, itching, or hives -bone fracture -chest pain -signs and symptoms of a blood clot such as breathing problems; changes in vision; chest pain; severe, sudden headache; pain, swelling,   warmth in the leg; trouble speaking; sudden numbness or weakness of the face, arm or leg -vaginal bleeding Side effects that usually do not require medical attention (report to your doctor or health care professional if they continue or are bothersome): -bone, back, joint, or muscle  pain -dizziness -fatigue -fluid retention -headache -hot flashes, night sweats -nausea -weight gain This list may not describe all possible side effects. Call your doctor for medical advice about side effects. You may report side effects to FDA at 1-800-FDA-1088. Where should I keep my medicine? Keep out of the reach of children. Store between 15 and 30 degrees C (59 and 86 degrees F). Throw away any unused medicine after the expiration date. NOTE: This sheet is a summary. It may not cover all possible information. If you have questions about this medicine, talk to your doctor, pharmacist, or health care provider.  2019 Elsevier/Gold Standard (2016-01-02 11:10:41) Tamoxifen oral tablet What is this medicine? TAMOXIFEN (ta MOX i fen) blocks the effects of estrogen. It is commonly used to treat breast cancer. It is also used to decrease the chance of breast cancer coming back in women who have received treatment for the disease. It may also help prevent breast cancer in women who have a high risk of developing breast cancer. This medicine may be used for other purposes; ask your health care provider or pharmacist if you have questions. COMMON BRAND NAME(S): Nolvadex What should I tell my health care provider before I take this medicine? They need to know if you have any of these conditions: -blood clots -blood disease -cataracts or impaired eyesight -endometriosis -high calcium levels -high cholesterol -irregular menstrual cycles -liver disease -stroke -uterine fibroids -an unusual reaction to tamoxifen, other medicines, foods, dyes, or preservatives -pregnant or trying to get pregnant -breast-feeding How should I use this medicine? Take this medicine by mouth with a glass of water. Follow the directions on the prescription label. You can take it with or without food. Take your medicine at regular intervals. Do not take your medicine more often than directed. Do not stop taking  except on your doctor's advice. A special MedGuide will be given to you by the pharmacist with each prescription and refill. Be sure to read this information carefully each time. Talk to your pediatrician regarding the use of this medicine in children. While this drug may be prescribed for selected conditions, precautions do apply. Overdosage: If you think you have taken too much of this medicine contact a poison control center or emergency room at once. NOTE: This medicine is only for you. Do not share this medicine with others. What if I miss a dose? If you miss a dose, take it as soon as you can. If it is almost time for your next dose, take only that dose. Do not take double or extra doses. What may interact with this medicine? Do not take this medicine with any of the following medications: -cisapride -certain medicines for irregular heart beat like dofetilide, dronedarone, quinidine -certain medicines for fungal infection like fluconazole, posaconazole -pimozide -saquinavir -thioridazine This medicine may also interact with the following medications: -aminoglutethimide -anastrozole -bromocriptine -chemotherapy drugs -female hormones, like estrogens and birth control pills -letrozole -medroxyprogesterone -phenobarbital -rifampin -warfarin This list may not describe all possible interactions. Give your health care provider a list of all the medicines, herbs, non-prescription drugs, or dietary supplements you use. Also tell them if you smoke, drink alcohol, or use illegal drugs. Some items may interact with your medicine. What  should I watch for while using this medicine? Visit your doctor or health care professional for regular checks on your progress. You will need regular pelvic exams, breast exams, and mammograms. If you are taking this medicine to reduce your risk of getting breast cancer, you should know that this medicine does not prevent all types of breast cancer. If breast  cancer or other problems occur, there is no guarantee that it will be found at an early stage. Do not become pregnant while taking this medicine or for 2 months after stopping it. Women should inform their doctor if they wish to become pregnant or think they might be pregnant. There is a potential for serious side effects to an unborn child. Talk to your health care professional or pharmacist for more information. Do not breast-feed an infant while taking this medicine or for 3 months after stopping it. This medicine may interfere with the ability to have a child. Talk with your doctor or health care professional if you are concerned about your fertility. What side effects may I notice from receiving this medicine? Side effects that you should report to your doctor or health care professional as soon as possible: -allergic reactions like skin rash, itching or hives, swelling of the face, lips, or tongue -changes in vision -changes in your menstrual cycle -difficulty walking or talking -new breast lumps -numbness -pelvic pain or pressure -redness, blistering, peeling or loosening of the skin, including inside the mouth -signs and symptoms of a dangerous change in heartbeat or heart rhythm like chest pain, dizziness, fast or irregular heartbeat, palpitations, feeling faint or lightheaded, falls, breathing problems -sudden chest pain -swelling, pain or tenderness in your calf or leg -unusual bruising or bleeding -vaginal discharge that is bloody, brown, or rust -weakness -yellowing of the whites of the eyes or skin Side effects that usually do not require medical attention (report to your doctor or health care professional if they continue or are bothersome): -fatigue -hair loss, although uncommon and is usually mild -headache -hot flashes -impotence (in men) -nausea, vomiting (mild) -vaginal discharge (white or clear) This list may not describe all possible side effects. Call your doctor  for medical advice about side effects. You may report side effects to FDA at 1-800-FDA-1088. Where should I keep my medicine? Keep out of the reach of children. Store at room temperature between 20 and 25 degrees C (68 and 77 degrees F). Protect from light. Keep container tightly closed. Throw away any unused medicine after the expiration date. NOTE: This sheet is a summary. It may not cover all possible information. If you have questions about this medicine, talk to your doctor, pharmacist, or health care provider.  2019 Elsevier/Gold Standard (2017-09-18 16:49:57)

## 2018-10-17 ENCOUNTER — Ambulatory Visit: Payer: Medicare HMO

## 2018-10-17 ENCOUNTER — Other Ambulatory Visit: Payer: Medicare HMO

## 2018-10-17 ENCOUNTER — Ambulatory Visit
Admission: RE | Admit: 2018-10-17 | Discharge: 2018-10-17 | Disposition: A | Discharge status: Home / Self Care | Payer: Medicare HMO | Source: Ambulatory Visit | Attending: Radiation Oncology | Admitting: Radiation Oncology

## 2018-10-17 ENCOUNTER — Other Ambulatory Visit: Payer: Self-pay

## 2018-10-17 ENCOUNTER — Ambulatory Visit: Payer: Medicare HMO | Admitting: Hematology and Oncology

## 2018-10-17 DIAGNOSIS — C50411 Malignant neoplasm of upper-outer quadrant of right female breast: Secondary | ICD-10-CM | POA: Diagnosis not present

## 2018-10-17 DIAGNOSIS — Z170 Estrogen receptor positive status [ER+]: Secondary | ICD-10-CM | POA: Diagnosis not present

## 2018-10-17 DIAGNOSIS — C50919 Malignant neoplasm of unspecified site of unspecified female breast: Secondary | ICD-10-CM | POA: Diagnosis not present

## 2018-10-17 LAB — CANCER ANTIGEN 27.29: CA 27.29: <3.5 U/mL (ref 0.0–38.6)

## 2018-10-20 ENCOUNTER — Ambulatory Visit
Admission: RE | Admit: 2018-10-20 | Discharge: 2018-10-20 | Disposition: A | Discharge status: Home / Self Care | Payer: Medicare HMO | Source: Ambulatory Visit | Attending: Radiation Oncology | Admitting: Radiation Oncology

## 2018-10-20 ENCOUNTER — Ambulatory Visit: Payer: Medicare HMO

## 2018-10-20 ENCOUNTER — Other Ambulatory Visit: Payer: Self-pay | Admitting: Internal Medicine

## 2018-10-20 ENCOUNTER — Other Ambulatory Visit: Payer: Self-pay

## 2018-10-20 DIAGNOSIS — C50919 Malignant neoplasm of unspecified site of unspecified female breast: Secondary | ICD-10-CM | POA: Diagnosis not present

## 2018-10-20 DIAGNOSIS — C50411 Malignant neoplasm of upper-outer quadrant of right female breast: Secondary | ICD-10-CM | POA: Diagnosis not present

## 2018-10-20 DIAGNOSIS — Z170 Estrogen receptor positive status [ER+]: Secondary | ICD-10-CM | POA: Diagnosis not present

## 2018-10-20 NOTE — Telephone Encounter (Signed)
  Please call patient and make sure her PCP or cardiologist is refilling her BP medication.  M

## 2018-10-20 NOTE — Telephone Encounter (Signed)
Patient currently follows up with Dr. Mike Gip.

## 2018-10-21 ENCOUNTER — Other Ambulatory Visit: Payer: Self-pay

## 2018-10-21 ENCOUNTER — Ambulatory Visit
Admission: RE | Admit: 2018-10-21 | Discharge: 2018-10-21 | Disposition: A | Discharge status: Home / Self Care | Payer: Medicare HMO | Source: Ambulatory Visit | Attending: Radiation Oncology | Admitting: Radiation Oncology

## 2018-10-21 ENCOUNTER — Ambulatory Visit: Payer: Medicare HMO

## 2018-10-21 DIAGNOSIS — C50411 Malignant neoplasm of upper-outer quadrant of right female breast: Secondary | ICD-10-CM | POA: Diagnosis not present

## 2018-10-21 DIAGNOSIS — C50919 Malignant neoplasm of unspecified site of unspecified female breast: Secondary | ICD-10-CM | POA: Diagnosis not present

## 2018-10-21 DIAGNOSIS — Z170 Estrogen receptor positive status [ER+]: Secondary | ICD-10-CM | POA: Diagnosis not present

## 2018-10-22 ENCOUNTER — Other Ambulatory Visit: Payer: Self-pay

## 2018-10-22 ENCOUNTER — Ambulatory Visit
Admission: RE | Admit: 2018-10-22 | Discharge: 2018-10-22 | Disposition: A | Discharge status: Home / Self Care | Payer: Medicare HMO | Source: Ambulatory Visit | Attending: Radiation Oncology | Admitting: Radiation Oncology

## 2018-10-22 ENCOUNTER — Ambulatory Visit: Payer: Medicare HMO

## 2018-10-22 DIAGNOSIS — Z170 Estrogen receptor positive status [ER+]: Secondary | ICD-10-CM | POA: Diagnosis not present

## 2018-10-22 DIAGNOSIS — C50411 Malignant neoplasm of upper-outer quadrant of right female breast: Secondary | ICD-10-CM | POA: Diagnosis not present

## 2018-10-22 DIAGNOSIS — C50919 Malignant neoplasm of unspecified site of unspecified female breast: Secondary | ICD-10-CM | POA: Diagnosis not present

## 2018-10-23 ENCOUNTER — Ambulatory Visit
Admission: RE | Admit: 2018-10-23 | Discharge: 2018-10-23 | Disposition: A | Discharge status: Home / Self Care | Payer: Medicare HMO | Source: Ambulatory Visit | Attending: Radiation Oncology | Admitting: Radiation Oncology

## 2018-10-23 ENCOUNTER — Other Ambulatory Visit: Payer: Self-pay

## 2018-10-23 ENCOUNTER — Other Ambulatory Visit: Payer: Self-pay | Admitting: Hematology and Oncology

## 2018-10-23 ENCOUNTER — Inpatient Hospital Stay: Payer: Medicare HMO

## 2018-10-23 VITALS — BP 137/70 | HR 63 | Temp 96.5°F | Resp 18

## 2018-10-23 DIAGNOSIS — C50411 Malignant neoplasm of upper-outer quadrant of right female breast: Secondary | ICD-10-CM | POA: Diagnosis not present

## 2018-10-23 DIAGNOSIS — Z170 Estrogen receptor positive status [ER+]: Secondary | ICD-10-CM

## 2018-10-23 DIAGNOSIS — T451X5A Adverse effect of antineoplastic and immunosuppressive drugs, initial encounter: Secondary | ICD-10-CM | POA: Diagnosis not present

## 2018-10-23 DIAGNOSIS — Z5112 Encounter for antineoplastic immunotherapy: Secondary | ICD-10-CM | POA: Diagnosis not present

## 2018-10-23 DIAGNOSIS — L298 Other pruritus: Secondary | ICD-10-CM | POA: Diagnosis not present

## 2018-10-23 DIAGNOSIS — C50919 Malignant neoplasm of unspecified site of unspecified female breast: Secondary | ICD-10-CM | POA: Diagnosis not present

## 2018-10-23 DIAGNOSIS — L599 Disorder of the skin and subcutaneous tissue related to radiation, unspecified: Secondary | ICD-10-CM | POA: Diagnosis not present

## 2018-10-23 DIAGNOSIS — R600 Localized edema: Secondary | ICD-10-CM | POA: Diagnosis not present

## 2018-10-23 DIAGNOSIS — Z8543 Personal history of malignant neoplasm of ovary: Secondary | ICD-10-CM | POA: Diagnosis not present

## 2018-10-23 DIAGNOSIS — M810 Age-related osteoporosis without current pathological fracture: Secondary | ICD-10-CM | POA: Diagnosis not present

## 2018-10-23 DIAGNOSIS — G620 Drug-induced polyneuropathy: Secondary | ICD-10-CM | POA: Diagnosis not present

## 2018-10-23 MED ORDER — HEPARIN SOD (PORK) LOCK FLUSH 100 UNIT/ML IV SOLN
500.0000 [IU] | Freq: Once | INTRAVENOUS | Status: AC
  Administered 2018-10-23: 500 [IU] via INTRAVENOUS
  Filled 2018-10-23: qty 5

## 2018-10-23 MED ORDER — DIPHENHYDRAMINE HCL 25 MG PO CAPS
25.0000 mg | ORAL_CAPSULE | Freq: Once | ORAL | Status: AC
  Administered 2018-10-23: 25 mg via ORAL
  Filled 2018-10-23: qty 1

## 2018-10-23 MED ORDER — SODIUM CHLORIDE 0.9 % IV SOLN
Freq: Once | INTRAVENOUS | Status: AC
  Administered 2018-10-23: 14:00:00 via INTRAVENOUS
  Filled 2018-10-23: qty 250

## 2018-10-23 MED ORDER — TRASTUZUMAB CHEMO 150 MG IV SOLR
150.0000 mg | Freq: Once | INTRAVENOUS | Status: AC
  Administered 2018-10-23: 150 mg via INTRAVENOUS
  Filled 2018-10-23: qty 7.1

## 2018-10-23 MED ORDER — ACETAMINOPHEN 325 MG PO TABS
650.0000 mg | ORAL_TABLET | Freq: Once | ORAL | Status: AC
  Administered 2018-10-23: 14:00:00 650 mg via ORAL
  Filled 2018-10-23: qty 2

## 2018-10-23 MED ORDER — SODIUM CHLORIDE 0.9% FLUSH
10.0000 mL | INTRAVENOUS | Status: DC | PRN
  Administered 2018-10-23: 10 mL via INTRAVENOUS
  Filled 2018-10-23: qty 10

## 2018-10-24 ENCOUNTER — Ambulatory Visit
Admission: RE | Admit: 2018-10-24 | Discharge: 2018-10-24 | Disposition: A | Discharge status: Home / Self Care | Payer: Medicare HMO | Source: Ambulatory Visit | Attending: Radiation Oncology | Admitting: Radiation Oncology

## 2018-10-24 ENCOUNTER — Other Ambulatory Visit: Payer: Self-pay

## 2018-10-24 DIAGNOSIS — C50411 Malignant neoplasm of upper-outer quadrant of right female breast: Secondary | ICD-10-CM | POA: Diagnosis not present

## 2018-10-24 DIAGNOSIS — Z170 Estrogen receptor positive status [ER+]: Secondary | ICD-10-CM | POA: Diagnosis not present

## 2018-10-24 DIAGNOSIS — C50919 Malignant neoplasm of unspecified site of unspecified female breast: Secondary | ICD-10-CM | POA: Diagnosis not present

## 2018-10-27 ENCOUNTER — Ambulatory Visit
Admission: RE | Admit: 2018-10-27 | Discharge: 2018-10-27 | Disposition: A | Discharge status: Home / Self Care | Payer: Medicare HMO | Source: Ambulatory Visit | Attending: Radiation Oncology | Admitting: Radiation Oncology

## 2018-10-27 ENCOUNTER — Other Ambulatory Visit: Payer: Self-pay

## 2018-10-27 DIAGNOSIS — C50919 Malignant neoplasm of unspecified site of unspecified female breast: Secondary | ICD-10-CM | POA: Diagnosis not present

## 2018-10-28 ENCOUNTER — Ambulatory Visit
Admission: RE | Admit: 2018-10-28 | Discharge: 2018-10-28 | Disposition: A | Discharge status: Home / Self Care | Payer: Medicare HMO | Source: Ambulatory Visit | Attending: Radiation Oncology | Admitting: Radiation Oncology

## 2018-10-28 DIAGNOSIS — C50919 Malignant neoplasm of unspecified site of unspecified female breast: Secondary | ICD-10-CM | POA: Diagnosis not present

## 2018-10-29 ENCOUNTER — Other Ambulatory Visit: Payer: Self-pay

## 2018-10-29 ENCOUNTER — Ambulatory Visit
Admission: RE | Admit: 2018-10-29 | Discharge: 2018-10-29 | Disposition: A | Discharge status: Home / Self Care | Payer: Medicare HMO | Source: Ambulatory Visit | Attending: Radiation Oncology | Admitting: Radiation Oncology

## 2018-10-29 DIAGNOSIS — C50919 Malignant neoplasm of unspecified site of unspecified female breast: Secondary | ICD-10-CM | POA: Diagnosis not present

## 2018-10-30 ENCOUNTER — Ambulatory Visit
Admission: RE | Admit: 2018-10-30 | Discharge: 2018-10-30 | Disposition: A | Discharge status: Home / Self Care | Payer: Medicare HMO | Source: Ambulatory Visit | Attending: Radiation Oncology | Admitting: Radiation Oncology

## 2018-10-30 ENCOUNTER — Inpatient Hospital Stay: Payer: Medicare HMO

## 2018-10-30 ENCOUNTER — Other Ambulatory Visit: Payer: Self-pay

## 2018-10-30 ENCOUNTER — Other Ambulatory Visit: Payer: Self-pay | Admitting: Hematology and Oncology

## 2018-10-30 VITALS — BP 146/73 | HR 63 | Temp 97.4°F | Resp 18

## 2018-10-30 DIAGNOSIS — L599 Disorder of the skin and subcutaneous tissue related to radiation, unspecified: Secondary | ICD-10-CM | POA: Diagnosis not present

## 2018-10-30 DIAGNOSIS — M810 Age-related osteoporosis without current pathological fracture: Secondary | ICD-10-CM | POA: Diagnosis not present

## 2018-10-30 DIAGNOSIS — G620 Drug-induced polyneuropathy: Secondary | ICD-10-CM | POA: Diagnosis not present

## 2018-10-30 DIAGNOSIS — Z5112 Encounter for antineoplastic immunotherapy: Secondary | ICD-10-CM | POA: Diagnosis not present

## 2018-10-30 DIAGNOSIS — Z170 Estrogen receptor positive status [ER+]: Secondary | ICD-10-CM | POA: Diagnosis not present

## 2018-10-30 DIAGNOSIS — C50411 Malignant neoplasm of upper-outer quadrant of right female breast: Secondary | ICD-10-CM

## 2018-10-30 DIAGNOSIS — L298 Other pruritus: Secondary | ICD-10-CM | POA: Diagnosis not present

## 2018-10-30 DIAGNOSIS — R600 Localized edema: Secondary | ICD-10-CM | POA: Diagnosis not present

## 2018-10-30 DIAGNOSIS — T451X5A Adverse effect of antineoplastic and immunosuppressive drugs, initial encounter: Secondary | ICD-10-CM | POA: Diagnosis not present

## 2018-10-30 DIAGNOSIS — Z8543 Personal history of malignant neoplasm of ovary: Secondary | ICD-10-CM | POA: Diagnosis not present

## 2018-10-30 DIAGNOSIS — C50919 Malignant neoplasm of unspecified site of unspecified female breast: Secondary | ICD-10-CM | POA: Diagnosis not present

## 2018-10-30 MED ORDER — TRASTUZUMAB CHEMO 150 MG IV SOLR
150.0000 mg | Freq: Once | INTRAVENOUS | Status: AC
  Administered 2018-10-30: 150 mg via INTRAVENOUS
  Filled 2018-10-30: qty 7.14

## 2018-10-30 MED ORDER — DIPHENHYDRAMINE HCL 25 MG PO CAPS
25.0000 mg | ORAL_CAPSULE | Freq: Once | ORAL | Status: AC
  Administered 2018-10-30: 25 mg via ORAL

## 2018-10-30 MED ORDER — SODIUM CHLORIDE 0.9 % IV SOLN
Freq: Once | INTRAVENOUS | Status: AC
  Administered 2018-10-30: 14:00:00 via INTRAVENOUS
  Filled 2018-10-30: qty 250

## 2018-10-30 MED ORDER — HEPARIN SOD (PORK) LOCK FLUSH 100 UNIT/ML IV SOLN
500.0000 [IU] | Freq: Once | INTRAVENOUS | Status: AC | PRN
  Administered 2018-10-30: 15:00:00 500 [IU]
  Filled 2018-10-30: qty 5

## 2018-10-30 MED ORDER — ACETAMINOPHEN 325 MG PO TABS
650.0000 mg | ORAL_TABLET | Freq: Once | ORAL | Status: AC
  Administered 2018-10-30: 650 mg via ORAL

## 2018-10-30 MED ORDER — SODIUM CHLORIDE 0.9% FLUSH
10.0000 mL | INTRAVENOUS | Status: DC | PRN
  Administered 2018-10-30: 10 mL
  Filled 2018-10-30: qty 10

## 2018-10-31 ENCOUNTER — Ambulatory Visit
Admission: RE | Admit: 2018-10-31 | Discharge: 2018-10-31 | Disposition: A | Discharge status: Home / Self Care | Payer: Medicare HMO | Source: Ambulatory Visit | Attending: Radiation Oncology | Admitting: Radiation Oncology

## 2018-10-31 ENCOUNTER — Other Ambulatory Visit: Payer: Self-pay

## 2018-10-31 DIAGNOSIS — C50919 Malignant neoplasm of unspecified site of unspecified female breast: Secondary | ICD-10-CM | POA: Diagnosis not present

## 2018-10-31 DIAGNOSIS — Z170 Estrogen receptor positive status [ER+]: Secondary | ICD-10-CM | POA: Diagnosis not present

## 2018-10-31 DIAGNOSIS — C50411 Malignant neoplasm of upper-outer quadrant of right female breast: Secondary | ICD-10-CM | POA: Diagnosis not present

## 2018-11-04 ENCOUNTER — Other Ambulatory Visit: Payer: Self-pay

## 2018-11-04 ENCOUNTER — Ambulatory Visit: Admission: RE | Admit: 2018-11-04 | Payer: Medicare HMO | Source: Ambulatory Visit

## 2018-11-05 ENCOUNTER — Ambulatory Visit
Admission: RE | Admit: 2018-11-05 | Discharge: 2018-11-05 | Disposition: A | Discharge status: Home / Self Care | Payer: Medicare HMO | Source: Ambulatory Visit | Attending: Radiation Oncology | Admitting: Radiation Oncology

## 2018-11-05 ENCOUNTER — Other Ambulatory Visit: Payer: Self-pay

## 2018-11-05 DIAGNOSIS — C50919 Malignant neoplasm of unspecified site of unspecified female breast: Secondary | ICD-10-CM | POA: Diagnosis not present

## 2018-11-06 ENCOUNTER — Other Ambulatory Visit: Payer: Self-pay | Admitting: Hematology and Oncology

## 2018-11-06 ENCOUNTER — Inpatient Hospital Stay: Payer: Medicare HMO

## 2018-11-06 VITALS — BP 161/80 | HR 66 | Temp 96.9°F | Resp 18

## 2018-11-06 DIAGNOSIS — C50411 Malignant neoplasm of upper-outer quadrant of right female breast: Secondary | ICD-10-CM

## 2018-11-06 DIAGNOSIS — T451X5A Adverse effect of antineoplastic and immunosuppressive drugs, initial encounter: Secondary | ICD-10-CM | POA: Diagnosis not present

## 2018-11-06 DIAGNOSIS — Z8543 Personal history of malignant neoplasm of ovary: Secondary | ICD-10-CM | POA: Diagnosis not present

## 2018-11-06 DIAGNOSIS — L599 Disorder of the skin and subcutaneous tissue related to radiation, unspecified: Secondary | ICD-10-CM | POA: Diagnosis not present

## 2018-11-06 DIAGNOSIS — L298 Other pruritus: Secondary | ICD-10-CM | POA: Diagnosis not present

## 2018-11-06 DIAGNOSIS — Z170 Estrogen receptor positive status [ER+]: Secondary | ICD-10-CM | POA: Diagnosis not present

## 2018-11-06 DIAGNOSIS — R600 Localized edema: Secondary | ICD-10-CM | POA: Diagnosis not present

## 2018-11-06 DIAGNOSIS — G620 Drug-induced polyneuropathy: Secondary | ICD-10-CM | POA: Diagnosis not present

## 2018-11-06 DIAGNOSIS — Z5112 Encounter for antineoplastic immunotherapy: Secondary | ICD-10-CM | POA: Diagnosis not present

## 2018-11-06 DIAGNOSIS — M810 Age-related osteoporosis without current pathological fracture: Secondary | ICD-10-CM | POA: Diagnosis not present

## 2018-11-06 MED ORDER — SODIUM CHLORIDE 0.9% FLUSH
10.0000 mL | INTRAVENOUS | Status: DC | PRN
  Administered 2018-11-06: 10 mL
  Filled 2018-11-06: qty 10

## 2018-11-06 MED ORDER — ACETAMINOPHEN 325 MG PO TABS
650.0000 mg | ORAL_TABLET | Freq: Once | ORAL | Status: AC
  Administered 2018-11-06: 650 mg via ORAL
  Filled 2018-11-06: qty 2

## 2018-11-06 MED ORDER — TRASTUZUMAB CHEMO 150 MG IV SOLR
150.0000 mg | Freq: Once | INTRAVENOUS | Status: AC
  Administered 2018-11-06: 150 mg via INTRAVENOUS
  Filled 2018-11-06: qty 7.14

## 2018-11-06 MED ORDER — SODIUM CHLORIDE 0.9 % IV SOLN
Freq: Once | INTRAVENOUS | Status: AC
  Administered 2018-11-06: 14:00:00 via INTRAVENOUS
  Filled 2018-11-06: qty 250

## 2018-11-06 MED ORDER — HEPARIN SOD (PORK) LOCK FLUSH 100 UNIT/ML IV SOLN
500.0000 [IU] | Freq: Once | INTRAVENOUS | Status: AC | PRN
  Administered 2018-11-06: 500 [IU]
  Filled 2018-11-06: qty 5

## 2018-11-06 MED ORDER — DIPHENHYDRAMINE HCL 25 MG PO CAPS
25.0000 mg | ORAL_CAPSULE | Freq: Once | ORAL | Status: AC
  Administered 2018-11-06: 25 mg via ORAL
  Filled 2018-11-06: qty 1

## 2018-11-12 ENCOUNTER — Other Ambulatory Visit: Payer: Self-pay

## 2018-11-12 ENCOUNTER — Ambulatory Visit
Admission: RE | Admit: 2018-11-12 | Discharge: 2018-11-12 | Disposition: A | Discharge status: Home / Self Care | Payer: Medicare HMO | Source: Ambulatory Visit | Attending: Hematology and Oncology | Admitting: Hematology and Oncology

## 2018-11-12 ENCOUNTER — Telehealth: Payer: Self-pay

## 2018-11-12 DIAGNOSIS — E785 Hyperlipidemia, unspecified: Secondary | ICD-10-CM | POA: Diagnosis not present

## 2018-11-12 DIAGNOSIS — I080 Rheumatic disorders of both mitral and aortic valves: Secondary | ICD-10-CM | POA: Diagnosis not present

## 2018-11-12 DIAGNOSIS — C50411 Malignant neoplasm of upper-outer quadrant of right female breast: Secondary | ICD-10-CM | POA: Insufficient documentation

## 2018-11-12 DIAGNOSIS — Z170 Estrogen receptor positive status [ER+]: Secondary | ICD-10-CM | POA: Diagnosis not present

## 2018-11-12 DIAGNOSIS — I10 Essential (primary) hypertension: Secondary | ICD-10-CM | POA: Diagnosis not present

## 2018-11-12 NOTE — Progress Notes (Signed)
*  PRELIMINARY RESULTS* Echocardiogram 2D Echocardiogram has been performed.  [name] [name] 11/12/2018, 10:43 AM

## 2018-11-12 NOTE — Telephone Encounter (Signed)
Left a message to inform the patient that her EF has decreased and hold on the the Herceptin for now, No infusion tomorrow. f/u next week.I have instructed the patient to contact the office at [phone].

## 2018-11-12 NOTE — Telephone Encounter (Signed)
-----   Message from Lequita Asal, MD sent at 11/12/2018  2:46 PM EDT ----- Regarding: Please call patient  EF has decreased.  Hold Herceptin for now.  No infusion tomorrow.  Follow-up with me next week.  M ----- Message ----- From: Interface, [employer] Sent: 11/12/2018   1:10 PM EDT To: Lequita Asal, MD

## 2018-11-13 ENCOUNTER — Other Ambulatory Visit: Payer: Medicare HMO

## 2018-11-13 ENCOUNTER — Ambulatory Visit: Payer: Medicare HMO

## 2018-11-13 ENCOUNTER — Other Ambulatory Visit: Payer: Self-pay

## 2018-11-13 ENCOUNTER — Ambulatory Visit (INDEPENDENT_AMBULATORY_CARE_PROVIDER_SITE_OTHER): Payer: Medicare HMO | Admitting: Internal Medicine

## 2018-11-13 ENCOUNTER — Encounter: Payer: Self-pay | Admitting: Internal Medicine

## 2018-11-13 ENCOUNTER — Ambulatory Visit: Payer: Medicare HMO | Admitting: Hematology and Oncology

## 2018-11-13 VITALS — BP 130/68 | HR 75 | Temp 98.1°F | Resp 16 | Ht 63.0 in | Wt 160.6 lb

## 2018-11-13 DIAGNOSIS — Z170 Estrogen receptor positive status [ER+]: Secondary | ICD-10-CM | POA: Diagnosis not present

## 2018-11-13 DIAGNOSIS — C50411 Malignant neoplasm of upper-outer quadrant of right female breast: Secondary | ICD-10-CM

## 2018-11-13 DIAGNOSIS — E7800 Pure hypercholesterolemia, unspecified: Secondary | ICD-10-CM

## 2018-11-13 DIAGNOSIS — F439 Reaction to severe stress, unspecified: Secondary | ICD-10-CM

## 2018-11-13 DIAGNOSIS — Z0000 Encounter for general adult medical examination without abnormal findings: Secondary | ICD-10-CM | POA: Diagnosis not present

## 2018-11-13 DIAGNOSIS — R69 Illness, unspecified: Secondary | ICD-10-CM | POA: Diagnosis not present

## 2018-11-13 NOTE — Progress Notes (Signed)
Patient ID: [name] [name], female   DOB: January 25, 1934, 85 y.o.   MRN: [id number]   Subjective:    Patient ID: [name] [name], female    DOB: March 05, 1934, 85 y.o.   MRN: [id number]  HPI  Patient here her physical exam.  She reports she is doing relatively well.  Receiving treatment for breast cancer.  S/p XRT.  Has been receiving herceptin.  Just had echo.  EF 45-50% - mildly reduced.  Instructed to hold on herceptin given reduction in EF.  She feels she is tolerating well.  Some fatigue, but overall feels ok. No chest pain.  No sob.  No acid reflux.  No abdominal pain.  Bowels moving.  Eating.  No nausea or vomiting.  Handling stress.    Past Medical History:  Diagnosis Date  . Diverticulitis of sigmoid colon   . Endometriosis   . Fibrocystic breast disease   . Glaucoma   . H/O urinary incontinence   . History of chicken pox   . History of colon polyps 2014  . Hyperlipidemia   . Hypertension   . Ovarian cancer ([place]) 2002   Chemo tx's @ [facility] with Total Hysterectomy. Dr Amalia Hailey  . Personal history of chemotherapy    ovarian cancer  . Port-A-Cath in place   . Sinusitis   . Tuberculosis    positve skin test   Past Surgical History:  Procedure Laterality Date  . ABDOMINAL HYSTERECTOMY  2003   complete Dr Amalia Hailey  . ABSCESS DRAINAGE  2017   right buttock at [facility]  . APPENDECTOMY  2003  . BREAST EXCISIONAL BIOPSY Left 1993   excisional bx. negative results  . BREAST SURGERY Left 1982   papilloma removal  . COLONOSCOPY  2006, 2014   Dr Vira Agar   . COLONOSCOPY N/A 03/27/2016   Procedure: COLONOSCOPY;  Surgeon: Robert Bellow, MD;  Location: [facility] ENDOSCOPY;  Service: Endoscopy;  Laterality: N/A;  . COLOSTOMY REVERSAL  2014   at [place] Dr Raynaldo Opitz  . NASAL SINUS SURGERY     For fungal infection  . NASAL SINUS SURGERY  8/05 8/06  . OOPHORECTOMY    . OSTOMY  05/22/2012   diverticulitis with abcess/ Dr Tamala Julian  . ovarian cancer     Exploratory lap  . PORTA CATH INSERTION N/A  05/26/2018   Procedure: PORTA CATH INSERTION;  Surgeon: Algernon Huxley, MD;  Location: [place] [facility];  Service: Cardiovascular;  Laterality: N/A;  . TONSILLECTOMY AND ADENOIDECTOMY  1947   Family History  Problem Relation Age of Onset  . Hypertension Mother   . Coronary artery disease Mother   . Heart disease Mother   . Kidney disease Mother   . Diabetes Mother   . Cancer Mother        uterine cancer  . Diabetes Father   . Kidney failure Father   . Diabetes Brother   . Coronary artery disease Brother   . Cancer Brother        bladder cancer  . Stroke Sister   . Heart disease Sister        [facility]  . Cancer Maternal Aunt        ovarian cancer  . Breast cancer Cousin   . Cancer Brother        Prostate  . Breast cancer Cousin   . Breast cancer Cousin    Social History   Socioeconomic History  . Marital status: Widowed    Spouse name: Not  on file  . Number of children: 0  . Years of education: Not on file  . Highest education level: Not on file  Occupational History  . Not on file  Social Needs  . Financial resource strain: Not hard at all  . Food insecurity:    Worry: Never true    Inability: Never true  . Transportation needs:    Medical: No    Non-medical: No  Tobacco Use  . Smoking status: Never Smoker  . Smokeless tobacco: Never Used  Substance and Sexual Activity  . Alcohol use: No    Alcohol/week: 0.0 standard drinks  . Drug use: No  . Sexual activity: Not Currently  Lifestyle  . Physical activity:    Days per week: Not on file    Minutes per session: Not on file  . Stress: Not on file  Relationships  . Social connections:    Talks on phone: Not on file    Gets together: Not on file    Attends religious service: Not on file    Active member of club or organization: Not on file    Attends meetings of clubs or organizations: Not on file    Relationship status: Not on file  Other Topics Concern  . Not on file  Social History Narrative  .  Not on file    Outpatient Encounter Medications as of 11/13/2018  Medication Sig  . amLODipine (NORVASC) 2.5 MG tablet Take 1 tablet (2.5 mg total) by mouth daily.  . Calcium Carbonate-Vitamin D 600-400 MG-UNIT tablet Take 1 tablet by mouth daily.   Marland Kitchen lidocaine-prilocaine (EMLA) cream Apply 1 application topically as needed.  . Multiple Vitamins-Minerals (MULTIVITAMIN WITH MINERALS) tablet Take 1 tablet by mouth daily.  . prochlorperazine (COMPAZINE) 10 MG tablet Take 1 tablet (10 mg total) by mouth every 6 (six) hours as needed for nausea or vomiting. (Patient not taking: Reported on 10/16/2018)   Facility-Administered Encounter Medications as of 11/13/2018  Medication  . lidocaine-prilocaine (EMLA) cream  . sodium chloride 0.9 % 50 mL with famotidine (PEPCID) 20 mg infusion  . sodium chloride flush (NS) 0.9 % injection 10 mL  . sodium chloride flush (NS) 0.9 % injection 10 mL    Review of Systems  Constitutional: Negative for appetite change and unexpected weight change.  HENT: Negative for congestion and sinus pressure.   Eyes: Negative for pain and visual disturbance.  Respiratory: Negative for cough, chest tightness and shortness of breath.   Cardiovascular: Negative for chest pain, palpitations and leg swelling.  Gastrointestinal: Negative for abdominal pain, diarrhea, nausea and vomiting.  Genitourinary: Negative for difficulty urinating and dysuria.  Musculoskeletal: Negative for joint swelling and myalgias.  Skin: Negative for color change and rash.  Neurological: Negative for dizziness, light-headedness and headaches.  Hematological: Negative for adenopathy. Does not bruise/bleed easily.  Psychiatric/Behavioral: Negative for agitation and dysphoric mood.       Objective:    Physical Exam Constitutional:      General: She is not in acute distress.    Appearance: Normal appearance. She is well-developed.  HENT:     Right Ear: External ear normal. There is no impacted  cerumen.     Left Ear: External ear normal. There is no impacted cerumen.  Eyes:     General: No scleral icterus.       Right eye: No discharge.        Left eye: No discharge.     Conjunctiva/sclera: Conjunctivae normal.  Neck:  Musculoskeletal: Neck supple. No muscular tenderness.     Thyroid: No thyromegaly.  Cardiovascular:     Rate and Rhythm: Normal rate and regular rhythm.  Pulmonary:     Effort: No tachypnea, accessory muscle usage or respiratory distress.     Breath sounds: Normal breath sounds. No decreased breath sounds or wheezing.  Chest:     Breasts:        Right: No inverted nipple, mass, nipple discharge or tenderness (no axillary adenopathy).        Left: No inverted nipple, mass, nipple discharge or tenderness (no axilarry adenopathy).  Abdominal:     General: Bowel sounds are normal.     Palpations: Abdomen is soft.     Tenderness: There is no abdominal tenderness.  Musculoskeletal:        General: No swelling or tenderness.  Lymphadenopathy:     Cervical: No cervical adenopathy.  Skin:    General: Skin is warm.     Findings: No erythema or rash.  Neurological:     Mental Status: She is alert and oriented to person, place, and time.  Psychiatric:        Mood and Affect: Mood normal.        Behavior: Behavior normal.     BP 130/68   Pulse 75   Temp 98.1 F (36.7 C) (Oral)   Resp 16   Ht 5\' 3"  (1.6 m)   Wt 160 lb 9.6 oz (72.8 kg)   SpO2 97%   BMI 28.45 kg/m  Wt Readings from Last 3 Encounters:  11/13/18 160 lb 9.6 oz (72.8 kg)  10/16/18 160 lb 7.9 oz (72.8 kg)  09/05/18 166 lb 3.6 oz (75.4 kg)     Lab Results  Component Value Date   WBC 7.0 10/16/2018   HGB 12.2 10/16/2018   HCT 37.5 10/16/2018   PLT 233 10/16/2018   GLUCOSE 108 (H) 10/16/2018   CHOL 160 01/21/2018   TRIG 170.0 (H) 01/21/2018   HDL 52.70 01/21/2018   LDLCALC 74 01/21/2018   ALT 19 10/16/2018   AST 24 10/16/2018   NA 139 10/16/2018   K 3.5 10/16/2018   CL 103  10/16/2018   CREATININE 0.58 10/16/2018   BUN 16 10/16/2018   CO2 27 10/16/2018   TSH 1.79 01/21/2018   INR 1.0 05/21/2012       Assessment & Plan:   Problem List Items Addressed This Visit    Carcinoma of upper-outer quadrant of right breast in female, estrogen receptor positive ([place])    Completed XRT.  Receiving herceptin.  ECHO yesterday revealed EF 45-50%.  Decreased.  herceptin on hold.  Has f/u planned with oncology.        Health care maintenance    Physical today 11/12/18.  Colonoscopy 03/27/16.  Being followed by oncology for breast cancer.        Hypercholesterolemia    Low cholesterold eit and exercise.  Follow lipid panel.        Stress    Handling stress.  Follow.         Other Visit Diagnoses    Routine general medical examination at a health care facility    -  Primary       Einar Pheasant, MD

## 2018-11-16 ENCOUNTER — Encounter: Payer: Self-pay | Admitting: Internal Medicine

## 2018-11-16 NOTE — Assessment & Plan Note (Signed)
Completed XRT.  Receiving herceptin.  ECHO yesterday revealed EF 45-50%.  Decreased.  herceptin on hold.  Has f/u planned with oncology.

## 2018-11-16 NOTE — Assessment & Plan Note (Signed)
Handling stress.  Follow.   

## 2018-11-16 NOTE — Assessment & Plan Note (Signed)
Physical today 11/12/18.  Colonoscopy 03/27/16.  Being followed by oncology for breast cancer.

## 2018-11-16 NOTE — Assessment & Plan Note (Signed)
Low cholesterold eit and exercise.  Follow lipid panel.

## 2018-11-18 ENCOUNTER — Other Ambulatory Visit: Payer: Self-pay

## 2018-11-18 ENCOUNTER — Ambulatory Visit: Payer: Medicare HMO

## 2018-11-19 ENCOUNTER — Other Ambulatory Visit: Payer: Self-pay

## 2018-11-19 ENCOUNTER — Ambulatory Visit (INDEPENDENT_AMBULATORY_CARE_PROVIDER_SITE_OTHER): Payer: Medicare HMO

## 2018-11-19 DIAGNOSIS — Z0000 Encounter for general adult medical examination without abnormal findings: Secondary | ICD-10-CM | POA: Diagnosis not present

## 2018-11-19 DIAGNOSIS — Z7189 Other specified counseling: Secondary | ICD-10-CM | POA: Insufficient documentation

## 2018-11-19 DIAGNOSIS — R931 Abnormal findings on diagnostic imaging of heart and coronary circulation: Secondary | ICD-10-CM | POA: Insufficient documentation

## 2018-11-19 NOTE — Progress Notes (Signed)
Subjective:   [name] [name] is a 85 y.o. female who presents for Medicare Annual (Subsequent) preventive examination.  Review of Systems:  No ROS.  Medicare Wellness Virtual Visit.  Visual/audio telehealth visit, UTA vital signs.   See social history for additional risk factors.   Cardiac Risk Factors include: advanced age (>33men, >48 women)     Objective:     Vitals: There were no vitals taken for this visit.  There is no height or weight on file to calculate BMI.  Advanced Directives 11/19/2018 10/16/2018 09/05/2018 09/02/2018 08/26/2018 08/05/2018 07/29/2018  Does Patient Have a Medical Advance Directive? No Yes Yes No No No No  Type of Advance Directive - [place];Living will [place];Living will - - - -  Does patient want to make changes to medical advance directive? No - Patient declined - No - Patient declined No - Patient declined No - Patient declined No - Patient declined -  Copy of [place] in Chart? - No - copy requested Yes - validated most recent copy scanned in chart (See row information) - - - -  Would patient like information on creating a medical advance directive? - - No - Patient declined No - Patient declined - No - Patient declined No - Patient declined    Tobacco Social History   Tobacco Use  Smoking Status Never Smoker  Smokeless Tobacco Never Used     Counseling given: Not Answered   Clinical Intake:  Pre-visit preparation completed: Yes        Diabetes: No  How often do you need to have someone help you when you read instructions, pamphlets, or other written materials from your doctor or pharmacy?: 1 - Never  Interpreter Needed?: No     Past Medical History:  Diagnosis Date  . Diverticulitis of sigmoid colon   . Endometriosis   . Fibrocystic breast disease   . Glaucoma   . H/O urinary incontinence   . History of chicken pox   . History of colon polyps 2014  .  Hyperlipidemia   . Hypertension   . Ovarian cancer ([place]) 2002   Chemo tx's @ [facility] with Total Hysterectomy. Dr Amalia Hailey  . Personal history of chemotherapy    ovarian cancer  . Port-A-Cath in place   . Sinusitis   . Tuberculosis    positve skin test   Past Surgical History:  Procedure Laterality Date  . ABDOMINAL HYSTERECTOMY  2003   complete Dr Amalia Hailey  . ABSCESS DRAINAGE  2017   right buttock at [facility]  . APPENDECTOMY  2003  . BREAST EXCISIONAL BIOPSY Left 1993   excisional bx. negative results  . BREAST SURGERY Left 1982   papilloma removal  . COLONOSCOPY  2006, 2014   Dr Vira Agar   . COLONOSCOPY N/A 03/27/2016   Procedure: COLONOSCOPY;  Surgeon: Robert Bellow, MD;  Location[facility] ENDOSCOPY;  Service: Endoscopy;  Laterality: N/A;  . COLOSTOMY REVERSAL  2014   at [place] Dr Raynaldo Opitz  . NASAL SINUS SURGERY     For fungal infection  . NASAL SINUS SURGERY  8/05 8/06  . OOPHORECTOMY    . OSTOMY  05/22/2012   diverticulitis with abcess/ Dr Tamala Julian  . ovarian cancer     Exploratory lap  . PORTA CATH INSERTION N/A 05/26/2018   Procedure: PORTA CATH INSERTION;  Surgeon: Algernon Huxley, MD;  Location: [place] [facility];  Service: Cardiovascular;  Laterality: N/A;  .  TONSILLECTOMY AND ADENOIDECTOMY  1947   Family History  Problem Relation Age of Onset  . Hypertension Mother   . Coronary artery disease Mother   . Heart disease Mother   . Kidney disease Mother   . Diabetes Mother   . Cancer Mother        uterine cancer  . Diabetes Father   . Kidney failure Father   . Diabetes Brother   . Coronary artery disease Brother   . Cancer Brother        bladder cancer  . Stroke Sister   . Heart disease Sister        [facility]  . Cancer Maternal Aunt        ovarian cancer  . Breast cancer Cousin   . Cancer Brother        Prostate  . Breast cancer Cousin   . Breast cancer Cousin    Social History   Socioeconomic History  . Marital status: Widowed    Spouse name: Not on  file  . Number of children: 0  . Years of education: Not on file  . Highest education level: Not on file  Occupational History  . Not on file  Social Needs  . Financial resource strain: Not hard at all  . Food insecurity:    Worry: Never true    Inability: Never true  . Transportation needs:    Medical: No    Non-medical: No  Tobacco Use  . Smoking status: Never Smoker  . Smokeless tobacco: Never Used  Substance and Sexual Activity  . Alcohol use: No    Alcohol/week: 0.0 standard drinks  . Drug use: No  . Sexual activity: Not Currently  Lifestyle  . Physical activity:    Days per week: Not on file    Minutes per session: Not on file  . Stress: Not on file  Relationships  . Social connections:    Talks on phone: Not on file    Gets together: Not on file    Attends religious service: Not on file    Active member of club or organization: Not on file    Attends meetings of clubs or organizations: Not on file    Relationship status: Not on file  Other Topics Concern  . Not on file  Social History Narrative  . Not on file    Outpatient Encounter Medications as of 11/19/2018  Medication Sig  . amLODipine (NORVASC) 2.5 MG tablet Take 1 tablet (2.5 mg total) by mouth daily.  . Calcium Carbonate-Vitamin D 600-400 MG-UNIT tablet Take 1 tablet by mouth daily.   Marland Kitchen lidocaine-prilocaine (EMLA) cream Apply 1 application topically as needed.  . Multiple Vitamins-Minerals (MULTIVITAMIN WITH MINERALS) tablet Take 1 tablet by mouth daily.  . prochlorperazine (COMPAZINE) 10 MG tablet Take 1 tablet (10 mg total) by mouth every 6 (six) hours as needed for nausea or vomiting.   Facility-Administered Encounter Medications as of 11/19/2018  Medication  . lidocaine-prilocaine (EMLA) cream  . sodium chloride 0.9 % 50 mL with famotidine (PEPCID) 20 mg infusion  . sodium chloride flush (NS) 0.9 % injection 10 mL  . sodium chloride flush (NS) 0.9 % injection 10 mL    Activities of Daily  Living In your present state of health, do you have any difficulty performing the following activities: 11/19/2018  Hearing? N  Vision? N  Difficulty concentrating or making decisions? N  Walking or climbing stairs? N  Comment Uses hand railing  Dressing or  bathing? N  Doing errands, shopping? N  Preparing Food and eating ? N  Using the Toilet? N  In the past six months, have you accidently leaked urine? Y  Comment Managed and followed by pcp  Do you have problems with loss of bowel control? N  Managing your Medications? N  Managing your Finances? N  Housekeeping or managing your Housekeeping? N  Some recent data might be hidden    Patient Care Team: Einar Pheasant, MD as PCP - General (Internal Medicine) Caryl Bis Angela Adam, MD as Consulting Physician (Family Medicine) Christene Lye, MD (General Surgery) Einar Pheasant, MD (Internal Medicine) Bary Castilla Forest Gleason, MD (General Surgery) Schnier, Dolores Lory, MD as Consulting Physician (Vascular Surgery)    Assessment:   This is a routine wellness examination for Maykayla.  I connected with patient 11/19/18 at  1:30 PM EDT by a video/audio enabled telemedicine application and verified that I am speaking with the correct person using two identifiers. Patient stated full name and DOB. Patient gave permission to continue with virtual visit. Patient's location was at home and Nurse's location was at [place] office.   Health Screenings  Mammogram - 01/2018 Colonoscopy - 03/2016 Bone Density - 03/2017 Glaucoma -none Hearing -demonstrates normal hearing during visit. Labs followed by pcp Dental- visits every 4 months Vision- visits within the last 12 months.  Social  Alcohol intake - no        Smoking history- never    Smokers in home? none Illicit drug use? none Exercise - walks her dog daily and is active around the home Diet - regular Sexually Active -not currently BMI- discussed the importance of a healthy diet, water  intake and the benefits of aerobic exercise.  Educational material provided.   Safety  Patient feels safe at home- yes Patient does have smoke detectors at home- yes Patient does wear sunscreen or protective clothing when in direct sunlight -yes Patient does wear seat belt when in a moving vehicle -yes Patient drives- yes  [id number] precautions and sickness symptoms discussed.   Activities of Daily Living Patient denies needing assistance with: driving, household chores, feeding themselves, getting from bed to chair, getting to the toilet, bathing/showering, dressing, managing money, or preparing meals.  No new identified risk were noted.    Depression Screen Patient denies losing interest in daily life, feeling hopeless, or crying easily over simple problems.   Medication-taking as directed and without issues.   Fall Screen Patient denies being afraid of falling or falling in the last year.   Memory Screen Patient is alert.  Patient denies difficulty focusing, concentrating or misplacing items. Correctly identified the president of the [place], season and year.  Patient likes to read, plays computer games, and/or work puzzles for brain stimulation.  Immunizations The following Immunizations were discussed: Influenza, shingles, pneumonia, and tetanus.   Other Providers Patient Care Team: Einar Pheasant, MD as PCP - General (Internal Medicine) Leone Haven, MD as Consulting Physician (Family Medicine) Christene Lye, MD (General Surgery) Einar Pheasant, MD (Internal Medicine) Bary Castilla, Forest Gleason, MD (General Surgery) Delana Meyer, Dolores Lory, MD as Consulting Physician (Vascular Surgery)  Exercise Activities and Dietary recommendations Current Exercise Habits: Home exercise routine, Type of exercise: walking, Time (Minutes): 15, Frequency (Times/Week): 7, Weekly Exercise (Minutes/Week): 105, Intensity: Mild  Goals      Patient Stated   . Follow up with Primary Care  Provider (pt-stated)     As needed       Fall Risk Fall  Risk  11/19/2018 08/29/2017 03/06/2017 02/22/2016 02/08/2016  Falls in the past year? 0 No No No No   Depression Screen PHQ 2/9 Scores 11/19/2018 08/29/2017 03/06/2017 03/06/2017  PHQ - 2 Score 0 0 0 0  PHQ- 9 Score - - 4 -     Cognitive Function MMSE - Mini Mental State Exam 08/29/2017 02/08/2016  Orientation to time 5 5  Orientation to Place 5 5  Registration 3 3  Attention/ Calculation 5 5  Recall 1 3  Language- name 2 objects 2 2  Language- repeat 1 1  Language- follow 3 step command 3 3  Language- read & follow direction 1 1  Write a sentence 1 1  Copy design 1 1  Total score 28 30     6CIT Screen 11/19/2018  What Year? 0 points  What month? 0 points  What time? 0 points  Count back from 20 0 points  Months in reverse 0 points  Repeat phrase 0 points  Total Score 0     There is no immunization history on file for this patient.  Screening Tests Health Maintenance  Topic Date Due  . MAMMOGRAM  01/18/2019  . DEXA SCAN  Completed  . INFLUENZA VACCINE  Discontinued  . TETANUS/TDAP  Discontinued  . PNA vac Low Risk Adult  Discontinued      Plan:   End of life planning; Advanced aging; Advanced directives discussed.  No HCPOA/Living Will.  Additional information declined at this time.  I have personally reviewed and noted the following in the patient's chart:   . Medical and social history . Use of alcohol, tobacco or illicit drugs  . Current medications and supplements . Functional ability and status . Nutritional status . Physical activity . Advanced directives . List of other physicians . Hospitalizations, surgeries, and ER visits in previous 12 months . Vitals . Screenings to include cognitive, depression, and falls . Referrals and appointments  In addition, I have reviewed and discussed with patient certain preventive protocols, quality metrics, and best practice recommendations. A written  personalized care plan for preventive services as well as general preventive health recommendations were provided to patient.     Varney Biles, LPN  [id number]/[id number]   Reviewed above information.  Agree with assessment and plan.    Dr Nicki Reaper

## 2018-11-19 NOTE — Progress Notes (Signed)
[facility]  [address] [place], [place] [address] Phone: [phone]  Fax: [phone]   Telephone Visit:  11/20/2018  Referring physician: Einar Pheasant, MD  I connected with [name] [name] on 11/20/2018 at 9:15 AM EDT by telephone conferencing and verified that I was speaking with the correct person using 2 identifiers.  The patient was at home.  I discussed the limitations, risk, security and privacy concerns of performing an evaluation and management service by telephone and  the availability of in person appointments.  I also discussed with the patient that there may be a patient responsible charge related to this service.  The patient expressed understanding and agreed to proceed.   Chief Complaint: [name] [name] is a 85 y.o. female with stage I Her2/neu + breast cancer who is seen for 5 week assessment onHerceptin.   HPI: The patient was last seen in the medical oncology clinic on 10/16/2018.  At that time, she was doing well.  Exam was unremarkable.  CA27.29 was normal. She received Herceptin.  She completed radiation on 11/05/2018.  She continued weekly Herceptin (10/23/2018 - 11/06/2018).  Echo on 11/12/2018 revealed an ejection fraction in the range of 45-50%.  Herceptin was held on 11/13/2018.   She was seen by her PCP Einar Pheasant, MD on 11/13/2018.  Colonoscopy was scheduled for 03/28/2019.   During the interim, the patient feels "fine." The patient reports increased fatigue after radiation.  She denies shortness of breath. She has a chronic cough. The edema in her legs and ankles has improved. When she applies pressure on her knees it becomes painful, but it improves with walking.  Her weight is stable.  She notes that radiation dermatitis has improved, but the skin on her breast is still brown and mildly irritated. Her neuropathy is stable and she is able to walk without issues.    Past Medical History:  Diagnosis Date  .  Diverticulitis of sigmoid colon   . Endometriosis   . Fibrocystic breast disease   . Glaucoma   . H/O urinary incontinence   . History of chicken pox   . History of colon polyps 2014  . Hyperlipidemia   . Hypertension   . Ovarian cancer ([place]) 2002   Chemo tx's @ [facility] with Total Hysterectomy. Dr Amalia Hailey  . Personal history of chemotherapy    ovarian cancer  . Port-A-Cath in place   . Sinusitis   . Tuberculosis    positve skin test    Past Surgical History:  Procedure Laterality Date  . ABDOMINAL HYSTERECTOMY  2003   complete Dr Amalia Hailey  . ABSCESS DRAINAGE  2017   right buttock at [facility]  . APPENDECTOMY  2003  . BREAST EXCISIONAL BIOPSY Left 1993   excisional bx. negative results  . BREAST SURGERY Left 1982   papilloma removal  . COLONOSCOPY  2006, 2014   Dr Vira Agar   . COLONOSCOPY N/A 03/27/2016   Procedure: COLONOSCOPY;  Surgeon: Robert Bellow, MD;  Location: [facility] ENDOSCOPY;  Service: Endoscopy;  Laterality: N/A;  . COLOSTOMY REVERSAL  2014   at [place] Dr Raynaldo Opitz  . NASAL SINUS SURGERY     For fungal infection  . NASAL SINUS SURGERY  8/05 8/06  . OOPHORECTOMY    . OSTOMY  05/22/2012   diverticulitis with abcess/ Dr Tamala Julian  . ovarian cancer     Exploratory lap  . PORTA CATH INSERTION N/A 05/26/2018   Procedure: PORTA CATH INSERTION;  Surgeon:  Algernon Huxley, MD;  Location: [facility];  Service: Cardiovascular;  Laterality: N/A;  . TONSILLECTOMY AND ADENOIDECTOMY  1947    Family History  Problem Relation Age of Onset  . Hypertension Mother   . Coronary artery disease Mother   . Heart disease Mother   . Kidney disease Mother   . Diabetes Mother   . Cancer Mother        uterine cancer  . Diabetes Father   . Kidney failure Father   . Diabetes Brother   . Coronary artery disease Brother   . Cancer Brother        bladder cancer  . Stroke Sister   . Heart disease Sister        [facility]  . Cancer Maternal Aunt        ovarian cancer  . Breast  cancer Cousin   . Cancer Brother        Prostate  . Breast cancer Cousin   . Breast cancer Cousin     Social History:  reports that she has never smoked. She has never used smokeless tobacco. She reports that she does not drink alcohol or use drugs. Patient is a retired Engineer, site. Generally accompanied to appointments by friend, [name]. She lives in [place] patient is alone today.   Participants in the patient's visit and their role in the encounter included the patient, and Vito Berger, CMA, today.  The intake visit was provided by Vito Berger, CMA.   Allergies: No Known Allergies  Current Medications: Current Outpatient Medications  Medication Sig Dispense Refill  . amLODipine (NORVASC) 2.5 MG tablet Take 1 tablet (2.5 mg total) by mouth daily. 30 tablet 2  . Calcium Carbonate-Vitamin D 600-400 MG-UNIT tablet Take 1 tablet by mouth daily.     . Multiple Vitamins-Minerals (MULTIVITAMIN WITH MINERALS) tablet Take 1 tablet by mouth daily.    Marland Kitchen lidocaine-prilocaine (EMLA) cream Apply 1 application topically as needed. (Patient not taking: Reported on 11/20/2018) 30 g 5  . prochlorperazine (COMPAZINE) 10 MG tablet Take 1 tablet (10 mg total) by mouth every 6 (six) hours as needed for nausea or vomiting. (Patient not taking: Reported on 11/20/2018) 30 tablet 3   Current Facility-Administered Medications  Medication Dose Route Frequency Provider Last Rate Last Dose  . lidocaine-prilocaine (EMLA) cream   Topical PRN Lucky Cowboy, Erskine Squibb, MD       Facility-Administered Medications Ordered in Other Visits  Medication Dose Route Frequency Provider Last Rate Last Dose  . sodium chloride 0.9 % 50 mL with famotidine (PEPCID) 20 mg infusion  20 mg Intravenous Once Charlaine Dalton R, MD      . sodium chloride flush (NS) 0.9 % injection 10 mL  10 mL Intravenous PRN Cammie Sickle, MD   10 mL at 06/24/18 0854  . sodium chloride flush (NS) 0.9 % injection 10 mL  10 mL  Intracatheter PRN Cammie Sickle, MD   10 mL at 07/01/18 0830    Review of Systems  Constitutional: Negative.  Negative for chills, diaphoresis, fever, malaise/fatigue and weight loss (stable).       Feels "fine".  HENT: Negative.  Negative for congestion, ear discharge, nosebleeds, sinus pain, sore throat and tinnitus.   Eyes: Negative.  Negative for blurred vision, double vision, photophobia and pain.  Respiratory: Negative.  Negative for cough, hemoptysis, sputum production, shortness of breath and wheezing.   Cardiovascular: Positive for leg swelling (chronic, improved). Negative for chest pain,  palpitations, orthopnea and PND.  Gastrointestinal: Negative.  Negative for abdominal pain, blood in stool, constipation, diarrhea, melena, nausea and vomiting.  Genitourinary: Negative.  Negative for dysuria, frequency, hematuria and urgency.  Musculoskeletal: Positive for joint pain (bilateral knees, painful ). Negative for back pain, falls, myalgias and neck pain.  Skin: Positive for rash (radiation dermatitis, improving). Negative for itching.  Neurological: Positive for sensory change (neuropathy stable). Negative for dizziness, tingling, tremors, speech change, focal weakness, weakness and headaches.  Endo/Heme/Allergies: Negative.  Does not bruise/bleed easily.  Psychiatric/Behavioral: Negative.  Negative for depression and memory loss. The patient is not nervous/anxious and does not have insomnia.   All other systems reviewed and are negative.  Performance status (ECOG):  1   No visits with results within 3 Day(s) from this visit.  Latest known visit with results is:  Infusion on 10/16/2018  Component Date Value Ref Range Status  . CA 27.29 10/16/2018 <3.5  0.0 - 38.6 U/mL Final   Comment: (NOTE) Siemens Centaur Immunochemiluminometric Methodology [employer]) Values obtained with different assay methods or kits cannot be used interchangeably. Results cannot be interpreted as  absolute evidence of the presence or absence of malignant disease. Performed At: [facility] [place], [place] [id number] Rush Farmer MD [id number]   . Sodium 10/16/2018 139  135 - 145 mmol/L Final  . Potassium 10/16/2018 3.5  3.5 - 5.1 mmol/L Final  . Chloride 10/16/2018 103  98 - 111 mmol/L Final  . CO2 10/16/2018 27  22 - 32 mmol/L Final  . Glucose, Bld 10/16/2018 108* 70 - 99 mg/dL Final  . BUN 10/16/2018 16  8 - 23 mg/dL Final  . Creatinine, Ser 10/16/2018 0.58  0.44 - 1.00 mg/dL Final  . Calcium 10/16/2018 8.8* 8.9 - 10.3 mg/dL Final  . Total Protein 10/16/2018 6.6  6.5 - 8.1 g/dL Final  . Albumin 10/16/2018 3.7  3.5 - 5.0 g/dL Final  . AST 10/16/2018 24  15 - 41 U/L Final  . ALT 10/16/2018 19  0 - 44 U/L Final  . Alkaline Phosphatase 10/16/2018 94  38 - 126 U/L Final  . Total Bilirubin 10/16/2018 0.6  0.3 - 1.2 mg/dL Final  . GFR calc non Af Amer 10/16/2018 >60  >60 mL/min Final  . GFR calc Af Amer 10/16/2018 >60  >60 mL/min Final  . Anion gap 10/16/2018 9  5 - 15 Final   Performed at [facility], [address], [place], [place] [address]  . WBC 10/16/2018 7.0  4.0 - 10.5 K/uL Final  . RBC 10/16/2018 4.06  3.87 - 5.11 MIL/uL Final  . Hemoglobin 10/16/2018 12.2  12.0 - 15.0 g/dL Final  . HCT 10/16/2018 37.5  36.0 - 46.0 % Final  . MCV 10/16/2018 92.4  80.0 - 100.0 fL Final  . MCH 10/16/2018 30.0  26.0 - 34.0 pg Final  . MCHC 10/16/2018 32.5  30.0 - 36.0 g/dL Final  . RDW 10/16/2018 14.7  11.5 - 15.5 % Final  . Platelets 10/16/2018 233  150 - 400 K/uL Final  . nRBC 10/16/2018 0.0  0.0 - 0.2 % Final  . Neutrophils Relative % 10/16/2018 60  % Final  . Neutro Abs 10/16/2018 4.2  1.7 - 7.7 K/uL Final  . Lymphocytes Relative 10/16/2018 23  % Final  . Lymphs Abs 10/16/2018 1.6  0.7 - 4.0 K/uL Final  . Monocytes Relative 10/16/2018 11  % Final  . Monocytes Absolute 10/16/2018 0.8  0.1 - 1.0  K/uL Final  . Eosinophils Relative 10/16/2018  5  % Final  . Eosinophils Absolute 10/16/2018 0.3  0.0 - 0.5 K/uL Final  . Basophils Relative 10/16/2018 1  % Final  . Basophils Absolute 10/16/2018 0.1  0.0 - 0.1 K/uL Final  . Immature Granulocytes 10/16/2018 0  % Final  . Abs Immature Granulocytes 10/16/2018 0.02  0.00 - 0.07 K/uL Final   Performed at [facility], [address] [place], [place] [address]    Assessment:  [name] [name] is a 85 y.o. female with stage I Her2/neu + breast cancer s/p lumpectomy and sentinel lymph node biopsy on 03/18/2018. Pathologyrevealed a 0.6 cm grade III invasive mammary carcinoma. There was DCIS. There was no lymphovascular invasion. Tumor was ER + (99%), PR + (25%), and Her2/neu + by FISH. Pathologic stagewas T1bN0.  Screening mammogram on 01/17/2018 revealed calcifications and possible mass in the right breast. Diagnostic mammogram and ultrasoundon 01/22/2018 revealed indeterminate group of microcalcifications over the right upper outer quadrant spanning 4 x 9 x 11 mm. There was a 4 x 7 x 7 mm probable complicated cyst at the 11 o'clock position of the right breast 5 cm from the nipple.  She received12 weeks ofHerceptin and Taxol (05/20/2018 - 08/05/2018). She has continued weekly Herceptin (09/05/2018; 09/26/2018 - 11/06/2018).  Herceptin was held on 11/13/2018.  She received whole breast radiation from 09/15/2018 - 11/05/2018.   CA27.29 has been followed: 10.7 on 10/03/2018 and < 3.5 on 10/16/2018.  Echoat [facility] on 03/12/2018 revealed an EF of 61%. [facility] on[facility]2019 revealed an LVEF of 50 to 55%. There was a trivial pericardial effusion waspresent.  Echo on 11/12/2018 revealed an EF of 45-50%.  He has a history of stage IIB ovarian cancer s/p exploratory laparotomy, TAH/BSO, omentectomy, peritoneal biopsies, selective pelvic and periaortic lymph node sampling on 06/20/2001. Pathologyrevealed a grade 1 papillary serous carcinoma of the ovary. Uterine  serosa and the surface of the contralateral ovary were positive for metastatic disease. The primary tumor was approximately 15 cm in diameter. She received 6 cycles ofcarboplatin and Taxol.  Bone densityon 04/01/2017 revealed osteoporosiswith a T-score of -2.5 in the left femoral neck.  She has grade I neuropathyin her feet. She has chronic right lower extremity edema. Right lower extremity duplexon 07/22/2018 revealed no femoropopliteal and no calf DVT in the visualized calf veins.  Symptomatically, she feels "fine".  She denies any symptoms of CHF.  She has a chronic cough.  Plan: 1. Labs today:CBC with diff, CMP, Mg. 2. Stage I HER-2/neu (+) breast cancer Clinically, she is doing well. She completed radiation on 11/05/2018. Discuss interval echo with decline in EF to 45-50%. Discuss need to hold Herceptin and recheck echo in 1 month. Discuss plan to initiate tamoxifen.    Side effects reviewed.  Patient consented to treatment.  Rx:  tamoxifen 20 mg a day (dis:#30; 1 refill). 3.   Chemotherapy-induced neuropathy She has a mild grade I neuropathy in her feet.  Continue to monitor. 4.   Osteoporosis Continue calcium and vitamin D.. Patient requires dental clearance. Patient follows up with Dr. Einar Pheasant re: Prolia in 11/2018. 5.   Decline in ejection fraction (EF)  Echo on 11/12/2018 revealed an EF of 45-50%.  Echo on 03/12/2018 revealed an EF of 61% on 03/12/2018 and an EFof 50%-55% on 08/18/2018.  Herceptin held on 11/13/2018.  Discuss plan to hold Herceptin x 4 weeks and assess EF on 12/10/2018.   Discuss plan to resume  Herceptin if EF returns to normal limits within 4 - 8 weeks and remains at ?15% decrease from baseline.    Discuss plans to discontinue Herceptin permanently for >8 weeks EF decline for cardiomyopathy. 6.   Echo on 12/10/2018.  7.RTC on 12/11/2018 for MD assessment, labs (CBC with diff, CMP, Mg, CA27.29), review of echo, and reinitiation of  Herceptin.   I discussed the assessment and treatment plan with the patient.  The patient was provided an opportunity to ask questions and all were answered.  The patient agreed with the plan and demonstrated an understanding of the instructions.  The patient was advised to call back or seek an in person evaluation if the symptoms worsen or if the condition fails to improve as anticipated.  I provided 23 minutes (9:05 AM - 9:28 AM) of non face-to-face telephone visit time during this this encounter and > 50% was spent counseling as documented under my assessment and plan.  I provided these services from the [facility] office.   Lequita Asal, MD, PhD    11/20/2018, 9:15 AM   I, Molly Dorshimer, am acting as Education administrator for [employer]. Mike Gip, MD, PhD.  I, Melissa C. Mike Gip, MD, have reviewed the above documentation for accuracy and completeness, and I agree with the above.

## 2018-11-19 NOTE — Patient Instructions (Addendum)
  [name] [name] , Thank you for taking time to come for your Medicare Wellness Visit. I appreciate your ongoing commitment to your health goals. Please review the following plan we discussed and let me know if I can assist you in the future.   These are the goals we discussed: Goals      Patient Stated   . Follow up with Primary Care Provider (pt-stated)     As needed       This is a list of the screening recommended for you and due dates:  Health Maintenance  Topic Date Due  . Mammogram  01/18/2019  . DEXA scan (bone density measurement)  Completed  . Flu Shot  Discontinued  . Tetanus Vaccine  Discontinued  . Pneumonia vaccines  Discontinued

## 2018-11-20 ENCOUNTER — Inpatient Hospital Stay: Payer: Medicare HMO | Attending: Hematology and Oncology | Admitting: Hematology and Oncology

## 2018-11-20 ENCOUNTER — Encounter: Payer: Self-pay | Admitting: Hematology and Oncology

## 2018-11-20 ENCOUNTER — Inpatient Hospital Stay: Payer: Medicare HMO

## 2018-11-20 ENCOUNTER — Other Ambulatory Visit: Payer: Medicare HMO

## 2018-11-20 ENCOUNTER — Ambulatory Visit: Payer: Medicare HMO | Admitting: Hematology and Oncology

## 2018-11-20 ENCOUNTER — Ambulatory Visit: Payer: Medicare HMO

## 2018-11-20 DIAGNOSIS — C50411 Malignant neoplasm of upper-outer quadrant of right female breast: Secondary | ICD-10-CM

## 2018-11-20 DIAGNOSIS — G620 Drug-induced polyneuropathy: Secondary | ICD-10-CM

## 2018-11-20 DIAGNOSIS — R931 Abnormal findings on diagnostic imaging of heart and coronary circulation: Secondary | ICD-10-CM | POA: Diagnosis not present

## 2018-11-20 DIAGNOSIS — M810 Age-related osteoporosis without current pathological fracture: Secondary | ICD-10-CM

## 2018-11-20 DIAGNOSIS — Z7189 Other specified counseling: Secondary | ICD-10-CM | POA: Diagnosis not present

## 2018-11-20 DIAGNOSIS — T451X5A Adverse effect of antineoplastic and immunosuppressive drugs, initial encounter: Secondary | ICD-10-CM

## 2018-11-20 DIAGNOSIS — Z170 Estrogen receptor positive status [ER+]: Secondary | ICD-10-CM

## 2018-11-20 MED ORDER — TAMOXIFEN CITRATE 20 MG PO TABS: 20.0000 mg | ORAL_TABLET | Freq: Every day | ORAL | 1 refills | Status: DC

## 2018-11-20 NOTE — Progress Notes (Signed)
No new changes noted today. The patient Name and DOB has been verified by phone today. 

## 2018-11-21 ENCOUNTER — Other Ambulatory Visit: Payer: Medicare HMO

## 2018-11-21 ENCOUNTER — Ambulatory Visit: Payer: Medicare HMO

## 2018-11-21 ENCOUNTER — Ambulatory Visit: Payer: Medicare HMO | Admitting: Hematology and Oncology

## 2018-12-04 ENCOUNTER — Other Ambulatory Visit: Payer: Self-pay

## 2018-12-04 ENCOUNTER — Ambulatory Visit: Payer: Medicare HMO | Admitting: Radiation Oncology

## 2018-12-05 ENCOUNTER — Encounter: Payer: Self-pay | Admitting: Radiation Oncology

## 2018-12-05 ENCOUNTER — Ambulatory Visit
Admission: RE | Admit: 2018-12-05 | Discharge: 2018-12-05 | Disposition: A | Discharge status: Home / Self Care | Payer: Medicare HMO | Source: Ambulatory Visit | Attending: Radiation Oncology | Admitting: Radiation Oncology

## 2018-12-05 ENCOUNTER — Other Ambulatory Visit: Payer: Self-pay

## 2018-12-05 VITALS — BP 142/69 | HR 65 | Temp 97.8°F | Resp 16 | Wt 161.3 lb

## 2018-12-05 DIAGNOSIS — Z79810 Long term (current) use of selective estrogen receptor modulators (SERMs): Secondary | ICD-10-CM | POA: Insufficient documentation

## 2018-12-05 DIAGNOSIS — C50411 Malignant neoplasm of upper-outer quadrant of right female breast: Secondary | ICD-10-CM | POA: Diagnosis not present

## 2018-12-05 DIAGNOSIS — Z923 Personal history of irradiation: Secondary | ICD-10-CM | POA: Insufficient documentation

## 2018-12-05 DIAGNOSIS — Z170 Estrogen receptor positive status [ER+]: Secondary | ICD-10-CM | POA: Diagnosis not present

## 2018-12-05 NOTE — Progress Notes (Signed)
Radiation Oncology Follow up Note  Name: [name] [name]   Date:   12/05/2018 MRN:  [id number] DOB: 1933-12-25    This 85 y.o. female presents to the clinic today for 1 month follow-up status post whole breast radiation to her right breast for triple positive invasive mammary carcinoma.  REFERRING PROVIDER: Einar Pheasant, MD  HPI: Patient is an 85 year old female now at 1 month having completed whole breast radiation to her right breast for stage I triple positive invasive mammary carcinoma have had completed chemotherapy.  Seen today in routine follow-up she is doing well.  She specifically denies breast tenderness cough or bone pain..  She is currently on tamoxifen tolerating that well without side effect.  She is being followed at [facility] has not yet had a follow-up mammogram.  COMPLICATIONS OF TREATMENT: none  FOLLOW UP COMPLIANCE: keeps appointments   PHYSICAL EXAM:  BP (!) 142/69 (BP Location: Left Arm, Patient Position: Sitting)   Pulse 65   Temp 97.8 F (36.6 C) (Tympanic)   Resp 16   Wt 161 lb 4.3 oz (73.1 kg)   BMI 28.57 kg/m  Lungs are clear to A&P cardiac examination essentially unremarkable with regular rate and rhythm. No dominant mass or nodularity is noted in either breast in 2 positions examined. Incision is well-healed. No axillary or supraclavicular adenopathy is appreciated. Cosmetic result is excellent.  Well-developed well-nourished patient in NAD. HEENT reveals PERLA, EOMI, discs not visualized.  Oral cavity is clear. No oral mucosal lesions are identified. Neck is clear without evidence of cervical or supraclavicular adenopathy. Lungs are clear to A&P. Cardiac examination is essentially unremarkable with regular rate and rhythm without murmur rub or thrill. Abdomen is benign with no organomegaly or masses noted. Motor sensory and DTR levels are equal and symmetric in the upper and lower extremities. Cranial nerves II through XII are grossly intact.  Proprioception is intact. No peripheral adenopathy or edema is identified. No motor or sensory levels are noted. Crude visual fields are within normal range.  RADIOLOGY RESULTS: No current films for review have asked her to forward her mammograms when they are performed at [facility] for my review.  PLAN: Present time patient is doing well with no evidence of disease recovering nicely 1 month out.  I am pleased with her overall progress.  I have asked to see her back in 4 to 5 months for follow-up.  She continues follow-up care at [facility] I have asked her to forward any follow-up mammograms to me when they are performed.  Patient knows to call with any concerns.  She continues on tamoxifen without side effect.  I would like to take this opportunity to thank you for allowing me to participate in the care of your patient.Noreene Filbert, MD

## 2018-12-09 NOTE — Progress Notes (Signed)
[facility]  [address], [address] [place], [place] [address] Phone: [phone]  Fax: [phone]   Clinic Day:  12/09/2018  Referring physician: Einar Pheasant, MD  Chief Complaint: [name] [name] is a 85 y.o. female with with stage I Her2/neu + breast cancer who is seen for 3 week assessment onHerceptin.   HPI: The patient was last seen in the medical oncology clinic on 11/20/2018.  At that time, she felt "fine".  She denied any symptoms of CHF.  She had a chronic cough.  Herceptin was held for 1 month with plan for follow-up echo.  She had a 1 month follow up with Dr. Noreene Filbert on 12/05/2018. The patient was doing well with no evidence of disease. She continued tamoxifen.   Echo on 12/10/2018 revealed an EF of 55-60%.  During the interim, the patient has been doing okay. The patient reports sleeping more than usual; she states "I just feel sleepy all the time". Edema in her legs have improved. She has bilateral knee pain. The patient reports her rash has improved. Numbness in her toes particularly her right big toe, and she denies any falls. She denies shortness of breath, fever, cough.   Past Medical History:  Diagnosis Date   Diverticulitis of sigmoid colon    Endometriosis    Fibrocystic breast disease    Glaucoma    H/O urinary incontinence    History of chicken pox    History of colon polyps 2014   Hyperlipidemia    Hypertension    Ovarian cancer ([place]) 2002   Chemo tx's @ [facility] with Total Hysterectomy. Dr Amalia Hailey   Personal history of chemotherapy    ovarian cancer   Port-A-Cath in place    Sinusitis    Tuberculosis    positve skin test    Past Surgical History:  Procedure Laterality Date   ABDOMINAL HYSTERECTOMY  2003   complete Dr Amalia Hailey   ABSCESS DRAINAGE  2017   right buttock at [place] EXCISIONAL BIOPSY Left 1993   excisional bx. negative results   BREAST SURGERY Left 1982    papilloma removal   COLONOSCOPY  2006, 2014   Dr Vira Agar    COLONOSCOPY N/A 03/27/2016   Procedure: COLONOSCOPY;  Surgeon: Robert Bellow, MD;  Location: [facility] ENDOSCOPY;  Service: Endoscopy;  Laterality: N/A;   COLOSTOMY REVERSAL  2014   at [place] Dr Raynaldo Opitz   NASAL SINUS SURGERY     For fungal infection   NASAL SINUS SURGERY  8/05 8/06   OOPHORECTOMY     OSTOMY  05/22/2012   diverticulitis with abcess/ Dr Tamala Julian   ovarian cancer     Exploratory lap   PORTA CATH INSERTION N/A 05/26/2018   Procedure: PORTA CATH INSERTION;  Surgeon: Algernon Huxley, MD;  Location: [facility];  Service: Cardiovascular;  Laterality: N/A;   TONSILLECTOMY AND ADENOIDECTOMY  1947    Family History  Problem Relation Age of Onset   Hypertension Mother    Coronary artery disease Mother    Heart disease Mother    Kidney disease Mother    Diabetes Mother    Cancer Mother        uterine cancer   Diabetes Father    Kidney failure Father    Diabetes Brother    Coronary artery disease Brother    Cancer Brother        bladder cancer   Stroke Sister  Heart disease Sister        Pace Maker   Cancer Maternal Aunt        ovarian cancer   Breast cancer Cousin    Cancer Brother        Prostate   Breast cancer Cousin    Breast cancer Cousin     Social History:  reports that she has never smoked. She has never used smokeless tobacco. She reports that she does not drink alcohol or use drugs. Patient is a retired Engineer, site. Generally accompanied to appointments by friend, [name].  She lives in [place]. The patient is alone today.  Allergies: No Known Allergies  Current Medications: Current Outpatient Medications  Medication Sig Dispense Refill   amLODipine (NORVASC) 2.5 MG tablet Take 1 tablet (2.5 mg total) by mouth daily. 30 tablet 2   Calcium Carbonate-Vitamin D 600-400 MG-UNIT tablet Take 1 tablet by mouth daily.       lidocaine-prilocaine (EMLA) cream Apply 1 application topically as needed. (Patient not taking: Reported on 11/20/2018) 30 g 5   Multiple Vitamins-Minerals (MULTIVITAMIN WITH MINERALS) tablet Take 1 tablet by mouth daily.     prochlorperazine (COMPAZINE) 10 MG tablet Take 1 tablet (10 mg total) by mouth every 6 (six) hours as needed for nausea or vomiting. (Patient not taking: Reported on 11/20/2018) 30 tablet 3   tamoxifen (NOLVADEX) 20 MG tablet Take 1 tablet (20 mg total) by mouth daily. 30 tablet 1   Current Facility-Administered Medications  Medication Dose Route Frequency Provider Last Rate Last Dose   lidocaine-prilocaine (EMLA) cream   Topical PRN Lucky Cowboy, Erskine Squibb, MD       Facility-Administered Medications Ordered in Other Visits  Medication Dose Route Frequency Provider Last Rate Last Dose   sodium chloride 0.9 % 50 mL with famotidine (PEPCID) 20 mg infusion  20 mg Intravenous Once Charlaine Dalton R, MD       sodium chloride flush (NS) 0.9 % injection 10 mL  10 mL Intravenous PRN Charlaine Dalton R, MD   10 mL at 06/24/18 0854   sodium chloride flush (NS) 0.9 % injection 10 mL  10 mL Intracatheter PRN Cammie Sickle, MD   10 mL at 07/01/18 0830    Review of Systems  Constitutional: Positive for malaise/fatigue. Negative for chills, diaphoresis, fever and weight loss (stable).       Doing okay.  HENT: Negative.  Negative for congestion, ear discharge, nosebleeds, sinus pain, sore throat and tinnitus.   Eyes: Negative.  Negative for blurred vision, double vision, photophobia and pain.  Respiratory: Negative.  Negative for cough, hemoptysis, sputum production, shortness of breath and wheezing.   Cardiovascular: Positive for leg swelling (left leg - chronic, improved). Negative for chest pain, palpitations, orthopnea and PND.  Gastrointestinal: Negative.  Negative for abdominal pain, blood in stool, constipation, diarrhea, melena, nausea and vomiting.  Genitourinary:  Negative.  Negative for dysuria, frequency, hematuria and urgency.  Musculoskeletal: Positive for joint pain (bilateral knees). Negative for back pain, falls, myalgias and neck pain.  Skin: Positive for rash (radiation dermatitis, improving). Negative for itching.  Neurological: Positive for sensory change (numbness in great toe). Negative for dizziness, tingling, tremors, speech change, focal weakness, weakness and headaches.  Endo/Heme/Allergies: Negative.  Does not bruise/bleed easily.  Psychiatric/Behavioral: Negative for depression and memory loss. The patient is not nervous/anxious and does not have insomnia.        Sleeps a lot.  All other systems reviewed and are negative.  Performance status (ECOG): 1  Vital Signs Blood pressure 118/69, pulse 60, temperature (!) 97 F (36.1 C), temperature source Tympanic, resp. rate 17, weight 161 lb 6 oz (73.2 kg), SpO2 96 %.   Physical Exam  Constitutional: She is oriented to person, place, and time. She appears well-developed and well-nourished. No distress.  HENT:  Head: Normocephalic and atraumatic.  Mouth/Throat: Oropharynx is clear and moist. No oropharyngeal exudate.  Wearing a hat and mask.  Dentures.  Eyes: Pupils are equal, round, and reactive to light. Conjunctivae and EOM are normal. No scleral icterus.  Glasses.  Brown eyes.  Neck: Normal range of motion. Neck supple. No JVD present.  Cardiovascular: Normal rate, regular rhythm and normal heart sounds. Exam reveals no gallop and no friction rub.  No murmur heard. Pulmonary/Chest: Effort normal and breath sounds normal. No respiratory distress. She has no wheezes. She has no rales. She exhibits no tenderness.  Abdominal: Soft. Bowel sounds are normal. She exhibits no distension and no mass. There is no abdominal tenderness. There is no rebound and no guarding.  Musculoskeletal: Normal range of motion.        General: Edema (trace left ankle edema) present.  Lymphadenopathy:     She has no cervical adenopathy.    She has no axillary adenopathy.       Right: No supraclavicular adenopathy present.       Left: No supraclavicular adenopathy present.  Neurological: She is alert and oriented to person, place, and time. She has normal reflexes.  Skin: Skin is warm and dry. No rash noted. She is not diaphoretic. No erythema. No pallor.  Psychiatric: She has a normal mood and affect. Her behavior is normal. Judgment and thought content normal.  Nursing note and vitals reviewed.   No visits with results within 3 Day(s) from this visit.  Latest known visit with results is:  Infusion on 10/16/2018  Component Date Value Ref Range Status   CA 27.29 10/16/2018 <3.5  0.0 - 38.6 U/mL Final   Comment: (NOTE) Siemens Centaur Immunochemiluminometric Methodology (ICMA) Values obtained with different assay methods or kits cannot be used interchangeably. Results cannot be interpreted as absolute evidence of the presence or absence of malignant disease. Performed At: [facility] [place], [place] [id number] Rush Farmer MD OZ:[id number]    Sodium 10/16/2018 139  135 - 145 mmol/L Final   Potassium 10/16/2018 3.5  3.5 - 5.1 mmol/L Final   Chloride 10/16/2018 103  98 - 111 mmol/L Final   CO2 10/16/2018 27  22 - 32 mmol/L Final   Glucose, Bld 10/16/2018 108* 70 - 99 mg/dL Final   BUN 10/16/2018 16  8 - 23 mg/dL Final   Creatinine, Ser 10/16/2018 0.58  0.44 - 1.00 mg/dL Final   Calcium 10/16/2018 8.8* 8.9 - 10.3 mg/dL Final   Total Protein 10/16/2018 6.6  6.5 - 8.1 g/dL Final   Albumin 10/16/2018 3.7  3.5 - 5.0 g/dL Final   AST 10/16/2018 24  15 - 41 U/L Final   ALT 10/16/2018 19  0 - 44 U/L Final   Alkaline Phosphatase 10/16/2018 94  38 - 126 U/L Final   Total Bilirubin 10/16/2018 0.6  0.3 - 1.2 mg/dL Final   GFR calc non Af Amer 10/16/2018 >60  >60 mL/min Final   GFR calc Af Amer 10/16/2018 >60  >60 mL/min Final   Anion gap 10/16/2018  9  5 - 15 Final   Performed at [facility],  [address], [place], [place] [address]   WBC 10/16/2018 7.0  4.0 - 10.5 K/uL Final   RBC 10/16/2018 4.06  3.87 - 5.11 MIL/uL Final   Hemoglobin 10/16/2018 12.2  12.0 - 15.0 g/dL Final   HCT 10/16/2018 37.5  36.0 - 46.0 % Final   MCV 10/16/2018 92.4  80.0 - 100.0 fL Final   MCH 10/16/2018 30.0  26.0 - 34.0 pg Final   MCHC 10/16/2018 32.5  30.0 - 36.0 g/dL Final   RDW 10/16/2018 14.7  11.5 - 15.5 % Final   Platelets 10/16/2018 233  150 - 400 K/uL Final   nRBC 10/16/2018 0.0  0.0 - 0.2 % Final   Neutrophils Relative % 10/16/2018 60  % Final   Neutro Abs 10/16/2018 4.2  1.7 - 7.7 K/uL Final   Lymphocytes Relative 10/16/2018 23  % Final   Lymphs Abs 10/16/2018 1.6  0.7 - 4.0 K/uL Final   Monocytes Relative 10/16/2018 11  % Final   Monocytes Absolute 10/16/2018 0.8  0.1 - 1.0 K/uL Final   Eosinophils Relative 10/16/2018 5  % Final   Eosinophils Absolute 10/16/2018 0.3  0.0 - 0.5 K/uL Final   Basophils Relative 10/16/2018 1  % Final   Basophils Absolute 10/16/2018 0.1  0.0 - 0.1 K/uL Final   Immature Granulocytes 10/16/2018 0  % Final   Abs Immature Granulocytes 10/16/2018 0.02  0.00 - 0.07 K/uL Final   Performed at [facility], [address], [place], [place] [address]    Assessment:  CRYSTAL SCARBERRY is a 85 y.o. female with stage I Her2/neu + breast cancer s/p lumpectomy and sentinel lymph node biopsy on 03/18/2018. Pathologyrevealed a 0.6 cm grade III invasive mammary carcinoma. There was DCIS. There was no lymphovascular invasion. Tumor was ER + (99%), PR + (25%), and Her2/neu + by FISH. Pathologic stagewas T1bN0.  Screening mammogram on 01/17/2018 revealed calcifications and possible mass in the right breast. Diagnostic mammogram and ultrasoundon 01/22/2018 revealed indeterminate group of microcalcifications over the right upper outer quadrant spanning 4 x 9 x 11 mm. There was a 4  x 7 x 7 mm probable complicated cyst at the 11 o'clock position of the right breast 5 cm from the nipple.  Shereceived12 weeks ofHerceptin and Taxol (05/20/2018 - 08/05/2018).She has continued weekly Herceptin(09/05/2018; 09/26/2018 - 11/06/2018).  Herceptin was held on 11/13/2018.  Shereceived whole breastradiationfrom 09/15/2018 - 11/05/2018.  Tamoxifen began 11/20/2018.  CA27.29 has been followed: 10.7 on 10/03/2018 and < 3.5 on 10/16/2018.  Echoat [facility] on 03/12/2018 revealed an EF of 61%. [facility] on[facility]2019 revealed an LVEF of 50 to 55%. There was a trivial pericardial effusion waspresent.  Echo on 11/12/2018 revealed an EF of 45-50%.  Echo on 12/10/2018 revealed an EF of 55-60%.  He has a history of stage IIB ovarian cancer s/p exploratory laparotomy, TAH/BSO, omentectomy, peritoneal biopsies, selective pelvic and periaortic lymph node sampling on 06/20/2001. Pathologyrevealed a grade 1 papillary serous carcinoma of the ovary. Uterine serosa and the surface of the contralateral ovary were positive for metastatic disease. The primary tumor was approximately 15 cm in diameter. She received 6 cycles ofcarboplatin and Taxol.  Bone densityon 04/01/2017 revealed osteoporosiswith a T-score of -2.5 in the left femoral neck.  She has grade I neuropathyin her feet. She has chronic right lower extremity edema. Right lower extremity duplexon 07/22/2018 revealed no femoropopliteal and no calf DVT in the visualized calf veins.  Symptomatically, she is doing well.  She has been  sleeping a lot.  Exam reveals trace lower extremity edema.  Plan: 1.   Labs today:CBC with diff, CMP, Mg, CA 27.29  2.   Stage I HER-2/neu (+) breast cancer Clinically, she continues to do well.   Radiation completed on 11/05/2018.   Follow-up echocardiogram revealed a normal ejection fraction of 55-60%.  Discuss plan to restart Herceptin.  Patient consented to treatment. She is tolerating  tamoxifen well (began 11/20/2018). 3.   Chemotherapy-induced neuropathy Mild neuropathy in her great toe. Neuropathy is not affecting her gait. Continue to monitor. 4.   Osteoporosis Continue calcium and vitamin D.   Discuss plans to institute Prolia after dental clearance . 5.   Decline in ejection fraction (EF)  Echo on 08/18/2018 revealed an EF of 50-55%.             Echo on 11/12/2018 revealed an EF of 45-50%.             Echo on 12/10/2018 revealed an EF of 55-60%.             Herceptin held x 1 month.  Review plan to reinstitute Herceptin.  Patient in agreement.             Follow echo closely. 6.   Herceptin today. 7.   Herceptin weekly x 3. 8.   RTC in 4 weeks for MD assessment, labs (CBC with diff, CMP, Mg), and Herceptin +/- Prolia.  I discussed the assessment and treatment plan with the patient.  The patient was provided an opportunity to ask questions and all were answered.  The patient agreed with the plan and demonstrated an understanding of the instructions.  The patient was advised to call back if the symptoms worsen or if the condition fails to improve as anticipated.   Lequita Asal, MD, PhD    12/09/2018, 6:13 PM  I, Selena Batten, am acting as scribe for [employer]. Mike Gip, MD, PhD.  I, Grasiela Jonsson C. Mike Gip, MD, have reviewed the above documentation for accuracy and completeness, and I agree with the above.

## 2018-12-10 ENCOUNTER — Other Ambulatory Visit: Payer: Self-pay

## 2018-12-10 ENCOUNTER — Ambulatory Visit
Admission: RE | Admit: 2018-12-10 | Discharge: 2018-12-10 | Disposition: A | Discharge status: Home / Self Care | Payer: Medicare HMO | Source: Ambulatory Visit | Attending: Hematology and Oncology | Admitting: Hematology and Oncology

## 2018-12-10 DIAGNOSIS — E785 Hyperlipidemia, unspecified: Secondary | ICD-10-CM | POA: Diagnosis not present

## 2018-12-10 DIAGNOSIS — I10 Essential (primary) hypertension: Secondary | ICD-10-CM | POA: Insufficient documentation

## 2018-12-10 DIAGNOSIS — R931 Abnormal findings on diagnostic imaging of heart and coronary circulation: Secondary | ICD-10-CM | POA: Diagnosis not present

## 2018-12-10 DIAGNOSIS — I340 Nonrheumatic mitral (valve) insufficiency: Secondary | ICD-10-CM | POA: Insufficient documentation

## 2018-12-10 NOTE — Progress Notes (Signed)
*  PRELIMINARY RESULTS* Echocardiogram 2D Echocardiogram has been performed.  [name] [name] [name] 12/10/2018, 11:36 AM

## 2018-12-11 ENCOUNTER — Inpatient Hospital Stay: Payer: Medicare HMO

## 2018-12-11 ENCOUNTER — Inpatient Hospital Stay: Payer: Medicare HMO | Attending: Hematology and Oncology | Admitting: Hematology and Oncology

## 2018-12-11 VITALS — BP 118/69 | HR 60 | Temp 97.0°F | Resp 17 | Wt 161.4 lb

## 2018-12-11 DIAGNOSIS — M810 Age-related osteoporosis without current pathological fracture: Secondary | ICD-10-CM | POA: Insufficient documentation

## 2018-12-11 DIAGNOSIS — R931 Abnormal findings on diagnostic imaging of heart and coronary circulation: Secondary | ICD-10-CM

## 2018-12-11 DIAGNOSIS — R600 Localized edema: Secondary | ICD-10-CM | POA: Insufficient documentation

## 2018-12-11 DIAGNOSIS — T451X5A Adverse effect of antineoplastic and immunosuppressive drugs, initial encounter: Secondary | ICD-10-CM

## 2018-12-11 DIAGNOSIS — Z5112 Encounter for antineoplastic immunotherapy: Secondary | ICD-10-CM

## 2018-12-11 DIAGNOSIS — C50411 Malignant neoplasm of upper-outer quadrant of right female breast: Secondary | ICD-10-CM

## 2018-12-11 DIAGNOSIS — Z170 Estrogen receptor positive status [ER+]: Secondary | ICD-10-CM

## 2018-12-11 DIAGNOSIS — Z8543 Personal history of malignant neoplasm of ovary: Secondary | ICD-10-CM | POA: Diagnosis not present

## 2018-12-11 DIAGNOSIS — G620 Drug-induced polyneuropathy: Secondary | ICD-10-CM | POA: Diagnosis not present

## 2018-12-11 LAB — COMPREHENSIVE METABOLIC PANEL
ALT: 18 U/L (ref 0–44)
AST: 23 U/L (ref 15–41)
Albumin: 3.4 g/dL — ABNORMAL LOW (ref 3.5–5.0)
Alkaline Phosphatase: 74 U/L (ref 38–126)
Anion gap: 8 (ref 5–15)
BUN: 17 mg/dL (ref 8–23)
CO2: 25 mmol/L (ref 22–32)
Calcium: 8.6 mg/dL — ABNORMAL LOW (ref 8.9–10.3)
Chloride: 104 mmol/L (ref 98–111)
Creatinine, Ser: 0.6 mg/dL (ref 0.44–1.00)
GFR calc Af Amer: >60 mL/min (ref >60)
GFR calc non Af Amer: >60 mL/min (ref >60)
Glucose, Bld: 89 mg/dL (ref 70–99)
Potassium: 3.9 mmol/L (ref 3.5–5.1)
Sodium: 137 mmol/L (ref 135–145)
Total Bilirubin: 0.4 mg/dL (ref 0.3–1.2)
Total Protein: 6.3 g/dL — ABNORMAL LOW (ref 6.5–8.1)

## 2018-12-11 LAB — CBC WITH DIFFERENTIAL/PLATELET
Abs Immature Granulocytes: 0.02 10*3/uL (ref 0.00–0.07)
Basophils Absolute: 0.1 10*3/uL (ref 0.0–0.1)
Basophils Relative: 1 %
Eosinophils Absolute: 0 10*3/uL (ref 0.0–0.5)
Eosinophils Relative: 1 %
HCT: 36.1 % (ref 36.0–46.0)
Hemoglobin: 11.7 g/dL — ABNORMAL LOW (ref 12.0–15.0)
Immature Granulocytes: 0 %
Lymphocytes Relative: 20 %
Lymphs Abs: 1.4 10*3/uL (ref 0.7–4.0)
MCH: 30 pg (ref 26.0–34.0)
MCHC: 32.4 g/dL (ref 30.0–36.0)
MCV: 92.6 fL (ref 80.0–100.0)
Monocytes Absolute: 0.7 10*3/uL (ref 0.1–1.0)
Monocytes Relative: 10 %
Neutro Abs: 4.8 10*3/uL (ref 1.7–7.7)
Neutrophils Relative %: 68 %
Platelets: 200 10*3/uL (ref 150–400)
RBC: 3.9 MIL/uL (ref 3.87–5.11)
RDW: 15.9 % — ABNORMAL HIGH (ref 11.5–15.5)
WBC: 7.1 10*3/uL (ref 4.0–10.5)
nRBC: 0 % (ref 0.0–0.2)

## 2018-12-11 LAB — MAGNESIUM: Magnesium: 2.1 mg/dL (ref 1.7–2.4)

## 2018-12-11 MED ORDER — SODIUM CHLORIDE 0.9% FLUSH
10.0000 mL | INTRAVENOUS | Status: DC | PRN
  Administered 2018-12-11: 10:00:00 10 mL via INTRAVENOUS
  Filled 2018-12-11: qty 10

## 2018-12-11 MED ORDER — SODIUM CHLORIDE 0.9 % IV SOLN
Freq: Once | INTRAVENOUS | Status: AC
  Administered 2018-12-11: 11:00:00 via INTRAVENOUS
  Filled 2018-12-11: qty 250

## 2018-12-11 MED ORDER — HEPARIN SOD (PORK) LOCK FLUSH 100 UNIT/ML IV SOLN
500.0000 [IU] | Freq: Once | INTRAVENOUS | Status: AC
  Administered 2018-12-11: 12:00:00 500 [IU] via INTRAVENOUS

## 2018-12-11 MED ORDER — DIPHENHYDRAMINE HCL 25 MG PO CAPS: 25.0000 mg | ORAL_CAPSULE | Freq: Once | ORAL | Status: DC

## 2018-12-11 MED ORDER — TRASTUZUMAB CHEMO 150 MG IV SOLR: 2.0000 mg/kg | Freq: Once | INTRAVENOUS | Status: DC

## 2018-12-11 MED ORDER — ACETAMINOPHEN 325 MG PO TABS: 650.0000 mg | ORAL_TABLET | Freq: Once | ORAL | Status: DC

## 2018-12-11 NOTE — Progress Notes (Signed)
Pt here for follow up. Denies any concerns.  

## 2018-12-11 NOTE — Progress Notes (Signed)
No Herceptin treatment today as drug was not prior authorized.

## 2018-12-14 LAB — CANCER ANTIGEN 27.29: CA 27.29: 8.3 U/mL (ref 0.0–38.6)

## 2018-12-17 ENCOUNTER — Other Ambulatory Visit: Payer: Self-pay

## 2018-12-18 ENCOUNTER — Telehealth: Payer: Self-pay

## 2018-12-18 ENCOUNTER — Other Ambulatory Visit: Payer: Self-pay | Admitting: Hematology and Oncology

## 2018-12-18 ENCOUNTER — Telehealth: Payer: Self-pay | Admitting: *Deleted

## 2018-12-18 ENCOUNTER — Inpatient Hospital Stay: Payer: Medicare HMO

## 2018-12-18 VITALS — BP 144/72 | HR 59 | Temp 97.0°F | Resp 18

## 2018-12-18 DIAGNOSIS — M810 Age-related osteoporosis without current pathological fracture: Secondary | ICD-10-CM | POA: Diagnosis not present

## 2018-12-18 DIAGNOSIS — Z170 Estrogen receptor positive status [ER+]: Secondary | ICD-10-CM

## 2018-12-18 DIAGNOSIS — Z8543 Personal history of malignant neoplasm of ovary: Secondary | ICD-10-CM | POA: Diagnosis not present

## 2018-12-18 DIAGNOSIS — Z5112 Encounter for antineoplastic immunotherapy: Secondary | ICD-10-CM | POA: Diagnosis not present

## 2018-12-18 DIAGNOSIS — C50411 Malignant neoplasm of upper-outer quadrant of right female breast: Secondary | ICD-10-CM

## 2018-12-18 DIAGNOSIS — R600 Localized edema: Secondary | ICD-10-CM | POA: Diagnosis not present

## 2018-12-18 DIAGNOSIS — G620 Drug-induced polyneuropathy: Secondary | ICD-10-CM | POA: Diagnosis not present

## 2018-12-18 MED ORDER — HEPARIN SOD (PORK) LOCK FLUSH 100 UNIT/ML IV SOLN
500.0000 [IU] | Freq: Once | INTRAVENOUS | Status: AC | PRN
  Administered 2018-12-18: 500 [IU]
  Filled 2018-12-18: qty 5

## 2018-12-18 MED ORDER — TRASTUZUMAB CHEMO 150 MG IV SOLR
150.0000 mg | Freq: Once | INTRAVENOUS | Status: AC
  Administered 2018-12-18: 14:00:00 150 mg via INTRAVENOUS
  Filled 2018-12-18: qty 7.14

## 2018-12-18 MED ORDER — SODIUM CHLORIDE 0.9% FLUSH
10.0000 mL | INTRAVENOUS | Status: DC | PRN
  Administered 2018-12-18: 10 mL
  Filled 2018-12-18: qty 10

## 2018-12-18 MED ORDER — DIPHENHYDRAMINE HCL 25 MG PO CAPS
25.0000 mg | ORAL_CAPSULE | Freq: Once | ORAL | Status: AC
  Administered 2018-12-18: 25 mg via ORAL

## 2018-12-18 MED ORDER — SODIUM CHLORIDE 0.9 % IV SOLN
Freq: Once | INTRAVENOUS | Status: AC
  Administered 2018-12-18: 14:00:00 via INTRAVENOUS
  Filled 2018-12-18: qty 250

## 2018-12-18 MED ORDER — ACETAMINOPHEN 325 MG PO TABS
650.0000 mg | ORAL_TABLET | Freq: Once | ORAL | Status: AC
  Administered 2018-12-18: 14:00:00 650 mg via ORAL

## 2018-12-18 NOTE — Telephone Encounter (Signed)
I can get approval for here.

## 2018-12-18 NOTE — Telephone Encounter (Signed)
I do not mind her getting her injections here.  Will she need to go through approval here.  Is is already approved?

## 2018-12-18 NOTE — Telephone Encounter (Signed)
Ok.  So, I am ok with either place.

## 2018-12-18 NOTE — Telephone Encounter (Signed)
Spoke with patient regarding Prolia. Patient states she contacted PCP office today and is waiting on a return call. States she would prefer her PCP handle her osteoporosis. Patient reports she will call back or inform is at next OV what her PCP suggested.

## 2018-12-18 NOTE — Telephone Encounter (Signed)
-----   Message from Arlan Organ, RN sent at 12/11/2018 11:12 AM EDT ----- Regarding: Prolia Call patient to find out if she talked to PCP about Prolia.

## 2018-12-18 NOTE — Telephone Encounter (Signed)
Copied from [place]. Topic: General - Inquiry >> Dec 18, 2018 10:39 AM Rutherford Nail, NT wrote: Reason for CRM: Patient calling and states that her and her provider at the cancer center have been discussing Prolia. Patient states that her and Dr Nicki Reaper have talked about this before. Patient would like to know f this is something that Dr Nicki Reaper could follow or should she have her injections done at the cancer center? Please advise.  CB#: [phone]

## 2018-12-19 ENCOUNTER — Telehealth: Payer: Self-pay

## 2018-12-19 ENCOUNTER — Other Ambulatory Visit: Payer: Self-pay | Admitting: Internal Medicine

## 2018-12-19 NOTE — Telephone Encounter (Signed)
Defer to [place]

## 2018-12-19 NOTE — Telephone Encounter (Signed)
-----   Message from Lequita Asal, MD sent at 12/19/2018 12:46 PM EDT ----- Regarding: Please call patient  Rx for amlodipine sent to [place].    This is a medication prescribed by her PCP.  M

## 2018-12-19 NOTE — Telephone Encounter (Signed)
Contacted patient to inform her that we received an RX request for amlodipine and informed her this should be filled by her PCP. Patient reports she informed pharmacy it is to be sent to her PCP and apologized for the mistake. Patient states she will reach out to pharmacy to inform them.

## 2018-12-24 ENCOUNTER — Other Ambulatory Visit: Payer: Self-pay

## 2018-12-25 ENCOUNTER — Other Ambulatory Visit: Payer: Self-pay | Admitting: Hematology and Oncology

## 2018-12-25 ENCOUNTER — Inpatient Hospital Stay: Payer: Medicare HMO

## 2018-12-25 VITALS — BP 145/69 | HR 58 | Temp 98.1°F | Resp 18

## 2018-12-25 DIAGNOSIS — M810 Age-related osteoporosis without current pathological fracture: Secondary | ICD-10-CM | POA: Diagnosis not present

## 2018-12-25 DIAGNOSIS — Z170 Estrogen receptor positive status [ER+]: Secondary | ICD-10-CM

## 2018-12-25 DIAGNOSIS — C50411 Malignant neoplasm of upper-outer quadrant of right female breast: Secondary | ICD-10-CM

## 2018-12-25 DIAGNOSIS — G620 Drug-induced polyneuropathy: Secondary | ICD-10-CM | POA: Diagnosis not present

## 2018-12-25 DIAGNOSIS — Z8543 Personal history of malignant neoplasm of ovary: Secondary | ICD-10-CM | POA: Diagnosis not present

## 2018-12-25 DIAGNOSIS — Z5112 Encounter for antineoplastic immunotherapy: Secondary | ICD-10-CM | POA: Diagnosis not present

## 2018-12-25 DIAGNOSIS — R600 Localized edema: Secondary | ICD-10-CM | POA: Diagnosis not present

## 2018-12-25 MED ORDER — TRASTUZUMAB CHEMO 150 MG IV SOLR
150.0000 mg | Freq: Once | INTRAVENOUS | Status: AC
  Administered 2018-12-25: 14:00:00 150 mg via INTRAVENOUS
  Filled 2018-12-25: qty 7.14

## 2018-12-25 MED ORDER — SODIUM CHLORIDE 0.9% FLUSH
10.0000 mL | INTRAVENOUS | Status: DC | PRN
  Administered 2018-12-25: 14:00:00 10 mL
  Filled 2018-12-25: qty 10

## 2018-12-25 MED ORDER — ACETAMINOPHEN 325 MG PO TABS
650.0000 mg | ORAL_TABLET | Freq: Once | ORAL | Status: AC
  Administered 2018-12-25: 14:00:00 650 mg via ORAL

## 2018-12-25 MED ORDER — HEPARIN SOD (PORK) LOCK FLUSH 100 UNIT/ML IV SOLN
500.0000 [IU] | Freq: Once | INTRAVENOUS | Status: AC | PRN
  Administered 2018-12-25: 15:00:00 500 [IU]

## 2018-12-25 MED ORDER — SODIUM CHLORIDE 0.9 % IV SOLN
Freq: Once | INTRAVENOUS | Status: AC
  Administered 2018-12-25: 14:00:00 via INTRAVENOUS
  Filled 2018-12-25: qty 250

## 2018-12-25 MED ORDER — DIPHENHYDRAMINE HCL 25 MG PO CAPS
25.0000 mg | ORAL_CAPSULE | Freq: Once | ORAL | Status: AC
  Administered 2018-12-25: 14:00:00 25 mg via ORAL

## 2018-12-26 ENCOUNTER — Other Ambulatory Visit: Payer: Self-pay | Admitting: Internal Medicine

## 2018-12-30 ENCOUNTER — Telehealth: Payer: Self-pay | Admitting: Licensed Clinical Social Worker

## 2018-12-30 NOTE — Telephone Encounter (Signed)
Spoke with [name] [name] about her upcoming genetic counseling appointment on 7/30. She would like this visit to be a walk-in.

## 2018-12-31 ENCOUNTER — Other Ambulatory Visit: Payer: Self-pay

## 2019-01-01 ENCOUNTER — Inpatient Hospital Stay: Payer: Medicare HMO

## 2019-01-01 ENCOUNTER — Other Ambulatory Visit: Payer: Self-pay

## 2019-01-01 VITALS — BP 120/67 | HR 61 | Temp 97.4°F | Resp 17

## 2019-01-01 DIAGNOSIS — M810 Age-related osteoporosis without current pathological fracture: Secondary | ICD-10-CM | POA: Diagnosis not present

## 2019-01-01 DIAGNOSIS — R600 Localized edema: Secondary | ICD-10-CM | POA: Diagnosis not present

## 2019-01-01 DIAGNOSIS — G620 Drug-induced polyneuropathy: Secondary | ICD-10-CM | POA: Diagnosis not present

## 2019-01-01 DIAGNOSIS — Z170 Estrogen receptor positive status [ER+]: Secondary | ICD-10-CM | POA: Diagnosis not present

## 2019-01-01 DIAGNOSIS — C50411 Malignant neoplasm of upper-outer quadrant of right female breast: Secondary | ICD-10-CM

## 2019-01-01 DIAGNOSIS — Z8543 Personal history of malignant neoplasm of ovary: Secondary | ICD-10-CM | POA: Diagnosis not present

## 2019-01-01 DIAGNOSIS — Z5112 Encounter for antineoplastic immunotherapy: Secondary | ICD-10-CM | POA: Diagnosis not present

## 2019-01-01 MED ORDER — HEPARIN SOD (PORK) LOCK FLUSH 100 UNIT/ML IV SOLN
500.0000 [IU] | Freq: Once | INTRAVENOUS | Status: AC | PRN
  Administered 2019-01-01: 15:00:00 500 [IU]
  Filled 2019-01-01: qty 5

## 2019-01-01 MED ORDER — SODIUM CHLORIDE 0.9 % IV SOLN
Freq: Once | INTRAVENOUS | Status: AC
  Administered 2019-01-01: 14:00:00 via INTRAVENOUS
  Filled 2019-01-01: qty 250

## 2019-01-01 MED ORDER — TRASTUZUMAB CHEMO 150 MG IV SOLR
150.0000 mg | Freq: Once | INTRAVENOUS | Status: AC
  Administered 2019-01-01: 14:00:00 150 mg via INTRAVENOUS
  Filled 2019-01-01: qty 7.14

## 2019-01-01 MED ORDER — ACETAMINOPHEN 325 MG PO TABS
650.0000 mg | ORAL_TABLET | Freq: Once | ORAL | Status: AC
  Administered 2019-01-01: 650 mg via ORAL
  Filled 2019-01-01: qty 2

## 2019-01-01 MED ORDER — SODIUM CHLORIDE 0.9% FLUSH
10.0000 mL | INTRAVENOUS | Status: DC | PRN
  Administered 2019-01-01: 14:00:00 10 mL
  Filled 2019-01-01: qty 10

## 2019-01-01 MED ORDER — DIPHENHYDRAMINE HCL 25 MG PO CAPS
25.0000 mg | ORAL_CAPSULE | Freq: Once | ORAL | Status: AC
  Administered 2019-01-01: 25 mg via ORAL
  Filled 2019-01-01: qty 1

## 2019-01-06 NOTE — Progress Notes (Signed)
[facility]  [address], [address] [place], [place] [address] Phone: [phone]  Fax: [phone]   Clinic Day:  01/08/2019  Referring physician: Einar Pheasant, MD  Chief Complaint: [name] [name] is a 85 y.o. female with stage I Her2/neu + breast cancer who is seen for 4 week assessment on Herceptin.  HPI: The patient was last seen in the medical oncology clinic on 12/11/2018. At that time, she was doing well. She had been sleeping a lot. Exam revealed trace lower extremity edema. She continued tamoxifen. CA 27.29 was 8.3.   She received Herceptin on 12/18/2018, 12/25/2018, 01/01/2019.  Patient requested Dr. Nicki Reaper handle her osteoporosis when called on 12/18/2018 regarding her Prolia.   The patient has an upcoming genetic counseling with Faith Rogue today (01/08/2019). She notes doing a blood test at her genetic counseling appointment with Faith Rogue today.  During the interim, she has done well. She is tolerating Herceptin.  Leg edema has improved. She notes joint pain in her knees. The right knee hurts the worst.  She has a residual neuropathy in her right big toe.  She notes being fatigued and having an increase in appetite.    Past Medical History:  Diagnosis Date   Diverticulitis of sigmoid colon    Endometriosis    Family history of breast cancer    Family history of ovarian cancer    Family history of prostate cancer    Family history of uterine cancer    Fibrocystic breast disease    Glaucoma    H/O urinary incontinence    History of chicken pox    History of colon polyps 2014   Hyperlipidemia    Hypertension    Ovarian cancer ([place]) 2002   Chemo tx's @ [facility] with Total Hysterectomy. Dr Amalia Hailey   Personal history of chemotherapy    ovarian cancer   Personal history of ovarian cancer    Port-A-Cath in place    Sinusitis    Tuberculosis    positve skin test    Past Surgical History:  Procedure Laterality  Date   ABDOMINAL HYSTERECTOMY  2003   complete Dr Amalia Hailey   ABSCESS DRAINAGE  2017   right buttock at [facility] EXCISIONAL BIOPSY Left 1993   excisional bx. negative results   BREAST SURGERY Left 1982   papilloma removal   COLONOSCOPY  2006, 2014   Dr Vira Agar    COLONOSCOPY N/A 03/27/2016   Procedure: COLONOSCOPY;  Surgeon: Robert Bellow, MD;  Location: [facility] ENDOSCOPY;  Service: Endoscopy;  Laterality: N/A;   COLOSTOMY REVERSAL  2014   at [place] Dr Raynaldo Opitz   NASAL SINUS SURGERY     For fungal infection   NASAL SINUS SURGERY  8/05 8/06   OOPHORECTOMY     OSTOMY  05/22/2012   diverticulitis with abcess/ Dr Tamala Julian   ovarian cancer     Exploratory lap   PORTA CATH INSERTION N/A 05/26/2018   Procedure: PORTA CATH INSERTION;  Surgeon: Algernon Huxley, MD;  Location: [facility];  Service: Cardiovascular;  Laterality: N/A;   TONSILLECTOMY AND ADENOIDECTOMY  1947    Family History  Problem Relation Age of Onset   Hypertension Mother    Coronary artery disease Mother    Heart disease Mother    Kidney disease Mother    Diabetes Mother    Cancer Mother        uterine cancer dx 57s  Diabetes Father    Kidney failure Father    Diabetes Brother    Coronary artery disease Brother    Cancer Brother        bladder cancer   Stroke Sister    Heart disease Sister        [name]   Cancer Maternal Aunt 49       ovarian cancer   Breast cancer Cousin    Cancer Brother        Prostate    Social History:  reports that she has never smoked. She has never used smokeless tobacco. She reports that she does not drink alcohol or use drugs.  Patient is a retired Engineer, site. Generally accompanied to appointments by friend, [name].  She lives in [place]. The patient is alone today.  Allergies: No Known Allergies  Current Medications: Current Outpatient Medications  Medication Sig Dispense Refill    amLODipine (NORVASC) 2.5 MG tablet TAKE 1 TABLET BY MOUTH EVERY DAY 30 tablet 2   Calcium Carbonate-Vitamin D 600-400 MG-UNIT tablet Take 1 tablet by mouth daily.      lidocaine-prilocaine (EMLA) cream Apply 1 application topically as needed. 30 g 5   Multiple Vitamins-Minerals (MULTIVITAMIN WITH MINERALS) tablet Take 1 tablet by mouth daily.     tamoxifen (NOLVADEX) 20 MG tablet Take 1 tablet (20 mg total) by mouth daily. 30 tablet 1   prochlorperazine (COMPAZINE) 10 MG tablet Take 1 tablet (10 mg total) by mouth every 6 (six) hours as needed for nausea or vomiting. (Patient not taking: Reported on 11/20/2018) 30 tablet 3   Current Facility-Administered Medications  Medication Dose Route Frequency Provider Last Rate Last Dose   lidocaine-prilocaine (EMLA) cream   Topical PRN Algernon Huxley, MD       Facility-Administered Medications Ordered in Other Visits  Medication Dose Route Frequency Provider Last Rate Last Dose   sodium chloride 0.9 % 50 mL with famotidine (PEPCID) 20 mg infusion  20 mg Intravenous Once Charlaine Dalton R, MD       sodium chloride flush (NS) 0.9 % injection 10 mL  10 mL Intravenous PRN Charlaine Dalton R, MD   10 mL at 06/24/18 0854   sodium chloride flush (NS) 0.9 % injection 10 mL  10 mL Intracatheter PRN Cammie Sickle, MD   10 mL at 07/01/18 0830    Review of Systems  Constitutional: Positive for malaise/fatigue and weight loss (2 lbs). Negative for chills, diaphoresis and fever.       Doing well.  HENT: Negative.  Negative for congestion, ear discharge, nosebleeds, sinus pain, sore throat and tinnitus.   Eyes: Negative.  Negative for blurred vision, double vision, photophobia and pain.  Respiratory: Negative.  Negative for cough, hemoptysis, sputum production, shortness of breath and wheezing.   Cardiovascular: Negative for chest pain, palpitations, orthopnea, leg swelling (left leg - chronic, improved) and PND.  Gastrointestinal: Negative.   Negative for abdominal pain, blood in stool, constipation, diarrhea, melena, nausea and vomiting.  Genitourinary: Negative.  Negative for dysuria, frequency, hematuria and urgency.  Musculoskeletal: Positive for joint pain (bilateral knees, worse in right knee). Negative for back pain, falls, myalgias and neck pain.  Skin: Negative for itching and rash.  Neurological: Positive for sensory change (numbness in right toe). Negative for dizziness, tingling, tremors, speech change, focal weakness, weakness and headaches.  Endo/Heme/Allergies: Negative.  Does not bruise/bleed easily.  Psychiatric/Behavioral: Negative for depression and memory loss. The patient is not nervous/anxious and does  not have insomnia.        Sleeps a lot.  All other systems reviewed and are negative.  Performance status (ECOG): 1  Vitals Blood pressure 128/61, pulse 73, temperature 98.8 F (37.1 C), temperature source Tympanic, resp. rate 18, height '5\' 3"'$  (1.6 m), weight 159 lb 9.8 oz (72.4 kg), SpO2 99 %.  Physical Exam  Constitutional: She is oriented to person, place, and time. She appears well-developed and well-nourished. No distress.  HENT:  Head: Normocephalic and atraumatic.  Mouth/Throat: Oropharynx is clear and moist. No oropharyngeal exudate.  Wearing a hat and mask.  Dentures.  Eyes: Pupils are equal, round, and reactive to light. Conjunctivae and EOM are normal. No scleral icterus.  Glasses.  Brown eyes.  Neck: Normal range of motion. Neck supple. No JVD present.  Cardiovascular: Normal rate, regular rhythm and normal heart sounds. Exam reveals no gallop and no friction rub.  No murmur heard. Pulmonary/Chest: Effort normal and breath sounds normal. No respiratory distress. She has no wheezes. She has no rales. She exhibits no tenderness.  Abdominal: Soft. Bowel sounds are normal. She exhibits no distension and no mass. There is no abdominal tenderness. There is no rebound and no guarding.    Musculoskeletal: Normal range of motion.        General: Edema (trace left ankle edema) present.  Lymphadenopathy:    She has no cervical adenopathy.    She has no axillary adenopathy.       Right: No supraclavicular adenopathy present.       Left: No supraclavicular adenopathy present.  Neurological: She is alert and oriented to person, place, and time. She has normal reflexes.  Skin: Skin is warm and dry. No rash noted. She is not diaphoretic. No erythema. No pallor.  Psychiatric: She has a normal mood and affect. Her behavior is normal. Judgment and thought content normal.  Nursing note and vitals reviewed.   Infusion on 01/08/2019  Component Date Value Ref Range Status   WBC 01/08/2019 9.0  4.0 - 10.5 K/uL Final   RBC 01/08/2019 3.93  3.87 - 5.11 MIL/uL Final   Hemoglobin 01/08/2019 11.9* 12.0 - 15.0 g/dL Final   HCT 01/08/2019 36.8  36.0 - 46.0 % Final   MCV 01/08/2019 93.6  80.0 - 100.0 fL Final   MCH 01/08/2019 30.3  26.0 - 34.0 pg Final   MCHC 01/08/2019 32.3  30.0 - 36.0 g/dL Final   RDW 01/08/2019 15.1  11.5 - 15.5 % Final   Platelets 01/08/2019 228  150 - 400 K/uL Final   nRBC 01/08/2019 0.0  0.0 - 0.2 % Final   Neutrophils Relative % 01/08/2019 64  % Final   Neutro Abs 01/08/2019 5.9  1.7 - 7.7 K/uL Final   Lymphocytes Relative 01/08/2019 25  % Final   Lymphs Abs 01/08/2019 2.2  0.7 - 4.0 K/uL Final   Monocytes Relative 01/08/2019 9  % Final   Monocytes Absolute 01/08/2019 0.8  0.1 - 1.0 K/uL Final   Eosinophils Relative 01/08/2019 1  % Final   Eosinophils Absolute 01/08/2019 0.1  0.0 - 0.5 K/uL Final   Basophils Relative 01/08/2019 1  % Final   Basophils Absolute 01/08/2019 0.1  0.0 - 0.1 K/uL Final   Immature Granulocytes 01/08/2019 0  % Final   Abs Immature Granulocytes 01/08/2019 0.04  0.00 - 0.07 K/uL Final   Performed at [facility], [address] [place], [place] [address]    Assessment:  [name] [name]  is a 85 y.o.  female with stage I Her2/neu + breast cancer s/p lumpectomy and sentinel lymph node biopsy on 03/18/2018. Pathologyrevealed a 0.6 cm grade III invasive mammary carcinoma. There was DCIS. There was no lymphovascular invasion. Tumor was ER + (99%), PR + (25%), and Her2/neu + by FISH. Pathologic stagewas T1bN0.  Screening mammogram on 01/17/2018 revealed calcifications and possible mass in the right breast. Diagnostic mammogram and ultrasoundon 01/22/2018 revealed indeterminate group of microcalcifications over the right upper outer quadrant spanning 4 x 9 x 11 mm. There was a 4 x 7 x 7 mm probable complicated cyst at the 11 o'clock position of the right breast 5 cm from the nipple.  Shereceived12 weeks ofHerceptin and Taxol (05/20/2018 - 08/05/2018).She has continued weekly Herceptin(09/05/2018; 09/26/2018 - 01/01/2019).Herceptin was held on06/09/2018.  Shereceivedwhole breastradiationfrom04/11/2018 - 11/05/2018.  Tamoxifen began 11/20/2018.  CA27.29 has been followed: 10.7 on 10/03/2018 and < 3.5 on 10/16/2018.  Echoat [facility] on 03/12/2018 revealed an EF of 61%. Echo at [facility] on03/02/2019 revealed an LVEF of 50 to 55%. There was a trivial pericardial effusion waspresent.[place] 11/12/2018 revealed an EFof 45-50%.  [place] 12/10/2018 revealed an EFof 55-60%.  He has a history of stage IIB ovarian cancer s/p exploratory laparotomy, TAH/BSO, omentectomy, peritoneal biopsies, selective pelvic and periaortic lymph node sampling on 06/20/2001. Pathologyrevealed a grade 1 papillary serous carcinoma of the ovary. Uterine serosa and the surface of the contralateral ovary were positive for metastatic disease. The primary tumor was approximately 15 cm in diameter. She received 6 cycles ofcarboplatin and Taxol.  Bone densityon 04/01/2017 revealed osteoporosiswith a T-score of -2.5 in the left femoral neck.  She has grade I neuropathyin her feet. She has chronic right  lower extremity edema. Right lower extremity duplexon 07/22/2018 revealed no femoropopliteal and no calf DVT in the visualized calf veins.  Symptomatically, she is doing well.  She denies any PND or orthopnea.  Exam is stable.  Plan: 1.   Labs today:  CBC with diff, CMP, Mg. 2.   Stage I HER-2/neu (+) breast cancer Clinically, she is doing well.  Radiation completed on 11/05/2018.   She continues tamoxifen (began 11/20/2018) Herceptin today and weekly x 3. Follow-up echo prior to next visit 3.Chemotherapy-induced neuropathy She has a mild neuropathy in her great toe.   Neuropathy is not affecting gait.   Continue to monitor. 4.Osteoporosis Continue calcium and vitamin D.   Dr. Nicki Reaper will be managing her osteoporosis. 5.Decline in ejection fraction (EF)             Echo on 08/18/2018 revealed an EF of 50-55%. Echo on 11/12/2018 revealed anEFof 45-50%. Echo on 12/10/2018 revealed an EF of 55-60%. Herceptin held x 1 month.             She is tolerating reinstitution of weekly Herceptin (last 01/01/2019). Echo prior to next visit. 6.RTC in 4 weeks for MD assessment, labs (CBC with diff, CMP, CA27.29), review of echo and Herceptin.  I discussed the assessment and treatment plan with the patient.  The patient was provided an opportunity to ask questions and all were answered.  The patient agreed with the plan and demonstrated an understanding of the instructions.  The patient was advised to call back if the symptoms worsen or if the condition fails to improve as anticipated.   [name] Asal, MD, PhD    01/08/2019, 2:01 PM  I, Selena Batten, am acting as scribe for [employer]. Mike Gip, MD, PhD.  I, [name] Larkin C. Mike Gip, MD,  have reviewed the above documentation for accuracy and completeness, and I agree with the above.

## 2019-01-08 ENCOUNTER — Inpatient Hospital Stay: Payer: Medicare HMO

## 2019-01-08 ENCOUNTER — Inpatient Hospital Stay (HOSPITAL_BASED_OUTPATIENT_CLINIC_OR_DEPARTMENT_OTHER): Payer: Medicare HMO | Admitting: Hematology and Oncology

## 2019-01-08 ENCOUNTER — Other Ambulatory Visit: Payer: Self-pay

## 2019-01-08 ENCOUNTER — Encounter: Payer: Self-pay | Admitting: Licensed Clinical Social Worker

## 2019-01-08 ENCOUNTER — Encounter: Payer: Self-pay | Admitting: Hematology and Oncology

## 2019-01-08 ENCOUNTER — Inpatient Hospital Stay (HOSPITAL_BASED_OUTPATIENT_CLINIC_OR_DEPARTMENT_OTHER): Payer: Medicare HMO | Admitting: Licensed Clinical Social Worker

## 2019-01-08 VITALS — BP 131/65 | HR 63 | Temp 97.8°F | Resp 18

## 2019-01-08 VITALS — BP 128/61 | HR 73 | Temp 98.8°F | Resp 18 | Ht 63.0 in | Wt 159.6 lb

## 2019-01-08 DIAGNOSIS — Z170 Estrogen receptor positive status [ER+]: Secondary | ICD-10-CM

## 2019-01-08 DIAGNOSIS — C50411 Malignant neoplasm of upper-outer quadrant of right female breast: Secondary | ICD-10-CM

## 2019-01-08 DIAGNOSIS — Z8049 Family history of malignant neoplasm of other genital organs: Secondary | ICD-10-CM

## 2019-01-08 DIAGNOSIS — G620 Drug-induced polyneuropathy: Secondary | ICD-10-CM | POA: Diagnosis not present

## 2019-01-08 DIAGNOSIS — Z5112 Encounter for antineoplastic immunotherapy: Secondary | ICD-10-CM

## 2019-01-08 DIAGNOSIS — Z8543 Personal history of malignant neoplasm of ovary: Secondary | ICD-10-CM

## 2019-01-08 DIAGNOSIS — M810 Age-related osteoporosis without current pathological fracture: Secondary | ICD-10-CM

## 2019-01-08 DIAGNOSIS — R931 Abnormal findings on diagnostic imaging of heart and coronary circulation: Secondary | ICD-10-CM

## 2019-01-08 DIAGNOSIS — R600 Localized edema: Secondary | ICD-10-CM | POA: Diagnosis not present

## 2019-01-08 DIAGNOSIS — Z803 Family history of malignant neoplasm of breast: Secondary | ICD-10-CM

## 2019-01-08 DIAGNOSIS — Z8042 Family history of malignant neoplasm of prostate: Secondary | ICD-10-CM

## 2019-01-08 DIAGNOSIS — Z8041 Family history of malignant neoplasm of ovary: Secondary | ICD-10-CM

## 2019-01-08 DIAGNOSIS — T451X5A Adverse effect of antineoplastic and immunosuppressive drugs, initial encounter: Secondary | ICD-10-CM

## 2019-01-08 LAB — COMPREHENSIVE METABOLIC PANEL
ALT: 19 U/L (ref 0–44)
AST: 29 U/L (ref 15–41)
Albumin: 3.6 g/dL (ref 3.5–5.0)
Alkaline Phosphatase: 67 U/L (ref 38–126)
Anion gap: 9 (ref 5–15)
BUN: 19 mg/dL (ref 8–23)
CO2: 23 mmol/L (ref 22–32)
Calcium: 9 mg/dL (ref 8.9–10.3)
Chloride: 104 mmol/L (ref 98–111)
Creatinine, Ser: 0.59 mg/dL (ref 0.44–1.00)
GFR calc Af Amer: >60 mL/min (ref >60)
GFR calc non Af Amer: >60 mL/min (ref >60)
Glucose, Bld: 129 mg/dL — ABNORMAL HIGH (ref 70–99)
Potassium: 3.5 mmol/L (ref 3.5–5.1)
Sodium: 136 mmol/L (ref 135–145)
Total Bilirubin: 0.4 mg/dL (ref 0.3–1.2)
Total Protein: 6.4 g/dL — ABNORMAL LOW (ref 6.5–8.1)

## 2019-01-08 LAB — CBC WITH DIFFERENTIAL/PLATELET
Abs Immature Granulocytes: 0.04 10*3/uL (ref 0.00–0.07)
Basophils Absolute: 0.1 10*3/uL (ref 0.0–0.1)
Basophils Relative: 1 %
Eosinophils Absolute: 0.1 10*3/uL (ref 0.0–0.5)
Eosinophils Relative: 1 %
HCT: 36.8 % (ref 36.0–46.0)
Hemoglobin: 11.9 g/dL — ABNORMAL LOW (ref 12.0–15.0)
Immature Granulocytes: 0 %
Lymphocytes Relative: 25 %
Lymphs Abs: 2.2 10*3/uL (ref 0.7–4.0)
MCH: 30.3 pg (ref 26.0–34.0)
MCHC: 32.3 g/dL (ref 30.0–36.0)
MCV: 93.6 fL (ref 80.0–100.0)
Monocytes Absolute: 0.8 10*3/uL (ref 0.1–1.0)
Monocytes Relative: 9 %
Neutro Abs: 5.9 10*3/uL (ref 1.7–7.7)
Neutrophils Relative %: 64 %
Platelets: 228 10*3/uL (ref 150–400)
RBC: 3.93 MIL/uL (ref 3.87–5.11)
RDW: 15.1 % (ref 11.5–15.5)
WBC: 9 10*3/uL (ref 4.0–10.5)
nRBC: 0 % (ref 0.0–0.2)

## 2019-01-08 LAB — MAGNESIUM: Magnesium: 2 mg/dL (ref 1.7–2.4)

## 2019-01-08 MED ORDER — TRASTUZUMAB CHEMO 150 MG IV SOLR
150.0000 mg | Freq: Once | INTRAVENOUS | Status: AC
  Administered 2019-01-08: 150 mg via INTRAVENOUS
  Filled 2019-01-08: qty 7.14

## 2019-01-08 MED ORDER — ACETAMINOPHEN 325 MG PO TABS
650.0000 mg | ORAL_TABLET | Freq: Once | ORAL | Status: AC
  Administered 2019-01-08: 650 mg via ORAL
  Filled 2019-01-08: qty 2

## 2019-01-08 MED ORDER — SODIUM CHLORIDE 0.9% FLUSH
10.0000 mL | INTRAVENOUS | Status: DC | PRN
  Administered 2019-01-08: 10 mL
  Filled 2019-01-08: qty 10

## 2019-01-08 MED ORDER — SODIUM CHLORIDE 0.9 % IV SOLN
Freq: Once | INTRAVENOUS | Status: AC
  Administered 2019-01-08: 15:00:00 via INTRAVENOUS
  Filled 2019-01-08: qty 250

## 2019-01-08 MED ORDER — HEPARIN SOD (PORK) LOCK FLUSH 100 UNIT/ML IV SOLN
500.0000 [IU] | Freq: Once | INTRAVENOUS | Status: AC | PRN
  Administered 2019-01-08: 500 [IU]
  Filled 2019-01-08: qty 5

## 2019-01-08 NOTE — Progress Notes (Signed)
REFERRING PROVIDER: Lequita Asal, MD [place],  [place] [address]  PRIMARY PROVIDER:  Einar Pheasant, MD  PRIMARY REASON FOR VISIT:  1. Carcinoma of upper-outer quadrant of right breast in female, estrogen receptor positive ([place])   2. Family history of breast cancer   3. Family history of ovarian cancer   4. Family history of prostate cancer   5. Family history of uterine cancer   6. Personal history of ovarian cancer      HISTORY OF PRESENT ILLNESS:   Ms. Level, a 85 y.o. female, was seen for a [facility] cancer genetics consultation at the request of Dr. Mike Gip due to a personal and family history of cancer.  Ms. Witters presents to clinic today to discuss the possibility of a hereditary predisposition to cancer, genetic testing, and to further clarify her future cancer risks, as well as potential cancer risks for family members.    At the age of 76, Ms. Zufall was diagnosed with ovarian cancer. This was treated with TAH-BSO and chemotherapy.   In 2019, at the age of 54, Ms. Ganser was diagnosed with breast cancer, ER/PR+, Her2-. This was treated with lumpectomy, radiation and chemotherapy.  CANCER HISTORY:  Oncology History Overview Note  # OCT 2019- RUOQ IMC-stage I [lumpectomy sentinel lymph node biopsy [facility]; T=0.6 cm; N=0] ER/PR-POSITIVE;her 2 neu-AMPLIFIED [2.28]  # Dec 10th 2019- Taxol-Herceptin weekly x12.   # Oct 2nd EF-55-60% [[facility]]  # Ovarian cancer 2002; s/p carboTaxol x6 [[place]];  Ovarian cancer treated in 2003, she underwent exploratory laparotomy, TAH/BSO, omentectomy, peritoneal biopsies, selective pelvic and periaortic lymph node sampling in [place] for a Grade 1 papillary serous carcinoma of the ovary on June 20, 2001. Last tine she saw GYN oncology was 2011 (Dr. Erasmo Leventhal)  # Genetic testing/ #Fhx of cancer- No children; 2 brothers- cancers [oldest brother- bladder; other brother- prostate cancer ]; mom-? Gynecologic;mom'  sister-died of ovarian cancer. Awaiting genetics evaluation.  DIAGNOSIS: Breast cancer  STAGE:  I  ;GOALS: cure  CURRENT/MOST RECENT THERAPY: Taxol+herceptin    Carcinoma of upper-outer quadrant of right breast in female, estrogen receptor positive ([employer])     RISK FACTORS:  Menarche was at age 68.  First live birth at age no children OCP use for approximately 6 months.  Ovaries intact: no  Hysterectomy: yes Menopausal status: postmenopausal. Colonoscopy: yes;normal  Past Medical History:  Diagnosis Date  . Diverticulitis of sigmoid colon   . Endometriosis   . Family history of breast cancer   . Family history of ovarian cancer   . Family history of prostate cancer   . Family history of uterine cancer   . Fibrocystic breast disease   . Glaucoma   . H/O urinary incontinence   . History of chicken pox   . History of colon polyps 2014  . Hyperlipidemia   . Hypertension   . Ovarian cancer ([place]) 2002   Chemo tx's @ [facility] with Total Hysterectomy. Dr Amalia Hailey  . Personal history of chemotherapy    ovarian cancer  . Personal history of ovarian cancer   . Port-A-Cath in place   . Sinusitis   . Tuberculosis    positve skin test    Past Surgical History:  Procedure Laterality Date  . ABDOMINAL HYSTERECTOMY  2003   complete Dr Amalia Hailey  . ABSCESS DRAINAGE  2017   right buttock [facility]  . APPENDECTOMY  2003  . BREAST EXCISIONAL BIOPSY Left 1993   excisional bx. negative results  .  BREAST SURGERY Left 1982   papilloma removal  . COLONOSCOPY  2006, 2014   Dr Vira Agar   . COLONOSCOPY N/A 03/27/2016   Procedure: COLONOSCOPY;  Surgeon: Robert Bellow, MD;  Location: [facility] ENDOSCOPY;  Service: Endoscopy;  Laterality: N/A;  . COLOSTOMY REVERSAL  2014   at [place] Dr Raynaldo Opitz  . NASAL SINUS SURGERY     For fungal infection  . NASAL SINUS SURGERY  8/05 8/06  . OOPHORECTOMY    . OSTOMY  05/22/2012   diverticulitis with abcess/ Dr Tamala Julian  . ovarian cancer     Exploratory lap   . PORTA CATH INSERTION N/A 05/26/2018   Procedure: PORTA CATH INSERTION;  Surgeon: Algernon Huxley, MD;  Location: [place] CV LAB;  Service: Cardiovascular;  Laterality: N/A;  . TONSILLECTOMY AND ADENOIDECTOMY  1947    Social History   Socioeconomic History  . Marital status: Widowed    Spouse name: Not on file  . Number of children: 0  . Years of education: Not on file  . Highest education level: Not on file  Occupational History  . Not on file  Social Needs  . Financial resource strain: Not hard at all  . Food insecurity    Worry: Never true    Inability: Never true  . Transportation needs    Medical: No    Non-medical: No  Tobacco Use  . Smoking status: Never Smoker  . Smokeless tobacco: Never Used  Substance and Sexual Activity  . Alcohol use: No    Alcohol/week: 0.0 standard drinks  . Drug use: No  . Sexual activity: Not Currently  Lifestyle  . Physical activity    Days per week: Not on file    Minutes per session: Not on file  . Stress: Not on file  Relationships  . Social Herbalist on phone: Not on file    Gets together: Not on file    Attends religious service: Not on file    Active member of club or organization: Not on file    Attends meetings of clubs or organizations: Not on file    Relationship status: Not on file  Other Topics Concern  . Not on file  Social History Narrative  . Not on file     FAMILY HISTORY:  We obtained a detailed, 4-generation family history.  Significant diagnoses are listed below: Family History  Problem Relation Age of Onset  . Hypertension Mother   . Coronary artery disease Mother   . Heart disease Mother   . Kidney disease Mother   . Diabetes Mother   . Cancer Mother        uterine cancer dx 34s  . Diabetes Father   . Kidney failure Father   . Diabetes Brother   . Coronary artery disease Brother   . Cancer Brother        bladder cancer  . Stroke Sister   . Heart disease Sister        [facility]   . Cancer Maternal Aunt 49       ovarian cancer  . Breast cancer Cousin   . Cancer Brother        Prostate    Ms. Sleep does not have children. She had 3 sisters and 3 brothers. One of her brothers had bladder cancer and is deceased. Another brother was diagnosed with prostate cancer at 76 and is living at 84. A sister died at 56, her other two  are living.  Ms. Delpino mother was diagnosed with uterine cancer in her 80s and died at 43. Patient had 1 maternal aunt and several maternal half aunts/uncles. Her full maternal aunt had ovarian cancer at 65 and died at 41. This aunt had 10 children and one of her daughters had breast cancer. One of Ms. Puopolo's half-uncles had cancer but she is unsure the type. Her maternal grandmother died at 31 and grandfather died young as well.   Ms. Burgard's father died at 74, no cancers. She had 4 paternal uncles and 1 paternal aunt. She notes that two sisters married two brothers, so her mother's sister married one of her father's brothers. No known cancers in paternal cousins. Her paternal grandmother died at 26, no info about paternal grandfather.   Ms. Whitmore is unaware of previous family history of genetic testing for hereditary cancer risks. Patient's maternal ancestors are of [place] descent, and paternal ancestors are of English descent. There is no reported Ashkenazi Jewish ancestry. There is no known consanguinity.  GENETIC COUNSELING ASSESSMENT: Ms. Greenlaw is a 85 y.o. female with a personal and family history which is somewhat suggestive of a hereditary cancer syndrome and predisposition to cancer. We, therefore, discussed and recommended the following at today's visit.   DISCUSSION: We discussed that 5 - 10% of breast cancer is hereditary and about 15-25% of ovarian cancer is hereditary, with most cases associated with BRCA1/BRCA2.  There are other genes that can be associated with hereditary breast and ovarian cancer syndromes.  We  discussed that testing is beneficial for several reasons including knowing how to follow individuals after completing their treatment, and understand if other family members could be at risk for cancer and allow them to undergo genetic testing.   We reviewed the characteristics, features and inheritance patterns of hereditary cancer syndromes. We also discussed genetic testing, including the appropriate family members to test, the process of testing, insurance coverage and turn-around-time for results. We discussed the implications of a negative, positive and/or variant of uncertain significant result. We recommended Ms. Reinoso pursue genetic testing for the Common Hereditary Cancers gene panel.   The Common Hereditary Cancers Panel offered by [facility] includes sequencing and/or deletion duplication testing of the following 48 genes: APC, ATM, AXIN2, BARD1, BMPR1A, BRCA1, BRCA2, BRIP1, CDH1, CDKN2A (p14ARF), CDKN2A (p16INK4a), CKD4, CHEK2, CTNNA1, DICER1, EPCAM (Deletion/duplication testing only), GREM1 (promoter region deletion/duplication testing only), KIT, MEN1, MLH1, MSH2, MSH3, MSH6, MUTYH, NBN, NF1, NHTL1, PALB2, PDGFRA, PMS2, POLD1, POLE, PTEN, RAD50, RAD51C, RAD51D, RNF43, SDHB, SDHC, SDHD, SMAD4, SMARCA4. STK11, TP53, TSC1, TSC2, and VHL.  The following genes were evaluated for sequence changes only: SDHA and HOXB13 c.251G>A variant only.  Based on Ms. Saline's personal and family history of cancer, she meets medical criteria for genetic testing. Despite that she meets criteria, she may still have an out of pocket cost.   PLAN: After considering the risks, benefits, and limitations, Ms. Swoyer provided informed consent to pursue genetic testing and the blood sample was sent to [facility] for analysis of the Common Hereditary Cancers Panel. Results should be available within approximately 2-3 weeks' time, at which point they will be disclosed by telephone to Ms. Romanoski, as will any  additional recommendations warranted by these results. Ms. Ballew will receive a summary of her genetic counseling visit and a copy of her results once available. This information will also be available in Epic.   Based on Ms. Vorhees's family history, we recommended her maternal relatives have  genetic counseling and testing. Ms. Narula will let us know if we can be of any assistance in coordinating genetic counseling and/or testing for these family members.    Lastly, we encouraged Ms. Hunzeker to remain in contact with cancer genetics annually so that we can continuously update the family history and inform her of any changes in cancer genetics and testing that may be of benefit for this family.    Ms. Herard questions were answered to her satisfaction today. Our contact information was provided should additional questions or concerns arise. Thank you for the referral and allowing [place] to share in the care of your patient.   Faith Rogue, MS, [place] Genetic Counselor [email or web address] Phone: [phone]  The patient was seen for a total of 30 minutes in face-to-face genetic counseling.  Dr. Grayland Ormond was available for discussion regarding this case.   _______________________________________________________________________ For Office Staff:  Number of people involved in session: 1 Was an Intern/ student involved with case: no

## 2019-01-08 NOTE — Progress Notes (Signed)
Benadryl held as a pre med today due to complaints of sleepiness after doses per MD. Tolerated Herceptin well. No complaints of any discomfort at time of discharge.

## 2019-01-08 NOTE — Progress Notes (Signed)
No new changes noted today 

## 2019-01-10 ENCOUNTER — Other Ambulatory Visit: Payer: Self-pay | Admitting: Hematology and Oncology

## 2019-01-10 DIAGNOSIS — C50411 Malignant neoplasm of upper-outer quadrant of right female breast: Secondary | ICD-10-CM

## 2019-01-10 DIAGNOSIS — Z170 Estrogen receptor positive status [ER+]: Secondary | ICD-10-CM

## 2019-01-10 NOTE — Progress Notes (Signed)
ON PATHWAY REGIMEN - Breast  No Change  Continue With Treatment as Ordered.   Paclitaxel Weekly + Trastuzumab Weekly:   Administer weekly:     Paclitaxel      Trastuzumab-xxxx      Trastuzumab-xxxx   **Always confirm dose/schedule in your pharmacy ordering system**  Trastuzumab (Maintenance - NO Loading Dose):   A cycle is every 21 days:     Trastuzumab-xxxx   **Always confirm dose/schedule in your pharmacy ordering system**  Patient Characteristics: Postoperative without Neoadjuvant Therapy (Pathologic Staging), Invasive Disease, Adjuvant Therapy, HER2 Positive, ER Positive, Node Negative, pT1c, pN0/N69m Therapeutic Status: Postoperative without Neoadjuvant Therapy (Pathologic Staging) AJCC Grade: G3 AJCC N Category: pN0 AJCC M Category: cM0 ER Status: Positive (+) AJCC 8 Stage Grouping: IA HER2 Status: Positive (+) Oncotype Dx Recurrence Score: Not Appropriate AJCC T Category: pT1 PR Status: Positive (+) Intent of Therapy: Curative Intent, Discussed with Patient

## 2019-01-14 ENCOUNTER — Other Ambulatory Visit: Payer: Self-pay | Admitting: Hematology and Oncology

## 2019-01-14 DIAGNOSIS — C50411 Malignant neoplasm of upper-outer quadrant of right female breast: Secondary | ICD-10-CM

## 2019-01-15 ENCOUNTER — Inpatient Hospital Stay: Payer: Medicare HMO | Attending: Hematology and Oncology

## 2019-01-15 ENCOUNTER — Other Ambulatory Visit: Payer: Self-pay | Admitting: Hematology and Oncology

## 2019-01-15 ENCOUNTER — Other Ambulatory Visit: Payer: Self-pay

## 2019-01-15 VITALS — BP 125/68 | HR 59 | Temp 97.8°F | Resp 18 | Wt 159.8 lb

## 2019-01-15 DIAGNOSIS — Z170 Estrogen receptor positive status [ER+]: Secondary | ICD-10-CM | POA: Diagnosis present

## 2019-01-15 DIAGNOSIS — Z5111 Encounter for antineoplastic chemotherapy: Secondary | ICD-10-CM | POA: Insufficient documentation

## 2019-01-15 DIAGNOSIS — C50411 Malignant neoplasm of upper-outer quadrant of right female breast: Secondary | ICD-10-CM | POA: Insufficient documentation

## 2019-01-15 MED ORDER — TRASTUZUMAB CHEMO 150 MG IV SOLR: 2.0000 mg/kg | Freq: Once | INTRAVENOUS | Status: DC

## 2019-01-15 MED ORDER — SODIUM CHLORIDE 0.9 % IV SOLN
Freq: Once | INTRAVENOUS | Status: AC
  Administered 2019-01-15: 14:00:00 via INTRAVENOUS
  Filled 2019-01-15: qty 250

## 2019-01-15 MED ORDER — HEPARIN SOD (PORK) LOCK FLUSH 100 UNIT/ML IV SOLN
500.0000 [IU] | Freq: Once | INTRAVENOUS | Status: AC | PRN
  Administered 2019-01-15: 500 [IU]

## 2019-01-15 MED ORDER — ACETAMINOPHEN 325 MG PO TABS
650.0000 mg | ORAL_TABLET | Freq: Once | ORAL | Status: AC
  Administered 2019-01-15: 650 mg via ORAL

## 2019-01-15 MED ORDER — SODIUM CHLORIDE 0.9% FLUSH
10.0000 mL | INTRAVENOUS | Status: DC | PRN
  Administered 2019-01-15: 10 mL
  Filled 2019-01-15: qty 10

## 2019-01-15 MED ORDER — ACETAMINOPHEN 325 MG PO TABS: 650.0000 mg | ORAL_TABLET | Freq: Once | ORAL | Status: DC

## 2019-01-15 MED ORDER — TRASTUZUMAB-ANNS CHEMO 150 MG IV SOLR
150.0000 mg | Freq: Once | INTRAVENOUS | Status: AC
  Administered 2019-01-15: 150 mg via INTRAVENOUS
  Filled 2019-01-15: qty 7.14

## 2019-01-15 MED ORDER — SODIUM CHLORIDE 0.9 % IV SOLN
Freq: Once | INTRAVENOUS | Status: DC
  Filled 2019-01-15: qty 250

## 2019-01-16 ENCOUNTER — Ambulatory Visit: Payer: Self-pay | Admitting: Licensed Clinical Social Worker

## 2019-01-16 ENCOUNTER — Telehealth: Payer: Self-pay | Admitting: Licensed Clinical Social Worker

## 2019-01-16 ENCOUNTER — Encounter: Payer: Self-pay | Admitting: Licensed Clinical Social Worker

## 2019-01-16 DIAGNOSIS — Z803 Family history of malignant neoplasm of breast: Secondary | ICD-10-CM

## 2019-01-16 DIAGNOSIS — Z8042 Family history of malignant neoplasm of prostate: Secondary | ICD-10-CM

## 2019-01-16 DIAGNOSIS — Z1379 Encounter for other screening for genetic and chromosomal anomalies: Secondary | ICD-10-CM

## 2019-01-16 DIAGNOSIS — Z8041 Family history of malignant neoplasm of ovary: Secondary | ICD-10-CM

## 2019-01-16 DIAGNOSIS — C50411 Malignant neoplasm of upper-outer quadrant of right female breast: Secondary | ICD-10-CM

## 2019-01-16 DIAGNOSIS — Z8049 Family history of malignant neoplasm of other genital organs: Secondary | ICD-10-CM

## 2019-01-16 DIAGNOSIS — Z8543 Personal history of malignant neoplasm of ovary: Secondary | ICD-10-CM

## 2019-01-16 NOTE — Progress Notes (Signed)
HPI:  Ms. Fesler was previously seen in the [place] clinic due to a personal and family history of cancer and concerns regarding a hereditary predisposition to cancer. Please refer to our prior cancer genetics clinic note for more information regarding our discussion, assessment and recommendations, at the time. Ms. Hurrell recent genetic test results were disclosed to her, as were recommendations warranted by these results. These results and recommendations are discussed in more detail below.  CANCER HISTORY:  Oncology History Overview Note  # OCT 2019- RUOQ IMC-stage I [lumpectomy sentinel lymph node biopsy [facility]; T=0.6 cm; N=0] ER/PR-POSITIVE;her 2 neu-AMPLIFIED [2.28]  # Dec 10th 2019- Taxol-Herceptin weekly x12.   # Oct 2nd EF-55-60% [[facility]]  # Ovarian cancer 2002; s/p carboTaxol x6 [[place]];  Ovarian cancer treated in 2003, she underwent exploratory laparotomy, TAH/BSO, omentectomy, peritoneal biopsies, selective pelvic and periaortic lymph node sampling in [place] for a Grade 1 papillary serous carcinoma of the ovary on June 20, 2001. Last tine she saw GYN oncology was 2011 (Dr. Erasmo Leventhal)  # Genetic testing/ #Fhx of cancer- No children; 2 brothers- cancers [oldest brother- bladder; other brother- prostate cancer ]; mom-? Gynecologic;mom' sister-died of ovarian cancer. Awaiting genetics evaluation.  DIAGNOSIS: Breast cancer  STAGE:  I  ;GOALS: cure  CURRENT/MOST RECENT THERAPY: Taxol+herceptin    Carcinoma of upper-outer quadrant of right breast in female, estrogen receptor positive ([place])  01/15/2019 -  Chemotherapy   The patient had trastuzumab-anns (KANJINTI) 150 mg in sodium chloride 0.9 % 250 mL chemo infusion, 147 mg, Intravenous,  Once, 1 of 6 cycles Administration: 150 mg (01/15/2019)  for chemotherapy treatment.      FAMILY HISTORY:  We obtained a detailed, 4-generation family history.  Significant diagnoses are listed below: Family History   Problem Relation Age of Onset  . Hypertension Mother   . Coronary artery disease Mother   . Heart disease Mother   . Kidney disease Mother   . Diabetes Mother   . Cancer Mother        uterine cancer dx 28s  . Diabetes Father   . Kidney failure Father   . Diabetes Brother   . Coronary artery disease Brother   . Cancer Brother        bladder cancer  . Stroke Sister   . Heart disease Sister        [facility]  . Cancer Maternal Aunt 49       ovarian cancer  . Breast cancer Cousin   . Cancer Brother        Prostate    Ms. Borman does not have children. She had 3 sisters and 3 brothers. One of her brothers had bladder cancer and is deceased. Another brother was diagnosed with prostate cancer at 36 and is living at 60. A sister died at 68, her other two are living.  Ms. Chervenak mother was diagnosed with uterine cancer in her 76s and died at 32. Patient had 1 maternal aunt and several maternal half aunts/uncles. Her full maternal aunt had ovarian cancer at 3 and died at 55. This aunt had 10 children and one of her daughters had breast cancer. One of Ms. Melander's half-uncles had cancer but she is unsure the type. Her maternal grandmother died at 75 and grandfather died young as well.   Ms. Perren's father died at 1, no cancers. She had 4 paternal uncles and 1 paternal aunt. She notes that two sisters married two brothers, so her mother's sister married one  of her father's brothers. No known cancers in paternal cousins. Her paternal grandmother died at 46, no info about paternal grandfather.   Ms. Meador is unaware of previous family history of genetic testing for hereditary cancer risks. Patient's maternal ancestors are of [place] descent, and paternal ancestors are of English descent. There is no reported Ashkenazi Jewish ancestry. There is no known consanguinity.  GENETIC TEST RESULTS: Genetic testing reported out on 01/14/2019 through the [facility] Common Hereditary cancer  panel found no pathogenic mutations. The Common Hereditary Cancers Panel offered by [facility] includes sequencing and/or deletion duplication testing of the following 48 genes: APC, ATM, AXIN2, BARD1, BMPR1A, BRCA1, BRCA2, BRIP1, CDH1, CDKN2A (p14ARF), CDKN2A (p16INK4a), CKD4, CHEK2, CTNNA1, DICER1, EPCAM (Deletion/duplication testing only), GREM1 (promoter region deletion/duplication testing only), KIT, MEN1, MLH1, MSH2, MSH3, MSH6, MUTYH, NBN, NF1, NHTL1, PALB2, PDGFRA, PMS2, POLD1, POLE, PTEN, RAD50, RAD51C, RAD51D, RNF43, SDHB, SDHC, SDHD, SMAD4, SMARCA4. STK11, TP53, TSC1, TSC2, and VHL.  The following genes were evaluated for sequence changes only: SDHA and HOXB13 c.251G>A variant only. The test report has been scanned into EPIC and is located under the Molecular Pathology section of the Results Review tab.  A portion of the result report is included below for reference.     We discussed with Ms. Mccall that because current genetic testing is not perfect, it is possible there may be a gene mutation in one of these genes that current testing cannot detect, but that chance is small.  We also discussed, that there could be another gene that has not yet been discovered, or that we have not yet tested, that is responsible for the cancer diagnoses in the family. It is also possible there is a hereditary cause for the cancer in the family that Ms. Osorno did not inherit and therefore was not identified in her testing.  Therefore, it is important to remain in touch with cancer genetics in the future so that we can continue to offer Ms. Olano the most up to date genetic testing.   ADDITIONAL GENETIC TESTING: We discussed with Ms. Dobosz that her genetic testing was fairly extensive.  If there are genes identified to increase cancer risk that can be analyzed in the future, we would be happy to discuss and coordinate this testing at that time.    CANCER SCREENING RECOMMENDATIONS: Ms. Dea test result is  considered negative (normal).  This means that we have not identified a hereditary cause for her  personal and family history of cancer at this time. Most cancers happen by chance and this negative test suggests that her cancer may fall into this category.    While reassuring, this does not definitively rule out a hereditary predisposition to cancer. It is still possible that there could be genetic mutations that are undetectable by current technology. There could be genetic mutations in genes that have not been tested or identified to increase cancer risk.  Therefore, it is recommended she continue to follow the cancer management and screening guidelines provided by her oncology and primary healthcare provider.   Given Ms. Batten's personal and family histories, we must interpret these negative results with some caution.  Families with features suggestive of hereditary risk for cancer tend to have multiple family members with cancer, diagnoses in multiple generations and diagnoses before the age of 31. Ms. Reitter's family exhibits some of these features. Thus, this result may simply reflect our current inability to detect all mutations within these genes or there may be a different  gene that has not yet been discovered or tested.   An individual's cancer risk and medical management are not determined by genetic test results alone. Overall cancer risk assessment incorporates additional factors, including personal medical history, family history, and any available genetic information that may result in a personalized plan for cancer prevention and surveillance  RECOMMENDATIONS FOR FAMILY MEMBERS:  Relatives in this family might be at some increased risk of developing cancer, over the general population risk, simply due to the family history of cancer.  We recommended female relatives in this family have a yearly mammogram beginning at age 19, or 76 years younger than the earliest onset of cancer, an annual  clinical breast exam, and perform monthly breast self-exams. Female relatives in this family should also have a gynecological exam as recommended by their primary provider. All family members should have a colonoscopy by age 4, or as directed by their physicians.   It is also possible there is a hereditary cause for the cancer in Ms. Fines's family that she did not inherit and therefore was not identified in her.  Based on Ms. Bowie's family history, we recommended maternal relatives have genetic counseling and testing. Ms. Pleitez will let us know if we can be of any assistance in coordinating genetic counseling and/or testing for these family members.  FOLLOW-UP: Lastly, we discussed with Ms. Lien that cancer genetics is a rapidly advancing field and it is possible that new genetic tests will be appropriate for her and/or her family members in the future. We encouraged her to remain in contact with cancer genetics on an annual basis so we can update her personal and family histories and let her know of advances in cancer genetics that may benefit this family.   Our contact number was provided. Ms. Silos questions were answered to her satisfaction, and she knows she is welcome to call us at anytime with additional questions or concerns.   Faith Rogue, MS, [facility] Genetic Counselor [email or web address] Phone: [phone]

## 2019-01-16 NOTE — Telephone Encounter (Signed)
Revealed negative genetic testing.  This normal result is reassuring and indicates that it is unlikely [name] [name]'s cancer is due to a hereditary cause.  It is unlikely that there is an increased risk of another cancer due to a mutation in one of these genes.  However, genetic testing is not perfect, and cannot definitively rule out a hereditary cause.  It will be important for her to keep in contact with genetics to learn if any additional testing may be needed in the future.

## 2019-01-21 ENCOUNTER — Other Ambulatory Visit: Payer: Self-pay

## 2019-01-21 NOTE — Telephone Encounter (Signed)
Please notify pt

## 2019-01-21 NOTE — Telephone Encounter (Signed)
Patient aware.

## 2019-01-21 NOTE — Telephone Encounter (Signed)
Patient approved for Prolia scheduled first injection for 01/26/19 [facility]

## 2019-01-22 ENCOUNTER — Inpatient Hospital Stay: Payer: Medicare HMO

## 2019-01-22 ENCOUNTER — Other Ambulatory Visit: Payer: Self-pay | Admitting: Hematology and Oncology

## 2019-01-22 VITALS — BP 119/70 | HR 60 | Temp 97.8°F | Resp 18

## 2019-01-22 DIAGNOSIS — Z5111 Encounter for antineoplastic chemotherapy: Secondary | ICD-10-CM | POA: Diagnosis not present

## 2019-01-22 DIAGNOSIS — C50411 Malignant neoplasm of upper-outer quadrant of right female breast: Secondary | ICD-10-CM | POA: Diagnosis not present

## 2019-01-22 DIAGNOSIS — Z170 Estrogen receptor positive status [ER+]: Secondary | ICD-10-CM | POA: Diagnosis not present

## 2019-01-22 MED ORDER — SODIUM CHLORIDE 0.9% FLUSH
10.0000 mL | INTRAVENOUS | Status: DC | PRN
  Administered 2019-01-22: 14:00:00 10 mL
  Filled 2019-01-22: qty 10

## 2019-01-22 MED ORDER — ACETAMINOPHEN 325 MG PO TABS
650.0000 mg | ORAL_TABLET | Freq: Once | ORAL | Status: AC
  Administered 2019-01-22: 650 mg via ORAL

## 2019-01-22 MED ORDER — TRASTUZUMAB-ANNS CHEMO 150 MG IV SOLR
150.0000 mg | Freq: Once | INTRAVENOUS | Status: AC
  Administered 2019-01-22: 150 mg via INTRAVENOUS
  Filled 2019-01-22: qty 7.14

## 2019-01-22 MED ORDER — HEPARIN SOD (PORK) LOCK FLUSH 100 UNIT/ML IV SOLN
500.0000 [IU] | Freq: Once | INTRAVENOUS | Status: AC | PRN
  Administered 2019-01-22: 500 [IU]

## 2019-01-22 MED ORDER — SODIUM CHLORIDE 0.9 % IV SOLN
Freq: Once | INTRAVENOUS | Status: AC
  Administered 2019-01-22: 14:00:00 via INTRAVENOUS
  Filled 2019-01-22: qty 250

## 2019-01-26 ENCOUNTER — Ambulatory Visit (INDEPENDENT_AMBULATORY_CARE_PROVIDER_SITE_OTHER): Payer: Medicare HMO | Admitting: *Deleted

## 2019-01-26 ENCOUNTER — Other Ambulatory Visit: Payer: Self-pay

## 2019-01-26 DIAGNOSIS — M810 Age-related osteoporosis without current pathological fracture: Secondary | ICD-10-CM | POA: Diagnosis not present

## 2019-01-26 MED ORDER — DENOSUMAB 60 MG/ML ~~LOC~~ SOSY
60.0000 mg | PREFILLED_SYRINGE | Freq: Once | SUBCUTANEOUS | Status: AC
  Administered 2019-01-26: 60 mg via SUBCUTANEOUS

## 2019-01-26 NOTE — Progress Notes (Addendum)
Patient presented for Prolia injection to Left arm [place], patient voiced no concerns or complaints during or after injection.  Reviewed.  Dr Nicki Reaper

## 2019-01-29 ENCOUNTER — Inpatient Hospital Stay: Payer: Medicare HMO

## 2019-01-29 ENCOUNTER — Other Ambulatory Visit: Payer: Self-pay

## 2019-01-29 ENCOUNTER — Other Ambulatory Visit: Payer: Self-pay | Admitting: Hematology and Oncology

## 2019-01-29 VITALS — BP 120/68 | HR 64 | Temp 97.2°F | Resp 18 | Wt 165.3 lb

## 2019-01-29 DIAGNOSIS — C50411 Malignant neoplasm of upper-outer quadrant of right female breast: Secondary | ICD-10-CM | POA: Diagnosis not present

## 2019-01-29 DIAGNOSIS — Z5111 Encounter for antineoplastic chemotherapy: Secondary | ICD-10-CM | POA: Diagnosis not present

## 2019-01-29 DIAGNOSIS — Z170 Estrogen receptor positive status [ER+]: Secondary | ICD-10-CM | POA: Diagnosis not present

## 2019-01-29 MED ORDER — ACETAMINOPHEN 325 MG PO TABS
650.0000 mg | ORAL_TABLET | Freq: Once | ORAL | Status: AC
  Administered 2019-01-29: 650 mg via ORAL
  Filled 2019-01-29: qty 2

## 2019-01-29 MED ORDER — TRASTUZUMAB-ANNS CHEMO 150 MG IV SOLR
150.0000 mg | Freq: Once | INTRAVENOUS | Status: AC
  Administered 2019-01-29: 150 mg via INTRAVENOUS
  Filled 2019-01-29: qty 7.14

## 2019-01-29 MED ORDER — SODIUM CHLORIDE 0.9 % IV SOLN
Freq: Once | INTRAVENOUS | Status: AC
  Administered 2019-01-29: 14:00:00 via INTRAVENOUS
  Filled 2019-01-29: qty 250

## 2019-01-29 MED ORDER — HEPARIN SOD (PORK) LOCK FLUSH 100 UNIT/ML IV SOLN
500.0000 [IU] | Freq: Once | INTRAVENOUS | Status: AC | PRN
  Administered 2019-01-29: 500 [IU]
  Filled 2019-01-29: qty 5

## 2019-01-29 MED ORDER — SODIUM CHLORIDE 0.9% FLUSH
10.0000 mL | INTRAVENOUS | Status: DC | PRN
  Administered 2019-01-29: 10 mL
  Filled 2019-01-29: qty 10

## 2019-02-03 NOTE — Progress Notes (Signed)
[name]  [address], [address] [place], [place] [address] Phone: [phone]  Fax: [phone]   Clinic Day:  02/03/2019  Referring physician: Einar Pheasant, MD  Chief Complaint: [name] [name] is a 85 y.o. female with stage I Her2/neu + breast cancer who is seen for 1 month assessment on trastuzumab [name] [name]).   HPI: The patient was last seen in the medical oncology clinic on 01/08/2019. At that time, she was doing well. She denied any PND or orthopnea. Exam was stable. She received Herceptin. She continued on [name] [name]. Hematocrit 36.8, hemoglobin 11.9, platelets 228,000, WBC 9,000. Total protein was 6.4. Magnesium 2.0.    She was seen in genetic counseling with [name] [name] on 01/16/2019.  Genetic testing on 01/14/2019 revealed no pathogenic mutations.   She received trastuzumab weekly x 3 (01/15/2019 - 01/29/2019).   During the interim, the patients feels "good". She notes numbness in her right foot. She reports frequency several times a night. She denies any shortness of breath, and her leg swelling has improved.    Past Medical History:  Diagnosis Date  . Diverticulitis of sigmoid colon   . Endometriosis   . Family history of breast cancer   . Family history of ovarian cancer   . Family history of prostate cancer   . Family history of uterine cancer   . Fibrocystic breast disease   . Glaucoma   . H/O urinary incontinence   . History of chicken pox   . History of colon polyps 2014  . Hyperlipidemia   . Hypertension   . Ovarian cancer ([name] [name]) 2002   Chemo tx's @ [facility] with Total Hysterectomy. Dr [name] [name]  . Personal history of chemotherapy    ovarian cancer  . Personal history of ovarian cancer   . Port-A-Cath in place   . Sinusitis   . Tuberculosis    positve skin test    Past Surgical History:  Procedure Laterality Date  . ABDOMINAL HYSTERECTOMY  2003   complete Dr [name] [name]  . ABSCESS DRAINAGE  2017   right buttock at [name]  .  APPENDECTOMY  2003  . BREAST EXCISIONAL BIOPSY Left 1993   excisional bx. negative results  . BREAST SURGERY Left 1982   papilloma removal  . COLONOSCOPY  2006, 2014   Dr [name] [name]   . COLONOSCOPY N/A 03/27/2016   Procedure: COLONOSCOPY;  Surgeon: [name] Bellow, MD;  Location: [name] END[facility];  Service: Endoscopy;  Laterality: N/A;  . COLOSTOMY REVERSAL  2014   at [place] Dr [name] [name]  . NASAL SINUS SURGERY     For fungal infection  . NASAL SINUS SURGERY  8/05 8/06  . OOPHORECTOMY    . OSTOMY  05/22/2012   diverticulitis with abcess/ Dr [name] Julian  . ovarian cancer     Exploratory lap  . PORTA CATH INSERTION N/A 05/26/2018   Procedure: PORTA CATH INSERTION;  Surgeon: [name] Huxley, MD;  Location: [facility];  Service: Cardiovascular;  Laterality: N/A;  . TONSILLECTOMY AND ADENOIDECTOMY  1947    Family History  Problem Relation Age of Onset  . Hypertension Mother   . Coronary artery disease Mother   . Heart disease Mother   . Kidney disease Mother   . Diabetes Mother   . Cancer Mother        uterine cancer dx 70s  . Diabetes Father   . Kidney failure Father   . Diabetes Brother   . Coronary artery disease Brother   .  Cancer Brother        bladder cancer  . Stroke Sister   . Heart disease Sister        [name] [name]  . Cancer Maternal Aunt 49       ovarian cancer  . Breast cancer Cousin   . Cancer Brother        Prostate    Social History:  reports that she has never smoked. She has never used smokeless tobacco. She reports that she does not drink alcohol or use drugs. Patient is a retired Engineer, site. Generally accompanied to appointments by friend, [name] [name]. She lives in [place]. The patient is alone today.  Allergies: No Known Allergies  Current Medications: Current Outpatient Medications  Medication Sig Dispense Refill  . amLODipine (NORVASC) 2.5 MG tablet TAKE 1 TABLET BY MOUTH EVERY DAY 30 tablet 2  . Calcium Carbonate-Vitamin D 600-400  MG-UNIT tablet Take 1 tablet by mouth daily.     Marland Kitchen lidocaine-prilocaine (EMLA) cream Apply 1 application topically as needed. 30 g 5  . Multiple Vitamins-Minerals (MULTIVITAMIN WITH MINERALS) tablet Take 1 tablet by mouth daily.    . prochlorperazine (COMPAZINE) 10 MG tablet Take 1 tablet (10 mg total) by mouth every 6 (six) hours as needed for nausea or vomiting. (Patient not taking: Reported on 11/20/2018) 30 tablet 3  . [name] [name] (NOLVADEX) 20 MG tablet TAKE 1 TABLET BY MOUTH EVERY DAY 30 tablet 1   Current Facility-Administered Medications  Medication Dose Route Frequency Provider Last Rate Last Dose  . lidocaine-prilocaine (EMLA) cream   Topical PRN [name] [name], [name] Squibb, MD       Facility-Administered Medications Ordered in Other Visits  Medication Dose Route Frequency Provider Last Rate Last Dose  . sodium chloride 0.9 % 50 mL with famotidine (PEPCID) 20 mg infusion  20 mg Intravenous Once [name] Dalton R, MD      . sodium chloride flush (NS) 0.9 % injection 10 mL  10 mL Intravenous PRN [name] Sickle, MD   10 mL at 06/24/18 0854    Review of Systems  Constitutional: Negative.  Negative for chills, diaphoresis, fever, malaise/fatigue and weight loss (up 6 lbs).       Feels "good".  HENT: Negative.  Negative for congestion, ear discharge, nosebleeds, sinus pain, sore throat and tinnitus.   Eyes: Negative.  Negative for blurred vision, double vision, photophobia and pain.  Respiratory: Negative.  Negative for cough, hemoptysis, sputum production, shortness of breath and wheezing.   Cardiovascular: Negative.  Negative for chest pain, palpitations, orthopnea, leg swelling (left leg - chronic, improved) and PND.  Gastrointestinal: Negative.  Negative for abdominal pain, blood in stool, constipation, diarrhea, melena, nausea and vomiting.  Genitourinary: Positive for frequency (several times a night). Negative for dysuria, hematuria and urgency.  Musculoskeletal: Negative.  Negative for  back pain, falls, joint pain, myalgias and neck pain.  Skin: Negative.  Negative for itching and rash.  Neurological: Positive for sensory change (numbness in right foot). Negative for dizziness, tingling, tremors, speech change, focal weakness, weakness and headaches.  Endo/Heme/Allergies: Negative.  Does not bruise/bleed easily.  Psychiatric/Behavioral: Negative.  Negative for depression and memory loss. The patient is not nervous/anxious and does not have insomnia.   All other systems reviewed and are negative.  Performance status (ECOG): 1  Vitals Blood pressure (!) 142/79, pulse 63, temperature (!) 97.5 F (36.4 C), temperature source Tympanic, resp. rate 18, height 5' 3" (1.6 m), weight 165 lb 9.1 oz (75.1 kg), SpO2  100 %.  Physical Exam  Constitutional: She is oriented to person, place, and time. She appears well-developed and well-nourished. No distress.  HENT:  Head: Normocephalic and atraumatic.  Mouth/Throat: Oropharynx is clear and moist. No oropharyngeal exudate.  Curly gray hair.  Mask.  Upper dentures.  Eyes: Pupils are equal, round, and reactive to light. Conjunctivae and EOM are normal. No scleral icterus.  Glasses.  Brown eyes.  Neck: Normal range of motion. Neck supple. No JVD present.  Cardiovascular: Normal rate, regular rhythm and normal heart sounds. Exam reveals no gallop and no friction rub.  No murmur heard. Pulmonary/Chest: Effort normal and breath sounds normal. No respiratory distress. She has no wheezes. She has no rales. She exhibits no tenderness.  Abdominal: Soft. Bowel sounds are normal. She exhibits no distension and no mass. There is no abdominal tenderness. There is no rebound and no guarding.  Musculoskeletal: Normal range of motion.        General: No edema.  Lymphadenopathy:    She has no cervical adenopathy.    She has no axillary adenopathy.       Right: No supraclavicular adenopathy present.       Left: No supraclavicular adenopathy present.   Neurological: She is alert and oriented to person, place, and time. She has normal reflexes.  Skin: Skin is warm and dry. No rash noted. She is not diaphoretic. No erythema. No pallor.  Psychiatric: She has a normal mood and affect. Her behavior is normal. Judgment and thought content normal.  Nursing note and vitals reviewed.   No visits with results within 3 Day(s) from this visit.  Latest known visit with results is:  Infusion on 01/08/2019  Component Date Value Ref Range Status  . Magnesium 01/08/2019 2.0  1.7 - 2.4 mg/dL Final   Performed at [facility], [address] [place], [place] [address]  . Sodium 01/08/2019 136  135 - 145 mmol/L Final  . Potassium 01/08/2019 3.5  3.5 - 5.1 mmol/L Final  . Chloride 01/08/2019 104  98 - 111 mmol/L Final  . CO2 01/08/2019 23  22 - 32 mmol/L Final  . Glucose, Bld 01/08/2019 129* 70 - 99 mg/dL Final  . BUN 01/08/2019 19  8 - 23 mg/dL Final  . Creatinine, Ser 01/08/2019 0.59  0.44 - 1.00 mg/dL Final  . Calcium 01/08/2019 9.0  8.9 - 10.3 mg/dL Final  . Total Protein 01/08/2019 6.4* 6.5 - 8.1 g/dL Final  . Albumin 01/08/2019 3.6  3.5 - 5.0 g/dL Final  . AST 01/08/2019 29  15 - 41 U/L Final  . ALT 01/08/2019 19  0 - 44 U/L Final  . Alkaline Phosphatase 01/08/2019 67  38 - 126 U/L Final  . Total Bilirubin 01/08/2019 0.4  0.3 - 1.2 mg/dL Final  . GFR calc non Af Amer 01/08/2019 >60  >60 mL/min Final  . GFR calc Af Amer 01/08/2019 >60  >60 mL/min Final  . Anion gap 01/08/2019 9  5 - 15 Final   Performed at [name], [address] [place],  [address]  . WBC 01/08/2019 9.0  4.0 - 10.5 K/uL Final  . RBC 01/08/2019 3.93  3.87 - 5.11 MIL/uL Final  . Hemoglobin 01/08/2019 11.9* 12.0 - 15.0 g/dL Final  . HCT 01/08/2019 36.8  36.0 - 46.0 % Final  . MCV 01/08/2019 93.6  80.0 - 100.0 fL Final  . MCH 01/08/2019 30.3  26.0 - 34.0 pg Final  . MCHC 01/08/2019  32.3  30.0 - 36.0 g/dL Final  . RDW 01/08/2019 15.1  11.5  - 15.5 % Final  . Platelets 01/08/2019 228  150 - 400 K/uL Final  . nRBC 01/08/2019 0.0  0.0 - 0.2 % Final  . Neutrophils Relative % 01/08/2019 64  % Final  . Neutro Abs 01/08/2019 5.9  1.7 - 7.7 K/uL Final  . Lymphocytes Relative 01/08/2019 25  % Final  . Lymphs Abs 01/08/2019 2.2  0.7 - 4.0 K/uL Final  . Monocytes Relative 01/08/2019 9  % Final  . Monocytes Absolute 01/08/2019 0.8  0.1 - 1.0 K/uL Final  . Eosinophils Relative 01/08/2019 1  % Final  . Eosinophils Absolute 01/08/2019 0.1  0.0 - 0.5 K/uL Final  . Basophils Relative 01/08/2019 1  % Final  . Basophils Absolute 01/08/2019 0.1  0.0 - 0.1 K/uL Final  . Immature Granulocytes 01/08/2019 0  % Final  . Abs Immature Granulocytes 01/08/2019 0.04  0.00 - 0.07 K/uL Final   Performed at [name], [address] [place], [place] [address]    Assessment:  [name] [name] is a 86 y.o. female with stage I Her2/neu + breast cancer s/p lumpectomy and sentinel lymph node biopsy on 03/18/2018. Pathologyrevealed a 0.6 cm grade III invasive mammary carcinoma. There was DCIS. There was no lymphovascular invasion. Tumor was ER + (99%), PR + (25%), and Her2/neu + by FISH. Pathologic stagewas T1bN0.  Screening mammogram on 01/17/2018 revealed calcifications and possible mass in the right breast. Diagnostic mammogram and ultrasoundon 01/22/2018 revealed indeterminate group of microcalcifications over the right upper outer quadrant spanning 4 x 9 x 11 mm. There was a 4 x 7 x 7 mm probable complicated cyst at the 11 o'clock position of the right breast 5 cm from the nipple.  Shereceived12 weeks ofHerceptin and Taxol (05/20/2018 - 08/05/2018).She has continued weekly Herceptin(09/05/2018; 09/26/2018 - 01/08/2019).Herceptin was held on06/09/2018.  Herceptin was switched to [place] on 01/15/2019 (last 01/29/2019).  Shereceivedwhole breastradiationfrom04/11/2018 - 11/05/2018.Tamoxifenbegan 11/20/2018.   CA27.29 has been followed: 10.7 on 10/03/2018 and < 3.5 on 10/16/2018.  Echoat [facility] on 03/12/2018 revealed an EF of 61%. Echo at [name] on03/02/2019 revealed an LVEF of 50 to 55%. There was a trivial pericardial effusion waspresent.[place] 11/12/2018 revealed an EFof 45-50%. [place] 12/10/2018 revealed an EFof55-60%.  He has a history of stage IIB ovarian cancer s/p exploratory laparotomy, TAH/BSO, omentectomy, peritoneal biopsies, selective pelvic and periaortic lymph node sampling on 06/20/2001. Pathologyrevealed a grade 1 papillary serous carcinoma of the ovary. Uterine serosa and the surface of the contralateral ovary were positive for metastatic disease. The primary tumor was approximately 15 cm in diameter. She received 6 cycles ofcarboplatin and Taxol.  Bone densityon 04/01/2017 revealed osteoporosiswith a T-score of -2.5 in the left femoral neck.  She has grade I neuropathyin her feet. She has chronic right lower extremity edema. Right lower extremity duplexon 07/22/2018 revealed no femoropopliteal and no calf DVT in the visualized calf veins.  Symptomatically, she feels good.  Exam is unremarkable.  Plan: 1.   Labs today:  CBC with diff, CMP, CA27.29. 2.Stage I HER-2/neu (+) breast cancer Clinically, she continues to do well. Radiation completed on 11/05/2018.  She continues [name] [name] (began 11/20/2018) Interval echo inadvertently not done, reschedule for next week. Review plan for 1 year (52 weeks) of HER-2/neu based therapy.   Kanjinti (Herceptin biosimilar) today and weekly x3. 3.Chemotherapy-induced neuropathy He has a stable neuropathy in her right toes.   Neuropathy does not affect gait.  Continue to monitor. 4.Osteoporosis Continue calcium and vitamin D. Dr. Nicki Reaper will be managing her osteoporosis. 5.Decline in ejection fraction (EF) Echo on 08/18/2018 revealed anEFof50-55%. Echo on 11/12/2018 revealed  anEFof 45-50%. Echo on07/01/2020revealed an EF of55-60%. Await follow-up echo. 6.Reschedule echo on 02/11/2019. 7.   RTC in 4 weeks for MD assessment, labs (CBC with diff, CMP, CA27.29), review of echo and Kanjinti.  Addendum:  Echo on 02/11/2019 revealed an ejection fraction of 50-55%.  I discussed the assessment and treatment plan with the patient.  The patient was provided an opportunity to ask questions and all were answered.  The patient agreed with the plan and demonstrated an understanding of the instructions.  The patient was advised to call back if the symptoms worsen or if the condition fails to improve as anticipated.   Lequita Asal, MD, PhD    02/03/2019, 1:28 PM  I, Selena Batten, am acting as scribe for [employer]. Mike Gip, MD, PhD.  I, Melissa C. Mike Gip, MD, have reviewed the above documentation for accuracy and completeness, and I agree with the above.

## 2019-02-04 ENCOUNTER — Other Ambulatory Visit: Payer: Self-pay

## 2019-02-04 ENCOUNTER — Ambulatory Visit
Admission: RE | Admit: 2019-02-04 | Discharge: 2019-02-04 | Disposition: A | Discharge status: Home / Self Care | Payer: Medicare HMO | Source: Ambulatory Visit | Attending: Hematology and Oncology | Admitting: Hematology and Oncology

## 2019-02-04 DIAGNOSIS — Z170 Estrogen receptor positive status [ER+]: Secondary | ICD-10-CM

## 2019-02-04 DIAGNOSIS — R931 Abnormal findings on diagnostic imaging of heart and coronary circulation: Secondary | ICD-10-CM

## 2019-02-04 DIAGNOSIS — C50411 Malignant neoplasm of upper-outer quadrant of right female breast: Secondary | ICD-10-CM

## 2019-02-05 ENCOUNTER — Inpatient Hospital Stay (HOSPITAL_BASED_OUTPATIENT_CLINIC_OR_DEPARTMENT_OTHER): Payer: Medicare HMO | Admitting: Hematology and Oncology

## 2019-02-05 ENCOUNTER — Encounter: Payer: Self-pay | Admitting: Hematology and Oncology

## 2019-02-05 ENCOUNTER — Ambulatory Visit: Payer: Medicare HMO

## 2019-02-05 ENCOUNTER — Inpatient Hospital Stay: Payer: Medicare HMO

## 2019-02-05 VITALS — BP 142/79 | HR 63 | Temp 97.5°F | Resp 18 | Ht 63.0 in | Wt 165.6 lb

## 2019-02-05 VITALS — BP 158/78 | HR 64 | Temp 97.6°F | Resp 18

## 2019-02-05 DIAGNOSIS — C50411 Malignant neoplasm of upper-outer quadrant of right female breast: Secondary | ICD-10-CM

## 2019-02-05 DIAGNOSIS — M810 Age-related osteoporosis without current pathological fracture: Secondary | ICD-10-CM | POA: Diagnosis not present

## 2019-02-05 DIAGNOSIS — T451X5A Adverse effect of antineoplastic and immunosuppressive drugs, initial encounter: Secondary | ICD-10-CM | POA: Diagnosis not present

## 2019-02-05 DIAGNOSIS — Z170 Estrogen receptor positive status [ER+]: Secondary | ICD-10-CM

## 2019-02-05 DIAGNOSIS — G620 Drug-induced polyneuropathy: Secondary | ICD-10-CM | POA: Diagnosis not present

## 2019-02-05 DIAGNOSIS — Z5111 Encounter for antineoplastic chemotherapy: Secondary | ICD-10-CM | POA: Diagnosis not present

## 2019-02-05 DIAGNOSIS — Z5112 Encounter for antineoplastic immunotherapy: Secondary | ICD-10-CM | POA: Diagnosis not present

## 2019-02-05 LAB — COMPREHENSIVE METABOLIC PANEL
ALT: 18 U/L (ref 0–44)
AST: 23 U/L (ref 15–41)
Albumin: 3.3 g/dL — ABNORMAL LOW (ref 3.5–5.0)
Alkaline Phosphatase: 57 U/L (ref 38–126)
Anion gap: 7 (ref 5–15)
BUN: 18 mg/dL (ref 8–23)
CO2: 27 mmol/L (ref 22–32)
Calcium: 8.7 mg/dL — ABNORMAL LOW (ref 8.9–10.3)
Chloride: 104 mmol/L (ref 98–111)
Creatinine, Ser: 0.56 mg/dL (ref 0.44–1.00)
GFR calc Af Amer: >60 mL/min (ref >60)
GFR calc non Af Amer: >60 mL/min (ref >60)
Glucose, Bld: 90 mg/dL (ref 70–99)
Potassium: 4.1 mmol/L (ref 3.5–5.1)
Sodium: 138 mmol/L (ref 135–145)
Total Bilirubin: 0.5 mg/dL (ref 0.3–1.2)
Total Protein: 6.2 g/dL — ABNORMAL LOW (ref 6.5–8.1)

## 2019-02-05 LAB — CBC WITH DIFFERENTIAL/PLATELET
Abs Immature Granulocytes: 0.02 10*3/uL (ref 0.00–0.07)
Basophils Absolute: 0.1 10*3/uL (ref 0.0–0.1)
Basophils Relative: 1 %
Eosinophils Absolute: 0.1 10*3/uL (ref 0.0–0.5)
Eosinophils Relative: 1 %
HCT: 35.7 % — ABNORMAL LOW (ref 36.0–46.0)
Hemoglobin: 11.8 g/dL — ABNORMAL LOW (ref 12.0–15.0)
Immature Granulocytes: 0 %
Lymphocytes Relative: 25 %
Lymphs Abs: 2.2 10*3/uL (ref 0.7–4.0)
MCH: 31.3 pg (ref 26.0–34.0)
MCHC: 33.1 g/dL (ref 30.0–36.0)
MCV: 94.7 fL (ref 80.0–100.0)
Monocytes Absolute: 0.9 10*3/uL (ref 0.1–1.0)
Monocytes Relative: 10 %
Neutro Abs: 5.4 10*3/uL (ref 1.7–7.7)
Neutrophils Relative %: 63 %
Platelets: 229 10*3/uL (ref 150–400)
RBC: 3.77 MIL/uL — ABNORMAL LOW (ref 3.87–5.11)
RDW: 14.6 % (ref 11.5–15.5)
WBC: 8.7 10*3/uL (ref 4.0–10.5)
nRBC: 0 % (ref 0.0–0.2)

## 2019-02-05 MED ORDER — TRASTUZUMAB-ANNS CHEMO 150 MG IV SOLR
150.0000 mg | Freq: Once | INTRAVENOUS | Status: AC
  Administered 2019-02-05: 150 mg via INTRAVENOUS
  Filled 2019-02-05: qty 7.14

## 2019-02-05 MED ORDER — ACETAMINOPHEN 325 MG PO TABS
650.0000 mg | ORAL_TABLET | Freq: Once | ORAL | Status: AC
  Administered 2019-02-05: 650 mg via ORAL
  Filled 2019-02-05: qty 2

## 2019-02-05 MED ORDER — SODIUM CHLORIDE 0.9% FLUSH
10.0000 mL | INTRAVENOUS | Status: DC | PRN
  Administered 2019-02-05: 10 mL via INTRAVENOUS
  Filled 2019-02-05: qty 10

## 2019-02-05 MED ORDER — HEPARIN SOD (PORK) LOCK FLUSH 100 UNIT/ML IV SOLN
500.0000 [IU] | Freq: Once | INTRAVENOUS | Status: AC
  Administered 2019-02-05: 12:00:00 500 [IU] via INTRAVENOUS
  Filled 2019-02-05: qty 5

## 2019-02-05 MED ORDER — SODIUM CHLORIDE 0.9 % IV SOLN
Freq: Once | INTRAVENOUS | Status: AC
  Administered 2019-02-05: 10:00:00 via INTRAVENOUS
  Filled 2019-02-05: qty 250

## 2019-02-05 NOTE — Progress Notes (Signed)
No new changes noted today 

## 2019-02-06 DIAGNOSIS — Z853 Personal history of malignant neoplasm of breast: Secondary | ICD-10-CM | POA: Diagnosis not present

## 2019-02-06 DIAGNOSIS — C50411 Malignant neoplasm of upper-outer quadrant of right female breast: Secondary | ICD-10-CM | POA: Diagnosis not present

## 2019-02-06 DIAGNOSIS — Z9889 Other specified postprocedural states: Secondary | ICD-10-CM | POA: Diagnosis not present

## 2019-02-06 DIAGNOSIS — Z803 Family history of malignant neoplasm of breast: Secondary | ICD-10-CM | POA: Diagnosis not present

## 2019-02-06 DIAGNOSIS — Z1239 Encounter for other screening for malignant neoplasm of breast: Secondary | ICD-10-CM | POA: Diagnosis not present

## 2019-02-06 DIAGNOSIS — Z09 Encounter for follow-up examination after completed treatment for conditions other than malignant neoplasm: Secondary | ICD-10-CM | POA: Diagnosis not present

## 2019-02-06 DIAGNOSIS — Z170 Estrogen receptor positive status [ER+]: Secondary | ICD-10-CM | POA: Diagnosis not present

## 2019-02-06 LAB — CANCER ANTIGEN 27.29: CA 27.29: <3.5 U/mL (ref 0.0–38.6)

## 2019-02-11 ENCOUNTER — Other Ambulatory Visit: Payer: Self-pay

## 2019-02-11 ENCOUNTER — Ambulatory Visit
Admission: RE | Admit: 2019-02-11 | Discharge: 2019-02-11 | Disposition: A | Discharge status: Home / Self Care | Payer: Medicare HMO | Source: Ambulatory Visit | Attending: Hematology and Oncology | Admitting: Hematology and Oncology

## 2019-02-11 DIAGNOSIS — I10 Essential (primary) hypertension: Secondary | ICD-10-CM | POA: Diagnosis not present

## 2019-02-11 DIAGNOSIS — Z170 Estrogen receptor positive status [ER+]: Secondary | ICD-10-CM | POA: Diagnosis not present

## 2019-02-11 DIAGNOSIS — E785 Hyperlipidemia, unspecified: Secondary | ICD-10-CM | POA: Insufficient documentation

## 2019-02-11 DIAGNOSIS — I081 Rheumatic disorders of both mitral and tricuspid valves: Secondary | ICD-10-CM | POA: Insufficient documentation

## 2019-02-11 DIAGNOSIS — R931 Abnormal findings on diagnostic imaging of heart and coronary circulation: Secondary | ICD-10-CM | POA: Diagnosis not present

## 2019-02-11 DIAGNOSIS — C50411 Malignant neoplasm of upper-outer quadrant of right female breast: Secondary | ICD-10-CM

## 2019-02-11 NOTE — Progress Notes (Signed)
*  PRELIMINARY RESULTS* Echocardiogram 2D Echocardiogram has been performed.  [name] [name] 02/11/2019, 12:32 PM

## 2019-02-12 ENCOUNTER — Other Ambulatory Visit: Payer: Self-pay | Admitting: Hematology and Oncology

## 2019-02-12 ENCOUNTER — Inpatient Hospital Stay: Payer: Medicare HMO | Attending: Hematology and Oncology

## 2019-02-12 VITALS — BP 116/65 | HR 60 | Temp 96.9°F | Resp 20

## 2019-02-12 DIAGNOSIS — Z5111 Encounter for antineoplastic chemotherapy: Secondary | ICD-10-CM | POA: Diagnosis not present

## 2019-02-12 DIAGNOSIS — Z79810 Long term (current) use of selective estrogen receptor modulators (SERMs): Secondary | ICD-10-CM | POA: Insufficient documentation

## 2019-02-12 DIAGNOSIS — R350 Frequency of micturition: Secondary | ICD-10-CM | POA: Diagnosis not present

## 2019-02-12 DIAGNOSIS — C50411 Malignant neoplasm of upper-outer quadrant of right female breast: Secondary | ICD-10-CM | POA: Diagnosis present

## 2019-02-12 DIAGNOSIS — I10 Essential (primary) hypertension: Secondary | ICD-10-CM | POA: Insufficient documentation

## 2019-02-12 DIAGNOSIS — M810 Age-related osteoporosis without current pathological fracture: Secondary | ICD-10-CM | POA: Insufficient documentation

## 2019-02-12 DIAGNOSIS — Z923 Personal history of irradiation: Secondary | ICD-10-CM | POA: Diagnosis not present

## 2019-02-12 DIAGNOSIS — Z79899 Other long term (current) drug therapy: Secondary | ICD-10-CM | POA: Diagnosis not present

## 2019-02-12 DIAGNOSIS — Z170 Estrogen receptor positive status [ER+]: Secondary | ICD-10-CM | POA: Insufficient documentation

## 2019-02-12 DIAGNOSIS — M2550 Pain in unspecified joint: Secondary | ICD-10-CM | POA: Insufficient documentation

## 2019-02-12 DIAGNOSIS — G620 Drug-induced polyneuropathy: Secondary | ICD-10-CM | POA: Insufficient documentation

## 2019-02-12 MED ORDER — TRASTUZUMAB-ANNS CHEMO 150 MG IV SOLR
150.0000 mg | Freq: Once | INTRAVENOUS | Status: AC
  Administered 2019-02-12: 150 mg via INTRAVENOUS
  Filled 2019-02-12: qty 7.14

## 2019-02-12 MED ORDER — ACETAMINOPHEN 325 MG PO TABS
650.0000 mg | ORAL_TABLET | Freq: Once | ORAL | Status: AC
  Administered 2019-02-12: 650 mg via ORAL

## 2019-02-12 MED ORDER — HEPARIN SOD (PORK) LOCK FLUSH 100 UNIT/ML IV SOLN
500.0000 [IU] | Freq: Once | INTRAVENOUS | Status: AC | PRN
  Administered 2019-02-12: 500 [IU]

## 2019-02-12 MED ORDER — SODIUM CHLORIDE 0.9% FLUSH
10.0000 mL | INTRAVENOUS | Status: DC | PRN
  Administered 2019-02-12: 10 mL
  Filled 2019-02-12: qty 10

## 2019-02-12 MED ORDER — SODIUM CHLORIDE 0.9 % IV SOLN
Freq: Once | INTRAVENOUS | Status: AC
  Administered 2019-02-12: 14:00:00 via INTRAVENOUS
  Filled 2019-02-12: qty 250

## 2019-02-19 ENCOUNTER — Other Ambulatory Visit: Payer: Self-pay

## 2019-02-19 ENCOUNTER — Other Ambulatory Visit: Payer: Self-pay | Admitting: Hematology and Oncology

## 2019-02-19 ENCOUNTER — Inpatient Hospital Stay: Payer: Medicare HMO

## 2019-02-19 VITALS — BP 132/76 | HR 65 | Temp 97.0°F | Resp 18

## 2019-02-19 DIAGNOSIS — M2550 Pain in unspecified joint: Secondary | ICD-10-CM | POA: Diagnosis not present

## 2019-02-19 DIAGNOSIS — Z170 Estrogen receptor positive status [ER+]: Secondary | ICD-10-CM | POA: Diagnosis not present

## 2019-02-19 DIAGNOSIS — G620 Drug-induced polyneuropathy: Secondary | ICD-10-CM | POA: Diagnosis not present

## 2019-02-19 DIAGNOSIS — Z923 Personal history of irradiation: Secondary | ICD-10-CM | POA: Diagnosis not present

## 2019-02-19 DIAGNOSIS — R350 Frequency of micturition: Secondary | ICD-10-CM | POA: Diagnosis not present

## 2019-02-19 DIAGNOSIS — Z5111 Encounter for antineoplastic chemotherapy: Secondary | ICD-10-CM | POA: Diagnosis not present

## 2019-02-19 DIAGNOSIS — Z79810 Long term (current) use of selective estrogen receptor modulators (SERMs): Secondary | ICD-10-CM | POA: Diagnosis not present

## 2019-02-19 DIAGNOSIS — C50411 Malignant neoplasm of upper-outer quadrant of right female breast: Secondary | ICD-10-CM | POA: Diagnosis not present

## 2019-02-19 DIAGNOSIS — M810 Age-related osteoporosis without current pathological fracture: Secondary | ICD-10-CM | POA: Diagnosis not present

## 2019-02-19 DIAGNOSIS — I10 Essential (primary) hypertension: Secondary | ICD-10-CM | POA: Diagnosis not present

## 2019-02-19 MED ORDER — HEPARIN SOD (PORK) LOCK FLUSH 100 UNIT/ML IV SOLN
500.0000 [IU] | Freq: Once | INTRAVENOUS | Status: AC | PRN
  Administered 2019-02-19: 500 [IU]
  Filled 2019-02-19: qty 5

## 2019-02-19 MED ORDER — SODIUM CHLORIDE 0.9% FLUSH
10.0000 mL | INTRAVENOUS | Status: DC | PRN
  Administered 2019-02-19: 10 mL
  Filled 2019-02-19: qty 10

## 2019-02-19 MED ORDER — SODIUM CHLORIDE 0.9 % IV SOLN
Freq: Once | INTRAVENOUS | Status: AC
  Administered 2019-02-19: 14:00:00 via INTRAVENOUS
  Filled 2019-02-19: qty 250

## 2019-02-19 MED ORDER — ACETAMINOPHEN 325 MG PO TABS
650.0000 mg | ORAL_TABLET | Freq: Once | ORAL | Status: AC
  Administered 2019-02-19: 650 mg via ORAL
  Filled 2019-02-19: qty 2

## 2019-02-19 MED ORDER — TRASTUZUMAB-ANNS CHEMO 150 MG IV SOLR
150.0000 mg | Freq: Once | INTRAVENOUS | Status: AC
  Administered 2019-02-19: 150 mg via INTRAVENOUS
  Filled 2019-02-19: qty 7.14

## 2019-02-24 IMAGING — US US EXTREM LOW VENOUS*R*
1 series · 14 of 24 positions shown · non-contrast
Comparison: None

CLINICAL DATA: Swelling x months

EXAM:
RIGHT LOWER EXTREMITY VENOUS DOPPLER ULTRASOUND
TECHNIQUE: Gray-scale sonography with compression, as well as color and duplex
ultrasound, were performed to evaluate the deep venous system from
the level of the common femoral vein through the popliteal and
proximal calf veins.

[Series 1: us extrem low venous*right* · 0.07mm/px · 14 of 34 slices shown]
[im 1/34]
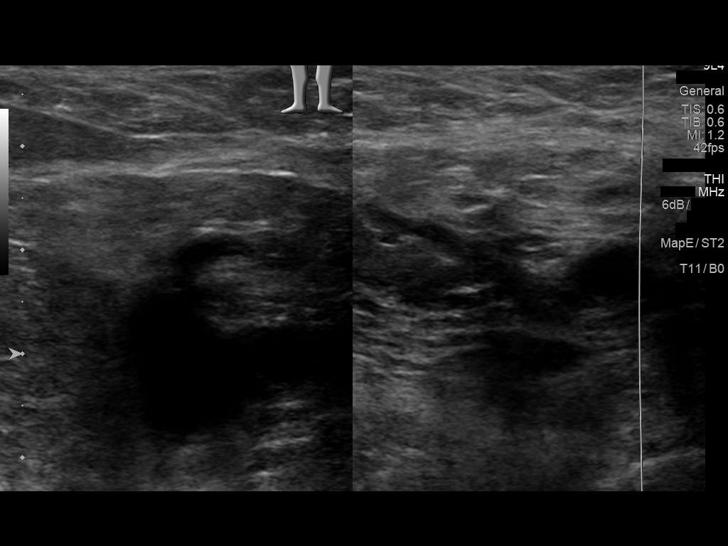
[im 3/34]
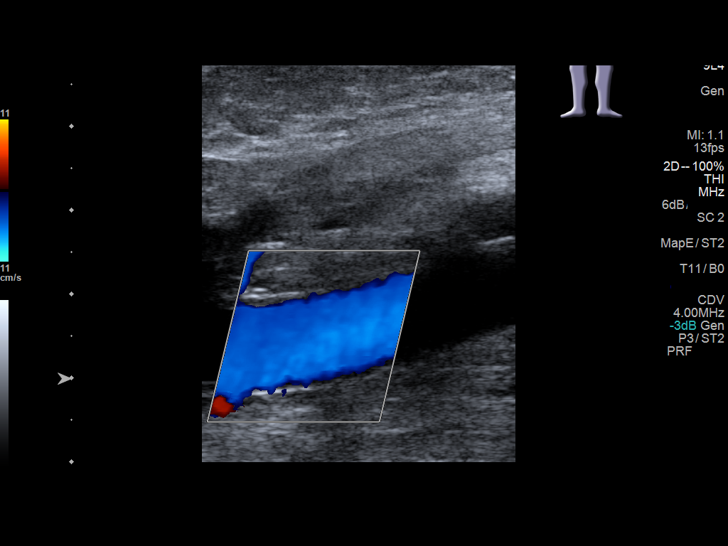
[im 6/34]
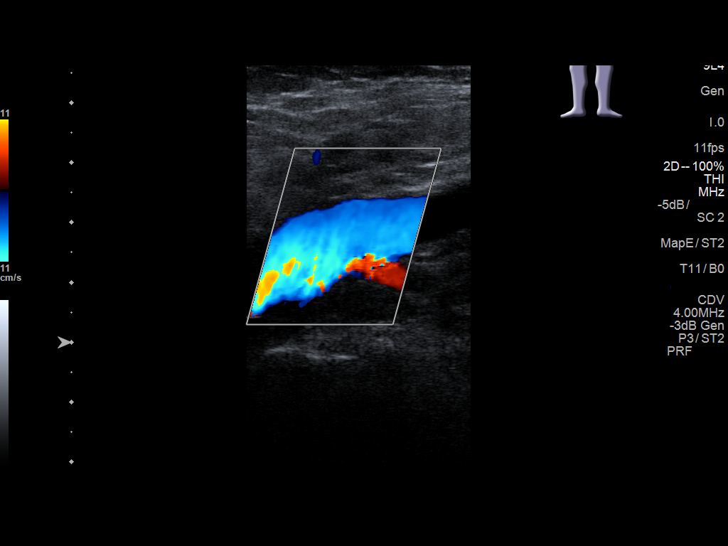
[im 9/34]
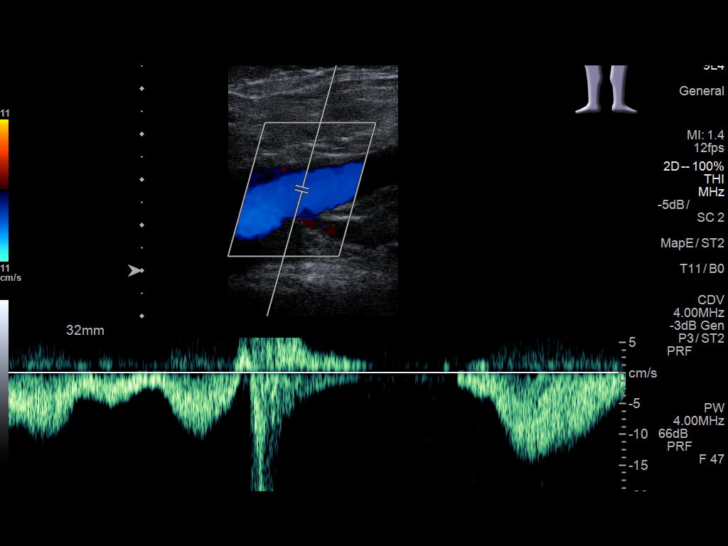
[im 11/34]
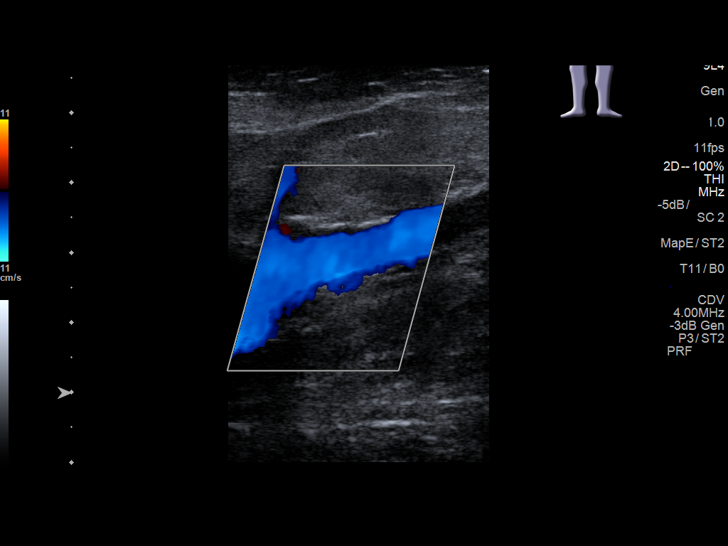
[im 13/34]
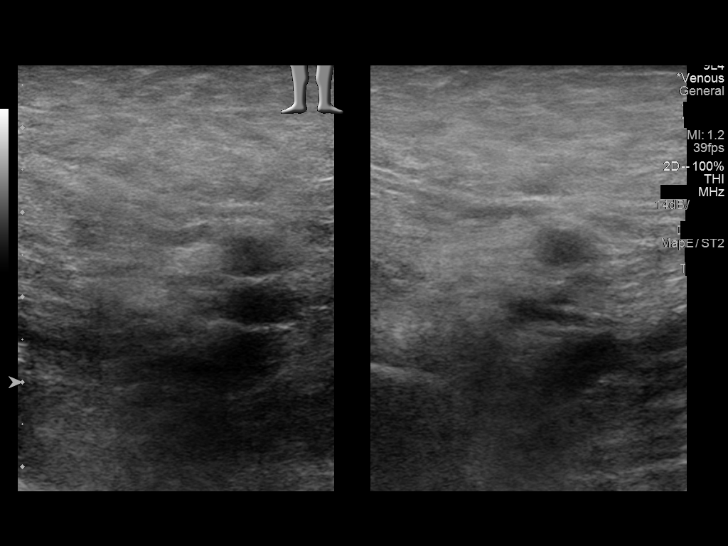
[im 16/34]
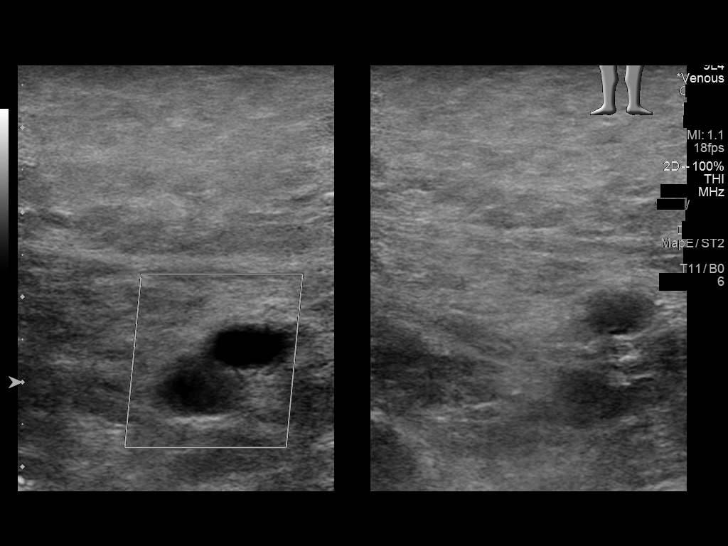
[im 18/34]
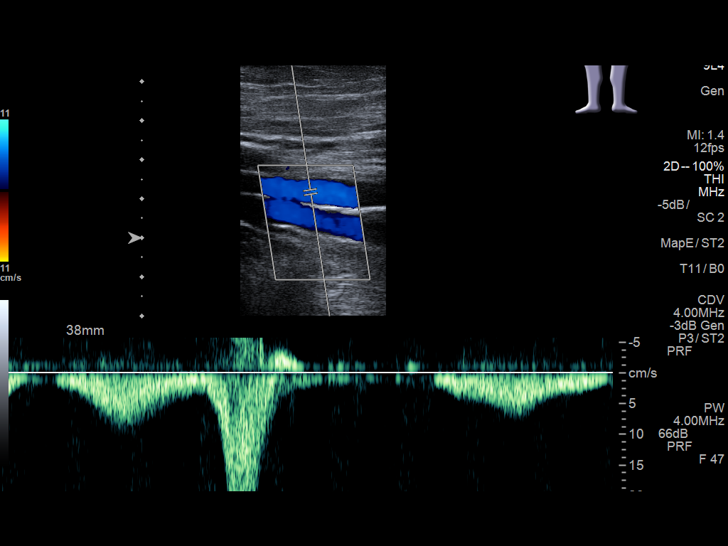
[im 21/34]
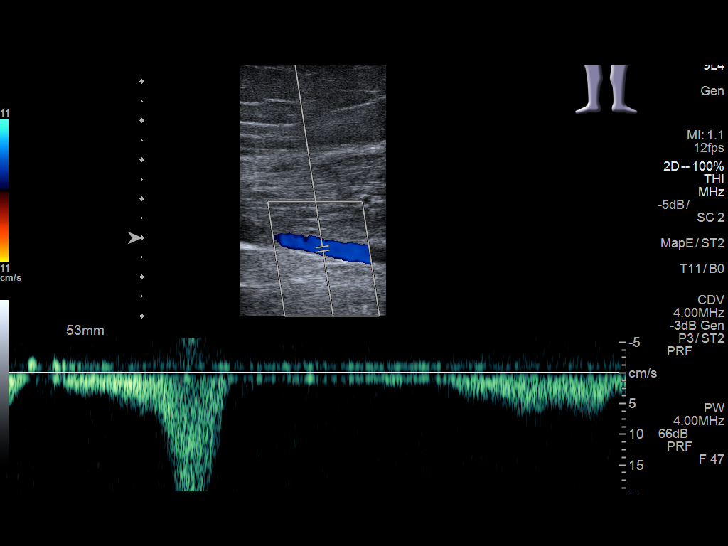
[im 23/34]
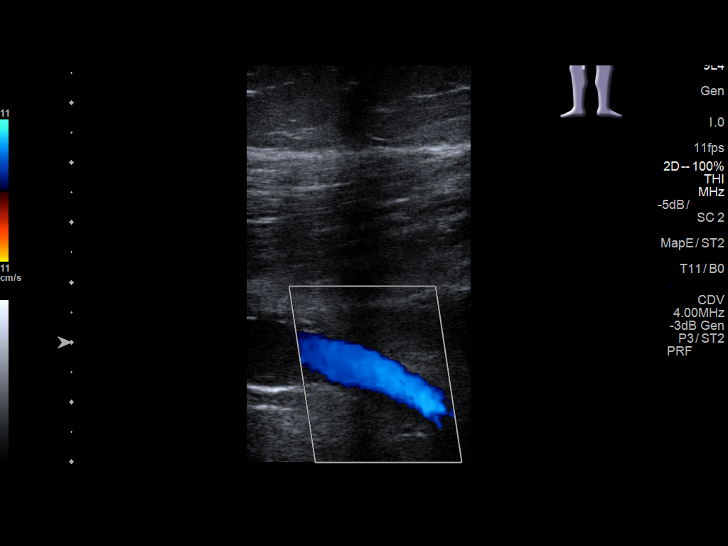
[im 26/34]
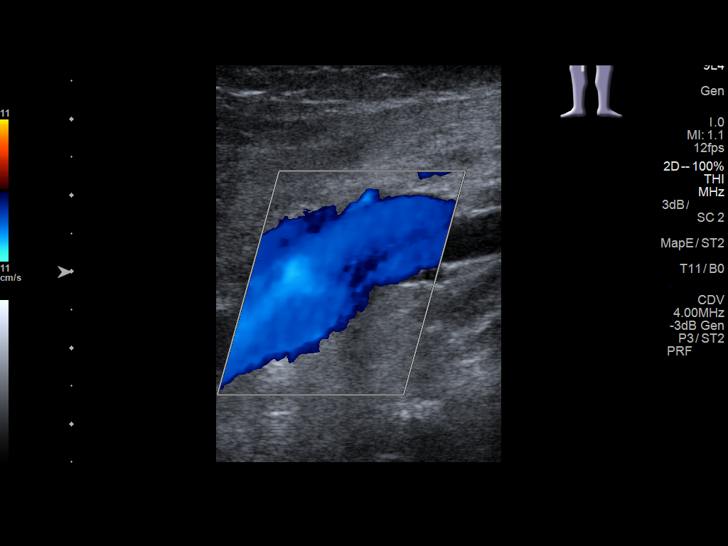
[im 28/34]
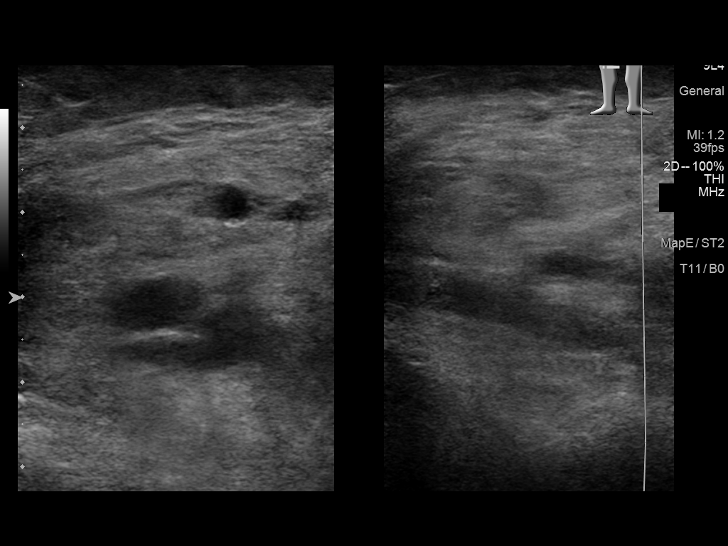
[im 31/34]
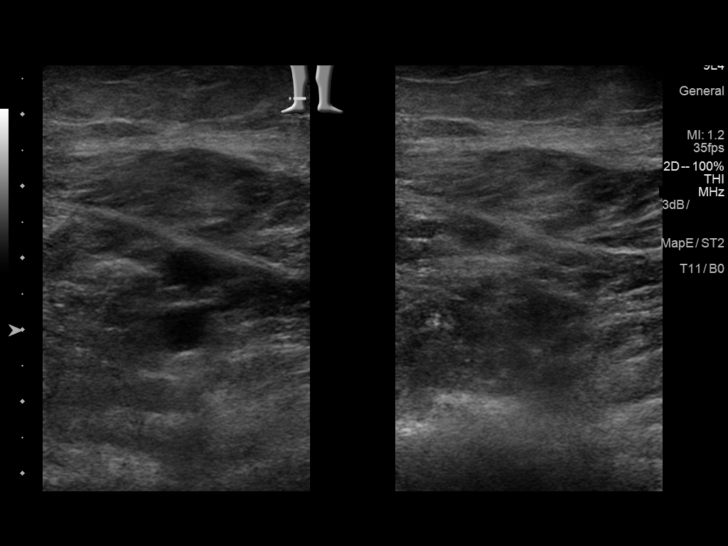
[im 34/34]
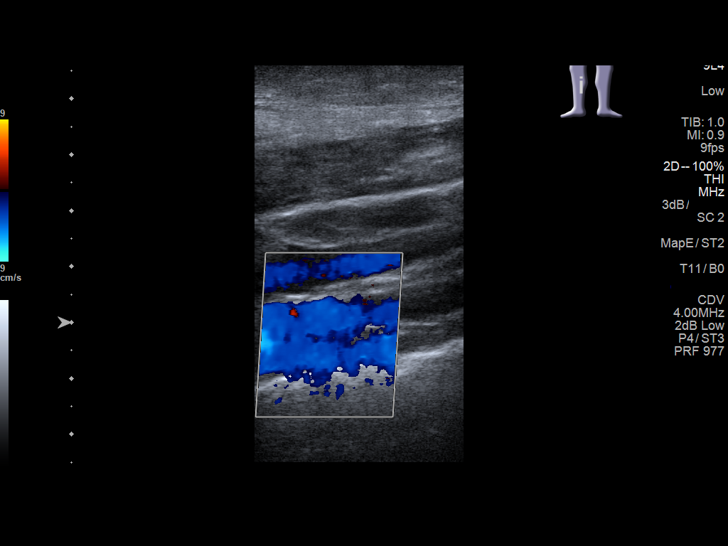

[14 of 24 positions shown; findings below may reference images not displayed]

FINDINGS: Normal compressibility of the common femoral, superficial femoral,
and popliteal veins, as well as the proximal calf veins. No filling
defects to suggest DVT on grayscale or color Doppler imaging.
Doppler waveforms show normal direction of venous flow, normal
respiratory phasicity and response to augmentation. Survey views of
the contralateral common femoral vein are unremarkable.
IMPRESSION: No femoropopliteal and no calf DVT in the visualized calf veins. If
clinical symptoms are inconsistent or if there are persistent or
worsening symptoms, further imaging (possibly involving the iliac
veins) may be warranted.

## 2019-02-25 ENCOUNTER — Other Ambulatory Visit: Payer: Self-pay

## 2019-02-26 ENCOUNTER — Inpatient Hospital Stay: Payer: Medicare HMO

## 2019-02-26 VITALS — BP 138/73 | HR 64 | Temp 97.0°F | Resp 18 | Wt 165.0 lb

## 2019-02-26 DIAGNOSIS — G620 Drug-induced polyneuropathy: Secondary | ICD-10-CM | POA: Diagnosis not present

## 2019-02-26 DIAGNOSIS — I10 Essential (primary) hypertension: Secondary | ICD-10-CM | POA: Diagnosis not present

## 2019-02-26 DIAGNOSIS — C50411 Malignant neoplasm of upper-outer quadrant of right female breast: Secondary | ICD-10-CM

## 2019-02-26 DIAGNOSIS — Z923 Personal history of irradiation: Secondary | ICD-10-CM | POA: Diagnosis not present

## 2019-02-26 DIAGNOSIS — R350 Frequency of micturition: Secondary | ICD-10-CM | POA: Diagnosis not present

## 2019-02-26 DIAGNOSIS — Z170 Estrogen receptor positive status [ER+]: Secondary | ICD-10-CM | POA: Diagnosis not present

## 2019-02-26 DIAGNOSIS — Z5111 Encounter for antineoplastic chemotherapy: Secondary | ICD-10-CM | POA: Diagnosis not present

## 2019-02-26 DIAGNOSIS — M2550 Pain in unspecified joint: Secondary | ICD-10-CM | POA: Diagnosis not present

## 2019-02-26 DIAGNOSIS — Z79810 Long term (current) use of selective estrogen receptor modulators (SERMs): Secondary | ICD-10-CM | POA: Diagnosis not present

## 2019-02-26 DIAGNOSIS — M810 Age-related osteoporosis without current pathological fracture: Secondary | ICD-10-CM | POA: Diagnosis not present

## 2019-02-26 MED ORDER — SODIUM CHLORIDE 0.9 % IV SOLN
Freq: Once | INTRAVENOUS | Status: AC
  Administered 2019-02-26: 14:00:00 via INTRAVENOUS
  Filled 2019-02-26: qty 250

## 2019-02-26 MED ORDER — HEPARIN SOD (PORK) LOCK FLUSH 100 UNIT/ML IV SOLN
500.0000 [IU] | Freq: Once | INTRAVENOUS | Status: AC | PRN
  Administered 2019-02-26: 500 [IU]
  Filled 2019-02-26: qty 5

## 2019-02-26 MED ORDER — TRASTUZUMAB-ANNS CHEMO 150 MG IV SOLR
150.0000 mg | Freq: Once | INTRAVENOUS | Status: AC
  Administered 2019-02-26: 150 mg via INTRAVENOUS
  Filled 2019-02-26: qty 7.14

## 2019-02-26 MED ORDER — SODIUM CHLORIDE 0.9% FLUSH
10.0000 mL | INTRAVENOUS | Status: DC | PRN
  Administered 2019-02-26: 10 mL
  Filled 2019-02-26: qty 10

## 2019-02-26 MED ORDER — ACETAMINOPHEN 325 MG PO TABS
650.0000 mg | ORAL_TABLET | Freq: Once | ORAL | Status: AC
  Administered 2019-02-26: 650 mg via ORAL
  Filled 2019-02-26: qty 2

## 2019-03-03 NOTE — Progress Notes (Signed)
[facility]  [address], [address] [place], [place] [address] Phone: [phone]  Fax: [phone]   Clinic Day:  03/05/2019  Referring physician: Einar Pheasant, MD  Chief Complaint: Louiza Moor Earp is a 85 y.o. female with stage I Her2/neu + breast cancer who is seen for 1 month assessment, review of echo and continuation of Kanjinti (Herceptin biosimilar).  HPI: The patient was last seen in the medical oncology clinic on 02/05/2019. At that time, she felt good. Exam was unremarkable. Her neuropathy was stable. She continued tamoxifen. She received Kanjinti.  An interval echo was dicussed.   She received Kanjinti weekly x 3 (02/12/2019 - 02/26/2019).  Echo on 02/11/2019 revealed an EF of 50-55%. There was a mildly increased left ventricular wall thickness. Left ventricular diastolic doppler parameters were consistent with impaired relaxation.  There was no evidence of left ventricular regional wall motion abnormalities. Right ventricular systolic pressure was mildly elevated with an estimated pressure of 31.7 mmHg.   She was seen for a routine oncologic follow up with Dr. Juan Quam at [facility] on 32/35/5732. She denied any breast concerns. Bilateral mammogram on 02/06/2019 revealed no evidence of malignancy.  Breasts were heterogeneously dense.  There was architectural distortion and density at the surgical site in the right breast.  During the interim, she has felt "all right". She notes a good energy level. She has no leg swelling. She has joint pain which she attributes to arthritis. The numbness in her right foot is mild. She denies any trouble walking.  Urinary frequency is improving; she only wakes at 3 am every night. She is taking a calcium plus vitamin D daily.    Past Medical History:  Diagnosis Date   Diverticulitis of sigmoid colon    Endometriosis    Family history of breast cancer    Family history of ovarian cancer    Family history  of prostate cancer    Family history of uterine cancer    Fibrocystic breast disease    Glaucoma    H/O urinary incontinence    History of chicken pox    History of colon polyps 2014   Hyperlipidemia    Hypertension    Ovarian cancer ([place]) 2002   Chemo tx's @ [facility] with Total Hysterectomy. Dr Amalia Hailey   Personal history of chemotherapy    ovarian cancer   Personal history of ovarian cancer    Port-A-Cath in place    Sinusitis    Tuberculosis    positve skin test    Past Surgical History:  Procedure Laterality Date   ABDOMINAL HYSTERECTOMY  2003   complete Dr Amalia Hailey   ABSCESS DRAINAGE  2017   right buttock at [place] EXCISIONAL BIOPSY Left 1993   excisional bx. negative results   BREAST SURGERY Left 1982   papilloma removal   COLONOSCOPY  2006, 2014   Dr Vira Agar    COLONOSCOPY N/A 03/27/2016   Procedure: COLONOSCOPY;  Surgeon: Robert Bellow, MD;  Location: [facility] ENDOSCOPY;  Service: Endoscopy;  Laterality: N/A;   COLOSTOMY REVERSAL  2014   at [facility] Dr Raynaldo Opitz   NASAL SINUS SURGERY     For fungal infection   NASAL SINUS SURGERY  8/05 8/06   OOPHORECTOMY     OSTOMY  05/22/2012   diverticulitis with abcess/ Dr Tamala Julian   ovarian cancer     Exploratory lap   PORTA CATH INSERTION N/A 05/26/2018   Procedure: Glori Luis  CATH INSERTION;  Surgeon: Algernon Huxley, MD;  Location: [facility];  Service: Cardiovascular;  Laterality: N/A;   TONSILLECTOMY AND ADENOIDECTOMY  1947    Family History  Problem Relation Age of Onset   Hypertension Mother    Coronary artery disease Mother    Heart disease Mother    Kidney disease Mother    Diabetes Mother    Cancer Mother        uterine cancer dx 61s   Diabetes Father    Kidney failure Father    Diabetes Brother    Coronary artery disease Brother    Cancer Brother        bladder cancer   Stroke Sister    Heart disease Sister        Pace Maker   Cancer  Maternal Aunt 49       ovarian cancer   Breast cancer Cousin    Cancer Brother        Prostate    Social History:  reports that she has never smoked. She has never used smokeless tobacco. She reports that she does not drink alcohol or use drugs. Patient is a retired Engineer, site. Generally accompanied to appointments by friend, [name]. She lives in [place]. The patient is alone today.  Allergies: No Known Allergies  Current Medications: Current Outpatient Medications  Medication Sig Dispense Refill   amLODipine (NORVASC) 2.5 MG tablet TAKE 1 TABLET BY MOUTH EVERY DAY 30 tablet 2   Calcium Carbonate-Vitamin D 600-400 MG-UNIT tablet Take 1 tablet by mouth daily.      Multiple Vitamins-Minerals (MULTIVITAMIN WITH MINERALS) tablet Take 1 tablet by mouth daily.     tamoxifen (NOLVADEX) 20 MG tablet TAKE 1 TABLET BY MOUTH EVERY DAY 30 tablet 1   lidocaine-prilocaine (EMLA) cream Apply 1 application topically as needed. (Patient not taking: Reported on 02/05/2019) 30 g 5   prochlorperazine (COMPAZINE) 10 MG tablet Take 1 tablet (10 mg total) by mouth every 6 (six) hours as needed for nausea or vomiting. (Patient not taking: Reported on 11/20/2018) 30 tablet 3   Current Facility-Administered Medications  Medication Dose Route Frequency Provider Last Rate Last Dose   lidocaine-prilocaine (EMLA) cream   Topical PRN Algernon Huxley, MD       Facility-Administered Medications Ordered in Other Visits  Medication Dose Route Frequency Provider Last Rate Last Dose   heparin lock flush 100 unit/mL  500 Units Intravenous Once Osualdo Hansell C, MD       sodium chloride 0.9 % 50 mL with famotidine (PEPCID) 20 mg infusion  20 mg Intravenous Once Charlaine Dalton R, MD       sodium chloride flush (NS) 0.9 % injection 10 mL  10 mL Intravenous PRN Charlaine Dalton R, MD   10 mL at 06/24/18 0854   sodium chloride flush (NS) 0.9 % injection 10 mL  10 mL Intravenous PRN Lequita Asal, MD   10 mL at 03/05/19 3151    Review of Systems  Constitutional: Negative.  Negative for chills, diaphoresis, fever, malaise/fatigue and weight loss (stable).       Feels "all right".  HENT: Negative.  Negative for congestion, ear discharge, nosebleeds, sinus pain, sore throat and tinnitus.   Eyes: Negative.  Negative for blurred vision, double vision, photophobia and pain.  Respiratory: Negative.  Negative for cough, hemoptysis, sputum production, shortness of breath and wheezing.   Cardiovascular: Negative.  Negative for chest pain, palpitations, leg swelling and PND.  Gastrointestinal: Negative.  Negative for abdominal pain, blood in stool, constipation, diarrhea, melena, nausea and vomiting.  Genitourinary: Negative.  Negative for dysuria, frequency (improving, x 1 at night), hematuria and urgency.  Musculoskeletal: Positive for joint pain (arthritis). Negative for back pain, falls, myalgias and neck pain.  Skin: Negative.  Negative for itching and rash.  Neurological: Positive for sensory change (numbness in right foot, mild). Negative for dizziness, tingling, tremors, speech change, focal weakness, weakness and headaches.  Endo/Heme/Allergies: Negative.  Does not bruise/bleed easily.  Psychiatric/Behavioral: Negative.  Negative for depression and memory loss. The patient is not nervous/anxious and does not have insomnia.   All other systems reviewed and are negative.  Performance status (ECOG): 1  Vitals Blood pressure 131/73, pulse 61, temperature (!) 97.5 F (36.4 C), temperature source Tympanic, resp. rate 18, weight 166 lb 8.9 oz (75.5 kg).   Physical Exam  Constitutional: She is oriented to person, place, and time. She appears well-developed and well-nourished. No distress.  HENT:  Head: Normocephalic and atraumatic.  Mouth/Throat: Oropharynx is clear and moist. No oropharyngeal exudate.  Curly gray hair. Upper dentures.  Mask.  Eyes: Pupils are equal, round, and  reactive to light. Conjunctivae are normal. No scleral icterus.  Glasses.  Brown eyes.  Neck: Normal range of motion. Neck supple. No JVD present.  Cardiovascular: Normal rate, regular rhythm and normal heart sounds. Exam reveals no gallop and no friction rub.  No murmur heard. Pulmonary/Chest: Effort normal and breath sounds normal. No respiratory distress. She has no wheezes. She has no rales. She exhibits no tenderness.  Abdominal: Soft. Bowel sounds are normal. She exhibits no distension and no mass. There is no abdominal tenderness. There is no rebound and no guarding.  Incisional hernia and scar tissue.  Musculoskeletal: Normal range of motion.        General: Tenderness (ankles) present. No edema.  Lymphadenopathy:    She has no cervical adenopathy.    She has no axillary adenopathy.       Right: No supraclavicular adenopathy present.       Left: No supraclavicular adenopathy present.  Neurological: She is alert and oriented to person, place, and time. She has normal reflexes.  Skin: Skin is warm and dry. No rash noted. She is not diaphoretic. No erythema. No pallor.  Psychiatric: She has a normal mood and affect. Her behavior is normal. Judgment and thought content normal.  Nursing note and vitals reviewed.   Infusion on 03/05/2019  Component Date Value Ref Range Status   Sodium 03/05/2019 139  135 - 145 mmol/L Final   Potassium 03/05/2019 3.9  3.5 - 5.1 mmol/L Final   Chloride 03/05/2019 106  98 - 111 mmol/L Final   CO2 03/05/2019 26  22 - 32 mmol/L Final   Glucose, Bld 03/05/2019 100* 70 - 99 mg/dL Final   BUN 03/05/2019 17  8 - 23 mg/dL Final   Creatinine, Ser 03/05/2019 0.57  0.44 - 1.00 mg/dL Final   Calcium 03/05/2019 8.8* 8.9 - 10.3 mg/dL Final   Total Protein 03/05/2019 6.3* 6.5 - 8.1 g/dL Final   Albumin 03/05/2019 3.3* 3.5 - 5.0 g/dL Final   AST 03/05/2019 21  15 - 41 U/L Final   ALT 03/05/2019 17  0 - 44 U/L Final   Alkaline Phosphatase 03/05/2019 53   38 - 126 U/L Final   Total Bilirubin 03/05/2019 0.3  0.3 - 1.2 mg/dL Final   GFR calc non Af Amer 03/05/2019 >60  >60 mL/min  Final   GFR calc Af Amer 03/05/2019 >60  >60 mL/min Final   Anion gap 03/05/2019 7  5 - 15 Final   Performed at [facility], [address] [place], [place] [address]   WBC 03/05/2019 7.9  4.0 - 10.5 K/uL Final   RBC 03/05/2019 3.84* 3.87 - 5.11 MIL/uL Final   Hemoglobin 03/05/2019 12.1  12.0 - 15.0 g/dL Final   HCT 03/05/2019 36.4  36.0 - 46.0 % Final   MCV 03/05/2019 94.8  80.0 - 100.0 fL Final   MCH 03/05/2019 31.5  26.0 - 34.0 pg Final   MCHC 03/05/2019 33.2  30.0 - 36.0 g/dL Final   RDW 03/05/2019 13.5  11.5 - 15.5 % Final   Platelets 03/05/2019 218  150 - 400 K/uL Final   nRBC 03/05/2019 0.0  0.0 - 0.2 % Final   Neutrophils Relative % 03/05/2019 62  % Final   Neutro Abs 03/05/2019 4.9  1.7 - 7.7 K/uL Final   Lymphocytes Relative 03/05/2019 26  % Final   Lymphs Abs 03/05/2019 2.1  0.7 - 4.0 K/uL Final   Monocytes Relative 03/05/2019 10  % Final   Monocytes Absolute 03/05/2019 0.8  0.1 - 1.0 K/uL Final   Eosinophils Relative 03/05/2019 1  % Final   Eosinophils Absolute 03/05/2019 0.1  0.0 - 0.5 K/uL Final   Basophils Relative 03/05/2019 1  % Final   Basophils Absolute 03/05/2019 0.1  0.0 - 0.1 K/uL Final   Immature Granulocytes 03/05/2019 0  % Final   Abs Immature Granulocytes 03/05/2019 0.02  0.00 - 0.07 K/uL Final   Performed at [facility], [address] [place], [place] [address]    Assessment:  LIYLA RADLIFF is a 85 y.o. female with stage I Her2/neu + breast cancer s/p lumpectomy and sentinel lymph node biopsy on 03/18/2018. Pathologyrevealed a 0.6 cm grade III invasive mammary carcinoma. There was DCIS. There was no lymphovascular invasion. Tumor was ER + (99%), PR + (25%), and Her2/neu + by FISH. Pathologic stagewas T1bN0.  Screening mammogram on 01/17/2018 revealed calcifications  and possible mass in the right breast. Diagnostic mammogram and ultrasoundon 01/22/2018 revealed indeterminate group of microcalcifications over the right upper outer quadrant spanning 4 x 9 x 11 mm. There was a 4 x 7 x 7 mm probable complicated cyst at the 11 o'clock position of the right breast 5 cm from the nipple.  Bilateral diagnostic mammogram at [facility] on 02/06/2019 revealed no evidence of malignancy.   Shereceived12 weeks ofHerceptin and Taxol (05/20/2018 - 08/05/2018).She has continued weekly Herceptin(09/05/2018; 09/26/2018 - 01/08/2019).Herceptin was held on06/09/2018.  Herceptin was switched to [place] on 01/15/2019 (last 02/26/2019).  Shereceivedwhole breastradiationfrom04/11/2018 - 11/05/2018.Tamoxifenbegan 11/20/2018.  CA27.29 has been followed: 10.7 on 10/03/2018, < 3.5 on 10/16/2018, 8.3 on 12/11/2018, and < 3.5 on 02/05/2019.  Echoat [facility] on 03/12/2018 revealed an EF of 61%. Echo at [facility] on03/02/2019 revealed an LVEF of 50 to 55%. There was a trivial pericardial effusion waspresent.Echoon 11/12/2018 revealed an EFof 45-50%. Echoon 12/10/2018 revealed an EFof55-60%.  Echo on 02/11/2019 revealed an EF of 50-55%.  He has a history of stage IIB ovarian cancer s/p exploratory laparotomy, TAH/BSO, omentectomy, peritoneal biopsies, selective pelvic and periaortic lymph node sampling on 06/20/2001. Pathologyrevealed a grade 1 papillary serous carcinoma of the ovary. Uterine serosa and the surface of the contralateral ovary were positive for metastatic disease. The primary tumor was approximately 15 cm in diameter. She received 6 cycles ofcarboplatin and Taxol.  Bone densityon 04/01/2017 revealed osteoporosiswith a T-score of -2.5 in the left femoral neck.  She began Prolia on 01/26/2019.  She has grade I neuropathyin her feet. She has chronic right lower extremity edema. Right lower extremity duplexon 07/22/2018 revealed no femoropopliteal  and no calf DVT in the visualized calf veins.  Symptomatically, she is doing well.  She voices no concerns.  Exam is stable.  Plan: 1.  Labs today: CBC with diff, CMP, CA 27.29.  2.Stage I HER-2/neu (+) breast cancer Clinically, she is doing well. Radiation completed on05/27/2020. She began tamoxifen on06/04/2019. She continues Kanjinti (Herceptin biosimilar) for 1 year (52 weeks) of adjuvant therapy. Kanjinti (Herceptin biosimilar) today and weekly x7. 3.Chemotherapy-induced neuropathy Neuropathy is mild. Continue to monitor. 4.Osteoporosis Continue calcium and vitamin D. She received Prolia on 01/26/2019 with Dr. Nicki Reaper. Bone density on 04/02/2019. 5.Decline in ejection fraction (EF) Echo on 08/18/2018 revealed anEFof50-55%. Echo on 11/12/2018 revealed anEFof 45-50%. Echo on07/01/2020revealed an EF of55-60%.  Echo on 02/11/2019 revealed an EF of 50-55%. Schedule echo on 05/13/2019.  Continue close monitoring. 6.RTC in 8 weeks for MD assessment, labs (CBC with diff, CMP, CA125), review of echo and Kanjinti.  Addendum:  Review of notes from Dr. Silvio Pate at [facility] reveals patient taking letrozole.  Call patient to confirm.   I discussed the assessment and treatment plan with the patient.  The patient was provided an opportunity to ask questions and all were answered.  The patient agreed with the plan and demonstrated an understanding of the instructions.  The patient was advised to call back if the symptoms worsen or if the condition fails to improve as anticipated.   Lequita Asal, MD, PhD    03/05/2019, 10:28 AM  I, Selena Batten, am acting as scribe for [employer]. Mike Gip, MD, PhD.  I, Won Kreuzer C. Mike Gip, MD, have reviewed the above documentation for accuracy and completeness, and I agree with the above.

## 2019-03-03 NOTE — Progress Notes (Signed)
Patient is coming in for follow up she is doing well no complaints  

## 2019-03-05 ENCOUNTER — Encounter: Payer: Self-pay | Admitting: Hematology and Oncology

## 2019-03-05 ENCOUNTER — Inpatient Hospital Stay: Payer: Medicare HMO

## 2019-03-05 ENCOUNTER — Other Ambulatory Visit: Payer: Self-pay

## 2019-03-05 ENCOUNTER — Inpatient Hospital Stay (HOSPITAL_BASED_OUTPATIENT_CLINIC_OR_DEPARTMENT_OTHER): Payer: Medicare HMO | Admitting: Hematology and Oncology

## 2019-03-05 VITALS — BP 152/78 | HR 65 | Temp 97.6°F | Resp 17

## 2019-03-05 VITALS — BP 131/73 | HR 61 | Temp 97.5°F | Resp 18 | Wt 166.6 lb

## 2019-03-05 DIAGNOSIS — R350 Frequency of micturition: Secondary | ICD-10-CM | POA: Diagnosis not present

## 2019-03-05 DIAGNOSIS — C50411 Malignant neoplasm of upper-outer quadrant of right female breast: Secondary | ICD-10-CM

## 2019-03-05 DIAGNOSIS — Z5112 Encounter for antineoplastic immunotherapy: Secondary | ICD-10-CM | POA: Diagnosis not present

## 2019-03-05 DIAGNOSIS — M810 Age-related osteoporosis without current pathological fracture: Secondary | ICD-10-CM

## 2019-03-05 DIAGNOSIS — Z8543 Personal history of malignant neoplasm of ovary: Secondary | ICD-10-CM | POA: Diagnosis not present

## 2019-03-05 DIAGNOSIS — I10 Essential (primary) hypertension: Secondary | ICD-10-CM | POA: Diagnosis not present

## 2019-03-05 DIAGNOSIS — Z923 Personal history of irradiation: Secondary | ICD-10-CM | POA: Diagnosis not present

## 2019-03-05 DIAGNOSIS — R931 Abnormal findings on diagnostic imaging of heart and coronary circulation: Secondary | ICD-10-CM | POA: Diagnosis not present

## 2019-03-05 DIAGNOSIS — Z5111 Encounter for antineoplastic chemotherapy: Secondary | ICD-10-CM | POA: Diagnosis not present

## 2019-03-05 DIAGNOSIS — G620 Drug-induced polyneuropathy: Secondary | ICD-10-CM | POA: Diagnosis not present

## 2019-03-05 DIAGNOSIS — Z170 Estrogen receptor positive status [ER+]: Secondary | ICD-10-CM | POA: Diagnosis not present

## 2019-03-05 DIAGNOSIS — T451X5A Adverse effect of antineoplastic and immunosuppressive drugs, initial encounter: Secondary | ICD-10-CM

## 2019-03-05 DIAGNOSIS — M2550 Pain in unspecified joint: Secondary | ICD-10-CM | POA: Diagnosis not present

## 2019-03-05 DIAGNOSIS — Z79810 Long term (current) use of selective estrogen receptor modulators (SERMs): Secondary | ICD-10-CM | POA: Diagnosis not present

## 2019-03-05 LAB — COMPREHENSIVE METABOLIC PANEL
ALT: 17 U/L (ref 0–44)
AST: 21 U/L (ref 15–41)
Albumin: 3.3 g/dL — ABNORMAL LOW (ref 3.5–5.0)
Alkaline Phosphatase: 53 U/L (ref 38–126)
Anion gap: 7 (ref 5–15)
BUN: 17 mg/dL (ref 8–23)
CO2: 26 mmol/L (ref 22–32)
Calcium: 8.8 mg/dL — ABNORMAL LOW (ref 8.9–10.3)
Chloride: 106 mmol/L (ref 98–111)
Creatinine, Ser: 0.57 mg/dL (ref 0.44–1.00)
GFR calc Af Amer: >60 mL/min (ref >60)
GFR calc non Af Amer: >60 mL/min (ref >60)
Glucose, Bld: 100 mg/dL — ABNORMAL HIGH (ref 70–99)
Potassium: 3.9 mmol/L (ref 3.5–5.1)
Sodium: 139 mmol/L (ref 135–145)
Total Bilirubin: 0.3 mg/dL (ref 0.3–1.2)
Total Protein: 6.3 g/dL — ABNORMAL LOW (ref 6.5–8.1)

## 2019-03-05 LAB — CBC WITH DIFFERENTIAL/PLATELET
Abs Immature Granulocytes: 0.02 10*3/uL (ref 0.00–0.07)
Basophils Absolute: 0.1 10*3/uL (ref 0.0–0.1)
Basophils Relative: 1 %
Eosinophils Absolute: 0.1 10*3/uL (ref 0.0–0.5)
Eosinophils Relative: 1 %
HCT: 36.4 % (ref 36.0–46.0)
Hemoglobin: 12.1 g/dL (ref 12.0–15.0)
Immature Granulocytes: 0 %
Lymphocytes Relative: 26 %
Lymphs Abs: 2.1 10*3/uL (ref 0.7–4.0)
MCH: 31.5 pg (ref 26.0–34.0)
MCHC: 33.2 g/dL (ref 30.0–36.0)
MCV: 94.8 fL (ref 80.0–100.0)
Monocytes Absolute: 0.8 10*3/uL (ref 0.1–1.0)
Monocytes Relative: 10 %
Neutro Abs: 4.9 10*3/uL (ref 1.7–7.7)
Neutrophils Relative %: 62 %
Platelets: 218 10*3/uL (ref 150–400)
RBC: 3.84 MIL/uL — ABNORMAL LOW (ref 3.87–5.11)
RDW: 13.5 % (ref 11.5–15.5)
WBC: 7.9 10*3/uL (ref 4.0–10.5)
nRBC: 0 % (ref 0.0–0.2)

## 2019-03-05 MED ORDER — TRASTUZUMAB-ANNS CHEMO 150 MG IV SOLR
150.0000 mg | Freq: Once | INTRAVENOUS | Status: AC
  Administered 2019-03-05: 150 mg via INTRAVENOUS
  Filled 2019-03-05: qty 7.1

## 2019-03-05 MED ORDER — SODIUM CHLORIDE 0.9% FLUSH
10.0000 mL | INTRAVENOUS | Status: DC | PRN
  Administered 2019-03-05: 10:00:00 10 mL via INTRAVENOUS
  Filled 2019-03-05: qty 10

## 2019-03-05 MED ORDER — ACETAMINOPHEN 325 MG PO TABS
650.0000 mg | ORAL_TABLET | Freq: Once | ORAL | Status: AC
  Administered 2019-03-05: 650 mg via ORAL
  Filled 2019-03-05: qty 2

## 2019-03-05 MED ORDER — SODIUM CHLORIDE 0.9 % IV SOLN
Freq: Once | INTRAVENOUS | Status: AC
  Administered 2019-03-05: 11:00:00 via INTRAVENOUS
  Filled 2019-03-05: qty 250

## 2019-03-05 MED ORDER — HEPARIN SOD (PORK) LOCK FLUSH 100 UNIT/ML IV SOLN
500.0000 [IU] | Freq: Once | INTRAVENOUS | Status: AC
  Administered 2019-03-05: 500 [IU] via INTRAVENOUS
  Filled 2019-03-05: qty 5

## 2019-03-05 NOTE — Progress Notes (Signed)
Pt in for follow up, pre assessment done on 03/03/19 per phone call. Pt denies any changes since then.

## 2019-03-06 LAB — CANCER ANTIGEN 27.29: CA 27.29: 5.1 U/mL (ref 0.0–38.6)

## 2019-03-12 ENCOUNTER — Other Ambulatory Visit: Payer: Self-pay

## 2019-03-12 ENCOUNTER — Telehealth: Payer: Self-pay

## 2019-03-12 ENCOUNTER — Inpatient Hospital Stay: Payer: Medicare HMO | Attending: Hematology and Oncology

## 2019-03-12 VITALS — BP 132/62 | HR 58 | Temp 97.4°F | Resp 16

## 2019-03-12 DIAGNOSIS — Z170 Estrogen receptor positive status [ER+]: Secondary | ICD-10-CM | POA: Diagnosis not present

## 2019-03-12 DIAGNOSIS — C50411 Malignant neoplasm of upper-outer quadrant of right female breast: Secondary | ICD-10-CM

## 2019-03-12 DIAGNOSIS — Z5111 Encounter for antineoplastic chemotherapy: Secondary | ICD-10-CM | POA: Insufficient documentation

## 2019-03-12 MED ORDER — SODIUM CHLORIDE 0.9% FLUSH
10.0000 mL | INTRAVENOUS | Status: DC | PRN
  Administered 2019-03-12: 10 mL
  Filled 2019-03-12: qty 10

## 2019-03-12 MED ORDER — TAMOXIFEN CITRATE 20 MG PO TABS: 20.0000 mg | ORAL_TABLET | Freq: Every day | ORAL | 1 refills | Status: DC

## 2019-03-12 MED ORDER — HEPARIN SOD (PORK) LOCK FLUSH 100 UNIT/ML IV SOLN
500.0000 [IU] | Freq: Once | INTRAVENOUS | Status: AC | PRN
  Administered 2019-03-12: 500 [IU]

## 2019-03-12 MED ORDER — ACETAMINOPHEN 325 MG PO TABS
650.0000 mg | ORAL_TABLET | Freq: Once | ORAL | Status: AC
  Administered 2019-03-12: 650 mg via ORAL
  Filled 2019-03-12: qty 2

## 2019-03-12 MED ORDER — SODIUM CHLORIDE 0.9 % IV SOLN
Freq: Once | INTRAVENOUS | Status: AC
  Administered 2019-03-12: 14:00:00 via INTRAVENOUS
  Filled 2019-03-12: qty 250

## 2019-03-12 MED ORDER — TRASTUZUMAB-ANNS CHEMO 150 MG IV SOLR
150.0000 mg | Freq: Once | INTRAVENOUS | Status: AC
  Administered 2019-03-12: 150 mg via INTRAVENOUS
  Filled 2019-03-12: qty 7.14

## 2019-03-12 NOTE — Telephone Encounter (Signed)
Spoke with the patient to see if she was taking Tamoxifen 20 mg tapo daily, she has been taken it for x 1 month. She has never taken Femara per the patient. The patient states she will need a refill Dr Mike Gip is aware.

## 2019-03-19 ENCOUNTER — Inpatient Hospital Stay: Payer: Medicare HMO

## 2019-03-19 ENCOUNTER — Other Ambulatory Visit: Payer: Self-pay

## 2019-03-19 VITALS — BP 136/74 | HR 68 | Temp 98.1°F | Resp 18

## 2019-03-19 DIAGNOSIS — C50411 Malignant neoplasm of upper-outer quadrant of right female breast: Secondary | ICD-10-CM

## 2019-03-19 DIAGNOSIS — Z170 Estrogen receptor positive status [ER+]: Secondary | ICD-10-CM

## 2019-03-19 DIAGNOSIS — Z5111 Encounter for antineoplastic chemotherapy: Secondary | ICD-10-CM | POA: Diagnosis not present

## 2019-03-19 MED ORDER — ACETAMINOPHEN 325 MG PO TABS
650.0000 mg | ORAL_TABLET | Freq: Once | ORAL | Status: AC
  Administered 2019-03-19: 650 mg via ORAL
  Filled 2019-03-19: qty 2

## 2019-03-19 MED ORDER — SODIUM CHLORIDE 0.9 % IV SOLN
Freq: Once | INTRAVENOUS | Status: AC
  Administered 2019-03-19: 14:00:00 via INTRAVENOUS
  Filled 2019-03-19: qty 250

## 2019-03-19 MED ORDER — TRASTUZUMAB-ANNS CHEMO 150 MG IV SOLR
150.0000 mg | Freq: Once | INTRAVENOUS | Status: AC
  Administered 2019-03-19: 150 mg via INTRAVENOUS
  Filled 2019-03-19: qty 7.1

## 2019-03-19 MED ORDER — TRASTUZUMAB-ANNS CHEMO 150 MG IV SOLR
150.0000 mg | Freq: Once | INTRAVENOUS | Status: DC
  Filled 2019-03-19: qty 7.1

## 2019-03-19 MED ORDER — SODIUM CHLORIDE 0.9% FLUSH
10.0000 mL | INTRAVENOUS | Status: DC | PRN
  Administered 2019-03-19: 10 mL
  Filled 2019-03-19: qty 10

## 2019-03-19 MED ORDER — HEPARIN SOD (PORK) LOCK FLUSH 100 UNIT/ML IV SOLN
500.0000 [IU] | Freq: Once | INTRAVENOUS | Status: AC | PRN
  Administered 2019-03-19: 500 [IU]
  Filled 2019-03-19: qty 5

## 2019-03-25 ENCOUNTER — Other Ambulatory Visit: Payer: Self-pay

## 2019-03-26 ENCOUNTER — Other Ambulatory Visit: Payer: Self-pay | Admitting: Hematology and Oncology

## 2019-03-26 ENCOUNTER — Inpatient Hospital Stay: Payer: Medicare HMO

## 2019-03-26 VITALS — BP 147/78 | HR 59 | Temp 97.2°F | Resp 16

## 2019-03-26 DIAGNOSIS — C50411 Malignant neoplasm of upper-outer quadrant of right female breast: Secondary | ICD-10-CM | POA: Diagnosis not present

## 2019-03-26 DIAGNOSIS — Z5111 Encounter for antineoplastic chemotherapy: Secondary | ICD-10-CM | POA: Diagnosis not present

## 2019-03-26 DIAGNOSIS — Z170 Estrogen receptor positive status [ER+]: Secondary | ICD-10-CM | POA: Diagnosis not present

## 2019-03-26 MED ORDER — TRASTUZUMAB-ANNS CHEMO 150 MG IV SOLR
150.0000 mg | Freq: Once | INTRAVENOUS | Status: AC
  Administered 2019-03-26: 14:00:00 150 mg via INTRAVENOUS
  Filled 2019-03-26: qty 7.1
  Filled 2019-03-26: qty 7.14

## 2019-03-26 MED ORDER — ACETAMINOPHEN 325 MG PO TABS
650.0000 mg | ORAL_TABLET | Freq: Once | ORAL | Status: AC
  Administered 2019-03-26: 650 mg via ORAL

## 2019-03-26 MED ORDER — SODIUM CHLORIDE 0.9 % IV SOLN
Freq: Once | INTRAVENOUS | Status: AC
  Administered 2019-03-26: 14:00:00 via INTRAVENOUS
  Filled 2019-03-26: qty 250

## 2019-03-26 MED ORDER — HEPARIN SOD (PORK) LOCK FLUSH 100 UNIT/ML IV SOLN
500.0000 [IU] | Freq: Once | INTRAVENOUS | Status: AC | PRN
  Administered 2019-03-26: 15:00:00 500 [IU]

## 2019-03-26 MED ORDER — SODIUM CHLORIDE 0.9% FLUSH
10.0000 mL | INTRAVENOUS | Status: DC | PRN
  Administered 2019-03-26: 10 mL
  Filled 2019-03-26: qty 10

## 2019-03-26 NOTE — Patient Instructions (Signed)
Trastuzumab injection for infusion What is this medicine? TRASTUZUMAB (tras TOO zoo mab) is a monoclonal antibody. It is used to treat breast cancer and stomach cancer. This medicine may be used for other purposes; ask your health care provider or pharmacist if you have questions. COMMON BRAND NAME(S): Herceptin, Herzuma, KANJINTI, Ogivri, Ontruzant, Trazimera What should I tell my health care provider before I take this medicine? They need to know if you have any of these conditions:  heart disease  heart failure  lung or breathing disease, like asthma  an unusual or allergic reaction to trastuzumab, benzyl alcohol, or other medications, foods, dyes, or preservatives  pregnant or trying to get pregnant  breast-feeding How should I use this medicine? This drug is given as an infusion into a vein. It is administered in a hospital or clinic by a specially trained health care professional. Talk to your pediatrician regarding the use of this medicine in children. This medicine is not approved for use in children. Overdosage: If you think you have taken too much of this medicine contact a poison control center or emergency room at once. NOTE: This medicine is only for you. Do not share this medicine with others. What if I miss a dose? It is important not to miss a dose. Call your doctor or health care professional if you are unable to keep an appointment. What may interact with this medicine? This medicine may interact with the following medications:  certain types of chemotherapy, such as daunorubicin, doxorubicin, epirubicin, and idarubicin This list may not describe all possible interactions. Give your health care provider a list of all the medicines, herbs, non-prescription drugs, or dietary supplements you use. Also tell them if you smoke, drink alcohol, or use illegal drugs. Some items may interact with your medicine. What should I watch for while using this medicine? Visit your  doctor for checks on your progress. Report any side effects. Continue your course of treatment even though you feel ill unless your doctor tells you to stop. Call your doctor or health care professional for advice if you get a fever, chills or sore throat, or other symptoms of a cold or flu. Do not treat yourself. Try to avoid being around people who are sick. You may experience fever, chills and shaking during your first infusion. These effects are usually mild and can be treated with other medicines. Report any side effects during the infusion to your health care professional. Fever and chills usually do not happen with later infusions. Do not become pregnant while taking this medicine or for 7 months after stopping it. Women should inform their doctor if they wish to become pregnant or think they might be pregnant. Women of child-bearing potential will need to have a negative pregnancy test before starting this medicine. There is a potential for serious side effects to an unborn child. Talk to your health care professional or pharmacist for more information. Do not breast-feed an infant while taking this medicine or for 7 months after stopping it. Women must use effective birth control with this medicine. What side effects may I notice from receiving this medicine? Side effects that you should report to your doctor or health care professional as soon as possible:  allergic reactions like skin rash, itching or hives, swelling of the face, lips, or tongue  chest pain or palpitations  cough  dizziness  feeling faint or lightheaded, falls  fever  general ill feeling or flu-like symptoms  signs of worsening heart failure like   breathing problems; swelling in your legs and feet  unusually weak or tired Side effects that usually do not require medical attention (report to your doctor or health care professional if they continue or are bothersome):  bone pain  changes in  taste  diarrhea  joint pain  nausea/vomiting  weight loss This list may not describe all possible side effects. Call your doctor for medical advice about side effects. You may report side effects to FDA at 1-800-FDA-1088. Where should I keep my medicine? This drug is given in a hospital or clinic and will not be stored at home. NOTE: This sheet is a summary. It may not cover all possible information. If you have questions about this medicine, talk to your doctor, pharmacist, or health care provider.  2020 Elsevier/Gold Standard (2016-05-22 14:37:52)  

## 2019-03-31 ENCOUNTER — Other Ambulatory Visit: Payer: Self-pay | Admitting: Internal Medicine

## 2019-04-01 ENCOUNTER — Other Ambulatory Visit: Payer: Self-pay

## 2019-04-02 ENCOUNTER — Inpatient Hospital Stay: Payer: Medicare HMO

## 2019-04-02 VITALS — BP 145/76 | HR 81 | Temp 97.8°F | Resp 16

## 2019-04-02 DIAGNOSIS — C50411 Malignant neoplasm of upper-outer quadrant of right female breast: Secondary | ICD-10-CM | POA: Diagnosis not present

## 2019-04-02 DIAGNOSIS — Z5111 Encounter for antineoplastic chemotherapy: Secondary | ICD-10-CM | POA: Diagnosis not present

## 2019-04-02 DIAGNOSIS — Z170 Estrogen receptor positive status [ER+]: Secondary | ICD-10-CM | POA: Diagnosis not present

## 2019-04-02 MED ORDER — TRASTUZUMAB-ANNS CHEMO 150 MG IV SOLR
150.0000 mg | Freq: Once | INTRAVENOUS | Status: AC
  Administered 2019-04-02: 150 mg via INTRAVENOUS
  Filled 2019-04-02: qty 7.14

## 2019-04-02 MED ORDER — ACETAMINOPHEN 325 MG PO TABS
650.0000 mg | ORAL_TABLET | Freq: Once | ORAL | Status: AC
  Administered 2019-04-02: 650 mg via ORAL
  Filled 2019-04-02: qty 2

## 2019-04-02 MED ORDER — SODIUM CHLORIDE 0.9 % IV SOLN
Freq: Once | INTRAVENOUS | Status: AC
  Administered 2019-04-02: 14:00:00 via INTRAVENOUS
  Filled 2019-04-02: qty 250

## 2019-04-02 MED ORDER — SODIUM CHLORIDE 0.9% FLUSH
10.0000 mL | INTRAVENOUS | Status: DC | PRN
  Administered 2019-04-02: 10 mL
  Filled 2019-04-02: qty 10

## 2019-04-02 MED ORDER — HEPARIN SOD (PORK) LOCK FLUSH 100 UNIT/ML IV SOLN
500.0000 [IU] | Freq: Once | INTRAVENOUS | Status: AC | PRN
  Administered 2019-04-02: 500 [IU]
  Filled 2019-04-02: qty 5

## 2019-04-02 NOTE — Patient Instructions (Signed)
Trastuzumab injection for infusion What is this medicine? TRASTUZUMAB (tras TOO zoo mab) is a monoclonal antibody. It is used to treat breast cancer and stomach cancer. This medicine may be used for other purposes; ask your health care provider or pharmacist if you have questions. COMMON BRAND NAME(S): Herceptin, Herzuma, KANJINTI, Ogivri, Ontruzant, Trazimera What should I tell my health care provider before I take this medicine? They need to know if you have any of these conditions:  heart disease  heart failure  lung or breathing disease, like asthma  an unusual or allergic reaction to trastuzumab, benzyl alcohol, or other medications, foods, dyes, or preservatives  pregnant or trying to get pregnant  breast-feeding How should I use this medicine? This drug is given as an infusion into a vein. It is administered in a hospital or clinic by a specially trained health care professional. Talk to your pediatrician regarding the use of this medicine in children. This medicine is not approved for use in children. Overdosage: If you think you have taken too much of this medicine contact a poison control center or emergency room at once. NOTE: This medicine is only for you. Do not share this medicine with others. What if I miss a dose? It is important not to miss a dose. Call your doctor or health care professional if you are unable to keep an appointment. What may interact with this medicine? This medicine may interact with the following medications:  certain types of chemotherapy, such as daunorubicin, doxorubicin, epirubicin, and idarubicin This list may not describe all possible interactions. Give your health care provider a list of all the medicines, herbs, non-prescription drugs, or dietary supplements you use. Also tell them if you smoke, drink alcohol, or use illegal drugs. Some items may interact with your medicine. What should I watch for while using this medicine? Visit your  doctor for checks on your progress. Report any side effects. Continue your course of treatment even though you feel ill unless your doctor tells you to stop. Call your doctor or health care professional for advice if you get a fever, chills or sore throat, or other symptoms of a cold or flu. Do not treat yourself. Try to avoid being around people who are sick. You may experience fever, chills and shaking during your first infusion. These effects are usually mild and can be treated with other medicines. Report any side effects during the infusion to your health care professional. Fever and chills usually do not happen with later infusions. Do not become pregnant while taking this medicine or for 7 months after stopping it. Women should inform their doctor if they wish to become pregnant or think they might be pregnant. Women of child-bearing potential will need to have a negative pregnancy test before starting this medicine. There is a potential for serious side effects to an unborn child. Talk to your health care professional or pharmacist for more information. Do not breast-feed an infant while taking this medicine or for 7 months after stopping it. Women must use effective birth control with this medicine. What side effects may I notice from receiving this medicine? Side effects that you should report to your doctor or health care professional as soon as possible:  allergic reactions like skin rash, itching or hives, swelling of the face, lips, or tongue  chest pain or palpitations  cough  dizziness  feeling faint or lightheaded, falls  fever  general ill feeling or flu-like symptoms  signs of worsening heart failure like   breathing problems; swelling in your legs and feet  unusually weak or tired Side effects that usually do not require medical attention (report to your doctor or health care professional if they continue or are bothersome):  bone pain  changes in  taste  diarrhea  joint pain  nausea/vomiting  weight loss This list may not describe all possible side effects. Call your doctor for medical advice about side effects. You may report side effects to FDA at 1-800-FDA-1088. Where should I keep my medicine? This drug is given in a hospital or clinic and will not be stored at home. NOTE: This sheet is a summary. It may not cover all possible information. If you have questions about this medicine, talk to your doctor, pharmacist, or health care provider.  2020 Elsevier/Gold Standard (2016-05-22 14:37:52)  

## 2019-04-06 ENCOUNTER — Ambulatory Visit
Admission: RE | Admit: 2019-04-06 | Discharge: 2019-04-06 | Disposition: A | Discharge status: Home / Self Care | Payer: Medicare HMO | Source: Ambulatory Visit | Attending: Hematology and Oncology | Admitting: Hematology and Oncology

## 2019-04-06 DIAGNOSIS — M810 Age-related osteoporosis without current pathological fracture: Secondary | ICD-10-CM | POA: Insufficient documentation

## 2019-04-09 ENCOUNTER — Inpatient Hospital Stay: Payer: Medicare HMO

## 2019-04-09 ENCOUNTER — Other Ambulatory Visit: Payer: Self-pay

## 2019-04-09 VITALS — BP 125/85 | HR 62 | Temp 97.1°F | Resp 16

## 2019-04-09 DIAGNOSIS — C50411 Malignant neoplasm of upper-outer quadrant of right female breast: Secondary | ICD-10-CM

## 2019-04-09 DIAGNOSIS — Z170 Estrogen receptor positive status [ER+]: Secondary | ICD-10-CM

## 2019-04-09 DIAGNOSIS — Z5111 Encounter for antineoplastic chemotherapy: Secondary | ICD-10-CM | POA: Diagnosis not present

## 2019-04-09 MED ORDER — TRASTUZUMAB-ANNS CHEMO 150 MG IV SOLR
150.0000 mg | Freq: Once | INTRAVENOUS | Status: AC
  Administered 2019-04-09: 150 mg via INTRAVENOUS
  Filled 2019-04-09: qty 7.14

## 2019-04-09 MED ORDER — SODIUM CHLORIDE 0.9 % IV SOLN
Freq: Once | INTRAVENOUS | Status: AC
  Administered 2019-04-09: 14:00:00 via INTRAVENOUS
  Filled 2019-04-09: qty 250

## 2019-04-09 MED ORDER — ACETAMINOPHEN 325 MG PO TABS
650.0000 mg | ORAL_TABLET | Freq: Once | ORAL | Status: AC
  Administered 2019-04-09: 14:00:00 650 mg via ORAL
  Filled 2019-04-09: qty 2

## 2019-04-09 MED ORDER — SODIUM CHLORIDE 0.9% FLUSH
10.0000 mL | INTRAVENOUS | Status: DC | PRN
  Administered 2019-04-09: 13:00:00 10 mL
  Filled 2019-04-09: qty 10

## 2019-04-09 MED ORDER — HEPARIN SOD (PORK) LOCK FLUSH 100 UNIT/ML IV SOLN
500.0000 [IU] | Freq: Once | INTRAVENOUS | Status: AC | PRN
  Administered 2019-04-09: 15:00:00 500 [IU]
  Filled 2019-04-09: qty 5

## 2019-04-16 ENCOUNTER — Inpatient Hospital Stay: Payer: Medicare HMO | Attending: Hematology and Oncology

## 2019-04-16 ENCOUNTER — Other Ambulatory Visit: Payer: Self-pay

## 2019-04-16 VITALS — BP 127/70 | HR 61 | Temp 97.6°F | Resp 17

## 2019-04-16 DIAGNOSIS — M810 Age-related osteoporosis without current pathological fracture: Secondary | ICD-10-CM | POA: Diagnosis not present

## 2019-04-16 DIAGNOSIS — Z8543 Personal history of malignant neoplasm of ovary: Secondary | ICD-10-CM | POA: Diagnosis not present

## 2019-04-16 DIAGNOSIS — Z79810 Long term (current) use of selective estrogen receptor modulators (SERMs): Secondary | ICD-10-CM | POA: Insufficient documentation

## 2019-04-16 DIAGNOSIS — E785 Hyperlipidemia, unspecified: Secondary | ICD-10-CM | POA: Diagnosis not present

## 2019-04-16 DIAGNOSIS — Z5111 Encounter for antineoplastic chemotherapy: Secondary | ICD-10-CM | POA: Diagnosis present

## 2019-04-16 DIAGNOSIS — Z170 Estrogen receptor positive status [ER+]: Secondary | ICD-10-CM | POA: Insufficient documentation

## 2019-04-16 DIAGNOSIS — C50411 Malignant neoplasm of upper-outer quadrant of right female breast: Secondary | ICD-10-CM | POA: Diagnosis present

## 2019-04-16 DIAGNOSIS — M1710 Unilateral primary osteoarthritis, unspecified knee: Secondary | ICD-10-CM | POA: Diagnosis not present

## 2019-04-16 DIAGNOSIS — I10 Essential (primary) hypertension: Secondary | ICD-10-CM | POA: Insufficient documentation

## 2019-04-16 DIAGNOSIS — Z79899 Other long term (current) drug therapy: Secondary | ICD-10-CM | POA: Diagnosis not present

## 2019-04-16 MED ORDER — TRASTUZUMAB-ANNS CHEMO 150 MG IV SOLR
150.0000 mg | Freq: Once | INTRAVENOUS | Status: AC
  Administered 2019-04-16: 14:00:00 150 mg via INTRAVENOUS
  Filled 2019-04-16: qty 7.14

## 2019-04-16 MED ORDER — SODIUM CHLORIDE 0.9% FLUSH
10.0000 mL | INTRAVENOUS | Status: DC | PRN
  Administered 2019-04-16: 10 mL
  Filled 2019-04-16: qty 10

## 2019-04-16 MED ORDER — SODIUM CHLORIDE 0.9 % IV SOLN
Freq: Once | INTRAVENOUS | Status: AC
  Administered 2019-04-16: 14:00:00 via INTRAVENOUS
  Filled 2019-04-16: qty 250

## 2019-04-16 MED ORDER — ACETAMINOPHEN 325 MG PO TABS
650.0000 mg | ORAL_TABLET | Freq: Once | ORAL | Status: AC
  Administered 2019-04-16: 650 mg via ORAL

## 2019-04-16 MED ORDER — HEPARIN SOD (PORK) LOCK FLUSH 100 UNIT/ML IV SOLN
500.0000 [IU] | Freq: Once | INTRAVENOUS | Status: AC | PRN
  Administered 2019-04-16: 15:00:00 500 [IU]
  Filled 2019-04-16: qty 5

## 2019-04-22 ENCOUNTER — Other Ambulatory Visit: Payer: Self-pay

## 2019-04-23 ENCOUNTER — Inpatient Hospital Stay: Payer: Medicare HMO

## 2019-04-23 VITALS — BP 136/76 | HR 76 | Temp 97.0°F | Resp 18

## 2019-04-23 DIAGNOSIS — C50411 Malignant neoplasm of upper-outer quadrant of right female breast: Secondary | ICD-10-CM | POA: Diagnosis not present

## 2019-04-23 DIAGNOSIS — Z5111 Encounter for antineoplastic chemotherapy: Secondary | ICD-10-CM | POA: Diagnosis not present

## 2019-04-23 DIAGNOSIS — E785 Hyperlipidemia, unspecified: Secondary | ICD-10-CM | POA: Diagnosis not present

## 2019-04-23 DIAGNOSIS — Z8543 Personal history of malignant neoplasm of ovary: Secondary | ICD-10-CM | POA: Diagnosis not present

## 2019-04-23 DIAGNOSIS — M1710 Unilateral primary osteoarthritis, unspecified knee: Secondary | ICD-10-CM | POA: Diagnosis not present

## 2019-04-23 DIAGNOSIS — Z79810 Long term (current) use of selective estrogen receptor modulators (SERMs): Secondary | ICD-10-CM | POA: Diagnosis not present

## 2019-04-23 DIAGNOSIS — Z79899 Other long term (current) drug therapy: Secondary | ICD-10-CM | POA: Diagnosis not present

## 2019-04-23 DIAGNOSIS — I10 Essential (primary) hypertension: Secondary | ICD-10-CM | POA: Diagnosis not present

## 2019-04-23 DIAGNOSIS — M810 Age-related osteoporosis without current pathological fracture: Secondary | ICD-10-CM | POA: Diagnosis not present

## 2019-04-23 DIAGNOSIS — Z170 Estrogen receptor positive status [ER+]: Secondary | ICD-10-CM | POA: Diagnosis not present

## 2019-04-23 MED ORDER — TRASTUZUMAB-ANNS CHEMO 150 MG IV SOLR
150.0000 mg | Freq: Once | INTRAVENOUS | Status: AC
  Administered 2019-04-23: 14:00:00 150 mg via INTRAVENOUS
  Filled 2019-04-23: qty 7.14

## 2019-04-23 MED ORDER — HEPARIN SOD (PORK) LOCK FLUSH 100 UNIT/ML IV SOLN
500.0000 [IU] | Freq: Once | INTRAVENOUS | Status: AC | PRN
  Administered 2019-04-23: 500 [IU]
  Filled 2019-04-23: qty 5

## 2019-04-23 MED ORDER — SODIUM CHLORIDE 0.9% FLUSH
10.0000 mL | INTRAVENOUS | Status: DC | PRN
  Administered 2019-04-23: 14:00:00 10 mL
  Filled 2019-04-23: qty 10

## 2019-04-23 MED ORDER — ACETAMINOPHEN 325 MG PO TABS
650.0000 mg | ORAL_TABLET | Freq: Once | ORAL | Status: AC
  Administered 2019-04-23: 650 mg via ORAL
  Filled 2019-04-23: qty 2

## 2019-04-23 MED ORDER — SODIUM CHLORIDE 0.9 % IV SOLN
Freq: Once | INTRAVENOUS | Status: AC
  Administered 2019-04-23: 14:00:00 via INTRAVENOUS
  Filled 2019-04-23: qty 250

## 2019-04-28 NOTE — Progress Notes (Signed)
[facility]  [address] [place], [place] [address] Phone: [phone]  Fax: [phone]   Clinic Day:  04/30/2019  Referring physician: Einar Pheasant, MD  Chief Complaint: [name] [name] is a 85 y.o. female with stage I Her2/neu + breast cancer who is seen for 2 month assessment on Kanjinti.  HPI: The patient was last seen in the medical oncology clinic on 03/05/2019. At that time, she was doing well. She voiced no concerns. Exam was stable. Hematocrit 36.4. Hemoglobin 12.1. Platelet counts were 218,000. WBC 7,900.  CA27.29 was 5.1. She received Kanjinti.  She continued tamoxifen.    Patient received Kanjinti weekly (03/12/2019 - 04/23/2019).  Bone density on 04/06/2019 revealed osteoporosis with a T-score of -2.7 in the left neck femur.   During the interim, she has felt good. She is tolerating the infusions well. She denies shortness of breath or chest pains. She does not need to lay on multiple pillows at night. She does not wake up at night short of breath. She has knee pain secondary to arthritis.  She feels pressure when she walks. She has slight numbness in her toes. She notes cataracts. She has regular eye exams.    Past Medical History:  Diagnosis Date  . Diverticulitis of sigmoid colon   . Endometriosis   . Family history of breast cancer   . Family history of ovarian cancer   . Family history of prostate cancer   . Family history of uterine cancer   . Fibrocystic breast disease   . Glaucoma   . H/O urinary incontinence   . History of chicken pox   . History of colon polyps 2014  . Hyperlipidemia   . Hypertension   . Ovarian cancer ([place]) 2002   Chemo tx's @ [facility] with Total Hysterectomy. Dr Amalia Hailey  . Personal history of chemotherapy    ovarian cancer  . Personal history of ovarian cancer   . Port-A-Cath in place   . Sinusitis   . Tuberculosis    positve skin test    Past Surgical History:  Procedure Laterality Date   . ABDOMINAL HYSTERECTOMY  2003   complete Dr Amalia Hailey  . ABSCESS DRAINAGE  2017   right buttock at [facility]  . APPENDECTOMY  2003  . BREAST EXCISIONAL BIOPSY Left 1993   excisional bx. negative results  . BREAST SURGERY Left 1982   papilloma removal  . COLONOSCOPY  2006, 2014   Dr Vira Agar   . COLONOSCOPY N/A 03/27/2016   Procedure: COLONOSCOPY;  Surgeon: Robert Bellow, MD;  Location: [facility] ENDOSCOPY;  Service: Endoscopy;  Laterality: N/A;  . COLOSTOMY REVERSAL  2014   at [facility] Dr Raynaldo Opitz  . NASAL SINUS SURGERY     For fungal infection  . NASAL SINUS SURGERY  8/05 8/06  . OOPHORECTOMY    . OSTOMY  05/22/2012   diverticulitis with abcess/ Dr Tamala Julian  . ovarian cancer     Exploratory lap  . PORTA CATH INSERTION N/A 05/26/2018   Procedure: PORTA CATH INSERTION;  Surgeon: Algernon Huxley, MD;  Location: [facility];  Service: Cardiovascular;  Laterality: N/A;  . TONSILLECTOMY AND ADENOIDECTOMY  1947    Family History  Problem Relation Age of Onset  . Hypertension Mother   . Coronary artery disease Mother   . Heart disease Mother   . Kidney disease Mother   . Diabetes Mother   . Cancer Mother        uterine  cancer dx 6s  . Diabetes Father   . Kidney failure Father   . Diabetes Brother   . Coronary artery disease Brother   . Cancer Brother        bladder cancer  . Stroke Sister   . Heart disease Sister        [facility]  . Cancer Maternal Aunt 49       ovarian cancer  . Breast cancer Cousin   . Cancer Brother        Prostate    Social History:  reports that she has never smoked. She has never used smokeless tobacco. She reports that she does not drink alcohol or use drugs.  Patient is a retired Engineer, site. Generally accompanied to appointments by friend, [name]. She lives in [place]. The patient is alone today.  Allergies: No Known Allergies  Current Medications: Current Outpatient Medications  Medication Sig Dispense Refill  . amLODipine  (NORVASC) 2.5 MG tablet TAKE 1 TABLET BY MOUTH EVERY DAY 30 tablet 2  . Calcium Carbonate-Vitamin D 600-400 MG-UNIT tablet Take 1 tablet by mouth daily.     Marland Kitchen lidocaine-prilocaine (EMLA) cream Apply 1 application topically as needed. (Patient not taking: Reported on 02/05/2019) 30 g 5  . Multiple Vitamins-Minerals (MULTIVITAMIN WITH MINERALS) tablet Take 1 tablet by mouth daily.    . prochlorperazine (COMPAZINE) 10 MG tablet Take 1 tablet (10 mg total) by mouth every 6 (six) hours as needed for nausea or vomiting. (Patient not taking: Reported on 11/20/2018) 30 tablet 3  . tamoxifen (NOLVADEX) 20 MG tablet Take 1 tablet (20 mg total) by mouth daily. 30 tablet 1   Current Facility-Administered Medications  Medication Dose Route Frequency Provider Last Rate Last Dose  . lidocaine-prilocaine (EMLA) cream   Topical PRN Lucky Cowboy, Erskine Squibb, MD       Facility-Administered Medications Ordered in Other Visits  Medication Dose Route Frequency Provider Last Rate Last Dose  . heparin lock flush 100 unit/mL  500 Units Intravenous Once Corcoran, Melissa C, MD      . sodium chloride 0.9 % 50 mL with famotidine (PEPCID) 20 mg infusion  20 mg Intravenous Once Charlaine Dalton R, MD      . sodium chloride flush (NS) 0.9 % injection 10 mL  10 mL Intravenous PRN Cammie Sickle, MD   10 mL at 06/24/18 0854  . sodium chloride flush (NS) 0.9 % injection 10 mL  10 mL Intravenous PRN Lequita Asal, MD   10 mL at 04/30/19 0935    Review of Systems  Constitutional: Negative.  Negative for chills, diaphoresis, fever, malaise/fatigue and weight loss (up 3 pounds).       Feels "good".  HENT: Negative.  Negative for congestion, ear discharge, nosebleeds, sinus pain, sore throat and tinnitus.   Eyes: Negative for blurred vision, double vision, photophobia and pain.       Cataracts.  Respiratory: Negative.  Negative for cough, hemoptysis, sputum production, shortness of breath and wheezing.   Cardiovascular:  Negative.  Negative for chest pain, palpitations, leg swelling and PND.  Gastrointestinal: Negative.  Negative for abdominal pain, blood in stool, constipation, diarrhea, melena, nausea and vomiting.  Genitourinary: Negative.  Negative for dysuria, frequency, hematuria and urgency.  Musculoskeletal: Positive for joint pain (arthritis, knee). Negative for back pain, falls, myalgias and neck pain.  Skin: Negative.  Negative for itching and rash.  Neurological: Positive for sensory change (slight numbness in toes). Negative for dizziness, tingling, tremors, speech  change, focal weakness, weakness and headaches.  Endo/Heme/Allergies: Negative.  Does not bruise/bleed easily.  Psychiatric/Behavioral: Negative.  Negative for depression and memory loss. The patient is not nervous/anxious and does not have insomnia.   All other systems reviewed and are negative.  Performance status (ECOG): 1  Vitals Blood pressure 122/72, pulse 67, temperature 97.8 F (36.6 C), temperature source Tympanic, resp. rate 17, height '5\' 3"'$  (1.6 m), weight 169 lb 12.1 oz (77 kg), SpO2 98 %.   Physical Exam  Constitutional: She is oriented to person, place, and time. She appears well-developed and well-nourished. No distress.  HENT:  Head: Normocephalic and atraumatic.  Mouth/Throat: Oropharynx is clear and moist. No oropharyngeal exudate.  Curly gray hair. Upper dentures.  Mask.  Eyes: Pupils are equal, round, and reactive to light. Conjunctivae and EOM are normal. No scleral icterus.  Glasses.  Brown eyes.  Neck: Normal range of motion. Neck supple. No JVD present.  Cardiovascular: Normal rate, regular rhythm and normal heart sounds. Exam reveals no gallop and no friction rub.  No murmur heard. Pulmonary/Chest: Effort normal and breath sounds normal. No respiratory distress. She has no wheezes. She has no rales. She exhibits edema (right breast). She exhibits no tenderness. Right breast exhibits tenderness. Right breast  exhibits no skin change (scarring in upper quadrant s/p radiation). Left breast exhibits tenderness. Left breast exhibits no skin change (fibrocystic changes inferiorly).  Abdominal: Soft. Bowel sounds are normal. She exhibits no distension and no mass. There is no abdominal tenderness. There is no rebound and no guarding.  Musculoskeletal: Normal range of motion.        General: No tenderness or edema.  Lymphadenopathy:       Head (right side): No preauricular, no posterior auricular and no occipital adenopathy present.       Head (left side): No preauricular, no posterior auricular and no occipital adenopathy present.    She has no cervical adenopathy.    She has no axillary adenopathy.       Right: No inguinal and no supraclavicular adenopathy present.       Left: No inguinal and no supraclavicular adenopathy present.  Neurological: She is alert and oriented to person, place, and time.  Skin: Skin is warm and dry. No rash noted. She is not diaphoretic. No erythema. No pallor.  Psychiatric: She has a normal mood and affect. Her behavior is normal. Judgment and thought content normal.  Nursing note and vitals reviewed.   Infusion on 04/30/2019  Component Date Value Ref Range Status  . Sodium 04/30/2019 137  135 - 145 mmol/L Final  . Potassium 04/30/2019 3.9  3.5 - 5.1 mmol/L Final  . Chloride 04/30/2019 105  98 - 111 mmol/L Final  . CO2 04/30/2019 26  22 - 32 mmol/L Final  . Glucose, Bld 04/30/2019 125* 70 - 99 mg/dL Final  . BUN 04/30/2019 19  8 - 23 mg/dL Final  . Creatinine, Ser 04/30/2019 0.63  0.44 - 1.00 mg/dL Final  . Calcium 04/30/2019 8.4* 8.9 - 10.3 mg/dL Final  . Total Protein 04/30/2019 5.9* 6.5 - 8.1 g/dL Final  . Albumin 04/30/2019 3.3* 3.5 - 5.0 g/dL Final  . AST 04/30/2019 25  15 - 41 U/L Final  . ALT 04/30/2019 19  0 - 44 U/L Final  . Alkaline Phosphatase 04/30/2019 43  38 - 126 U/L Final  . Total Bilirubin 04/30/2019 0.2* 0.3 - 1.2 mg/dL Final  . GFR calc non Af  Amer 04/30/2019 >60  >60  mL/min Final  . GFR calc Af Amer 04/30/2019 >60  >60 mL/min Final  . Anion gap 04/30/2019 6  5 - 15 Final   Performed at [facility], [address] [place], [place] [address]  . WBC 04/30/2019 8.5  4.0 - 10.5 K/uL Final  . RBC 04/30/2019 3.73* 3.87 - 5.11 MIL/uL Final  . Hemoglobin 04/30/2019 11.8* 12.0 - 15.0 g/dL Final  . HCT 04/30/2019 35.5* 36.0 - 46.0 % Final  . MCV 04/30/2019 95.2  80.0 - 100.0 fL Final  . MCH 04/30/2019 31.6  26.0 - 34.0 pg Final  . MCHC 04/30/2019 33.2  30.0 - 36.0 g/dL Final  . RDW 04/30/2019 13.3  11.5 - 15.5 % Final  . Platelets 04/30/2019 230  150 - 400 K/uL Final  . nRBC 04/30/2019 0.0  0.0 - 0.2 % Final  . Neutrophils Relative % 04/30/2019 65  % Final  . Neutro Abs 04/30/2019 5.6  1.7 - 7.7 K/uL Final  . Lymphocytes Relative 04/30/2019 22  % Final  . Lymphs Abs 04/30/2019 1.9  0.7 - 4.0 K/uL Final  . Monocytes Relative 04/30/2019 10  % Final  . Monocytes Absolute 04/30/2019 0.8  0.1 - 1.0 K/uL Final  . Eosinophils Relative 04/30/2019 2  % Final  . Eosinophils Absolute 04/30/2019 0.2  0.0 - 0.5 K/uL Final  . Basophils Relative 04/30/2019 1  % Final  . Basophils Absolute 04/30/2019 0.1  0.0 - 0.1 K/uL Final  . Immature Granulocytes 04/30/2019 0  % Final  . Abs Immature Granulocytes 04/30/2019 0.03  0.00 - 0.07 K/uL Final   Performed at [facility], [address] [place], [place] [address]    Assessment:  [name] [name] is a 85 y.o. female with stage I Her2/neu + breast cancer s/p lumpectomy and sentinel lymph node biopsy on 03/18/2018. Pathologyrevealed a 0.6 cm grade III invasive mammary carcinoma. There was DCIS. There was no lymphovascular invasion. Tumor was ER + (99%), PR + (25%), and Her2/neu + by FISH. Pathologic stagewas T1bN0.  Screening mammogram on 01/17/2018 revealed calcifications and possible mass in the right breast. Diagnostic mammogram and ultrasoundon 01/22/2018 revealed  indeterminate group of microcalcifications over the right upper outer quadrant spanning 4 x 9 x 11 mm. There was a 4 x 7 x 7 mm probable complicated cyst at the 11 o'clock position of the right breast 5 cm from the nipple.  Bilateral diagnostic mammogram at [facility] on 02/06/2019 revealed no evidence of malignancy.   Shereceived12 weeks ofHerceptin and Taxol (05/20/2018 - 08/05/2018).She has continued weekly Herceptin(09/05/2018; 09/26/2018 - 01/08/2019).Herceptin was held on06/09/2018 and restarted on 12/18/2018.Herceptin was switched to [place] 01/15/2019 (last 04/23/2019).  Shereceivedwhole breastradiationfrom04/11/2018 - 11/05/2018.Tamoxifenbegan 11/20/2018.  CA27.29 has been followed: 10.7 on 10/03/2018, < 3.5 on 10/16/2018, 8.3 on 12/11/2018, < 3.5 on 02/05/2019, and 5.1 on 03/05/2019.  Echoat [facility] on 03/12/2018 revealed an EF of 61%. Echo at [facility] on03/02/2019 revealed an LVEF of 50 to 55%. There was a trivial pericardial effusion waspresent.[place] 11/12/2018 revealed an EFof 45-50%. [place] 12/10/2018 revealed an EFof55-60%.  Echo on 02/11/2019 revealed an EF of 50-55%.  He has a history of stage IIB ovarian cancer s/p exploratory laparotomy, TAH/BSO, omentectomy, peritoneal biopsies, selective pelvic and periaortic lymph node sampling on 06/20/2001. Pathologyrevealed a grade 1 papillary serous carcinoma of the ovary. Uterine serosa and the surface of the contralateral ovary were positive for metastatic disease. The primary tumor was approximately 15 cm in diameter. She received 6 cycles ofcarboplatin  and Taxol.  CA125 was 12.4 on 04/30/2019.  Bone densityon 04/01/2017 revealed osteoporosiswith a T-score of -2.5 in the left femoral neck.  Bone density on 04/06/2019 revealed osteoporosis with a T-score of -2.7 in the left neck femur.  She began Prolia on 01/26/2019.  She has grade I neuropathyin her feet. She has chronic right lower extremity  edema. Right lower extremity duplexon 07/22/2018 revealed no femoropopliteal and no calf DVT in the visualized calf veins.  Symptomatically, she is doing well.  Exam reveals post-operative and post radiation changes.  Plan: 1.   Labs today: CBC with diff, CMP, CA125.  2.Stage I HER-2/neu (+) breast cancer Clinically, she continues to do well. Radiation completed on05/27/2020. She began tamoxifenon06/04/2019. She continues Kanjinti (Herceptin biosimilar) for 1 year (52 weeks) of adjuvant therapy.  She has received 40 of 52 weeks to date. Kanjinti (Herceptin biosimilar) today and weekly x 7. 3.Chemotherapy-induced neuropathy Neuropathy remains mild. Continue to monitor. 4.Osteoporosis Bone density on 04/06/2019 revealed osteoporosis with a T-score of -2.7 in the left neck femur. Continue calcium and vitamin D. She receives Prolia  Every 6 months with Dr Nicki Reaper (last 01/26/2019). Continue to monitor. 5.Decline in ejection fraction (EF) Echo on 08/18/2018 revealed anEFof50-55%. Echo on 11/12/2018 revealed anEFof 45-50%. Echo on07/01/2020revealed an EF of55-60%.             Echo on 02/11/2019 revealed an EF of 50-55%. Echo scheduled on 05/13/2019.             Patient denies any symptoms of CHF.  Continue to monitor. 6.RTC in 8 weeks for MD assessment, labs (CBC with diff, CMP, CA 27-29) review of interval echo and Kanjinti.  I discussed the assessment and treatment plan with the patient.  The patient was provided an opportunity to ask questions and all were answered.  The patient agreed with the plan and demonstrated an understanding of the instructions.  The patient was advised to call back if the symptoms worsen or if the condition fails to improve as anticipated.   Lequita Asal, MD, PhD    04/30/2019, 10:39 AM  I, Selena Batten, am acting as scribe for [employer]. Mike Gip, MD, PhD.  I, Melissa  C. Mike Gip, MD, have reviewed the above documentation for accuracy and completeness, and I agree with the above.

## 2019-04-30 ENCOUNTER — Encounter: Payer: Self-pay | Admitting: Hematology and Oncology

## 2019-04-30 ENCOUNTER — Inpatient Hospital Stay: Payer: Medicare HMO

## 2019-04-30 ENCOUNTER — Telehealth: Payer: Self-pay | Admitting: Hematology and Oncology

## 2019-04-30 ENCOUNTER — Inpatient Hospital Stay (HOSPITAL_BASED_OUTPATIENT_CLINIC_OR_DEPARTMENT_OTHER): Payer: Medicare HMO | Admitting: Hematology and Oncology

## 2019-04-30 ENCOUNTER — Other Ambulatory Visit: Payer: Self-pay

## 2019-04-30 VITALS — BP 130/80 | HR 67 | Temp 96.6°F | Resp 18

## 2019-04-30 VITALS — BP 122/72 | HR 67 | Temp 97.8°F | Resp 17 | Ht 63.0 in | Wt 169.8 lb

## 2019-04-30 DIAGNOSIS — C50411 Malignant neoplasm of upper-outer quadrant of right female breast: Secondary | ICD-10-CM

## 2019-04-30 DIAGNOSIS — Z170 Estrogen receptor positive status [ER+]: Secondary | ICD-10-CM | POA: Diagnosis not present

## 2019-04-30 DIAGNOSIS — Z5112 Encounter for antineoplastic immunotherapy: Secondary | ICD-10-CM

## 2019-04-30 DIAGNOSIS — M810 Age-related osteoporosis without current pathological fracture: Secondary | ICD-10-CM

## 2019-04-30 DIAGNOSIS — Z8543 Personal history of malignant neoplasm of ovary: Secondary | ICD-10-CM | POA: Diagnosis not present

## 2019-04-30 DIAGNOSIS — T451X5A Adverse effect of antineoplastic and immunosuppressive drugs, initial encounter: Secondary | ICD-10-CM

## 2019-04-30 DIAGNOSIS — Z79810 Long term (current) use of selective estrogen receptor modulators (SERMs): Secondary | ICD-10-CM | POA: Diagnosis not present

## 2019-04-30 DIAGNOSIS — I10 Essential (primary) hypertension: Secondary | ICD-10-CM | POA: Diagnosis not present

## 2019-04-30 DIAGNOSIS — Z5111 Encounter for antineoplastic chemotherapy: Secondary | ICD-10-CM | POA: Diagnosis not present

## 2019-04-30 DIAGNOSIS — M1710 Unilateral primary osteoarthritis, unspecified knee: Secondary | ICD-10-CM | POA: Diagnosis not present

## 2019-04-30 DIAGNOSIS — Z79899 Other long term (current) drug therapy: Secondary | ICD-10-CM | POA: Diagnosis not present

## 2019-04-30 DIAGNOSIS — G620 Drug-induced polyneuropathy: Secondary | ICD-10-CM

## 2019-04-30 DIAGNOSIS — E785 Hyperlipidemia, unspecified: Secondary | ICD-10-CM | POA: Diagnosis not present

## 2019-04-30 LAB — COMPREHENSIVE METABOLIC PANEL
ALT: 19 U/L (ref 0–44)
AST: 25 U/L (ref 15–41)
Albumin: 3.3 g/dL — ABNORMAL LOW (ref 3.5–5.0)
Alkaline Phosphatase: 43 U/L (ref 38–126)
Anion gap: 6 (ref 5–15)
BUN: 19 mg/dL (ref 8–23)
CO2: 26 mmol/L (ref 22–32)
Calcium: 8.4 mg/dL — ABNORMAL LOW (ref 8.9–10.3)
Chloride: 105 mmol/L (ref 98–111)
Creatinine, Ser: 0.63 mg/dL (ref 0.44–1.00)
GFR calc Af Amer: >60 mL/min (ref >60)
GFR calc non Af Amer: >60 mL/min (ref >60)
Glucose, Bld: 125 mg/dL — ABNORMAL HIGH (ref 70–99)
Potassium: 3.9 mmol/L (ref 3.5–5.1)
Sodium: 137 mmol/L (ref 135–145)
Total Bilirubin: 0.2 mg/dL — ABNORMAL LOW (ref 0.3–1.2)
Total Protein: 5.9 g/dL — ABNORMAL LOW (ref 6.5–8.1)

## 2019-04-30 LAB — CBC WITH DIFFERENTIAL/PLATELET
Abs Immature Granulocytes: 0.03 10*3/uL (ref 0.00–0.07)
Basophils Absolute: 0.1 10*3/uL (ref 0.0–0.1)
Basophils Relative: 1 %
Eosinophils Absolute: 0.2 10*3/uL (ref 0.0–0.5)
Eosinophils Relative: 2 %
HCT: 35.5 % — ABNORMAL LOW (ref 36.0–46.0)
Hemoglobin: 11.8 g/dL — ABNORMAL LOW (ref 12.0–15.0)
Immature Granulocytes: 0 %
Lymphocytes Relative: 22 %
Lymphs Abs: 1.9 10*3/uL (ref 0.7–4.0)
MCH: 31.6 pg (ref 26.0–34.0)
MCHC: 33.2 g/dL (ref 30.0–36.0)
MCV: 95.2 fL (ref 80.0–100.0)
Monocytes Absolute: 0.8 10*3/uL (ref 0.1–1.0)
Monocytes Relative: 10 %
Neutro Abs: 5.6 10*3/uL (ref 1.7–7.7)
Neutrophils Relative %: 65 %
Platelets: 230 10*3/uL (ref 150–400)
RBC: 3.73 MIL/uL — ABNORMAL LOW (ref 3.87–5.11)
RDW: 13.3 % (ref 11.5–15.5)
WBC: 8.5 10*3/uL (ref 4.0–10.5)
nRBC: 0 % (ref 0.0–0.2)

## 2019-04-30 MED ORDER — TRASTUZUMAB-ANNS CHEMO 150 MG IV SOLR
150.0000 mg | Freq: Once | INTRAVENOUS | Status: AC
  Administered 2019-04-30: 150 mg via INTRAVENOUS
  Filled 2019-04-30: qty 7.14

## 2019-04-30 MED ORDER — SODIUM CHLORIDE 0.9% FLUSH
10.0000 mL | INTRAVENOUS | Status: DC | PRN
  Administered 2019-04-30: 10 mL via INTRAVENOUS
  Filled 2019-04-30: qty 10

## 2019-04-30 MED ORDER — ACETAMINOPHEN 325 MG PO TABS
650.0000 mg | ORAL_TABLET | Freq: Once | ORAL | Status: AC
  Administered 2019-04-30: 11:00:00 650 mg via ORAL

## 2019-04-30 MED ORDER — SODIUM CHLORIDE 0.9 % IV SOLN
Freq: Once | INTRAVENOUS | Status: AC
  Administered 2019-04-30: 11:00:00 via INTRAVENOUS
  Filled 2019-04-30: qty 250

## 2019-04-30 MED ORDER — HEPARIN SOD (PORK) LOCK FLUSH 100 UNIT/ML IV SOLN
500.0000 [IU] | Freq: Once | INTRAVENOUS | Status: AC
  Administered 2019-04-30: 500 [IU] via INTRAVENOUS

## 2019-04-30 NOTE — Progress Notes (Signed)
The patient c/o pain ( Arthritis)  to bilateral knees but nothing new today

## 2019-05-01 LAB — CA 125: Cancer Antigen (CA) 125: 12.4 U/mL (ref 0.0–38.1)

## 2019-05-05 ENCOUNTER — Other Ambulatory Visit: Payer: Self-pay

## 2019-05-06 ENCOUNTER — Other Ambulatory Visit: Payer: Self-pay | Admitting: Hematology and Oncology

## 2019-05-06 ENCOUNTER — Inpatient Hospital Stay: Payer: Medicare HMO

## 2019-05-06 VITALS — BP 160/70 | HR 68 | Temp 98.0°F | Resp 16

## 2019-05-06 DIAGNOSIS — M810 Age-related osteoporosis without current pathological fracture: Secondary | ICD-10-CM | POA: Diagnosis not present

## 2019-05-06 DIAGNOSIS — Z79810 Long term (current) use of selective estrogen receptor modulators (SERMs): Secondary | ICD-10-CM | POA: Diagnosis not present

## 2019-05-06 DIAGNOSIS — I10 Essential (primary) hypertension: Secondary | ICD-10-CM | POA: Diagnosis not present

## 2019-05-06 DIAGNOSIS — E785 Hyperlipidemia, unspecified: Secondary | ICD-10-CM | POA: Diagnosis not present

## 2019-05-06 DIAGNOSIS — C50411 Malignant neoplasm of upper-outer quadrant of right female breast: Secondary | ICD-10-CM

## 2019-05-06 DIAGNOSIS — Z170 Estrogen receptor positive status [ER+]: Secondary | ICD-10-CM | POA: Diagnosis not present

## 2019-05-06 DIAGNOSIS — Z8543 Personal history of malignant neoplasm of ovary: Secondary | ICD-10-CM | POA: Diagnosis not present

## 2019-05-06 DIAGNOSIS — Z79899 Other long term (current) drug therapy: Secondary | ICD-10-CM | POA: Diagnosis not present

## 2019-05-06 DIAGNOSIS — M1710 Unilateral primary osteoarthritis, unspecified knee: Secondary | ICD-10-CM | POA: Diagnosis not present

## 2019-05-06 DIAGNOSIS — Z5111 Encounter for antineoplastic chemotherapy: Secondary | ICD-10-CM | POA: Diagnosis not present

## 2019-05-06 MED ORDER — HEPARIN SOD (PORK) LOCK FLUSH 100 UNIT/ML IV SOLN
500.0000 [IU] | Freq: Once | INTRAVENOUS | Status: AC | PRN
  Administered 2019-05-06: 500 [IU]
  Filled 2019-05-06: qty 5

## 2019-05-06 MED ORDER — SODIUM CHLORIDE 0.9 % IV SOLN
Freq: Once | INTRAVENOUS | Status: AC
  Administered 2019-05-06: 14:00:00 via INTRAVENOUS
  Filled 2019-05-06: qty 250

## 2019-05-06 MED ORDER — ACETAMINOPHEN 325 MG PO TABS
650.0000 mg | ORAL_TABLET | Freq: Once | ORAL | Status: AC
  Administered 2019-05-06: 650 mg via ORAL
  Filled 2019-05-06: qty 2

## 2019-05-06 MED ORDER — TRASTUZUMAB-ANNS CHEMO 150 MG IV SOLR
150.0000 mg | Freq: Once | INTRAVENOUS | Status: AC
  Administered 2019-05-06: 150 mg via INTRAVENOUS
  Filled 2019-05-06: qty 7.14

## 2019-05-06 MED ORDER — SODIUM CHLORIDE 0.9% FLUSH
10.0000 mL | INTRAVENOUS | Status: DC | PRN
  Administered 2019-05-06: 10 mL
  Filled 2019-05-06: qty 10

## 2019-05-06 NOTE — Patient Instructions (Signed)
Trastuzumab injection for infusion What is this medicine? TRASTUZUMAB (tras TOO zoo mab) is a monoclonal antibody. It is used to treat breast cancer and stomach cancer. This medicine may be used for other purposes; ask your health care provider or pharmacist if you have questions. COMMON BRAND NAME(S): Herceptin, Herzuma, KANJINTI, Ogivri, Ontruzant, Trazimera What should I tell my health care provider before I take this medicine? They need to know if you have any of these conditions:  heart disease  heart failure  lung or breathing disease, like asthma  an unusual or allergic reaction to trastuzumab, benzyl alcohol, or other medications, foods, dyes, or preservatives  pregnant or trying to get pregnant  breast-feeding How should I use this medicine? This drug is given as an infusion into a vein. It is administered in a hospital or clinic by a specially trained health care professional. Talk to your pediatrician regarding the use of this medicine in children. This medicine is not approved for use in children. Overdosage: If you think you have taken too much of this medicine contact a poison control center or emergency room at once. NOTE: This medicine is only for you. Do not share this medicine with others. What if I miss a dose? It is important not to miss a dose. Call your doctor or health care professional if you are unable to keep an appointment. What may interact with this medicine? This medicine may interact with the following medications:  certain types of chemotherapy, such as daunorubicin, doxorubicin, epirubicin, and idarubicin This list may not describe all possible interactions. Give your health care provider a list of all the medicines, herbs, non-prescription drugs, or dietary supplements you use. Also tell them if you smoke, drink alcohol, or use illegal drugs. Some items may interact with your medicine. What should I watch for while using this medicine? Visit your  doctor for checks on your progress. Report any side effects. Continue your course of treatment even though you feel ill unless your doctor tells you to stop. Call your doctor or health care professional for advice if you get a fever, chills or sore throat, or other symptoms of a cold or flu. Do not treat yourself. Try to avoid being around people who are sick. You may experience fever, chills and shaking during your first infusion. These effects are usually mild and can be treated with other medicines. Report any side effects during the infusion to your health care professional. Fever and chills usually do not happen with later infusions. Do not become pregnant while taking this medicine or for 7 months after stopping it. Women should inform their doctor if they wish to become pregnant or think they might be pregnant. Women of child-bearing potential will need to have a negative pregnancy test before starting this medicine. There is a potential for serious side effects to an unborn child. Talk to your health care professional or pharmacist for more information. Do not breast-feed an infant while taking this medicine or for 7 months after stopping it. Women must use effective birth control with this medicine. What side effects may I notice from receiving this medicine? Side effects that you should report to your doctor or health care professional as soon as possible:  allergic reactions like skin rash, itching or hives, swelling of the face, lips, or tongue  chest pain or palpitations  cough  dizziness  feeling faint or lightheaded, falls  fever  general ill feeling or flu-like symptoms  signs of worsening heart failure like   breathing problems; swelling in your legs and feet  unusually weak or tired Side effects that usually do not require medical attention (report to your doctor or health care professional if they continue or are bothersome):  bone pain  changes in  taste  diarrhea  joint pain  nausea/vomiting  weight loss This list may not describe all possible side effects. Call your doctor for medical advice about side effects. You may report side effects to FDA at 1-800-FDA-1088. Where should I keep my medicine? This drug is given in a hospital or clinic and will not be stored at home. NOTE: This sheet is a summary. It may not cover all possible information. If you have questions about this medicine, talk to your doctor, pharmacist, or health care provider.  2020 Elsevier/Gold Standard (2016-05-22 14:37:52)  

## 2019-05-13 ENCOUNTER — Other Ambulatory Visit: Payer: Self-pay

## 2019-05-13 ENCOUNTER — Ambulatory Visit
Admission: RE | Admit: 2019-05-13 | Discharge: 2019-05-13 | Disposition: A | Discharge status: Home / Self Care | Payer: Medicare HMO | Source: Ambulatory Visit | Attending: Hematology and Oncology | Admitting: Hematology and Oncology

## 2019-05-13 DIAGNOSIS — R0602 Shortness of breath: Secondary | ICD-10-CM | POA: Diagnosis not present

## 2019-05-13 DIAGNOSIS — Z170 Estrogen receptor positive status [ER+]: Secondary | ICD-10-CM | POA: Diagnosis not present

## 2019-05-13 DIAGNOSIS — Z8543 Personal history of malignant neoplasm of ovary: Secondary | ICD-10-CM | POA: Insufficient documentation

## 2019-05-13 DIAGNOSIS — I10 Essential (primary) hypertension: Secondary | ICD-10-CM | POA: Diagnosis not present

## 2019-05-13 DIAGNOSIS — R931 Abnormal findings on diagnostic imaging of heart and coronary circulation: Secondary | ICD-10-CM | POA: Diagnosis not present

## 2019-05-13 DIAGNOSIS — I081 Rheumatic disorders of both mitral and tricuspid valves: Secondary | ICD-10-CM | POA: Diagnosis not present

## 2019-05-13 DIAGNOSIS — C50411 Malignant neoplasm of upper-outer quadrant of right female breast: Secondary | ICD-10-CM | POA: Diagnosis not present

## 2019-05-13 DIAGNOSIS — E785 Hyperlipidemia, unspecified: Secondary | ICD-10-CM | POA: Diagnosis not present

## 2019-05-13 NOTE — Progress Notes (Signed)
*  PRELIMINARY RESULTS* Echocardiogram 2D Echocardiogram has been performed.  [name] [name] 05/13/2019, 10:44 AM

## 2019-05-14 ENCOUNTER — Other Ambulatory Visit: Payer: Self-pay | Admitting: Hematology and Oncology

## 2019-05-14 ENCOUNTER — Inpatient Hospital Stay: Payer: Medicare HMO | Attending: Hematology and Oncology

## 2019-05-14 VITALS — BP 155/73 | HR 61 | Temp 97.3°F | Resp 18

## 2019-05-14 DIAGNOSIS — Z5112 Encounter for antineoplastic immunotherapy: Secondary | ICD-10-CM | POA: Diagnosis present

## 2019-05-14 DIAGNOSIS — Z170 Estrogen receptor positive status [ER+]: Secondary | ICD-10-CM

## 2019-05-14 DIAGNOSIS — C50411 Malignant neoplasm of upper-outer quadrant of right female breast: Secondary | ICD-10-CM | POA: Insufficient documentation

## 2019-05-14 MED ORDER — ACETAMINOPHEN 325 MG PO TABS
ORAL_TABLET | ORAL | Status: AC
  Filled 2019-05-14: qty 2

## 2019-05-14 MED ORDER — SODIUM CHLORIDE 0.9% FLUSH
10.0000 mL | INTRAVENOUS | Status: DC | PRN
  Administered 2019-05-14: 10 mL
  Filled 2019-05-14: qty 10

## 2019-05-14 MED ORDER — SODIUM CHLORIDE 0.9 % IV SOLN
Freq: Once | INTRAVENOUS | Status: AC
  Administered 2019-05-14: 14:00:00 via INTRAVENOUS
  Filled 2019-05-14: qty 250

## 2019-05-14 MED ORDER — HEPARIN SOD (PORK) LOCK FLUSH 100 UNIT/ML IV SOLN
500.0000 [IU] | Freq: Once | INTRAVENOUS | Status: AC | PRN
  Administered 2019-05-14: 500 [IU]

## 2019-05-14 MED ORDER — TRASTUZUMAB-ANNS CHEMO 150 MG IV SOLR
150.0000 mg | Freq: Once | INTRAVENOUS | Status: AC
  Administered 2019-05-14: 150 mg via INTRAVENOUS
  Filled 2019-05-14: qty 7.14

## 2019-05-14 MED ORDER — ACETAMINOPHEN 325 MG PO TABS
650.0000 mg | ORAL_TABLET | Freq: Once | ORAL | Status: AC
  Administered 2019-05-14: 650 mg via ORAL

## 2019-05-15 ENCOUNTER — Encounter: Payer: Self-pay | Admitting: Radiation Oncology

## 2019-05-15 ENCOUNTER — Ambulatory Visit
Admission: RE | Admit: 2019-05-15 | Discharge: 2019-05-15 | Disposition: A | Discharge status: Home / Self Care | Payer: Medicare HMO | Source: Ambulatory Visit | Attending: Radiation Oncology | Admitting: Radiation Oncology

## 2019-05-15 ENCOUNTER — Other Ambulatory Visit: Payer: Self-pay

## 2019-05-15 VITALS — BP 143/86 | HR 61 | Temp 97.0°F | Resp 16 | Wt 171.0 lb

## 2019-05-15 DIAGNOSIS — C50411 Malignant neoplasm of upper-outer quadrant of right female breast: Secondary | ICD-10-CM

## 2019-05-15 DIAGNOSIS — Z170 Estrogen receptor positive status [ER+]: Secondary | ICD-10-CM | POA: Insufficient documentation

## 2019-05-15 DIAGNOSIS — Z79810 Long term (current) use of selective estrogen receptor modulators (SERMs): Secondary | ICD-10-CM | POA: Diagnosis not present

## 2019-05-15 DIAGNOSIS — Z923 Personal history of irradiation: Secondary | ICD-10-CM | POA: Diagnosis not present

## 2019-05-15 NOTE — Progress Notes (Signed)
Radiation Oncology Follow up Note  Name: [name] [name]   Date:   05/15/2019 MRN:  [id number] DOB: 1933-06-20    This 85 y.o. female presents to the clinic today for 10-month follow-up status post whole breast radiation to her right breast for triple positive invasive mammary carcinoma.  REFERRING PROVIDER: Einar Pheasant, MD  HPI: Patient is an 85 year old female.  Now at 6 months having completed whole breast radiation to her right breast for triple positive invasive mammary carcinoma seen today she is without complaint she specifically denies breast tenderness cough or bone pain.  She is currently on tamoxifen tolerating that well without side effect.  She had repeat mammograms at [facility] about 2 months ago which were BI-RADS 2 benign.  She is currently on [place].  And tolerating that well under medical oncology's direction.Kanjinti.  Is a Herceptin bio similar.  COMPLICATIONS OF TREATMENT: none  FOLLOW UP COMPLIANCE: keeps appointments   PHYSICAL EXAM:  BP (!) 143/86 (BP Location: Left Arm, Patient Position: Sitting)   Pulse 61   Temp (!) 97 F (36.1 C) (Tympanic)   Resp 16   Wt 171 lb (77.6 kg)   BMI 30.29 kg/m  Lungs are clear to A&P cardiac examination essentially unremarkable with regular rate and rhythm. No dominant mass or nodularity is noted in either breast in 2 positions examined. Incision is well-healed. No axillary or supraclavicular adenopathy is appreciated. Cosmetic result is excellent.  Well-developed well-nourished patient in NAD. HEENT reveals PERLA, EOMI, discs not visualized.  Oral cavity is clear. No oral mucosal lesions are identified. Neck is clear without evidence of cervical or supraclavicular adenopathy. Lungs are clear to A&P. Cardiac examination is essentially unremarkable with regular rate and rhythm without murmur rub or thrill. Abdomen is benign with no organomegaly or masses noted. Motor sensory and DTR levels are equal and symmetric in the upper and lower  extremities. Cranial nerves II through XII are grossly intact. Proprioception is intact. No peripheral adenopathy or edema is identified. No motor or sensory levels are noted. Crude visual fields are within normal range.  RADIOLOGY RESULTS: Mammogram reports from [facility] reviewed compatible with above-stated findings  PLAN: At the present time patient is doing well 6 months out currently on bio similar Herceptin.  Patient desires follow-up care only with Dr. Mike Gip and at [facility].  I am going to discontinue follow-up care.  I would be happy to reevaluate patient anytime should further treatment be indicated.  I would like to take this opportunity to thank you for allowing me to participate in the care of your patient.Noreene Filbert, MD

## 2019-05-20 ENCOUNTER — Other Ambulatory Visit: Payer: Self-pay

## 2019-05-21 ENCOUNTER — Inpatient Hospital Stay: Payer: Medicare HMO

## 2019-05-21 VITALS — BP 129/70 | HR 60 | Temp 97.6°F | Resp 18 | Wt 172.0 lb

## 2019-05-21 DIAGNOSIS — Z5112 Encounter for antineoplastic immunotherapy: Secondary | ICD-10-CM | POA: Diagnosis not present

## 2019-05-21 DIAGNOSIS — C50411 Malignant neoplasm of upper-outer quadrant of right female breast: Secondary | ICD-10-CM | POA: Diagnosis not present

## 2019-05-21 DIAGNOSIS — Z170 Estrogen receptor positive status [ER+]: Secondary | ICD-10-CM

## 2019-05-21 MED ORDER — TRASTUZUMAB-ANNS CHEMO 150 MG IV SOLR
150.0000 mg | Freq: Once | INTRAVENOUS | Status: AC
  Administered 2019-05-21: 14:00:00 150 mg via INTRAVENOUS
  Filled 2019-05-21: qty 7.14

## 2019-05-21 MED ORDER — SODIUM CHLORIDE 0.9 % IV SOLN
Freq: Once | INTRAVENOUS | Status: AC
  Administered 2019-05-21: 14:00:00 via INTRAVENOUS
  Filled 2019-05-21: qty 250

## 2019-05-21 MED ORDER — HEPARIN SOD (PORK) LOCK FLUSH 100 UNIT/ML IV SOLN
500.0000 [IU] | Freq: Once | INTRAVENOUS | Status: AC | PRN
  Administered 2019-05-21: 500 [IU]
  Filled 2019-05-21: qty 5

## 2019-05-21 MED ORDER — ACETAMINOPHEN 325 MG PO TABS
650.0000 mg | ORAL_TABLET | Freq: Once | ORAL | Status: AC
  Administered 2019-05-21: 14:00:00 650 mg via ORAL
  Filled 2019-05-21: qty 2

## 2019-05-21 MED ORDER — SODIUM CHLORIDE 0.9% FLUSH
10.0000 mL | INTRAVENOUS | Status: DC | PRN
  Administered 2019-05-21: 10 mL
  Filled 2019-05-21: qty 10

## 2019-05-27 ENCOUNTER — Other Ambulatory Visit: Payer: Self-pay

## 2019-05-28 ENCOUNTER — Inpatient Hospital Stay: Payer: Medicare HMO

## 2019-05-28 ENCOUNTER — Other Ambulatory Visit: Payer: Self-pay | Admitting: Hematology and Oncology

## 2019-05-28 NOTE — Progress Notes (Signed)
Patient did not get treatment today. Per nursing pt was exposed to covid through family member. Message sent to MD regarding need to reload next week.

## 2019-05-29 ENCOUNTER — Ambulatory Visit: Payer: Medicare HMO | Attending: Internal Medicine

## 2019-05-29 DIAGNOSIS — Z20822 Contact with and (suspected) exposure to covid-19: Secondary | ICD-10-CM

## 2019-05-29 DIAGNOSIS — Z20828 Contact with and (suspected) exposure to other viral communicable diseases: Secondary | ICD-10-CM | POA: Diagnosis not present

## 2019-05-30 ENCOUNTER — Other Ambulatory Visit: Payer: Self-pay | Admitting: Hematology and Oncology

## 2019-05-30 DIAGNOSIS — C50411 Malignant neoplasm of upper-outer quadrant of right female breast: Secondary | ICD-10-CM

## 2019-05-30 LAB — NOVEL CORONAVIRUS, NAA: SARS-CoV-2, NAA: NOT DETECTED

## 2019-06-03 ENCOUNTER — Other Ambulatory Visit: Payer: Self-pay

## 2019-06-04 ENCOUNTER — Inpatient Hospital Stay: Payer: Medicare HMO

## 2019-06-04 VITALS — BP 134/73 | HR 67 | Temp 97.3°F | Resp 17

## 2019-06-04 DIAGNOSIS — C50411 Malignant neoplasm of upper-outer quadrant of right female breast: Secondary | ICD-10-CM

## 2019-06-04 DIAGNOSIS — Z5112 Encounter for antineoplastic immunotherapy: Secondary | ICD-10-CM | POA: Diagnosis not present

## 2019-06-04 MED ORDER — ACETAMINOPHEN 325 MG PO TABS
650.0000 mg | ORAL_TABLET | Freq: Once | ORAL | Status: AC
  Administered 2019-06-04: 650 mg via ORAL

## 2019-06-04 MED ORDER — TRASTUZUMAB-ANNS CHEMO 150 MG IV SOLR
300.0000 mg | Freq: Once | INTRAVENOUS | Status: AC
  Administered 2019-06-04: 300 mg via INTRAVENOUS
  Filled 2019-06-04: qty 14.29

## 2019-06-04 MED ORDER — SODIUM CHLORIDE 0.9% FLUSH
10.0000 mL | INTRAVENOUS | Status: DC | PRN
  Administered 2019-06-04: 10:00:00 10 mL
  Filled 2019-06-04: qty 10

## 2019-06-04 MED ORDER — HEPARIN SOD (PORK) LOCK FLUSH 100 UNIT/ML IV SOLN
500.0000 [IU] | Freq: Once | INTRAVENOUS | Status: AC | PRN
  Administered 2019-06-04: 500 [IU]
  Filled 2019-06-04: qty 5

## 2019-06-04 MED ORDER — SODIUM CHLORIDE 0.9 % IV SOLN
Freq: Once | INTRAVENOUS | Status: AC
  Filled 2019-06-04: qty 250

## 2019-06-10 ENCOUNTER — Other Ambulatory Visit: Payer: Self-pay

## 2019-06-11 ENCOUNTER — Other Ambulatory Visit: Payer: Self-pay | Admitting: Hematology and Oncology

## 2019-06-11 ENCOUNTER — Inpatient Hospital Stay: Payer: Medicare HMO

## 2019-06-11 VITALS — BP 145/74 | HR 63 | Temp 98.4°F | Resp 16

## 2019-06-11 DIAGNOSIS — Z5112 Encounter for antineoplastic immunotherapy: Secondary | ICD-10-CM | POA: Diagnosis not present

## 2019-06-11 DIAGNOSIS — C50411 Malignant neoplasm of upper-outer quadrant of right female breast: Secondary | ICD-10-CM

## 2019-06-11 MED ORDER — SODIUM CHLORIDE 0.9 % IV SOLN
Freq: Once | INTRAVENOUS | Status: AC
  Filled 2019-06-11: qty 250

## 2019-06-11 MED ORDER — SODIUM CHLORIDE 0.9% FLUSH
10.0000 mL | INTRAVENOUS | Status: DC | PRN
  Administered 2019-06-11: 10 mL via INTRAVENOUS
  Filled 2019-06-11: qty 10

## 2019-06-11 MED ORDER — TRASTUZUMAB-ANNS CHEMO 150 MG IV SOLR
150.0000 mg | Freq: Once | INTRAVENOUS | Status: AC
  Administered 2019-06-11: 150 mg via INTRAVENOUS
  Filled 2019-06-11: qty 7.14

## 2019-06-11 MED ORDER — HEPARIN SOD (PORK) LOCK FLUSH 100 UNIT/ML IV SOLN
500.0000 [IU] | Freq: Once | INTRAVENOUS | Status: AC
  Administered 2019-06-11: 500 [IU] via INTRAVENOUS
  Filled 2019-06-11: qty 5

## 2019-06-11 MED ORDER — ACETAMINOPHEN 325 MG PO TABS
650.0000 mg | ORAL_TABLET | Freq: Once | ORAL | Status: AC
  Administered 2019-06-11: 650 mg via ORAL

## 2019-06-11 NOTE — Patient Instructions (Signed)
Trastuzumab injection for infusion What is this medicine? TRASTUZUMAB (tras TOO zoo mab) is a monoclonal antibody. It is used to treat breast cancer and stomach cancer. This medicine may be used for other purposes; ask your health care provider or pharmacist if you have questions. COMMON BRAND NAME(S): Herceptin, Herzuma, KANJINTI, Ogivri, Ontruzant, Trazimera What should I tell my health care provider before I take this medicine? They need to know if you have any of these conditions:  heart disease  heart failure  lung or breathing disease, like asthma  an unusual or allergic reaction to trastuzumab, benzyl alcohol, or other medications, foods, dyes, or preservatives  pregnant or trying to get pregnant  breast-feeding How should I use this medicine? This drug is given as an infusion into a vein. It is administered in a hospital or clinic by a specially trained health care professional. Talk to your pediatrician regarding the use of this medicine in children. This medicine is not approved for use in children. Overdosage: If you think you have taken too much of this medicine contact a poison control center or emergency room at once. NOTE: This medicine is only for you. Do not share this medicine with others. What if I miss a dose? It is important not to miss a dose. Call your doctor or health care professional if you are unable to keep an appointment. What may interact with this medicine? This medicine may interact with the following medications:  certain types of chemotherapy, such as daunorubicin, doxorubicin, epirubicin, and idarubicin This list may not describe all possible interactions. Give your health care provider a list of all the medicines, herbs, non-prescription drugs, or dietary supplements you use. Also tell them if you smoke, drink alcohol, or use illegal drugs. Some items may interact with your medicine. What should I watch for while using this medicine? Visit your  doctor for checks on your progress. Report any side effects. Continue your course of treatment even though you feel ill unless your doctor tells you to stop. Call your doctor or health care professional for advice if you get a fever, chills or sore throat, or other symptoms of a cold or flu. Do not treat yourself. Try to avoid being around people who are sick. You may experience fever, chills and shaking during your first infusion. These effects are usually mild and can be treated with other medicines. Report any side effects during the infusion to your health care professional. Fever and chills usually do not happen with later infusions. Do not become pregnant while taking this medicine or for 7 months after stopping it. Women should inform their doctor if they wish to become pregnant or think they might be pregnant. Women of child-bearing potential will need to have a negative pregnancy test before starting this medicine. There is a potential for serious side effects to an unborn child. Talk to your health care professional or pharmacist for more information. Do not breast-feed an infant while taking this medicine or for 7 months after stopping it. Women must use effective birth control with this medicine. What side effects may I notice from receiving this medicine? Side effects that you should report to your doctor or health care professional as soon as possible:  allergic reactions like skin rash, itching or hives, swelling of the face, lips, or tongue  chest pain or palpitations  cough  dizziness  feeling faint or lightheaded, falls  fever  general ill feeling or flu-like symptoms  signs of worsening heart failure like   breathing problems; swelling in your legs and feet  unusually weak or tired Side effects that usually do not require medical attention (report to your doctor or health care professional if they continue or are bothersome):  bone pain  changes in  taste  diarrhea  joint pain  nausea/vomiting  weight loss This list may not describe all possible side effects. Call your doctor for medical advice about side effects. You may report side effects to FDA at 1-800-FDA-1088. Where should I keep my medicine? This drug is given in a hospital or clinic and will not be stored at home. NOTE: This sheet is a summary. It may not cover all possible information. If you have questions about this medicine, talk to your doctor, pharmacist, or health care provider.  2020 Elsevier/Gold Standard (2016-05-22 14:37:52)  

## 2019-06-17 ENCOUNTER — Other Ambulatory Visit: Payer: Self-pay

## 2019-06-17 ENCOUNTER — Other Ambulatory Visit: Payer: Self-pay | Admitting: Hematology and Oncology

## 2019-06-18 ENCOUNTER — Inpatient Hospital Stay: Payer: Medicare HMO | Attending: Hematology and Oncology

## 2019-06-18 VITALS — BP 146/75 | HR 63 | Temp 97.0°F | Resp 18

## 2019-06-18 DIAGNOSIS — Z5112 Encounter for antineoplastic immunotherapy: Secondary | ICD-10-CM | POA: Insufficient documentation

## 2019-06-18 DIAGNOSIS — G620 Drug-induced polyneuropathy: Secondary | ICD-10-CM | POA: Diagnosis not present

## 2019-06-18 DIAGNOSIS — C50411 Malignant neoplasm of upper-outer quadrant of right female breast: Secondary | ICD-10-CM | POA: Insufficient documentation

## 2019-06-18 DIAGNOSIS — Z8543 Personal history of malignant neoplasm of ovary: Secondary | ICD-10-CM | POA: Diagnosis not present

## 2019-06-18 DIAGNOSIS — M810 Age-related osteoporosis without current pathological fracture: Secondary | ICD-10-CM | POA: Diagnosis not present

## 2019-06-18 DIAGNOSIS — Z170 Estrogen receptor positive status [ER+]: Secondary | ICD-10-CM | POA: Insufficient documentation

## 2019-06-18 MED ORDER — HEPARIN SOD (PORK) LOCK FLUSH 100 UNIT/ML IV SOLN
INTRAVENOUS | Status: AC
  Filled 2019-06-18: qty 5

## 2019-06-18 MED ORDER — ACETAMINOPHEN 325 MG PO TABS
ORAL_TABLET | ORAL | Status: AC
  Filled 2019-06-18: qty 2

## 2019-06-18 MED ORDER — SODIUM CHLORIDE 0.9 % IV SOLN
Freq: Once | INTRAVENOUS | Status: AC
  Filled 2019-06-18: qty 250

## 2019-06-18 MED ORDER — HEPARIN SOD (PORK) LOCK FLUSH 100 UNIT/ML IV SOLN
500.0000 [IU] | Freq: Once | INTRAVENOUS | Status: AC | PRN
  Administered 2019-06-18: 15:00:00 500 [IU]
  Filled 2019-06-18: qty 5

## 2019-06-18 MED ORDER — SODIUM CHLORIDE 0.9% FLUSH
10.0000 mL | INTRAVENOUS | Status: DC | PRN
  Administered 2019-06-18: 14:00:00 10 mL
  Filled 2019-06-18: qty 10

## 2019-06-18 MED ORDER — TRASTUZUMAB-ANNS CHEMO 150 MG IV SOLR
150.0000 mg | Freq: Once | INTRAVENOUS | Status: AC
  Administered 2019-06-18: 14:00:00 150 mg via INTRAVENOUS
  Filled 2019-06-18: qty 7.14

## 2019-06-18 MED ORDER — ACETAMINOPHEN 325 MG PO TABS
650.0000 mg | ORAL_TABLET | Freq: Once | ORAL | Status: AC
  Administered 2019-06-18: 14:00:00 650 mg via ORAL

## 2019-06-23 NOTE — Progress Notes (Signed)
[facility]  [address] [place], [place] [address] Phone: [phone]  Fax: [phone]   Clinic Day:  06/25/2019  Referring physician: Einar Pheasant, MD  Chief Complaint: [name] [name] is a 86 y.o. female with stage I Her2/neu + breast cancer who is seen for 2 month assessment on Kanjinti.   HPI: The patient was last seen in the medical oncology clinic on 04/30/2019. At that time, she was doing well.  Exam revealed post-operative and post radiation changes. Hematocrit 35.5, hemoglobin 11.8, platelets 230,000, WBC 8,500.   Patient received Kanjinti weekly x 7 (04/30/2019 - 06/18/2019).  Patient did not receive treatment on 06/04/2019 as she was exposed to COVID-19 through a family member.   Echo on 05/13/2019 revealed an EF of 50-55%.   She was seen by Dr. Baruch Gouty on 05/15/2019.  She was doing well. She had no breast tenderness. She denied any cough or bone pain. She was tolerating tamoxifen and Kanjinti.  No further follow-up was scheduled.   During the interim, she has felt "good". She eating well. She has arthritis in her bilateral knees (R>L). She notes it is painful to walk sometimes. She continues to have numbness in her toes. She is taking her tamoxifen.    Past Medical History:  Diagnosis Date  . Diverticulitis of sigmoid colon   . Endometriosis   . Family history of breast cancer   . Family history of ovarian cancer   . Family history of prostate cancer   . Family history of uterine cancer   . Fibrocystic breast disease   . Glaucoma   . H/O urinary incontinence   . History of chicken pox   . History of colon polyps 2014  . Hyperlipidemia   . Hypertension   . Ovarian cancer ([place]) 2002   Chemo tx's @ [facility] with Total Hysterectomy. Dr Amalia Hailey  . Personal history of chemotherapy    ovarian cancer  . Personal history of ovarian cancer   . Port-A-Cath in place   . Sinusitis   . Tuberculosis    positve skin test    Past  Surgical History:  Procedure Laterality Date  . ABDOMINAL HYSTERECTOMY  2003   complete Dr Amalia Hailey  . ABSCESS DRAINAGE  2017   right buttock at [facility]  . APPENDECTOMY  2003  . BREAST EXCISIONAL BIOPSY Left 1993   excisional bx. negative results  . BREAST SURGERY Left 1982   papilloma removal  . COLONOSCOPY  2006, 2014   Dr Vira Agar   . COLONOSCOPY N/A 03/27/2016   Procedure: COLONOSCOPY;  Surgeon: Robert Bellow, MD;  Location: [facility] ENDOSCOPY;  Service: Endoscopy;  Laterality: N/A;  . COLOSTOMY REVERSAL  2014   at [facility] Dr Raynaldo Opitz  . NASAL SINUS SURGERY     For fungal infection  . NASAL SINUS SURGERY  8/05 8/06  . OOPHORECTOMY    . OSTOMY  05/22/2012   diverticulitis with abcess/ Dr Tamala Julian  . ovarian cancer     Exploratory lap  . PORTA CATH INSERTION N/A 05/26/2018   Procedure: PORTA CATH INSERTION;  Surgeon: Algernon Huxley, MD;  Location: [facility];  Service: Cardiovascular;  Laterality: N/A;  . TONSILLECTOMY AND ADENOIDECTOMY  1947    Family History  Problem Relation Age of Onset  . Hypertension Mother   . Coronary artery disease Mother   . Heart disease Mother   . Kidney disease Mother   . Diabetes Mother   . Cancer Mother  uterine cancer dx 21s  . Diabetes Father   . Kidney failure Father   . Diabetes Brother   . Coronary artery disease Brother   . Cancer Brother        bladder cancer  . Stroke Sister   . Heart disease Sister        [facility]  . Cancer Maternal Aunt 49       ovarian cancer  . Breast cancer Cousin   . Cancer Brother        Prostate    Social History:  reports that she has never smoked. She has never used smokeless tobacco. She reports that she does not drink alcohol or use drugs. Patient is a retired Engineer, site. Generally accompanied to appointments by friend, [name]. She lives in [place]. The patient is alone today.  Allergies: No Known Allergies  Current Medications: Current Outpatient Medications    Medication Sig Dispense Refill  . amLODipine (NORVASC) 2.5 MG tablet TAKE 1 TABLET BY MOUTH EVERY DAY 30 tablet 2  . Calcium Carbonate-Vitamin D 600-400 MG-UNIT tablet Take 1 tablet by mouth daily.     Marland Kitchen lidocaine-prilocaine (EMLA) cream Apply 1 application topically as needed. 30 g 5  . Multiple Vitamins-Minerals (MULTIVITAMIN WITH MINERALS) tablet Take 1 tablet by mouth daily.    . tamoxifen (NOLVADEX) 20 MG tablet TAKE 1 TABLET BY MOUTH EVERY DAY 30 tablet 1  . prochlorperazine (COMPAZINE) 10 MG tablet Take 1 tablet (10 mg total) by mouth every 6 (six) hours as needed for nausea or vomiting. (Patient not taking: Reported on 11/20/2018) 30 tablet 3   Current Facility-Administered Medications  Medication Dose Route Frequency Provider Last Rate Last Admin  . lidocaine-prilocaine (EMLA) cream   Topical PRN Lucky Cowboy, Erskine Squibb, MD       Facility-Administered Medications Ordered in Other Visits  Medication Dose Route Frequency Provider Last Rate Last Admin  . heparin lock flush 100 unit/mL  500 Units Intravenous Once Rogers Ditter C, MD      . sodium chloride 0.9 % 50 mL with famotidine (PEPCID) 20 mg infusion  20 mg Intravenous Once Charlaine Dalton R, MD      . sodium chloride flush (NS) 0.9 % injection 10 mL  10 mL Intravenous PRN Cammie Sickle, MD   10 mL at 06/24/18 0854  . sodium chloride flush (NS) 0.9 % injection 10 mL  10 mL Intravenous PRN Lequita Asal, MD   10 mL at 06/25/19 0956    Review of Systems  Constitutional: Negative.  Negative for chills, diaphoresis, fever, malaise/fatigue and weight loss (up 1 pound).       Feels "good".  HENT: Negative.  Negative for congestion, ear discharge, nosebleeds, sinus pain, sore throat and tinnitus.   Eyes: Negative for blurred vision, double vision and photophobia.       Cataracts.  Respiratory: Negative.  Negative for cough, hemoptysis, sputum production, shortness of breath and wheezing.   Cardiovascular: Negative.   Negative for chest pain, palpitations, leg swelling and PND.  Gastrointestinal: Negative.  Negative for abdominal pain, blood in stool, constipation, diarrhea, melena, nausea and vomiting.       Eating well.   Genitourinary: Negative.  Negative for dysuria, frequency, hematuria and urgency.  Musculoskeletal: Positive for joint pain (arthritis, bilateral knees (R>L)). Negative for back pain, falls, myalgias and neck pain.  Skin: Negative.  Negative for itching and rash.  Neurological: Positive for sensory change (slight numbness in toes). Negative for dizziness,  tingling, tremors, speech change, focal weakness, weakness and headaches.       Gait slow related to arthritis in bilateral knees.  Endo/Heme/Allergies: Negative.  Does not bruise/bleed easily.  Psychiatric/Behavioral: Negative.  Negative for depression and memory loss. The patient is not nervous/anxious and does not have insomnia.   All other systems reviewed and are negative.  Performance status (ECOG): 1  Vitals Blood pressure (!) 148/79, pulse 67, temperature (!) 96.7 F (35.9 C), temperature source Tympanic, resp. rate 18, weight 170 lb 8.4 oz (77.3 kg), SpO2 98 %.   Physical Exam  Constitutional: She is oriented to person, place, and time. She appears well-developed and well-nourished. No distress.  Patient needs assistance onto exam room table.  HENT:  Head: Normocephalic and atraumatic.  Mouth/Throat: Oropharynx is clear and moist. No oropharyngeal exudate.  Curly gray hair. Upper dentures.  Mask.  Eyes: Pupils are equal, round, and reactive to light. Conjunctivae and EOM are normal. No scleral icterus.  Glasses.  Brown eyes.  Neck: No JVD present.  Cardiovascular: Normal rate, regular rhythm and normal heart sounds. Exam reveals no gallop and no friction rub.  No murmur heard. Pulmonary/Chest: Effort normal and breath sounds normal. No respiratory distress. She has no wheezes. She has no rales.  Abdominal: Soft. Bowel  sounds are normal. She exhibits no distension and no mass. There is no abdominal tenderness. There is no rebound and no guarding.  Musculoskeletal:        General: No tenderness or edema. Normal range of motion.     Cervical back: Normal range of motion and neck supple.  Lymphadenopathy:       Head (right side): No preauricular, no posterior auricular and no occipital adenopathy present.       Head (left side): No preauricular, no posterior auricular and no occipital adenopathy present.    She has no cervical adenopathy.    She has no axillary adenopathy.       Right: No inguinal and no supraclavicular adenopathy present.       Left: No inguinal and no supraclavicular adenopathy present.  Neurological: She is alert and oriented to person, place, and time.  Skin: Skin is warm and dry. No rash noted. She is not diaphoretic. No erythema. No pallor.  Psychiatric: She has a normal mood and affect. Her behavior is normal. Judgment and thought content normal.  Nursing note and vitals reviewed.   Infusion on 06/25/2019  Component Date Value Ref Range Status  . Sodium 06/25/2019 138  135 - 145 mmol/L Final  . Potassium 06/25/2019 3.8  3.5 - 5.1 mmol/L Final  . Chloride 06/25/2019 107  98 - 111 mmol/L Final  . CO2 06/25/2019 24  22 - 32 mmol/L Final  . Glucose, Bld 06/25/2019 91  70 - 99 mg/dL Final  . BUN 06/25/2019 17  8 - 23 mg/dL Final  . Creatinine, Ser 06/25/2019 0.55  0.44 - 1.00 mg/dL Final  . Calcium 06/25/2019 8.6* 8.9 - 10.3 mg/dL Final  . Total Protein 06/25/2019 6.2* 6.5 - 8.1 g/dL Final  . Albumin 06/25/2019 3.2* 3.5 - 5.0 g/dL Final  . AST 06/25/2019 22  15 - 41 U/L Final  . ALT 06/25/2019 20  0 - 44 U/L Final  . Alkaline Phosphatase 06/25/2019 43  38 - 126 U/L Final  . Total Bilirubin 06/25/2019 0.2* 0.3 - 1.2 mg/dL Final  . GFR calc non Af Amer 06/25/2019 >60  >60 mL/min Final  . GFR calc Af Amer 06/25/2019 >  60  >60 mL/min Final  . Anion gap 06/25/2019 7  5 - 15 Final    Performed at [facility], [address] [place], [place] [address]  . WBC 06/25/2019 9.5  4.0 - 10.5 K/uL Final  . RBC 06/25/2019 3.83* 3.87 - 5.11 MIL/uL Final  . Hemoglobin 06/25/2019 12.0  12.0 - 15.0 g/dL Final  . HCT 06/25/2019 36.8  36.0 - 46.0 % Final  . MCV 06/25/2019 96.1  80.0 - 100.0 fL Final  . MCH 06/25/2019 31.3  26.0 - 34.0 pg Final  . MCHC 06/25/2019 32.6  30.0 - 36.0 g/dL Final  . RDW 06/25/2019 13.4  11.5 - 15.5 % Final  . Platelets 06/25/2019 233  150 - 400 K/uL Final  . nRBC 06/25/2019 0.0  0.0 - 0.2 % Final  . Neutrophils Relative % 06/25/2019 66  % Final  . Neutro Abs 06/25/2019 6.4  1.7 - 7.7 K/uL Final  . Lymphocytes Relative 06/25/2019 21  % Final  . Lymphs Abs 06/25/2019 2.0  0.7 - 4.0 K/uL Final  . Monocytes Relative 06/25/2019 11  % Final  . Monocytes Absolute 06/25/2019 1.0  0.1 - 1.0 K/uL Final  . Eosinophils Relative 06/25/2019 1  % Final  . Eosinophils Absolute 06/25/2019 0.1  0.0 - 0.5 K/uL Final  . Basophils Relative 06/25/2019 1  % Final  . Basophils Absolute 06/25/2019 0.1  0.0 - 0.1 K/uL Final  . Immature Granulocytes 06/25/2019 0  % Final  . Abs Immature Granulocytes 06/25/2019 0.03  0.00 - 0.07 K/uL Final   Performed at [facility], [address] [place], [place] [address]    Assessment:  [name] [name] is a 86 y.o. female  with stage I Her2/neu + breast cancer s/p lumpectomy and sentinel lymph node biopsy on 03/18/2018. Pathologyrevealed a 0.6 cm grade III invasive mammary carcinoma. There was DCIS. There was no lymphovascular invasion. Tumor was ER + (99%), PR + (25%), and Her2/neu + by FISH. Pathologic stagewas T1bN0.  Screening mammogram on 01/17/2018 revealed calcifications and possible mass in the right breast. Diagnostic mammogram and ultrasoundon 01/22/2018 revealed indeterminate group of microcalcifications over the right upper outer quadrant spanning 4 x 9 x 11 mm. There was a 4 x 7 x 7 mm  probable complicated cyst at the 11 o'clock position of the right breast 5 cm from the nipple.  [facility] revealedno evidence of malignancy.   Shereceived12 weeks ofHerceptin and Taxol (05/20/2018 - 08/05/2018).She has continued weekly Herceptin(09/05/2018; 09/26/2018 - 01/08/2019).Herceptin was held on06/09/2018 and restarted on 12/18/2018.Herceptin was switched to [place] 01/15/2019 (last 06/18/2019).  Shereceivedwhole breastradiationfrom04/11/2018 - 11/05/2018.Tamoxifenbegan 11/20/2018.  CA27.29 has been followed: 10.7 on 10/03/2018,< 3.5 on 10/16/2018, 8.3 on 12/11/2018, < 3.5 on 02/05/2019, and 5.1 on 03/05/2019.  Echoat [facility] on 03/12/2018 revealed an EF of 61%. [facility] on[facility]2019 revealed an LVEF of 50 to 55%. There was a trivial pericardial effusion waspresent.[place] 11/12/2018 revealed an EFof 45-50%. [place] 12/10/2018 revealed an EFof55-60%.[place] 02/11/2019 and 05/13/2019 revealed an EF of 50-55%.  He has a history of stage IIB ovarian cancer s/p exploratory laparotomy, TAH/BSO, omentectomy, peritoneal biopsies, selective pelvic and periaortic lymph node sampling on 06/20/2001. Pathologyrevealed a grade 1 papillary serous carcinoma of the ovary. Uterine serosa and the surface of the contralateral ovary were positive for metastatic disease. The primary tumor was approximately 15 cm in diameter. She received 6 cycles ofcarboplatin and Taxol.  CA125 was 12.4 on 04/30/2019.  Bone densityon  04/01/2017 revealed osteoporosiswith a T-score of -2.5 in the left femoral neck.Bone density on 04/06/2019 revealed osteoporosis with a T-score of -2.7 in the left neck femur.  She began Proliaon 01/26/2019.  She has grade I neuropathyin her feet. She has chronic right lower extremity edema. Right lower extremity duplexon 07/22/2018 revealed no femoropopliteal and no calf DVT in the visualized calf  veins.  Symptomatically, she is doing well.  She voices no complaints.  Plan: 1.   Labs today: CBC with diff, CMP, CA 27.29. 2.Stage I HER-2/neu (+) breast cancer Clinically,she is doing well. Radiation completed on05/27/2020. Shebegantamoxifenon06/04/2019. She continuesKanjinti (Herceptin biosimilar)for 1 year (52 weeks) of adjuvant therapy.             She has received 47 of 52 weeks to date. Kanjinti (Herceptin biosimilar) today and x 3 (01/21, 01/28, 02/04). 3.Chemotherapy-induced neuropathy Neuropathy is mild. No intervention needed. 4.Osteoporosis Bone density on 04/06/2019 revealed osteoporosis with a T-score of -2.7 in the left neck femur. Continue calcium and vitamin D. She receives Prolia every 6 months with Dr Nicki Reaper (last 01/26/2019). Prolia due on around 07/29/2019. 5.Decline in ejection fraction (EF) Echo on 02/11/2019 revealed an EF of 50-55%. Echo on 05/13/2019 revealed an EF of 50-55%. Patient denies any cardiac symptoms.  Continue to monitor. 6.RTC on 07/23/2019 for MD assessment including breast exam, labs (CBC with diff, CMP, CA 27.29) and last Kanjinti.  I discussed the assessment and treatment plan with the patient.  The patient was provided an opportunity to ask questions and all were answered.  The patient agreed with the plan and demonstrated an understanding of the instructions.  The patient was advised to call back if the symptoms worsen or if the condition fails to improve as anticipated.   Lequita Asal, MD, PhD    06/25/2019, 10:48 AM  I, Selena Batten, am acting as scribe for [employer]. Mike Gip, MD, PhD.  I, Tyniya Kuyper C. Mike Gip, MD, have reviewed the above documentation for accuracy and completeness, and I agree with the above.

## 2019-06-25 ENCOUNTER — Inpatient Hospital Stay: Payer: Medicare HMO

## 2019-06-25 ENCOUNTER — Encounter: Payer: Self-pay | Admitting: Hematology and Oncology

## 2019-06-25 ENCOUNTER — Other Ambulatory Visit: Payer: Self-pay | Admitting: Hematology and Oncology

## 2019-06-25 ENCOUNTER — Other Ambulatory Visit: Payer: Self-pay

## 2019-06-25 ENCOUNTER — Inpatient Hospital Stay (HOSPITAL_BASED_OUTPATIENT_CLINIC_OR_DEPARTMENT_OTHER): Payer: Medicare HMO | Admitting: Hematology and Oncology

## 2019-06-25 VITALS — BP 132/71 | HR 67 | Temp 97.1°F | Resp 16

## 2019-06-25 VITALS — BP 148/79 | HR 67 | Temp 96.7°F | Resp 18 | Wt 170.5 lb

## 2019-06-25 DIAGNOSIS — C50411 Malignant neoplasm of upper-outer quadrant of right female breast: Secondary | ICD-10-CM

## 2019-06-25 DIAGNOSIS — Z170 Estrogen receptor positive status [ER+]: Secondary | ICD-10-CM

## 2019-06-25 DIAGNOSIS — G620 Drug-induced polyneuropathy: Secondary | ICD-10-CM | POA: Diagnosis not present

## 2019-06-25 DIAGNOSIS — M810 Age-related osteoporosis without current pathological fracture: Secondary | ICD-10-CM | POA: Diagnosis not present

## 2019-06-25 DIAGNOSIS — Z5112 Encounter for antineoplastic immunotherapy: Secondary | ICD-10-CM | POA: Diagnosis not present

## 2019-06-25 DIAGNOSIS — T451X5A Adverse effect of antineoplastic and immunosuppressive drugs, initial encounter: Secondary | ICD-10-CM

## 2019-06-25 LAB — COMPREHENSIVE METABOLIC PANEL
ALT: 20 U/L (ref 0–44)
AST: 22 U/L (ref 15–41)
Albumin: 3.2 g/dL — ABNORMAL LOW (ref 3.5–5.0)
Alkaline Phosphatase: 43 U/L (ref 38–126)
Anion gap: 7 (ref 5–15)
BUN: 17 mg/dL (ref 8–23)
CO2: 24 mmol/L (ref 22–32)
Calcium: 8.6 mg/dL — ABNORMAL LOW (ref 8.9–10.3)
Chloride: 107 mmol/L (ref 98–111)
Creatinine, Ser: 0.55 mg/dL (ref 0.44–1.00)
GFR calc Af Amer: >60 mL/min (ref >60)
GFR calc non Af Amer: >60 mL/min (ref >60)
Glucose, Bld: 91 mg/dL (ref 70–99)
Potassium: 3.8 mmol/L (ref 3.5–5.1)
Sodium: 138 mmol/L (ref 135–145)
Total Bilirubin: 0.2 mg/dL — ABNORMAL LOW (ref 0.3–1.2)
Total Protein: 6.2 g/dL — ABNORMAL LOW (ref 6.5–8.1)

## 2019-06-25 LAB — CBC WITH DIFFERENTIAL/PLATELET
Abs Immature Granulocytes: 0.03 10*3/uL (ref 0.00–0.07)
Basophils Absolute: 0.1 10*3/uL (ref 0.0–0.1)
Basophils Relative: 1 %
Eosinophils Absolute: 0.1 10*3/uL (ref 0.0–0.5)
Eosinophils Relative: 1 %
HCT: 36.8 % (ref 36.0–46.0)
Hemoglobin: 12 g/dL (ref 12.0–15.0)
Immature Granulocytes: 0 %
Lymphocytes Relative: 21 %
Lymphs Abs: 2 10*3/uL (ref 0.7–4.0)
MCH: 31.3 pg (ref 26.0–34.0)
MCHC: 32.6 g/dL (ref 30.0–36.0)
MCV: 96.1 fL (ref 80.0–100.0)
Monocytes Absolute: 1 10*3/uL (ref 0.1–1.0)
Monocytes Relative: 11 %
Neutro Abs: 6.4 10*3/uL (ref 1.7–7.7)
Neutrophils Relative %: 66 %
Platelets: 233 10*3/uL (ref 150–400)
RBC: 3.83 MIL/uL — ABNORMAL LOW (ref 3.87–5.11)
RDW: 13.4 % (ref 11.5–15.5)
WBC: 9.5 10*3/uL (ref 4.0–10.5)
nRBC: 0 % (ref 0.0–0.2)

## 2019-06-25 MED ORDER — HEPARIN SOD (PORK) LOCK FLUSH 100 UNIT/ML IV SOLN
500.0000 [IU] | Freq: Once | INTRAVENOUS | Status: AC
  Administered 2019-06-25: 500 [IU] via INTRAVENOUS
  Filled 2019-06-25: qty 5

## 2019-06-25 MED ORDER — SODIUM CHLORIDE 0.9 % IV SOLN
Freq: Once | INTRAVENOUS | Status: AC
  Filled 2019-06-25: qty 250

## 2019-06-25 MED ORDER — TRASTUZUMAB-ANNS CHEMO 150 MG IV SOLR
150.0000 mg | Freq: Once | INTRAVENOUS | Status: AC
  Administered 2019-06-25: 150 mg via INTRAVENOUS
  Filled 2019-06-25: qty 7.14

## 2019-06-25 MED ORDER — ACETAMINOPHEN 325 MG PO TABS
650.0000 mg | ORAL_TABLET | Freq: Once | ORAL | Status: AC
  Administered 2019-06-25: 650 mg via ORAL

## 2019-06-25 MED ORDER — SODIUM CHLORIDE 0.9% FLUSH
10.0000 mL | INTRAVENOUS | Status: DC | PRN
  Administered 2019-06-25: 10 mL via INTRAVENOUS
  Filled 2019-06-25: qty 10

## 2019-06-25 NOTE — Progress Notes (Signed)
Patient here for follow up. Denies any concerns.  

## 2019-06-26 LAB — CANCER ANTIGEN 27.29: CA 27.29: 10.4 U/mL (ref 0.0–38.6)

## 2019-07-01 ENCOUNTER — Other Ambulatory Visit: Payer: Self-pay

## 2019-07-02 ENCOUNTER — Inpatient Hospital Stay: Payer: Medicare HMO

## 2019-07-02 ENCOUNTER — Other Ambulatory Visit: Payer: Self-pay | Admitting: Hematology and Oncology

## 2019-07-02 VITALS — BP 138/70 | HR 62 | Temp 97.0°F | Resp 20

## 2019-07-02 DIAGNOSIS — C50411 Malignant neoplasm of upper-outer quadrant of right female breast: Secondary | ICD-10-CM

## 2019-07-02 DIAGNOSIS — Z5112 Encounter for antineoplastic immunotherapy: Secondary | ICD-10-CM | POA: Diagnosis not present

## 2019-07-02 MED ORDER — ACETAMINOPHEN 325 MG PO TABS
650.0000 mg | ORAL_TABLET | Freq: Once | ORAL | Status: AC
  Administered 2019-07-02: 650 mg via ORAL
  Filled 2019-07-02: qty 2

## 2019-07-02 MED ORDER — HEPARIN SOD (PORK) LOCK FLUSH 100 UNIT/ML IV SOLN
500.0000 [IU] | Freq: Once | INTRAVENOUS | Status: AC | PRN
  Administered 2019-07-02: 500 [IU]
  Filled 2019-07-02: qty 5

## 2019-07-02 MED ORDER — SODIUM CHLORIDE 0.9% FLUSH
10.0000 mL | INTRAVENOUS | Status: DC | PRN
  Administered 2019-07-02: 10 mL
  Filled 2019-07-02: qty 10

## 2019-07-02 MED ORDER — SODIUM CHLORIDE 0.9 % IV SOLN
Freq: Once | INTRAVENOUS | Status: AC
  Filled 2019-07-02: qty 250

## 2019-07-02 MED ORDER — TRASTUZUMAB-ANNS CHEMO 150 MG IV SOLR
150.0000 mg | Freq: Once | INTRAVENOUS | Status: AC
  Administered 2019-07-02: 150 mg via INTRAVENOUS
  Filled 2019-07-02: qty 7.14

## 2019-07-09 ENCOUNTER — Other Ambulatory Visit: Payer: Self-pay

## 2019-07-09 ENCOUNTER — Inpatient Hospital Stay: Payer: Medicare HMO

## 2019-07-09 VITALS — BP 138/78 | HR 67 | Temp 98.1°F | Resp 18

## 2019-07-09 DIAGNOSIS — Z170 Estrogen receptor positive status [ER+]: Secondary | ICD-10-CM

## 2019-07-09 DIAGNOSIS — C50411 Malignant neoplasm of upper-outer quadrant of right female breast: Secondary | ICD-10-CM

## 2019-07-09 DIAGNOSIS — Z5112 Encounter for antineoplastic immunotherapy: Secondary | ICD-10-CM | POA: Diagnosis not present

## 2019-07-09 MED ORDER — SODIUM CHLORIDE 0.9 % IV SOLN
Freq: Once | INTRAVENOUS | Status: AC
  Filled 2019-07-09: qty 250

## 2019-07-09 MED ORDER — SODIUM CHLORIDE 0.9% FLUSH
10.0000 mL | INTRAVENOUS | Status: DC | PRN
  Administered 2019-07-09: 10 mL
  Filled 2019-07-09: qty 10

## 2019-07-09 MED ORDER — TRASTUZUMAB-ANNS CHEMO 150 MG IV SOLR
150.0000 mg | Freq: Once | INTRAVENOUS | Status: AC
  Administered 2019-07-09: 150 mg via INTRAVENOUS
  Filled 2019-07-09: qty 7.14

## 2019-07-09 MED ORDER — HEPARIN SOD (PORK) LOCK FLUSH 100 UNIT/ML IV SOLN
500.0000 [IU] | Freq: Once | INTRAVENOUS | Status: AC | PRN
  Administered 2019-07-09: 500 [IU]
  Filled 2019-07-09: qty 5

## 2019-07-09 MED ORDER — ACETAMINOPHEN 325 MG PO TABS
650.0000 mg | ORAL_TABLET | Freq: Once | ORAL | Status: AC
  Administered 2019-07-09: 650 mg via ORAL

## 2019-07-10 ENCOUNTER — Other Ambulatory Visit: Payer: Self-pay | Admitting: Internal Medicine

## 2019-07-15 ENCOUNTER — Other Ambulatory Visit: Payer: Self-pay

## 2019-07-16 ENCOUNTER — Inpatient Hospital Stay: Payer: Medicare HMO | Attending: Hematology and Oncology

## 2019-07-16 VITALS — BP 151/72 | HR 67 | Temp 97.2°F | Resp 18 | Wt 170.0 lb

## 2019-07-16 DIAGNOSIS — C50411 Malignant neoplasm of upper-outer quadrant of right female breast: Secondary | ICD-10-CM | POA: Diagnosis present

## 2019-07-16 DIAGNOSIS — Z5112 Encounter for antineoplastic immunotherapy: Secondary | ICD-10-CM | POA: Insufficient documentation

## 2019-07-16 DIAGNOSIS — Z170 Estrogen receptor positive status [ER+]: Secondary | ICD-10-CM

## 2019-07-16 MED ORDER — SODIUM CHLORIDE 0.9% FLUSH
10.0000 mL | INTRAVENOUS | Status: DC | PRN
  Administered 2019-07-16: 14:00:00 10 mL
  Filled 2019-07-16: qty 10

## 2019-07-16 MED ORDER — HEPARIN SOD (PORK) LOCK FLUSH 100 UNIT/ML IV SOLN
INTRAVENOUS | Status: AC
  Filled 2019-07-16: qty 5

## 2019-07-16 MED ORDER — SODIUM CHLORIDE 0.9 % IV SOLN
Freq: Once | INTRAVENOUS | Status: AC
  Filled 2019-07-16: qty 250

## 2019-07-16 MED ORDER — TRASTUZUMAB-ANNS CHEMO 150 MG IV SOLR
150.0000 mg | Freq: Once | INTRAVENOUS | Status: AC
  Administered 2019-07-16: 14:00:00 150 mg via INTRAVENOUS
  Filled 2019-07-16: qty 7.14

## 2019-07-16 MED ORDER — ACETAMINOPHEN 325 MG PO TABS
650.0000 mg | ORAL_TABLET | Freq: Once | ORAL | Status: AC
  Administered 2019-07-16: 650 mg via ORAL
  Filled 2019-07-16: qty 2

## 2019-07-16 MED ORDER — HEPARIN SOD (PORK) LOCK FLUSH 100 UNIT/ML IV SOLN
500.0000 [IU] | Freq: Once | INTRAVENOUS | Status: AC | PRN
  Administered 2019-07-16: 15:00:00 500 [IU]
  Filled 2019-07-16: qty 5

## 2019-07-20 NOTE — Progress Notes (Signed)
[facility]  [address], [address] [place],  [address] Phone: [phone]  Fax: [phone]   Clinic Day:  07/23/2019  Referring physician: Einar Pheasant, MD  Chief Complaint: [name] [name] is a 86 y.o. female with  stage I Her2/neu + breast cancer who is seen for a 1 month assessment and last [place].   HPI: The patient was last seen in the medical oncology clinic on 06/25/2019. At that time, she was doing well.  She voiced no complaints. Hematocrit was 36.8, hemoglobin 12.0, platelets 233,000, WBC 9,500.  CA 27.29 was 10.4. She received Kanjinti. She continued calcium and vitamin D.   She received Kanjinti weekly (01/21/202 - 07/16/2019).   During the interim, she has felt "good". Her joint pain is worse with cold weather. She continues to have neuropathy in her toes. She notes that she will receive her second COVID-19 vaccine on 07/28/2019. Her first dose was on 07/07/2019.   The patient does not examine her breast regularly.    Past Medical History:  Diagnosis Date   Diverticulitis of sigmoid colon    Endometriosis    Family history of breast cancer    Family history of ovarian cancer    Family history of prostate cancer    Family history of uterine cancer    Fibrocystic breast disease    Glaucoma    H/O urinary incontinence    History of chicken pox    History of colon polyps 2014   Hyperlipidemia    Hypertension    Ovarian cancer ([place]) 2002   Chemo tx's @ [facility] with Total Hysterectomy. Dr Amalia Hailey   Personal history of chemotherapy    ovarian cancer   Personal history of ovarian cancer    Port-A-Cath in place    Sinusitis    Tuberculosis    positve skin test    Past Surgical History:  Procedure Laterality Date   ABDOMINAL HYSTERECTOMY  2003   complete Dr Amalia Hailey   ABSCESS DRAINAGE  2017   right buttock at [place] EXCISIONAL BIOPSY Left 1993   excisional bx. negative  results   BREAST SURGERY Left 1982   papilloma removal   COLONOSCOPY  2006, 2014   Dr Vira Agar    COLONOSCOPY N/A 03/27/2016   Procedure: COLONOSCOPY;  Surgeon: Robert Bellow, MD;  Location: [facility] ENDOSCOPY;  Service: Endoscopy;  Laterality: N/A;   COLOSTOMY REVERSAL  2014   at [place] Dr Raynaldo Opitz   NASAL SINUS SURGERY     For fungal infection   NASAL SINUS SURGERY  8/05 8/06   OOPHORECTOMY     OSTOMY  05/22/2012   diverticulitis with abcess/ Dr Tamala Julian   ovarian cancer     Exploratory lap   PORTA CATH INSERTION N/A 05/26/2018   Procedure: PORTA CATH INSERTION;  Surgeon: Algernon Huxley, MD;  Location: [facility];  Service: Cardiovascular;  Laterality: N/A;   TONSILLECTOMY AND ADENOIDECTOMY  1947    Family History  Problem Relation Age of Onset   Hypertension Mother    Coronary artery disease Mother    Heart disease Mother    Kidney disease Mother    Diabetes Mother    Cancer Mother        uterine cancer dx 33s   Diabetes Father    Kidney failure Father    Diabetes Brother    Coronary artery disease Brother    Cancer Brother  bladder cancer   Stroke Sister    Heart disease Sister        [name]   Cancer Maternal Aunt 11       ovarian cancer   Breast cancer Cousin    Cancer Brother        Prostate    Social History:  reports that she has never smoked. She has never used smokeless tobacco. She reports that she does not drink alcohol or use drugs. Patient is a retired Engineer, site. Generally accompanied to appointments by friend, [name]. She lives in [place]. The patient is alone today.  Allergies: No Known Allergies  Current Medications: Current Outpatient Medications  Medication Sig Dispense Refill   amLODipine (NORVASC) 2.5 MG tablet TAKE 1 TABLET BY MOUTH EVERY DAY 30 tablet 2   Calcium Carbonate-Vitamin D 600-400 MG-UNIT tablet Take 1 tablet by mouth daily.      Multiple Vitamins-Minerals  (MULTIVITAMIN WITH MINERALS) tablet Take 1 tablet by mouth daily.     tamoxifen (NOLVADEX) 20 MG tablet TAKE 1 TABLET BY MOUTH EVERY DAY 30 tablet 1   prochlorperazine (COMPAZINE) 10 MG tablet Take 1 tablet (10 mg total) by mouth every 6 (six) hours as needed for nausea or vomiting. (Patient not taking: Reported on 11/20/2018) 30 tablet 3   Current Facility-Administered Medications  Medication Dose Route Frequency Provider Last Rate Last Admin   lidocaine-prilocaine (EMLA) cream   Topical PRN Lucky Cowboy, Erskine Squibb, MD       Facility-Administered Medications Ordered in Other Visits  Medication Dose Route Frequency Provider Last Rate Last Admin   heparin lock flush 100 unit/mL  500 Units Intravenous Once Yamir Carignan C, MD       sodium chloride 0.9 % 50 mL with famotidine (PEPCID) 20 mg infusion  20 mg Intravenous Once Charlaine Dalton R, MD       sodium chloride flush (NS) 0.9 % injection 10 mL  10 mL Intravenous PRN Charlaine Dalton R, MD   10 mL at 06/24/18 0854   sodium chloride flush (NS) 0.9 % injection 10 mL  10 mL Intravenous PRN Lequita Asal, MD   10 mL at 07/23/19 0957    Review of Systems  Constitutional: Negative.  Negative for chills, diaphoresis, fever, malaise/fatigue and weight loss (stable).       Feels "good".  HENT: Negative.  Negative for congestion, ear discharge, nosebleeds, sinus pain, sore throat and tinnitus.   Eyes: Negative for blurred vision, double vision and photophobia.       Cataracts.  Respiratory: Negative.  Negative for cough, hemoptysis, sputum production, shortness of breath and wheezing.   Cardiovascular: Negative.  Negative for chest pain, palpitations, leg swelling and PND.  Gastrointestinal: Negative.  Negative for abdominal pain, blood in stool, constipation, diarrhea, melena, nausea and vomiting.       Eating well.   Genitourinary: Negative.  Negative for dysuria, frequency, hematuria and urgency.  Musculoskeletal: Positive for joint  pain (arthritis, bilateral knees; worse with cold weather). Negative for back pain, falls, myalgias and neck pain.  Skin: Negative.  Negative for itching and rash.  Neurological: Positive for sensory change (slight numbness in toes). Negative for dizziness, tingling, tremors, speech change, focal weakness, weakness and headaches.       Gait slow related to arthritis in bilateral knees.  Endo/Heme/Allergies: Negative.  Does not bruise/bleed easily.  Psychiatric/Behavioral: Negative.  Negative for depression and memory loss. The patient is not nervous/anxious and does not have insomnia.  All other systems reviewed and are negative.  Performance status (ECOG): 1  Vitals Blood pressure 132/61, pulse 64, temperature (!) 97.4 F (36.3 C), temperature source Tympanic, resp. rate 18, height '5\' 3"'$  (1.6 m), weight 169 lb 3.3 oz (76.8 kg), SpO2 98 %.   Physical Exam  Constitutional: She is oriented to person, place, and time. She appears well-developed and well-nourished. No distress.  Patient needs assistance onto exam room table.  HENT:  Head: Normocephalic and atraumatic.  Mouth/Throat: Oropharynx is clear and moist. No oropharyngeal exudate.  Curly gray hair. Upper dentures.  Mask.  Eyes: Pupils are equal, round, and reactive to light. Conjunctivae and EOM are normal. No scleral icterus.  Glasses.  Brown eyes.  Neck: No JVD present.  Cardiovascular: Normal rate, regular rhythm and normal heart sounds. Exam reveals no gallop and no friction rub.  No murmur heard. Pulmonary/Chest: Effort normal and breath sounds normal. No respiratory distress. She has no wheezes. She has no rales. Right breast exhibits tenderness (11 o'clock). Left breast exhibits skin change (fibrocystic changes; upper outer quadrant).  RIGHT breast scar tissue at 11 o'clock.  RIGHT breast s/p radiation with slight hyperpigmentation; inferior edema.  Abdominal: Soft. Bowel sounds are normal. She exhibits no distension and no  mass. There is no abdominal tenderness. There is no rebound and no guarding.  Musculoskeletal:        General: Tenderness (bilateral legs) present. No edema. Normal range of motion.     Cervical back: Normal range of motion and neck supple.  Lymphadenopathy:       Head (right side): No preauricular, no posterior auricular and no occipital adenopathy present.       Head (left side): No preauricular, no posterior auricular and no occipital adenopathy present.    She has no cervical adenopathy.    She has no axillary adenopathy.       Right: No inguinal and no supraclavicular adenopathy present.       Left: No inguinal and no supraclavicular adenopathy present.  Neurological: She is alert and oriented to person, place, and time.  Skin: Skin is warm and dry. No rash noted. She is not diaphoretic. No erythema. No pallor.  Psychiatric: She has a normal mood and affect. Her behavior is normal. Judgment and thought content normal.  Nursing note and vitals reviewed.   Infusion on 07/23/2019  Component Date Value Ref Range Status   Sodium 07/23/2019 138  135 - 145 mmol/L Final   Potassium 07/23/2019 3.9  3.5 - 5.1 mmol/L Final   Chloride 07/23/2019 104  98 - 111 mmol/L Final   CO2 07/23/2019 27  22 - 32 mmol/L Final   Glucose, Bld 07/23/2019 81  70 - 99 mg/dL Final   BUN 07/23/2019 16  8 - 23 mg/dL Final   Creatinine, Ser 07/23/2019 0.54  0.44 - 1.00 mg/dL Final   Calcium 07/23/2019 9.2  8.9 - 10.3 mg/dL Final   Total Protein 07/23/2019 6.4* 6.5 - 8.1 g/dL Final   Albumin 07/23/2019 3.7  3.5 - 5.0 g/dL Final   AST 07/23/2019 25  15 - 41 U/L Final   ALT 07/23/2019 22  0 - 44 U/L Final   Alkaline Phosphatase 07/23/2019 49  38 - 126 U/L Final   Total Bilirubin 07/23/2019 0.7  0.3 - 1.2 mg/dL Final   GFR calc non Af Amer 07/23/2019 >60  >60 mL/min Final   GFR calc Af Amer 07/23/2019 >60  >60 mL/min Final   Anion gap 07/23/2019  7  5 - 15 Final   Performed at [facility], [address] [place], [place] [address]   WBC 07/23/2019 8.6  4.0 - 10.5 K/uL Final   RBC 07/23/2019 4.17  3.87 - 5.11 MIL/uL Final   Hemoglobin 07/23/2019 12.8  12.0 - 15.0 g/dL Final   HCT 07/23/2019 39.3  36.0 - 46.0 % Final   MCV 07/23/2019 94.2  80.0 - 100.0 fL Final   MCH 07/23/2019 30.7  26.0 - 34.0 pg Final   MCHC 07/23/2019 32.6  30.0 - 36.0 g/dL Final   RDW 07/23/2019 13.2  11.5 - 15.5 % Final   Platelets 07/23/2019 242  150 - 400 K/uL Final   nRBC 07/23/2019 0.0  0.0 - 0.2 % Final   Neutrophils Relative % 07/23/2019 62  % Final   Neutro Abs 07/23/2019 5.3  1.7 - 7.7 K/uL Final   Lymphocytes Relative 07/23/2019 27  % Final   Lymphs Abs 07/23/2019 2.3  0.7 - 4.0 K/uL Final   Monocytes Relative 07/23/2019 9  % Final   Monocytes Absolute 07/23/2019 0.8  0.1 - 1.0 K/uL Final   Eosinophils Relative 07/23/2019 1  % Final   Eosinophils Absolute 07/23/2019 0.1  0.0 - 0.5 K/uL Final   Basophils Relative 07/23/2019 1  % Final   Basophils Absolute 07/23/2019 0.1  0.0 - 0.1 K/uL Final   Immature Granulocytes 07/23/2019 0  % Final   Abs Immature Granulocytes 07/23/2019 0.03  0.00 - 0.07 K/uL Final   Performed at [facility], [address] [place], [place] [address]    Assessment:  [name] [name] is a 86 y.o. female with stage I Her2/neu + breast cancer s/p lumpectomy and sentinel lymph node biopsy on 03/18/2018. Pathologyrevealed a 0.6 cm grade III invasive mammary carcinoma. There was DCIS. There was no lymphovascular invasion. Tumor was ER + (99%), PR + (25%), and Her2/neu + by FISH. Pathologic stagewas T1bN0.  Screening mammogram on 01/17/2018 revealed calcifications and possible mass in the right breast. Diagnostic mammogram and ultrasoundon 01/22/2018 revealed indeterminate group of microcalcifications over the right upper outer quadrant spanning 4 x 9 x 11 mm. There was a 4 x 7 x 7 mm probable complicated cyst at the 11  o'clock position of the right breast 5 cm from the nipple.  [facility] revealedno evidence of malignancy.   Shereceived12 weeks ofHerceptin and Taxol (05/20/2018 - 08/05/2018).She has continued weekly Herceptin(09/05/2018; 09/26/2018 - 01/08/2019).Herceptin was held on06/04/2020and restarted on 12/18/2018.Herceptin was switched to [place] 01/15/2019 (last02/09/2019).  Shereceivedwhole breastradiationfrom04/11/2018 - 11/05/2018.Tamoxifenbegan 11/20/2018.  CA27.29 has been followed: 10.7 on 10/03/2018,< 3.5 on 10/16/2018, 8.3 on 12/11/2018, < 3.5 on 02/05/2019, 5.1 on 03/05/2019, 10.4 on 06/25/2019, and 11.3 on 07/23/2019.  Echoat [facility] on 03/12/2018 revealed an EF of 61%. Echo at [facility] on03[facility]02[facility]2019 revealed an LVEF of 50 to 55%. There was a trivial pericardial effusion waspresent.[place] 11/12/2018 revealed an EFof 45-50%. [place] 12/10/2018 revealed an EFof55-60%.[place] 02/11/2019 and 05/13/2019 revealed an EF of 50-55%.  He has a history of stage IIB ovarian cancer s/p exploratory laparotomy, TAH/BSO, omentectomy, peritoneal biopsies, selective pelvic and periaortic lymph node sampling on 06/20/2001. Pathologyrevealed a grade 1 papillary serous carcinoma of the ovary. Uterine serosa and the surface of the contralateral ovary were positive for metastatic disease. The primary tumor was approximately 15 cm in diameter. She received 6 cycles ofcarboplatin and Taxol.CA125 was 12.4 on 04/30/2019.  Bone densityon 04/01/2017 revealed osteoporosiswith a T-score of -2.5 in  the left femoral neck.Bone densityon 04/06/2019 revealed osteoporosiswith a T-score of -2.7 inthe left neck femur. She began Proliaon 01/26/2019.  She has grade I neuropathyin her feet. She has chronic right lower extremity edema. Right lower extremity duplexon 07/22/2018 revealed no femoropopliteal and no calf DVT in the visualized  calf veins.  She will receive her second COVID-19 vaccine on 07/28/2019.  Symptomatically, she is doing well.  She has neuropathy in her toes.  Exam is unremarkable.  Plan: 1.   Labs today: CBC with diff, CMP, CA 27.29. 2.Stage I HER-2/neu (+) breast cancer Clinically,she is doing well. Radiation completed on05/27/2020. Shebegantamoxifenon06/04/2019. She continuesKanjinti (Herceptin biosimilar)for 1 year (52 weeks) of adjuvant therapy. She has received 51 of 52 weeks to date. Kanjinti (Herceptin biosimilar)today (final). 3.Chemotherapy-induced neuropathy Neuropathy remains mild. No intervention needed. 4.Osteoporosis Bone density on 04/06/2019 revealedosteoporosiswith a T-score of -2.7inthe left neck femur. Continue calcium and vitamin D. She receivesProlia every 6 months with Dr Nicki Reaper (last08/17/2020). Prolia is due on 07/29/2019. 5.Decline in ejection fraction (EF) Echo on 02/11/2019 revealed an EF of 50-55%. [place] 05/13/2019 revealed an EF of 50-55%. Patient denies any cardiac symptoms.             Discuss plan for echo on 08/11/2019. 6.RTC in 6 weeks for port-a-cath flush.  7.   RTC in 3 months for MD assessment and labs (CBC with diff, CMP, CA 27.29).  I discussed the assessment and treatment plan with the patient.  The patient was provided an opportunity to ask questions and all were answered.  The patient agreed with the plan and demonstrated an understanding of the instructions.  The patient was advised to call back if the symptoms worsen or if the condition fails to improve as anticipated.   Lequita Asal, MD, PhD    07/23/2019, 10:37 AM  I, Selena Batten, am acting as scribe for [employer]. Mike Gip, MD, PhD.  I, Kamden Reber C. Mike Gip, MD, have reviewed the above documentation for accuracy and completeness, and I agree with the above.

## 2019-07-22 ENCOUNTER — Telehealth: Payer: Self-pay

## 2019-07-22 ENCOUNTER — Other Ambulatory Visit: Payer: Self-pay

## 2019-07-22 NOTE — Progress Notes (Signed)
Confirmed Name and DOB. Denies any concerns.  

## 2019-07-22 NOTE — Telephone Encounter (Signed)
Mediical Clearance form has been faxed to her cardiology office.

## 2019-07-23 ENCOUNTER — Encounter: Payer: Self-pay | Admitting: Hematology and Oncology

## 2019-07-23 ENCOUNTER — Inpatient Hospital Stay: Payer: Medicare HMO

## 2019-07-23 ENCOUNTER — Inpatient Hospital Stay (HOSPITAL_BASED_OUTPATIENT_CLINIC_OR_DEPARTMENT_OTHER): Payer: Medicare HMO | Admitting: Hematology and Oncology

## 2019-07-23 VITALS — BP 129/64 | HR 65 | Temp 97.4°F | Resp 17

## 2019-07-23 VITALS — BP 132/61 | HR 64 | Temp 97.4°F | Resp 18 | Ht 63.0 in | Wt 169.2 lb

## 2019-07-23 DIAGNOSIS — Z170 Estrogen receptor positive status [ER+]: Secondary | ICD-10-CM

## 2019-07-23 DIAGNOSIS — C50411 Malignant neoplasm of upper-outer quadrant of right female breast: Secondary | ICD-10-CM | POA: Diagnosis not present

## 2019-07-23 DIAGNOSIS — Z5112 Encounter for antineoplastic immunotherapy: Secondary | ICD-10-CM | POA: Diagnosis not present

## 2019-07-23 DIAGNOSIS — M810 Age-related osteoporosis without current pathological fracture: Secondary | ICD-10-CM | POA: Diagnosis not present

## 2019-07-23 DIAGNOSIS — G620 Drug-induced polyneuropathy: Secondary | ICD-10-CM

## 2019-07-23 DIAGNOSIS — T451X5A Adverse effect of antineoplastic and immunosuppressive drugs, initial encounter: Secondary | ICD-10-CM

## 2019-07-23 LAB — CBC WITH DIFFERENTIAL/PLATELET
Abs Immature Granulocytes: 0.03 10*3/uL (ref 0.00–0.07)
Basophils Absolute: 0.1 10*3/uL (ref 0.0–0.1)
Basophils Relative: 1 %
Eosinophils Absolute: 0.1 10*3/uL (ref 0.0–0.5)
Eosinophils Relative: 1 %
HCT: 39.3 % (ref 36.0–46.0)
Hemoglobin: 12.8 g/dL (ref 12.0–15.0)
Immature Granulocytes: 0 %
Lymphocytes Relative: 27 %
Lymphs Abs: 2.3 10*3/uL (ref 0.7–4.0)
MCH: 30.7 pg (ref 26.0–34.0)
MCHC: 32.6 g/dL (ref 30.0–36.0)
MCV: 94.2 fL (ref 80.0–100.0)
Monocytes Absolute: 0.8 10*3/uL (ref 0.1–1.0)
Monocytes Relative: 9 %
Neutro Abs: 5.3 10*3/uL (ref 1.7–7.7)
Neutrophils Relative %: 62 %
Platelets: 242 10*3/uL (ref 150–400)
RBC: 4.17 MIL/uL (ref 3.87–5.11)
RDW: 13.2 % (ref 11.5–15.5)
WBC: 8.6 10*3/uL (ref 4.0–10.5)
nRBC: 0 % (ref 0.0–0.2)

## 2019-07-23 LAB — COMPREHENSIVE METABOLIC PANEL
ALT: 22 U/L (ref 0–44)
AST: 25 U/L (ref 15–41)
Albumin: 3.7 g/dL (ref 3.5–5.0)
Alkaline Phosphatase: 49 U/L (ref 38–126)
Anion gap: 7 (ref 5–15)
BUN: 16 mg/dL (ref 8–23)
CO2: 27 mmol/L (ref 22–32)
Calcium: 9.2 mg/dL (ref 8.9–10.3)
Chloride: 104 mmol/L (ref 98–111)
Creatinine, Ser: 0.54 mg/dL (ref 0.44–1.00)
GFR calc Af Amer: >60 mL/min (ref >60)
GFR calc non Af Amer: >60 mL/min (ref >60)
Glucose, Bld: 81 mg/dL (ref 70–99)
Potassium: 3.9 mmol/L (ref 3.5–5.1)
Sodium: 138 mmol/L (ref 135–145)
Total Bilirubin: 0.7 mg/dL (ref 0.3–1.2)
Total Protein: 6.4 g/dL — ABNORMAL LOW (ref 6.5–8.1)

## 2019-07-23 MED ORDER — TRASTUZUMAB-ANNS CHEMO 150 MG IV SOLR
150.0000 mg | Freq: Once | INTRAVENOUS | Status: AC
  Administered 2019-07-23: 150 mg via INTRAVENOUS
  Filled 2019-07-23: qty 7.14

## 2019-07-23 MED ORDER — HEPARIN SOD (PORK) LOCK FLUSH 100 UNIT/ML IV SOLN
500.0000 [IU] | Freq: Once | INTRAVENOUS | Status: AC
  Administered 2019-07-23: 12:00:00 500 [IU] via INTRAVENOUS
  Filled 2019-07-23: qty 5

## 2019-07-23 MED ORDER — SODIUM CHLORIDE 0.9 % IV SOLN
Freq: Once | INTRAVENOUS | Status: AC
  Filled 2019-07-23: qty 250

## 2019-07-23 MED ORDER — ACETAMINOPHEN 325 MG PO TABS
650.0000 mg | ORAL_TABLET | Freq: Once | ORAL | Status: AC
  Administered 2019-07-23: 650 mg via ORAL

## 2019-07-23 MED ORDER — SODIUM CHLORIDE 0.9% FLUSH
10.0000 mL | INTRAVENOUS | Status: DC | PRN
  Administered 2019-07-23: 10:00:00 10 mL via INTRAVENOUS
  Filled 2019-07-23: qty 10

## 2019-07-24 LAB — CANCER ANTIGEN 27.29: CA 27.29: 11.3 U/mL (ref 0.0–38.6)

## 2019-07-27 ENCOUNTER — Telehealth: Payer: Self-pay | Admitting: Internal Medicine

## 2019-07-27 NOTE — Telephone Encounter (Signed)
Patient authorization for Prolia received and patient is scheduled for injection on 07/29/19

## 2019-07-29 ENCOUNTER — Ambulatory Visit (INDEPENDENT_AMBULATORY_CARE_PROVIDER_SITE_OTHER): Payer: Medicare HMO

## 2019-07-29 ENCOUNTER — Ambulatory Visit: Payer: Medicare HMO

## 2019-07-29 ENCOUNTER — Other Ambulatory Visit: Payer: Self-pay

## 2019-07-29 DIAGNOSIS — M810 Age-related osteoporosis without current pathological fracture: Secondary | ICD-10-CM | POA: Diagnosis not present

## 2019-07-29 MED ORDER — DENOSUMAB 60 MG/ML ~~LOC~~ SOSY
60.0000 mg | PREFILLED_SYRINGE | Freq: Once | SUBCUTANEOUS | Status: AC
  Administered 2019-07-29: 60 mg via SUBCUTANEOUS

## 2019-07-29 NOTE — Progress Notes (Addendum)
Patient presented for 6-month Prolia injection SQ to right arm. Patient tolerated well.  Reviewed.  Dr Scott 

## 2019-08-10 ENCOUNTER — Ambulatory Visit: Admission: RE | Admit: 2019-08-10 | Payer: Medicare HMO | Source: Ambulatory Visit

## 2019-08-14 ENCOUNTER — Telehealth: Payer: Self-pay

## 2019-08-14 DIAGNOSIS — C50411 Malignant neoplasm of upper-outer quadrant of right female breast: Secondary | ICD-10-CM

## 2019-08-14 DIAGNOSIS — Z170 Estrogen receptor positive status [ER+]: Secondary | ICD-10-CM

## 2019-08-14 NOTE — Telephone Encounter (Signed)
Survivorship Care Plan visit completed.  Treatment summary reviewed and mailed to patient.  ASCO answers booklet reviewed and mailed to patient.  CARE program and Cancer Transitions discussed with patient along with other resources cancer center offers to patients and caregivers.  Patient verbalized understanding.  Pt would like to participate in our Cancer Transitions.  Informed pt right now it is on hold but I put her name on our waiting list when we resume.  Pt in agreement for APP to call her March 19 th to introduce her to [facility].  Informed pt when she sees "Survivorship Clinic" on MyChart this is a telephone visit and she does not come into the clinic.   Pt verbalized understanding.  SCP packet mailed

## 2019-08-28 ENCOUNTER — Inpatient Hospital Stay: Payer: Medicare HMO | Attending: Nurse Practitioner | Admitting: Nurse Practitioner

## 2019-09-02 ENCOUNTER — Other Ambulatory Visit: Payer: Self-pay

## 2019-09-02 ENCOUNTER — Ambulatory Visit
Admission: RE | Admit: 2019-09-02 | Discharge: 2019-09-02 | Disposition: A | Discharge status: Home / Self Care | Payer: Medicare HMO | Source: Ambulatory Visit | Attending: Hematology and Oncology | Admitting: Hematology and Oncology

## 2019-09-02 DIAGNOSIS — I10 Essential (primary) hypertension: Secondary | ICD-10-CM | POA: Insufficient documentation

## 2019-09-02 DIAGNOSIS — Z79899 Other long term (current) drug therapy: Secondary | ICD-10-CM | POA: Diagnosis not present

## 2019-09-02 DIAGNOSIS — Z9221 Personal history of antineoplastic chemotherapy: Secondary | ICD-10-CM | POA: Insufficient documentation

## 2019-09-02 DIAGNOSIS — C50411 Malignant neoplasm of upper-outer quadrant of right female breast: Secondary | ICD-10-CM | POA: Diagnosis not present

## 2019-09-02 DIAGNOSIS — Z5181 Encounter for therapeutic drug level monitoring: Secondary | ICD-10-CM | POA: Diagnosis not present

## 2019-09-02 DIAGNOSIS — Z170 Estrogen receptor positive status [ER+]: Secondary | ICD-10-CM | POA: Diagnosis not present

## 2019-09-02 DIAGNOSIS — I358 Other nonrheumatic aortic valve disorders: Secondary | ICD-10-CM | POA: Insufficient documentation

## 2019-09-02 NOTE — Progress Notes (Signed)
*  PRELIMINARY RESULTS* Echocardiogram 2D Echocardiogram has been performed.  [name] [name] 09/02/2019, 10:34 AM

## 2019-09-03 ENCOUNTER — Inpatient Hospital Stay: Payer: Medicare HMO | Attending: Hematology and Oncology

## 2019-09-03 VITALS — Temp 98.1°F

## 2019-09-03 DIAGNOSIS — Z452 Encounter for adjustment and management of vascular access device: Secondary | ICD-10-CM | POA: Diagnosis present

## 2019-09-03 DIAGNOSIS — C50411 Malignant neoplasm of upper-outer quadrant of right female breast: Secondary | ICD-10-CM | POA: Diagnosis not present

## 2019-09-03 DIAGNOSIS — Z95828 Presence of other vascular implants and grafts: Secondary | ICD-10-CM

## 2019-09-03 MED ORDER — HEPARIN SOD (PORK) LOCK FLUSH 100 UNIT/ML IV SOLN
500.0000 [IU] | Freq: Once | INTRAVENOUS | Status: AC
  Administered 2019-09-03: 500 [IU] via INTRAVENOUS
  Filled 2019-09-03: qty 5

## 2019-09-03 MED ORDER — SODIUM CHLORIDE 0.9% FLUSH
10.0000 mL | INTRAVENOUS | Status: DC | PRN
  Administered 2019-09-03: 10 mL via INTRAVENOUS
  Filled 2019-09-03: qty 10

## 2019-09-22 NOTE — Progress Notes (Signed)
Erroneous encounter. Please disregard.

## 2019-10-14 ENCOUNTER — Other Ambulatory Visit: Payer: Self-pay | Admitting: Internal Medicine

## 2019-10-19 NOTE — Progress Notes (Signed)
[facility]  [address], [address] [place], [place] [address] Phone: [phone]  Fax: [phone]   Clinic Day:  10/21/2019  Referring physician: Einar Pheasant, MD  Chief Complaint: [name] [name] is a 86 y.o. female with  stage I Her2/neu + breast cancer who is seen for a 3 month assessment.  HPI: The patient was last seen in the medical oncology clinic on 07/23/2019. At that time, she was doing well. She had neuropathy in her toes. Exam was unremarkable. CBC was normal. CA 27.29 was 11.3. She received her last Kanjinti to complete a year of adjuvant Her2/neu therapy.  She remained on calcium and vitamin D.  We discussed a follow-up echo in 08/2019.   She received Prolia on 07/29/2019.   Echo on 09/02/2019 revealed an EF of 60-65%.   During the interim, she was feeling "pretty good". She has neuropathy in her toes. She denies falls or stumbling. She has joint pain secondary to arthritis with associated left knee weakness. She is ambulating with a cane now. She denies any chest pain or shortness of breath. She has good bowels. She is wearing pads for her incontinence. She would like to have her port removed ASAP with Dr. Lucky Cowboy. The patient has received both COVID-19 vaccines without complications. She remains on tamoxifen.    Past Medical History:  Diagnosis Date  . Diverticulitis of sigmoid colon   . Endometriosis   . Family history of breast cancer   . Family history of ovarian cancer   . Family history of prostate cancer   . Family history of uterine cancer   . Fibrocystic breast disease   . Glaucoma   . H/O urinary incontinence   . History of chicken pox   . History of colon polyps 2014  . Hyperlipidemia   . Hypertension   . Ovarian cancer ([place]) 2002   Chemo tx's @ [facility] with Total Hysterectomy. Dr Amalia Hailey  . Personal history of chemotherapy    ovarian cancer  . Personal history of ovarian cancer   . Port-A-Cath in place   . Sinusitis   .  Tuberculosis    positve skin test    Past Surgical History:  Procedure Laterality Date  . ABDOMINAL HYSTERECTOMY  2003   complete Dr Amalia Hailey  . ABSCESS DRAINAGE  2017   right buttock at [facility]  . APPENDECTOMY  2003  . BREAST EXCISIONAL BIOPSY Left 1993   excisional bx. negative results  . BREAST SURGERY Left 1982   papilloma removal  . COLONOSCOPY  2006, 2014   Dr Vira Agar   . COLONOSCOPY N/A 03/27/2016   Procedure: COLONOSCOPY;  Surgeon: Robert Bellow, MD;  Location: [facility] ENDOSCOPY;  Service: Endoscopy;  Laterality: N/A;  . COLOSTOMY REVERSAL  2014   at [place] Dr Raynaldo Opitz  . NASAL SINUS SURGERY     For fungal infection  . NASAL SINUS SURGERY  8/05 8/06  . OOPHORECTOMY    . OSTOMY  05/22/2012   diverticulitis with abcess/ Dr Tamala Julian  . ovarian cancer     Exploratory lap  . PORTA CATH INSERTION N/A 05/26/2018   Procedure: PORTA CATH INSERTION;  Surgeon: Algernon Huxley, MD;  Location: [place] CV LAB;  Service: Cardiovascular;  Laterality: N/A;  . TONSILLECTOMY AND ADENOIDECTOMY  1947    Family History  Problem Relation Age of Onset  . Hypertension Mother   . Coronary artery disease Mother   . Heart disease Mother   . Kidney disease  Mother   . Diabetes Mother   . Cancer Mother        uterine cancer dx 5s  . Diabetes Father   . Kidney failure Father   . Diabetes Brother   . Coronary artery disease Brother   . Cancer Brother        bladder cancer  . Stroke Sister   . Heart disease Sister        [facility]  . Cancer Maternal Aunt 49       ovarian cancer  . Breast cancer Cousin   . Cancer Brother        Prostate    Social History:  reports that she has never smoked. She has never used smokeless tobacco. She reports that she does not drink alcohol or use drugs. Patient is a retired Engineer, site. Generally accompanied to appointments by friend, [name]. She lives in [place]. The patient is alone today.  Allergies: No Known Allergies  Current  Medications: Current Outpatient Medications  Medication Sig Dispense Refill  . amLODipine (NORVASC) 2.5 MG tablet TAKE 1 TABLET BY MOUTH EVERY DAY 30 tablet 2  . Calcium Carbonate-Vitamin D 600-400 MG-UNIT tablet Take 1 tablet by mouth daily.     . Multiple Vitamins-Minerals (MULTIVITAMIN WITH MINERALS) tablet Take 1 tablet by mouth daily.    . tamoxifen (NOLVADEX) 20 MG tablet TAKE 1 TABLET BY MOUTH EVERY DAY 30 tablet 1  . prochlorperazine (COMPAZINE) 10 MG tablet Take 1 tablet (10 mg total) by mouth every 6 (six) hours as needed for nausea or vomiting. (Patient not taking: Reported on 11/20/2018) 30 tablet 3   Current Facility-Administered Medications  Medication Dose Route Frequency Provider Last Rate Last Admin  . lidocaine-prilocaine (EMLA) cream   Topical PRN Lucky Cowboy, Erskine Squibb, MD       Facility-Administered Medications Ordered in Other Visits  Medication Dose Route Frequency Provider Last Rate Last Admin  . sodium chloride 0.9 % 50 mL with famotidine (PEPCID) 20 mg infusion  20 mg Intravenous Once Charlaine Dalton R, MD      . sodium chloride flush (NS) 0.9 % injection 10 mL  10 mL Intravenous PRN Cammie Sickle, MD   10 mL at 06/24/18 0854    Review of Systems  Constitutional: Negative.  Negative for chills, diaphoresis, fever, malaise/fatigue and weight loss (up 2 lbs).       Feeling "pretty good". Cane by her side.  HENT: Negative.  Negative for congestion, ear discharge, nosebleeds, sinus pain, sore throat and tinnitus.   Eyes: Negative for blurred vision, double vision and photophobia.       Cataracts.  Respiratory: Negative.  Negative for cough, hemoptysis, sputum production, shortness of breath and wheezing.   Cardiovascular: Negative.  Negative for chest pain, palpitations, leg swelling and PND.  Gastrointestinal: Negative.  Negative for abdominal pain, blood in stool, constipation, diarrhea, melena, nausea and vomiting.       Good bowels.  Genitourinary: Negative.   Negative for dysuria, frequency, hematuria and urgency.       Incontinence; wearing pads.  Musculoskeletal: Positive for joint pain (arthritis, bilateral knees; worse with cold weather). Negative for back pain, falls, myalgias and neck pain.  Skin: Negative.  Negative for itching and rash.  Neurological: Positive for sensory change (slight numbness in toes) and weakness (left knee). Negative for dizziness, tingling, tremors, speech change, focal weakness and headaches.       Gait slow related to arthritis in bilateral knees.  Endo/Heme/Allergies:  Negative.  Does not bruise/bleed easily.  Psychiatric/Behavioral: Negative.  Negative for depression and memory loss. The patient is not nervous/anxious and does not have insomnia.   All other systems reviewed and are negative.  Performance status (ECOG): 1  Vitals Blood pressure (!) 154/60, pulse 61, temperature (!) 97 F (36.1 C), resp. rate 16, weight 171 lb 6.5 oz (77.7 kg), SpO2 99 %.   Physical Exam  Constitutional: She is oriented to person, place, and time. She appears well-developed and well-nourished. No distress.  Patient needs assistance onto exam room table.  HENT:  Head: Normocephalic and atraumatic.  Mouth/Throat: Oropharynx is clear and moist. No oropharyngeal exudate.  Curly gray hair. Upper dentures.  Mask.  Eyes: Pupils are equal, round, and reactive to light. Conjunctivae and EOM are normal. No scleral icterus.  Glasses.  Brown eyes.  Neck: No JVD present.  Cardiovascular: Normal rate, regular rhythm and normal heart sounds. Exam reveals no gallop and no friction rub.  No murmur heard. Pulmonary/Chest: Effort normal and breath sounds normal. No respiratory distress. She has no wheezes. She has no rales. She exhibits edema (right breast). Right breast exhibits tenderness. Right breast exhibits no inverted nipple, no mass, no nipple discharge and no skin change. Left breast exhibits skin change (fibrocystic changes) and  tenderness. Left breast exhibits no inverted nipple, no mass and no nipple discharge.  Abdominal: Soft. Bowel sounds are normal. She exhibits no distension and no mass. There is no abdominal tenderness. There is no rebound and no guarding.  Musculoskeletal:        General: No tenderness or edema. Normal range of motion.     Cervical back: Normal range of motion and neck supple.  Lymphadenopathy:       Head (right side): No preauricular, no posterior auricular and no occipital adenopathy present.       Head (left side): No preauricular, no posterior auricular and no occipital adenopathy present.    She has no cervical adenopathy.    She has no axillary adenopathy.       Right: No supraclavicular adenopathy present.       Left: No supraclavicular adenopathy present.  Neurological: She is alert and oriented to person, place, and time.  Skin: Skin is warm and dry. No rash noted. She is not diaphoretic. No erythema. No pallor.  Psychiatric: She has a normal mood and affect. Her behavior is normal. Judgment and thought content normal.  Nursing note and vitals reviewed.   Infusion on 10/21/2019  Component Date Value Ref Range Status  . WBC 10/21/2019 8.5  4.0 - 10.5 K/uL Final  . RBC 10/21/2019 3.92  3.87 - 5.11 MIL/uL Final  . Hemoglobin 10/21/2019 12.0  12.0 - 15.0 g/dL Final  . HCT 10/21/2019 37.6  36.0 - 46.0 % Final  . MCV 10/21/2019 95.9  80.0 - 100.0 fL Final  . MCH 10/21/2019 30.6  26.0 - 34.0 pg Final  . MCHC 10/21/2019 31.9  30.0 - 36.0 g/dL Final  . RDW 10/21/2019 13.2  11.5 - 15.5 % Final  . Platelets 10/21/2019 268  150 - 400 K/uL Final  . nRBC 10/21/2019 0.0  0.0 - 0.2 % Final  . Neutrophils Relative % 10/21/2019 57  % Final  . Neutro Abs 10/21/2019 4.9  1.7 - 7.7 K/uL Final  . Lymphocytes Relative 10/21/2019 30  % Final  . Lymphs Abs 10/21/2019 2.6  0.7 - 4.0 K/uL Final  . Monocytes Relative 10/21/2019 11  % Final  .  Monocytes Absolute 10/21/2019 0.9  0.1 - 1.0 K/uL Final    . Eosinophils Relative 10/21/2019 1  % Final  . Eosinophils Absolute 10/21/2019 0.1  0.0 - 0.5 K/uL Final  . Basophils Relative 10/21/2019 1  % Final  . Basophils Absolute 10/21/2019 0.1  0.0 - 0.1 K/uL Final  . Immature Granulocytes 10/21/2019 0  % Final  . Abs Immature Granulocytes 10/21/2019 0.01  0.00 - 0.07 K/uL Final   Performed at [facility], [address], [place], [place] [address]    Assessment:  [name] [name] is a 86 y.o. female with stage I Her2/neu + breast cancer s/p lumpectomy and sentinel lymph node biopsy on 03/18/2018. Pathologyrevealed a 0.6 cm grade III invasive mammary carcinoma. There was DCIS. There was no lymphovascular invasion. Tumor was ER + (99%), PR + (25%), and Her2/neu + by FISH. Pathologic stagewas T1bN0.  Screening mammogram on 01/17/2018 revealed calcifications and possible mass in the right breast. Diagnostic mammogram and ultrasoundon 01/22/2018 revealed indeterminate group of microcalcifications over the right upper outer quadrant spanning 4 x 9 x 11 mm. There was a 4 x 7 x 7 mm probable complicated cyst at the 11 o'clock position of the right breast 5 cm from the nipple.  [facility] revealedno evidence of malignancy.   Shereceived12 weeks ofHerceptin and Taxol (05/20/2018 - 08/05/2018).She has continued weekly Herceptin(09/05/2018; 09/26/2018 - 01/08/2019).Herceptin was held on06/04/2020and restarted on 12/18/2018.Herceptin was switched to Nettie 01/15/2019 (last02/09/2019).  Shereceivedwhole breastradiationfrom04/11/2018 - 11/05/2018.Tamoxifenbegan 11/20/2018.  CA27.29 has been followed: 10.7 on 10/03/2018,< 3.5 on 10/16/2018, 8.3 on 12/11/2018, < 3.5 on 02/05/2019, 5.1 on 03/05/2019, 10.4 on 06/25/2019, and 11.3 on 07/23/2019.  Echoat [facility] on 03/12/2018 revealed an EF of 61%. Echo [facility] on[facility]02[facility]2019 revealed an LVEF of 50 to 55%. There was a  trivial pericardial effusion waspresent.Echoon 11/12/2018 revealed an EFof 45-50%. Echoon 12/10/2018 revealed an EFof55-60%.Echoon 02/11/2019 and 05/13/2019 revealed an EF of 50-55%.  Echo on 09/02/2019 revealed an EF of 60-65%.   He has a history of stage IIB ovarian cancer s/p exploratory laparotomy, TAH/BSO, omentectomy, peritoneal biopsies, selective pelvic and periaortic lymph node sampling on 06/20/2001. Pathologyrevealed a grade 1 papillary serous carcinoma of the ovary. Uterine serosa and the surface of the contralateral ovary were positive for metastatic disease. The primary tumor was approximately 15 cm in diameter. She received 6 cycles ofcarboplatin and Taxol.CA125 was 12.4 on 04/30/2019.  Bone densityon 04/01/2017 revealed osteoporosiswith a T-score of -2.5 in the left femoral neck.Bone densityon 04/06/2019 revealed osteoporosiswith a T-score of -2.7 inthe left neck femur. She began Proliaon 01/26/2019 (last 07/29/2019).  She has grade I neuropathyin her feet. She has chronic right lower extremity edema. Right lower extremity duplexon 07/22/2018 revealed no femoropopliteal and no calf DVT in the visualized calf veins.  She will receive her second COVID-19 vaccine on 07/28/2019.  Symptomatically, she feels "pretty good".  She has a residual neuropathy in her toes.  Plan: 1.   Labs today: CBC with diff, CMP, CA 27.29. 2.Stage I HER-2/neu (+) breast cancer Clinically,she is doing well. Radiation completed on05/27/2020. Shebegantamoxifenon06/04/2019. She completed a year of adjuvantKanjinti (Herceptin biosimilar) She is on tamoxifen. Continue surveillance. 3.Chemotherapy-induced neuropathy A mild neuropathy persists in her feet. Continue to monitor. 4.Osteoporosis Bone density on 04/06/2019 revealedosteoporosiswith a T-score of -2.7inthe left neck femur. Continue calcium and vitamin D. She receivesProlia every 6 months  with Dr Nicki Reaper (last02/17/2021). 5.History of decline in ejection fraction (EF) Echo on 02/11/2019 revealed an EF of  50-55%. Echoon 05/13/2019 revealed an EF of 50-55%.  Echoon 09/02/2018 revealed an EF of 60-65%. Patient denies any cardiac symptoms. 6.Port removal by Dr Lucky Cowboy. 7.   RTC in 3 months for MD assessment and labs (CBC with diff, CMP, CA27.29).  I discussed the assessment and treatment plan with the patient.  The patient was provided an opportunity to ask questions and all were answered.  The patient agreed with the plan and demonstrated an understanding of the instructions.  The patient was advised to call back if the symptoms worsen or if the condition fails to improve as anticipated.   Lequita Asal, MD, PhD    10/21/2019, 12:16 PM  I, Selena Batten, am acting as scribe for [employer]. Mike Gip, MD, PhD.  I, Colbie Sliker C. Mike Gip, MD, have reviewed the above documentation for accuracy and completeness, and I agree with the above.

## 2019-10-21 ENCOUNTER — Inpatient Hospital Stay: Payer: Medicare HMO | Attending: Hematology and Oncology

## 2019-10-21 ENCOUNTER — Inpatient Hospital Stay (HOSPITAL_BASED_OUTPATIENT_CLINIC_OR_DEPARTMENT_OTHER): Payer: Medicare HMO | Admitting: Hematology and Oncology

## 2019-10-21 ENCOUNTER — Other Ambulatory Visit: Payer: Self-pay

## 2019-10-21 VITALS — BP 154/60 | HR 61 | Temp 97.0°F | Resp 16 | Wt 171.4 lb

## 2019-10-21 DIAGNOSIS — C50411 Malignant neoplasm of upper-outer quadrant of right female breast: Secondary | ICD-10-CM | POA: Diagnosis not present

## 2019-10-21 DIAGNOSIS — Z95828 Presence of other vascular implants and grafts: Secondary | ICD-10-CM

## 2019-10-21 DIAGNOSIS — M810 Age-related osteoporosis without current pathological fracture: Secondary | ICD-10-CM

## 2019-10-21 DIAGNOSIS — Z7189 Other specified counseling: Secondary | ICD-10-CM

## 2019-10-21 DIAGNOSIS — C50412 Malignant neoplasm of upper-outer quadrant of left female breast: Secondary | ICD-10-CM | POA: Diagnosis not present

## 2019-10-21 DIAGNOSIS — T451X5A Adverse effect of antineoplastic and immunosuppressive drugs, initial encounter: Secondary | ICD-10-CM

## 2019-10-21 DIAGNOSIS — Z170 Estrogen receptor positive status [ER+]: Secondary | ICD-10-CM

## 2019-10-21 DIAGNOSIS — G620 Drug-induced polyneuropathy: Secondary | ICD-10-CM | POA: Diagnosis not present

## 2019-10-21 LAB — COMPREHENSIVE METABOLIC PANEL
ALT: 20 U/L (ref 0–44)
AST: 23 U/L (ref 15–41)
Albumin: 3.1 g/dL — ABNORMAL LOW (ref 3.5–5.0)
Alkaline Phosphatase: 47 U/L (ref 38–126)
Anion gap: 8 (ref 5–15)
BUN: 13 mg/dL (ref 8–23)
CO2: 25 mmol/L (ref 22–32)
Calcium: 8.3 mg/dL — ABNORMAL LOW (ref 8.9–10.3)
Chloride: 106 mmol/L (ref 98–111)
Creatinine, Ser: 0.55 mg/dL (ref 0.44–1.00)
GFR calc Af Amer: >60 mL/min (ref >60)
GFR calc non Af Amer: >60 mL/min (ref >60)
Glucose, Bld: 104 mg/dL — ABNORMAL HIGH (ref 70–99)
Potassium: 3.8 mmol/L (ref 3.5–5.1)
Sodium: 139 mmol/L (ref 135–145)
Total Bilirubin: 0.1 mg/dL — ABNORMAL LOW (ref 0.3–1.2)
Total Protein: 5.9 g/dL — ABNORMAL LOW (ref 6.5–8.1)

## 2019-10-21 LAB — CBC WITH DIFFERENTIAL/PLATELET
Abs Immature Granulocytes: 0.01 10*3/uL (ref 0.00–0.07)
Basophils Absolute: 0.1 10*3/uL (ref 0.0–0.1)
Basophils Relative: 1 %
Eosinophils Absolute: 0.1 10*3/uL (ref 0.0–0.5)
Eosinophils Relative: 1 %
HCT: 37.6 % (ref 36.0–46.0)
Hemoglobin: 12 g/dL (ref 12.0–15.0)
Immature Granulocytes: 0 %
Lymphocytes Relative: 30 %
Lymphs Abs: 2.6 10*3/uL (ref 0.7–4.0)
MCH: 30.6 pg (ref 26.0–34.0)
MCHC: 31.9 g/dL (ref 30.0–36.0)
MCV: 95.9 fL (ref 80.0–100.0)
Monocytes Absolute: 0.9 10*3/uL (ref 0.1–1.0)
Monocytes Relative: 11 %
Neutro Abs: 4.9 10*3/uL (ref 1.7–7.7)
Neutrophils Relative %: 57 %
Platelets: 268 10*3/uL (ref 150–400)
RBC: 3.92 MIL/uL (ref 3.87–5.11)
RDW: 13.2 % (ref 11.5–15.5)
WBC: 8.5 10*3/uL (ref 4.0–10.5)
nRBC: 0 % (ref 0.0–0.2)

## 2019-10-21 MED ORDER — HEPARIN SOD (PORK) LOCK FLUSH 100 UNIT/ML IV SOLN
500.0000 [IU] | Freq: Once | INTRAVENOUS | Status: AC
  Administered 2019-10-21: 500 [IU] via INTRAVENOUS
  Filled 2019-10-21: qty 5

## 2019-10-21 MED ORDER — SODIUM CHLORIDE 0.9% FLUSH
10.0000 mL | Freq: Once | INTRAVENOUS | Status: AC
  Administered 2019-10-21: 12:00:00 10 mL via INTRAVENOUS
  Filled 2019-10-21: qty 10

## 2019-10-21 NOTE — Progress Notes (Signed)
Patient has no new concerns today 

## 2019-10-22 LAB — CANCER ANTIGEN 27.29: CA 27.29: 15.1 U/mL (ref 0.0–38.6)

## 2019-11-03 ENCOUNTER — Telehealth (INDEPENDENT_AMBULATORY_CARE_PROVIDER_SITE_OTHER): Payer: Self-pay

## 2019-11-03 NOTE — Telephone Encounter (Signed)
Spoke with the patient and she is now scheduled with Dr. Lucky Cowboy for a port removal on 11/12/19 with a 10:15 am arrival time to the [facility]. Patient will do covid testing on 11/10/19 between 8-1 pm at the [place]. Pre-procedure instructions were discussed including her not being able to drive herself home. Instructions will be mailed to the patient.

## 2019-11-10 ENCOUNTER — Other Ambulatory Visit: Payer: Self-pay

## 2019-11-10 ENCOUNTER — Other Ambulatory Visit
Admission: RE | Admit: 2019-11-10 | Discharge: 2019-11-10 | Disposition: A | Discharge status: Home / Self Care | Payer: Medicare HMO | Source: Ambulatory Visit | Attending: Vascular Surgery | Admitting: Vascular Surgery

## 2019-11-10 DIAGNOSIS — Z01812 Encounter for preprocedural laboratory examination: Secondary | ICD-10-CM | POA: Diagnosis present

## 2019-11-10 DIAGNOSIS — Z20822 Contact with and (suspected) exposure to covid-19: Secondary | ICD-10-CM | POA: Diagnosis not present

## 2019-11-10 LAB — SARS CORONAVIRUS 2 (TAT 6-24 HRS): SARS Coronavirus 2: NEGATIVE

## 2019-11-11 ENCOUNTER — Other Ambulatory Visit (INDEPENDENT_AMBULATORY_CARE_PROVIDER_SITE_OTHER): Payer: Self-pay | Admitting: Nurse Practitioner

## 2019-11-12 ENCOUNTER — Encounter: Payer: Self-pay | Admitting: Vascular Surgery

## 2019-11-12 ENCOUNTER — Ambulatory Visit
Admission: RE | Admit: 2019-11-12 | Discharge: 2019-11-12 | Disposition: A | Discharge status: Home / Self Care | Payer: Medicare HMO | Source: Ambulatory Visit | Attending: Vascular Surgery | Admitting: Vascular Surgery

## 2019-11-12 ENCOUNTER — Encounter
Admission: RE | Disposition: A | Discharge status: Home / Self Care | Payer: Self-pay | Source: Ambulatory Visit | Attending: Vascular Surgery

## 2019-11-12 ENCOUNTER — Other Ambulatory Visit: Payer: Self-pay

## 2019-11-12 DIAGNOSIS — Z853 Personal history of malignant neoplasm of breast: Secondary | ICD-10-CM | POA: Diagnosis not present

## 2019-11-12 DIAGNOSIS — C50919 Malignant neoplasm of unspecified site of unspecified female breast: Secondary | ICD-10-CM

## 2019-11-12 DIAGNOSIS — Z452 Encounter for adjustment and management of vascular access device: Secondary | ICD-10-CM | POA: Insufficient documentation

## 2019-11-12 HISTORY — PX: PORTA CATH REMOVAL: CATH118286

## 2019-11-12 SURGERY — PORTA CATH REMOVAL
Anesthesia: Moderate Sedation

## 2019-11-12 MED ORDER — MIDAZOLAM HCL 2 MG/ML PO SYRP: 8.0000 mg | ORAL_SOLUTION | Freq: Once | ORAL | Status: DC | PRN

## 2019-11-12 MED ORDER — DIPHENHYDRAMINE HCL 50 MG/ML IJ SOLN: 50.0000 mg | Freq: Once | INTRAMUSCULAR | Status: DC | PRN

## 2019-11-12 MED ORDER — METHYLPREDNISOLONE SODIUM SUCC 125 MG IJ SOLR: 125.0000 mg | Freq: Once | INTRAMUSCULAR | Status: DC | PRN

## 2019-11-12 MED ORDER — FAMOTIDINE 20 MG PO TABS: 40.0000 mg | ORAL_TABLET | Freq: Once | ORAL | Status: DC | PRN

## 2019-11-12 MED ORDER — HYDROMORPHONE HCL 1 MG/ML IJ SOLN: 1.0000 mg | Freq: Once | INTRAMUSCULAR | Status: DC | PRN

## 2019-11-12 MED ORDER — SODIUM CHLORIDE 0.9 % IV SOLN: INTRAVENOUS | Status: DC

## 2019-11-12 MED ORDER — FENTANYL CITRATE (PF) 100 MCG/2ML IJ SOLN
INTRAMUSCULAR | Status: DC | PRN
  Administered 2019-11-12: 50 ug via INTRAVENOUS

## 2019-11-12 MED ORDER — MIDAZOLAM HCL 5 MG/5ML IJ SOLN
INTRAMUSCULAR | Status: AC
  Filled 2019-11-12: qty 5

## 2019-11-12 MED ORDER — FENTANYL CITRATE (PF) 100 MCG/2ML IJ SOLN
INTRAMUSCULAR | Status: AC
  Filled 2019-11-12: qty 2

## 2019-11-12 MED ORDER — CEFAZOLIN SODIUM-DEXTROSE 2-4 GM/100ML-% IV SOLN: 2.0000 g | Freq: Once | INTRAVENOUS | Status: DC

## 2019-11-12 MED ORDER — ONDANSETRON HCL 4 MG/2ML IJ SOLN: 4.0000 mg | Freq: Four times a day (QID) | INTRAMUSCULAR | Status: DC | PRN

## 2019-11-12 MED ORDER — LIDOCAINE-EPINEPHRINE (PF) 1 %-1:200000 IJ SOLN
INTRAMUSCULAR | Status: DC | PRN
  Administered 2019-11-12: 20 mL via INTRADERMAL

## 2019-11-12 MED ORDER — MIDAZOLAM HCL 2 MG/2ML IJ SOLN
INTRAMUSCULAR | Status: DC | PRN
  Administered 2019-11-12: 2 mg via INTRAVENOUS

## 2019-11-12 SURGICAL SUPPLY — 6 items
DERMABOND ADVANCED (GAUZE/BANDAGES/DRESSINGS) ×1
DERMABOND ADVANCED .7 DNX12 (GAUZE/BANDAGES/DRESSINGS) IMPLANT
PACK ANGIOGRAPHY (CUSTOM PROCEDURE TRAY) ×1 IMPLANT
SUT MNCRL AB 4-0 PS2 18 (SUTURE) ×1 IMPLANT
SUT VIC AB 3-0 SH 27 (SUTURE) ×1
SUT VIC AB 3-0 SH 27X BRD (SUTURE) IMPLANT

## 2019-11-12 NOTE — Discharge Instructions (Signed)
Moderate Conscious Sedation, Adult, Care After These instructions provide you with information about caring for yourself after your procedure. Your health care provider may also give you more specific instructions. Your treatment has been planned according to current medical practices, but problems sometimes occur. Call your health care provider if you have any problems or questions after your procedure. What can I expect after the procedure? After your procedure, it is common:  To feel sleepy for several hours.  To feel clumsy and have poor balance for several hours.  To have poor judgment for several hours.  To vomit if you eat too soon. Follow these instructions at home: For at least 24 hours after the procedure:   Do not: ? Participate in activities where you could fall or become injured. ? Drive. ? Use heavy machinery. ? Drink alcohol. ? Take sleeping pills or medicines that cause drowsiness. ? Make important decisions or sign legal documents. ? Take care of children on your own.  Rest. Eating and drinking  Follow the diet recommended by your health care provider.  If you vomit: ? Drink water, juice, or soup when you can drink without vomiting. ? Make sure you have little or no nausea before eating solid foods. General instructions  Have a responsible adult stay with you until you are awake and alert.  Take over-the-counter and prescription medicines only as told by your health care provider.  If you smoke, do not smoke without supervision.  Keep all follow-up visits as told by your health care provider. This is important. Contact a health care provider if:  You keep feeling nauseous or you keep vomiting.  You feel light-headed.  You develop a rash.  You have a fever. Get help right away if:  You have trouble breathing. This information is not intended to replace advice given to you by your health care provider. Make sure you discuss any questions you have  with your health care provider. Document Revised: 05/10/2017 Document Reviewed: 09/17/2015 Elsevier Patient Education  2020 Else[employer] Inc. Implanted Port Removal, Care After This sheet gives you information about how to care for yourself after your procedure. Your health care provider may also give you more specific instructions. If you have problems or questions, contact your health care provider. What can I expect after the procedure? After the procedure, it is common to have:  Soreness or pain near your incision.  Some swelling or bruising near your incision. Follow these instructions at home: Medicines  Take over-the-counter and prescription medicines only as told by your health care provider.  If you were prescribed an antibiotic medicine, take it as told by your health care provider. Do not stop taking the antibiotic even if you start to feel better. Bathing  Do not take baths, swim, or use a hot tub until your health care provider approves. Ask your health care provider if you can take showers. You may only be allowed to take sponge baths. Incision care   Follow instructions from your health care provider about how to take care of your incision. Make sure you: ? Wash your hands with soap and water before you change your bandage (dressing). If soap and water are not available, use hand sanitizer. ? Change your dressing as told by your health care provider. ? Keep your dressing dry. ? Leave stitches (sutures), skin glue, or adhesive strips in place. These skin closures may need to stay in place for 2 weeks or longer. If adhesive strip edges start to loosen   and curl up, you may trim the loose edges. Do not remove adhesive strips completely unless your health care provider tells you to do that.  Check your incision area every day for signs of infection. Check for: ? More redness, swelling, or pain. ? More fluid or blood. ? Warmth. ? Pus or a bad smell. Driving   Do not drive  for 24 hours if you were given a medicine to help you relax (sedative) during your procedure.  If you did not receive a sedative, ask your health care provider when it is safe to drive. Activity  Return to your normal activities as told by your health care provider. Ask your health care provider what activities are safe for you.  Do not lift anything that is heavier than 10 lb (4.5 kg), or the limit that you are told, until your health care provider says that it is safe.  Do not do activities that involve lifting your arms over your head. General instructions  Do not use any products that contain nicotine or tobacco, such as cigarettes and e-cigarettes. These can delay healing. If you need help quitting, ask your health care provider.  Keep all follow-up visits as told by your health care provider. This is important. Contact a health care provider if:  You have more redness, swelling, or pain around your incision.  You have more fluid or blood coming from your incision.  Your incision feels warm to the touch.  You have pus or a bad smell coming from your incision.  You have pain that is not relieved by your pain medicine. Get help right away if you have:  A fever or chills.  Chest pain.  Difficulty breathing. Summary  After the procedure, it is common to have pain, soreness, swelling, or bruising near your incision.  If you were prescribed an antibiotic medicine, take it as told by your health care provider. Do not stop taking the antibiotic even if you start to feel better.  Do not drive for 24 hours if you were given a sedative during your procedure.  Return to your normal activities as told by your health care provider. Ask your health care provider what activities are safe for you. This information is not intended to replace advice given to you by your health care provider. Make sure you discuss any questions you have with your health care provider. Document Revised:  07/11/2017 Document Reviewed: 07/11/2017 Elsevier Patient Education  2020 Elsevier Inc.  

## 2019-11-12 NOTE — Op Note (Signed)
[facility]       Operative Note  Date: 11/12/2019  Preoperative diagnosis:  1. Breast cancer, completed therapy and no longer using port  Postoperative diagnosis:  Same as above  Procedures: #1. Removal of right jugular port a cath   Surgeon: Leotis Pain, MD  Anesthesia: Local with moderate conscious sedation for 15 minutes using 2 mg of Versed and 50 mcg of Fentanyl  Fluoroscopy time: none  Contrast used: 0  Estimated blood loss: Minimal  Indication for the procedure:  The patient is a 86 y.o. female who has completed therapy for breast cancer and no longer needs their Port-A-Cath. The patient desires to have this removed. Risks and benefits including need for potential replacement with recurrent disease were discussed and patient is agreeable to proceed.  Description of procedure: The patient was brought to the vascular and interventional radiology suite. Moderate conscious sedation was administered during a face to face encounter with the patient throughout the procedure with my supervision of the RN administering medicines and monitoring the patient's vital signs, pulse oximetry, telemetry and mental status throughout from the start of the procedure until the patient was taken to the recovery room.  The right neck chest and shoulder were sterilely prepped and draped, and a sterile surgical field was created. The area was then anesthetized with 1% lidocaine copiously. The previous incision was reopened and electrocautery used to dissected down to the port and the catheter. These were dissected free and the catheter was gently removed from the vein in its entirety. The port was dissected out from the fibrous connective tissue and the Prolene sutures were removed. The port was then removed in its entirety including the catheter. The wound was then closed with a 3-0 Vicryl and a 4-0 Monocryl and Dermabond was placed as a dressing.  The patient was then taken to the recovery room in stable condition having tolerated the procedure well.  Complications: none  Condition: stable   Leotis Pain, MD 11/12/2019 1:17 PM   This note was created with Dragon Medical transcription system. Any errors in dictation are purely unintentional.

## 2019-11-12 NOTE — H&P (Signed)
[facility] History & Physical Update  The patient was interviewed and re-examined.  The patient's previous History and Physical has been reviewed and is unchanged.  There is no change in the plan of care. We plan to proceed with the scheduled procedure.  Leotis Pain, MD  11/12/2019, 11:05 AM

## 2019-11-23 ENCOUNTER — Ambulatory Visit (INDEPENDENT_AMBULATORY_CARE_PROVIDER_SITE_OTHER): Payer: Medicare HMO

## 2019-11-23 VITALS — Ht 63.0 in | Wt 170.0 lb

## 2019-11-23 DIAGNOSIS — Z0000 Encounter for general adult medical examination without abnormal findings: Secondary | ICD-10-CM

## 2019-11-23 NOTE — Progress Notes (Signed)
I have reviewed the above note and agree.  Adiana Smelcer, M.D.  

## 2019-11-23 NOTE — Progress Notes (Signed)
Subjective:   [name] [name] is a 86 y.o. female who presents for Medicare Annual (Subsequent) preventive examination.  Review of Systems:  No ROS.  Medicare Wellness Virtual Visit.   Cardiac Risk Factors include: advanced age (>65men, >58 women)     Objective:     Vitals: Ht 5\' 3"  (1.6 m)   Wt 170 lb (77.1 kg)   BMI 30.11 kg/m   Body mass index is 30.11 kg/m.  Advanced Directives 11/23/2019 11/12/2019 07/22/2019 06/25/2019 05/15/2019 04/30/2019 03/03/2019  Does Patient Have a Medical Advance Directive? No No No Yes Yes No No  Type of Advance Directive - - Public librarian;Living will [place];Living will - -  Does patient want to make changes to medical advance directive? - - No - Patient declined - - No - Patient declined -  Copy of [place] in Chart? - - - No - copy requested No - copy requested No - copy requested -  Would patient like information on creating a medical advance directive? No - Patient declined No - Patient declined - - No - Patient declined No - Patient declined -    Tobacco Social History   Tobacco Use  Smoking Status Never Smoker  Smokeless Tobacco Never Used     Counseling given: Not Answered   Clinical Intake:  Pre-visit preparation completed: Yes        Diabetes: No  How often do you need to have someone help you when you read instructions, pamphlets, or other written materials from your doctor or pharmacy?: 1 - Never  Interpreter Needed?: No     Past Medical History:  Diagnosis Date  . Diverticulitis of sigmoid colon   . Endometriosis   . Family history of breast cancer   . Family history of ovarian cancer   . Family history of prostate cancer   . Family history of uterine cancer   . Fibrocystic breast disease   . Glaucoma   . H/O urinary incontinence   . History of chicken pox   . History of colon polyps 2014  . Hyperlipidemia   . Hypertension   . Ovarian cancer ([place])  2002   Chemo tx's @ [facility] with Total Hysterectomy. Dr Amalia Hailey  . Personal history of chemotherapy    ovarian cancer  . Personal history of ovarian cancer   . Port-A-Cath in place   . Sinusitis   . Tuberculosis    positve skin test   Past Surgical History:  Procedure Laterality Date  . ABDOMINAL HYSTERECTOMY  2003   complete Dr Amalia Hailey  . ABSCESS DRAINAGE  2017   right buttock at [facility]  . APPENDECTOMY  2003  . BREAST EXCISIONAL BIOPSY Left 1993   excisional bx. negative results  . BREAST SURGERY Left 1982   papilloma removal  . COLONOSCOPY  2006, 2014   Dr Vira Agar   . COLONOSCOPY N/A 03/27/2016   Procedure: COLONOSCOPY;  Surgeon: Robert Bellow, MD;  Location: [facility] ENDOSCOPY;  Service: Endoscopy;  Laterality: N/A;  . COLOSTOMY REVERSAL  2014   at [facility] Dr Raynaldo Opitz  . NASAL SINUS SURGERY     For fungal infection  . NASAL SINUS SURGERY  8/05 8/06  . OOPHORECTOMY    . OSTOMY  05/22/2012   diverticulitis with abcess/ Dr Tamala Julian  . ovarian cancer     Exploratory lap  . PORTA CATH INSERTION N/A 05/26/2018   Procedure: PORTA CATH INSERTION;  Surgeon: Leotis Pain  S, MD;  Location: [place] [facility];  Service: Cardiovascular;  Laterality: N/A;  . PORTA CATH REMOVAL N/A 11/12/2019   Procedure: PORTA CATH REMOVAL;  Surgeon: Algernon Huxley, MD;  Location: [place] [facility];  Service: Cardiovascular;  Laterality: N/A;  . TONSILLECTOMY AND ADENOIDECTOMY  1947   Family History  Problem Relation Age of Onset  . Hypertension Mother   . Coronary artery disease Mother   . Heart disease Mother   . Kidney disease Mother   . Diabetes Mother   . Cancer Mother        uterine cancer dx 86s  . Diabetes Father   . Kidney failure Father   . Diabetes Brother   . Coronary artery disease Brother   . Cancer Brother        bladder cancer  . Stroke Sister   . Heart disease Sister        [facility]  . Cancer Maternal Aunt 49       ovarian cancer  . Breast cancer Cousin   . Cancer Brother         Prostate   Social History   Socioeconomic History  . Marital status: Widowed    Spouse name: Not on file  . Number of children: 0  . Years of education: Not on file  . Highest education level: Not on file  Occupational History  . Not on file  Tobacco Use  . Smoking status: Never Smoker  . Smokeless tobacco: Never Used  Vaping Use  . Vaping Use: Never used  Substance and Sexual Activity  . Alcohol use: No    Alcohol/week: 0.0 standard drinks  . Drug use: No  . Sexual activity: Not Currently  Other Topics Concern  . Not on file  Social History Narrative   Lives at home alone with dog   Social Determinants of Health   Financial Resource Strain:   . Difficulty of Paying Living Expenses:   Food Insecurity:   . Worried About Charity fundraiser in the Last Year:   . Arboriculturist in the Last Year:   Transportation Needs:   . Film/video editor (Medical):   Marland Kitchen Lack of Transportation (Non-Medical):   Physical Activity:   . Days of Exercise per Week:   . Minutes of Exercise per Session:   Stress:   . Feeling of Stress :   Social Connections:   . Frequency of Communication with Friends and Family:   . Frequency of Social Gatherings with Friends and Family:   . Attends Religious Services:   . Active Member of Clubs or Organizations:   . Attends Archivist Meetings:   Marland Kitchen Marital Status:     Outpatient Encounter Medications as of 11/23/2019  Medication Sig  . amLODipine (NORVASC) 2.5 MG tablet TAKE 1 TABLET BY MOUTH EVERY DAY  . Calcium Carbonate-Vitamin D 600-400 MG-UNIT tablet Take 1 tablet by mouth daily.   . Multiple Vitamins-Minerals (MULTIVITAMIN WITH MINERALS) tablet Take 1 tablet by mouth daily.  . tamoxifen (NOLVADEX) 20 MG tablet TAKE 1 TABLET BY MOUTH EVERY DAY  . prochlorperazine (COMPAZINE) 10 MG tablet Take 1 tablet (10 mg total) by mouth every 6 (six) hours as needed for nausea or vomiting. (Patient not taking: Reported on 11/20/2018)    Facility-Administered Encounter Medications as of 11/23/2019  Medication  . lidocaine-prilocaine (EMLA) cream  . sodium chloride 0.9 % 50 mL with famotidine (PEPCID) 20 mg infusion  . sodium  chloride flush (NS) 0.9 % injection 10 mL    Activities of Daily Living In your present state of health, do you have any difficulty performing the following activities: 11/23/2019 11/12/2019  Hearing? N N  Vision? N N  Difficulty concentrating or making decisions? N N  Walking or climbing stairs? N N  Dressing or bathing? N N  Doing errands, shopping? N -  Preparing Food and eating ? N -  Using the Toilet? N -  In the past six months, have you accidently leaked urine? N -  Do you have problems with loss of bowel control? N -  Managing your Medications? N -  Managing your Finances? N -  Housekeeping or managing your Housekeeping? N -  Some recent data might be hidden    Patient Care Team: Einar Pheasant, MD as PCP - General (Internal Medicine) Caryl Bis Angela Adam, MD as Consulting Physician (Family Medicine) Christene Lye, MD (General Surgery) Einar Pheasant, MD (Internal Medicine) Bary Castilla, Forest Gleason, MD (General Surgery) Lequita Asal, MD as Referring Physician (Hematology and Oncology) Noreene Filbert, MD as Referring Physician (Radiation Oncology) Delana Meyer, Dolores Lory, MD as Consulting Physician (Vascular Surgery)    Assessment:   This is a routine wellness examination for Nysa.  I connected with Shirleyann today by telephone and verified that I am speaking with the correct person using two identifiers. Location patient: home Location provider: work Persons participating in the virtual visit: patient, Marine scientist.    I discussed the limitations, risks, security and privacy concerns of performing an evaluation and management service by telephone and the availability of in person appointments. I also discussed with the patient that there may be a patient responsible charge related  to this service. The patient expressed understanding and verbally consented to this telephonic visit.    Interactive audio and video telecommunications were attempted between this provider and patient, however failed, due to patient having technical difficulties OR patient did not have access to video capability.  We continued and completed visit with audio only.  Some vital signs may be absent or patient reported.   Health Maintenance Due: -Mammogram- completed at [facility]. See care everywhere.  See completed HM at the end of note.   Eye: Visual acuity not assessed. Virtual visit. Followed by their ophthalmologist.  Dental: Dentures- yes  Hearing: Demonstrates normal hearing during visit.  Safety:  Patient feels safe at home- yes Patient does have smoke detectors at home- yes Patient does wear sunscreen or protective clothing when in direct sunlight - yes Patient does wear seat belt when in a moving vehicle - yes Patient drives- yes Adequate lighting in walkways free from debris- yes Grab bars and handrails used as appropriate- yes Ambulates with an assistive device- yes; cane as needed Cell phone on person when ambulating outside of the home-   Social: Alcohol intake - no      Smoking history- never   Smokers in home? none Illicit drug use? none  Medication: Taking as directed and without issues.  Self managed - yes   Covid-19: Precautions and sickness symptoms discussed. Wears mask, social distancing, hand hygiene as appropriate.   Activities of Daily Living Patient denies needing assistance with: household chores, feeding themselves, getting from bed to chair, getting to the toilet, bathing/showering, dressing, managing money, or preparing meals.   Discussed the importance of a healthy diet, water intake and the benefits of aerobic exercise.   Physical activity- active at home, no routine.   Diet:  Regular  Water: good intake Caffeine: 1-2 cup of coffee  Other  Providers Patient Care Team: Einar Pheasant, MD as PCP - General (Internal Medicine) Caryl Bis Angela Adam, MD as Consulting Physician (Family Medicine) Christene Lye, MD (General Surgery) Einar Pheasant, MD (Internal Medicine) Bary Castilla, Forest Gleason, MD (General Surgery) Lequita Asal, MD as Referring Physician (Hematology and Oncology) Noreene Filbert, MD as Referring Physician (Radiation Oncology) Delana Meyer, Dolores Lory, MD as Consulting Physician (Vascular Surgery)  Exercise Activities and Dietary recommendations Current Exercise Habits: Home exercise routine, Intensity: Mild  Goals      Patient Stated   .  Follow up with Primary Care Provider (pt-stated)      As needed Arthritic exercises like stretching more often  I am going to cut back to 1 cup of coffee daily        Fall Risk Fall Risk  11/23/2019 11/19/2018 08/29/2017 03/06/2017 02/22/2016  Falls in the past year? 0 0 No No No  Number falls in past yr: 0 - - - -  Follow up Falls evaluation completed - - - -   Is the patient's home free of loose throw rugs in walkways, pet beds, electrical cords, etc?  Yes      Handrails on the stairs?  Yes      Adequate lighting?  Yes  Timed Get Up and Go performed: No, virtual visit  Depression Screen PHQ 2/9 Scores 11/23/2019 11/19/2018 08/29/2017 03/06/2017  PHQ - 2 Score 0 0 0 0  PHQ- 9 Score - - - 4     Cognitive Function Patient is alert and oriented x3. Patient denies difficulty focusing or concentrating.  MMSE - Mini Mental State Exam 08/29/2017 02/08/2016  Orientation to time 5 5  Orientation to Place 5 5  Registration 3 3  Attention/ Calculation 5 5  Recall 1 3  Language- name 2 objects 2 2  Language- repeat 1 1  Language- follow 3 step command 3 3  Language- read & follow direction 1 1  Write a sentence 1 1  Copy design 1 1  Total score 28 30     6CIT Screen 11/23/2019 11/19/2018  What Year? 0 points 0 points  What month? 0 points 0 points  What time? - 0  points  Count back from 20 - 0 points  Months in reverse 0 points 0 points  Repeat phrase - 0 points  Total Score - 0    Immunization History  Administered Date(s) Administered  . PFIZER SARS-COV-2 Vaccination 07/07/2019, 07/28/2019    Screening Tests Health Maintenance  Topic Date Due  . MAMMOGRAM  01/18/2020  . DEXA SCAN  Completed  . COVID-19 Vaccine  Completed  . INFLUENZA VACCINE  Discontinued  . TETANUS/TDAP  Discontinued  . PNA vac Low Risk Adult  Discontinued    Cancer Screenings: Lung: Low Dose CT Chest recommended if Age 9-80 years, 30 pack-year currently smoking OR have quit w/in 15years. Patient does not qualify.     Plan:   Keep all routine maintenance appointments.   Medicare Attestation I have personally reviewed: The patient's medical and social history Their use of alcohol, tobacco or illicit drugs Their current medications and supplements The patient's functional ability including ADLs,fall risks, home safety risks, cognitive, and hearing and visual impairment Diet and physical activities Evidence for depression    I have reviewed and discussed with patient certain preventive protocols, quality metrics, and best practice recommendations.      Varney Biles, LPN  11/15/3014

## 2019-11-23 NOTE — Patient Instructions (Addendum)
  Ms. Schobert , Thank you for taking time to come for your Medicare Wellness Visit. I appreciate your ongoing commitment to your health goals. Please review the following plan we discussed and let me know if I can assist you in the future.   These are the goals we discussed: Goals      Patient Stated   .  Follow up with Primary Care Provider (pt-stated)      As needed Arthritic exercises like stretching more often  I am going to cut back to 1 cup of coffee daily        This is a list of the screening recommended for you and due dates:  Health Maintenance  Topic Date Due  . Mammogram  01/18/2020  . DEXA scan (bone density measurement)  Completed  . COVID-19 Vaccine  Completed  . Flu Shot  Discontinued  . Tetanus Vaccine  Discontinued  . Pneumonia vaccines  Discontinued

## 2019-12-08 ENCOUNTER — Telehealth: Payer: Self-pay | Admitting: Internal Medicine

## 2019-12-08 NOTE — Telephone Encounter (Signed)
Please schedule her for physical or f/u appt.  I have not seen her since 2019.  Thanks.

## 2019-12-08 NOTE — Telephone Encounter (Signed)
Called pt to schedule an appt. She didn't want to make one at this time because Dr. Mike Gip is following her care at the moment. Pt just wants to countine her prolia with [place] she is due 01/26/20

## 2019-12-29 ENCOUNTER — Other Ambulatory Visit: Payer: Self-pay | Admitting: Hematology and Oncology

## 2019-12-29 DIAGNOSIS — Z170 Estrogen receptor positive status [ER+]: Secondary | ICD-10-CM

## 2019-12-29 DIAGNOSIS — C50411 Malignant neoplasm of upper-outer quadrant of right female breast: Secondary | ICD-10-CM

## 2020-01-06 ENCOUNTER — Other Ambulatory Visit: Payer: Self-pay | Admitting: Internal Medicine

## 2020-01-13 ENCOUNTER — Telehealth: Payer: Self-pay | Admitting: Internal Medicine

## 2020-01-13 NOTE — Telephone Encounter (Signed)
Patient scheduled for Prolia injection .01/27/20

## 2020-01-20 NOTE — Progress Notes (Signed)
[facility]  [address], [address] [place], [place] [address] Phone: [phone]  Fax: [phone]   Clinic Day:  01/21/2020  Referring physician: Einar Pheasant, MD  Chief Complaint: [name] [name] is a 86 y.o. female with  stage I Her2/neu + breast cancer who is seen for a 3 month assessment.  HPI: The patient was last seen in the medical oncology clinic on 10/21/2019. At that time, she felt "pretty good". She had a residual neuropathy in her toes. CBC was normal. Calcium was 8.3. CA27.29 was 15.1. She continued on tamoxifen. She continued on calcium and vitamin D.   Port-a-cath was removed on 11/12/2019 by Dr. Lucky Cowboy.   During the interim, the patient was doing well. She continues to have neuropathy in her toes. The neuropathy does not effect her ambulation but she is cautious and takes her time walking. Reports bilateral knee pain (7/10) related to arthritis. She has some urgency and frequency. Reports nocturia.   She is taking her calcium and tamoxifen. She is not doing monthly breast exams. She will not have her cataracts removed while undergoing chemotherapy. She will follow up in a 1 year to see if the can have cataract surgery.   Next Prolia on 01/26/2020.    Past Medical History:  Diagnosis Date  . Diverticulitis of sigmoid colon   . Endometriosis   . Family history of breast cancer   . Family history of ovarian cancer   . Family history of prostate cancer   . Family history of uterine cancer   . Fibrocystic breast disease   . Glaucoma   . H/O urinary incontinence   . History of chicken pox   . History of colon polyps 2014  . Hyperlipidemia   . Hypertension   . Ovarian cancer ([place]) 2002   Chemo tx's @ [facility] with Total Hysterectomy. Dr Amalia Hailey  . Personal history of chemotherapy    ovarian cancer  . Personal history of ovarian cancer   . Port-A-Cath in place   . Sinusitis   . Tuberculosis    positve skin test    Past Surgical  History:  Procedure Laterality Date  . ABDOMINAL HYSTERECTOMY  2003   complete Dr Amalia Hailey  . ABSCESS DRAINAGE  2017   right buttock at [facility]  . APPENDECTOMY  2003  . BREAST EXCISIONAL BIOPSY Left 1993   excisional bx. negative results  . BREAST SURGERY Left 1982   papilloma removal  . COLONOSCOPY  2006, 2014   Dr Vira Agar   . COLONOSCOPY N/A 03/27/2016   Procedure: COLONOSCOPY;  Surgeon: Robert Bellow, MD;  Location[facility] ENDOSCOPY;  Service: Endoscopy;  Laterality: N/A;  . COLOSTOMY REVERSAL  2014   at [facility] Dr Raynaldo Opitz  . NASAL SINUS SURGERY     For fungal infection  . NASAL SINUS SURGERY  8/05 8/06  . OOPHORECTOMY    . OSTOMY  05/22/2012   diverticulitis with abcess/ Dr Tamala Julian  . ovarian cancer     Exploratory lap  . PORTA CATH INSERTION N/A 05/26/2018   Procedure: PORTA CATH INSERTION;  Surgeon: Algernon Huxley, MD;  Location: [facility];  Service: Cardiovascular;  Laterality: N/A;  . PORTA CATH REMOVAL N/A 11/12/2019   Procedure: PORTA CATH REMOVAL;  Surgeon: Algernon Huxley, MD;  Location: [facility];  Service: Cardiovascular;  Laterality: N/A;  . TONSILLECTOMY AND ADENOIDECTOMY  1947    Family History  Problem Relation Age of Onset  .  Hypertension Mother   . Coronary artery disease Mother   . Heart disease Mother   . Kidney disease Mother   . Diabetes Mother   . Cancer Mother        uterine cancer dx 62s  . Diabetes Father   . Kidney failure Father   . Diabetes Brother   . Coronary artery disease Brother   . Cancer Brother        bladder cancer  . Stroke Sister   . Heart disease Sister        [facility]  . Cancer Maternal Aunt 49       ovarian cancer  . Breast cancer Cousin   . Cancer Brother        Prostate    Social History:  reports that she has never smoked. She has never used smokeless tobacco. She reports that she does not drink alcohol and does not use drugs. Patient is a retired Engineer, site. Generally accompanied to  appointments by friend, [name]. She lives in [place]. The patient is alone today.  Allergies: No Known Allergies  Current Medications: Current Outpatient Medications  Medication Sig Dispense Refill  . amLODipine (NORVASC) 2.5 MG tablet TAKE 1 TABLET BY MOUTH EVERY DAY 90 tablet 1  . Calcium Carbonate-Vitamin D 600-400 MG-UNIT tablet Take 1 tablet by mouth daily.     . Multiple Vitamins-Minerals (MULTIVITAMIN WITH MINERALS) tablet Take 1 tablet by mouth daily.    . tamoxifen (NOLVADEX) 20 MG tablet TAKE 1 TABLET BY MOUTH EVERY DAY 90 tablet 1  . prochlorperazine (COMPAZINE) 10 MG tablet Take 1 tablet (10 mg total) by mouth every 6 (six) hours as needed for nausea or vomiting. (Patient not taking: Reported on 01/21/2020) 30 tablet 3   Current Facility-Administered Medications  Medication Dose Route Frequency Provider Last Rate Last Admin  . lidocaine-prilocaine (EMLA) cream   Topical PRN Lucky Cowboy, Erskine Squibb, MD       Facility-Administered Medications Ordered in Other Visits  Medication Dose Route Frequency Provider Last Rate Last Admin  . sodium chloride 0.9 % 50 mL with famotidine (PEPCID) 20 mg infusion  20 mg Intravenous Once Charlaine Dalton R, MD      . sodium chloride flush (NS) 0.9 % injection 10 mL  10 mL Intravenous PRN Cammie Sickle, MD   10 mL at 06/24/18 0854    Review of Systems  Constitutional: Positive for weight loss (3 lbs). Negative for chills, diaphoresis, fever and malaise/fatigue.       Doing well. Cane by her side.  HENT: Negative.  Negative for congestion, ear discharge, nosebleeds, sinus pain, sore throat and tinnitus.   Eyes: Negative for blurred vision, double vision and photophobia.       Cataracts.  Respiratory: Negative.  Negative for cough, hemoptysis, sputum production, shortness of breath and wheezing.   Cardiovascular: Negative.  Negative for chest pain, palpitations, leg swelling and PND.  Gastrointestinal: Negative.  Negative for abdominal  pain, blood in stool, constipation, diarrhea, melena, nausea and vomiting.  Genitourinary: Positive for frequency and urgency. Negative for dysuria and hematuria.       Incontinence; wearing pads. Nocturia.  Musculoskeletal: Positive for joint pain (arthritis, bilateral knees (7/10); worse with cold weather). Negative for back pain, falls, myalgias and neck pain.  Skin: Negative.  Negative for itching and rash.  Neurological: Positive for sensory change (slight numbness in toes) and weakness (left knee). Negative for dizziness, tingling, tremors, speech change, focal weakness and headaches.  Gait slow related to arthritis in bilateral knees.  Endo/Heme/Allergies: Negative.  Does not bruise/bleed easily.  Psychiatric/Behavioral: Negative.  Negative for depression and memory loss. The patient is not nervous/anxious and does not have insomnia.   All other systems reviewed and are negative.  Performance status (ECOG): 1  Vitals Blood pressure (!) 140/50, pulse 64, temperature (!) 97 F (36.1 C), temperature source Tympanic, resp. rate 18, weight 168 lb 8.7 oz (76.5 kg), SpO2 98 %.   Physical Exam Vitals and nursing note reviewed.  Constitutional:      General: She is not in acute distress.    Appearance: She is well-developed. She is not diaphoretic.     Comments: Patient needs assistance onto exam room table.  HENT:     Head: Normocephalic and atraumatic.     Comments: Black and gray styled hair.    Mouth/Throat:     Pharynx: No oropharyngeal exudate.  Eyes:     General: No scleral icterus.    Conjunctiva/sclera: Conjunctivae normal.     Pupils: Pupils are equal, round, and reactive to light.     Comments: Glasses.  Brown eyes.  Neck:     Vascular: No JVD.  Cardiovascular:     Rate and Rhythm: Normal rate and regular rhythm.     Heart sounds: Normal heart sounds. No murmur heard.  No friction rub. No gallop.   Pulmonary:     Effort: Pulmonary effort is normal. No respiratory  distress.     Breath sounds: Normal breath sounds. No wheezing or rales.  Chest:     Chest wall: Edema (right breast, mild) present.     Breasts:        Right: Skin change (fibrocystic changes inferiorly; post op scarring at 11 o'clock to 12 o'clock 5 cm from the nipple and incision) and tenderness present. No inverted nipple, mass or nipple discharge.        Left: Skin change (fibrocystic changes inferoirally and medially) and tenderness (medially) present. No inverted nipple, mass or nipple discharge.  Abdominal:     General: Bowel sounds are normal. There is no distension.     Palpations: Abdomen is soft. There is no mass.     Tenderness: There is no abdominal tenderness. There is no guarding or rebound.  Musculoskeletal:        General: Tenderness (BLE) present. Normal range of motion.     Cervical back: Normal range of motion and neck supple.  Lymphadenopathy:     Head:     Right side of head: No preauricular, posterior auricular or occipital adenopathy.     Left side of head: No preauricular, posterior auricular or occipital adenopathy.     Cervical: No cervical adenopathy.     Upper Body:     Right upper body: No supraclavicular or axillary adenopathy.     Left upper body: No supraclavicular or axillary adenopathy.     Lower Body: No right inguinal adenopathy. No left inguinal adenopathy.  Skin:    General: Skin is warm and dry.     Coloration: Skin is not pale.     Findings: No erythema or rash.  Neurological:     Mental Status: She is alert and oriented to person, place, and time.  Psychiatric:        Behavior: Behavior normal.        Thought Content: Thought content normal.        Judgment: Judgment normal.    Appointment on 01/21/2020  Component  Date Value Ref Range Status  . Sodium 01/21/2020 138  135 - 145 mmol/L Final  . Potassium 01/21/2020 4.0  3.5 - 5.1 mmol/L Final  . Chloride 01/21/2020 105  98 - 111 mmol/L Final  . CO2 01/21/2020 26  22 - 32 mmol/L Final   . Glucose, Bld 01/21/2020 90  70 - 99 mg/dL Final   Glucose reference range applies only to samples taken after fasting for at least 8 hours.  . BUN 01/21/2020 16  8 - 23 mg/dL Final  . Creatinine, Ser 01/21/2020 0.61  0.44 - 1.00 mg/dL Final  . Calcium 01/21/2020 8.6* 8.9 - 10.3 mg/dL Final  . Total Protein 01/21/2020 5.8* 6.5 - 8.1 g/dL Final  . Albumin 01/21/2020 3.1* 3.5 - 5.0 g/dL Final  . AST 01/21/2020 21  15 - 41 U/L Final  . ALT 01/21/2020 20  0 - 44 U/L Final  . Alkaline Phosphatase 01/21/2020 44  38 - 126 U/L Final  . Total Bilirubin 01/21/2020 0.4  0.3 - 1.2 mg/dL Final  . GFR calc non Af Amer 01/21/2020 >60  >60 mL/min Final  . GFR calc Af Amer 01/21/2020 >60  >60 mL/min Final  . Anion gap 01/21/2020 7  5 - 15 Final   Performed at [facility], [address], [place], [place] [address]  . WBC 01/21/2020 8.7  4.0 - 10.5 K/uL Final  . RBC 01/21/2020 4.11  3.87 - 5.11 MIL/uL Final  . Hemoglobin 01/21/2020 12.6  12.0 - 15.0 g/dL Final  . HCT 01/21/2020 39.0  36 - 46 % Final  . MCV 01/21/2020 94.9  80.0 - 100.0 fL Final  . MCH 01/21/2020 30.7  26.0 - 34.0 pg Final  . MCHC 01/21/2020 32.3  30.0 - 36.0 g/dL Final  . RDW 01/21/2020 13.4  11.5 - 15.5 % Final  . Platelets 01/21/2020 280  150 - 400 K/uL Final  . nRBC 01/21/2020 0.0  0.0 - 0.2 % Final  . Neutrophils Relative % 01/21/2020 61  % Final  . Neutro Abs 01/21/2020 5.3  1.7 - 7.7 K/uL Final  . Lymphocytes Relative 01/21/2020 28  % Final  . Lymphs Abs 01/21/2020 2.5  0.7 - 4.0 K/uL Final  . Monocytes Relative 01/21/2020 9  % Final  . Monocytes Absolute 01/21/2020 0.8  0 - 1 K/uL Final  . Eosinophils Relative 01/21/2020 1  % Final  . Eosinophils Absolute 01/21/2020 0.1  0 - 0 K/uL Final  . Basophils Relative 01/21/2020 1  % Final  . Basophils Absolute 01/21/2020 0.0  0 - 0 K/uL Final  . Immature Granulocytes 01/21/2020 0  % Final  . Abs Immature Granulocytes 01/21/2020 0.03  0.00 - 0.07 K/uL Final    Performed at [facility], [address] [place], [place] [address]    Assessment:  [name] [name] is a 86 y.o. female with stage I Her2/neu + breast cancer s/p lumpectomy and sentinel lymph node biopsy on 03/18/2018. Pathologyrevealed a 0.6 cm grade III invasive mammary carcinoma. There was DCIS. There was no lymphovascular invasion. Tumor was ER + (99%), PR + (25%), and Her2/neu + by FISH. Pathologic stagewas T1bN0.  Screening mammogram on 01/17/2018 revealed calcifications and possible mass in the right breast. Diagnostic mammogram and ultrasoundon 01/22/2018 revealed indeterminate group of microcalcifications over the right upper outer quadrant spanning 4 x 9 x 11 mm. There was a 4 x 7 x 7 mm probable complicated cyst at the 11 o'clock position  of the right breast 5 cm from the nipple.  [facility] revealedno evidence of malignancy.   Shereceived12 weeks ofHerceptin and Taxol (05/20/2018 - 08/05/2018).She has continued weekly Herceptin(09/05/2018; 09/26/2018 - 01/08/2019).Herceptin was held on06/04/2020and restarted on 12/18/2018.Herceptin was switched to [place] 01/15/2019 (last02/09/2019).  Shereceivedwhole breastradiationfrom04/11/2018 - 11/05/2018.Tamoxifenbegan 11/20/2018.  CA27.29 has been followed: 10.7 on 10/03/2018,< 3.5 on 10/16/2018, 8.3 on 12/11/2018, < 3.5 on 02/05/2019, 5.1 on 03/05/2019, 10.4 on 06/25/2019, 11.3 on 07/23/2019, 15.1 on 10/21/2019 and 7.4 on 01/21/2020.  Echoat [facility] on 03/12/2018 revealed an EF of 61%. Echo at [facility] on03/02/2019 revealed an LVEF of 50 to 55%. There was a trivial pericardial effusion waspresent.[place] 11/12/2018 revealed an EFof 45-50%. [place] 12/10/2018 revealed an EFof55-60%.[place] 02/11/2019 and 05/13/2019 revealed an EF of 50-55%.  Echo on 09/02/2019 revealed an EF of 60-65%.   He has a history of stage IIB ovarian cancer s/p  exploratory laparotomy, TAH/BSO, omentectomy, peritoneal biopsies, selective pelvic and periaortic lymph node sampling on 06/20/2001. Pathologyrevealed a grade 1 papillary serous carcinoma of the ovary. Uterine serosa and the surface of the contralateral ovary were positive for metastatic disease. The primary tumor was approximately 15 cm in diameter. She received 6 cycles ofcarboplatin and Taxol.CA125 was 12.4 on 04/30/2019.  Bone densityon 04/01/2017 revealed osteoporosiswith a T-score of -2.5 in the left femoral neck.Bone densityon 04/06/2019 revealed osteoporosiswith a T-score of -2.7 inthe left neck femur. She began Proliaon 01/26/2019 (last 07/29/2019).  She has grade I neuropathyin her feet. She has chronic right lower extremity edema. Right lower extremity duplexon 07/22/2018 revealed no femoropopliteal and no calf DVT in the visualized calf veins.  She will receive her second COVID-19 vaccine on 07/28/2019.  Symptomatically, she is doing well.  She continues to have neuropathy in her toes.  Exam is stable.  Plan: 1.   Labs today: CBC with diff, CMP, CA 27.29. 2.Stage I HER-2/neu (+) breast cancer Clinically,she continues to do well. Radiation completed on05/27/2020. Shebegantamoxifenon06/04/2019. She completed a year of adjuvantKanjinti (Herceptin biosimilar) She remains on tamoxifen. Bilateral mammogram on 02/08/2020. 3.Chemotherapy-induced neuropathy She continues to have neuropathy in her toes. No intervention needed. 4.Osteoporosis Bone density on 04/06/2019 revealedosteoporosiswith a T-score of -2.7inthe left neck femur. Continue calcium and vitamin D. She receives Prolia every 6 months with Dr. Nicki Reaper (next scheduled on 01/26/2020). 5.RTC in 3 months for MD assessment and labs (CBC with diff, CMP, CA 27.29).  I discussed the assessment and treatment plan with the patient.  The patient was provided an opportunity to ask  questions and all were answered.  The patient agreed with the plan and demonstrated an understanding of the instructions.  The patient was advised to call back if the symptoms worsen or if the condition fails to improve as anticipated.  I provided 20 minutes of face-to-face time during this this encounter and > 50% was spent counseling as documented under my assessment and plan.  I provided these services from the [facility] office.   Lequita Asal, MD, PhD    01/21/2020, 11:14 AM  I, Selena Batten, am acting as scribe for [employer]. Mike Gip, MD, PhD.  I, Melissa C. Mike Gip, MD, have reviewed the above documentation for accuracy and completeness, and I agree with the above.

## 2020-01-21 ENCOUNTER — Inpatient Hospital Stay: Payer: Medicare HMO

## 2020-01-21 ENCOUNTER — Other Ambulatory Visit: Payer: Self-pay

## 2020-01-21 ENCOUNTER — Inpatient Hospital Stay: Payer: Medicare HMO | Attending: Hematology and Oncology | Admitting: Hematology and Oncology

## 2020-01-21 ENCOUNTER — Encounter: Payer: Self-pay | Admitting: Hematology and Oncology

## 2020-01-21 VITALS — BP 140/50 | HR 64 | Temp 97.0°F | Resp 18 | Wt 168.5 lb

## 2020-01-21 DIAGNOSIS — C50411 Malignant neoplasm of upper-outer quadrant of right female breast: Secondary | ICD-10-CM | POA: Diagnosis not present

## 2020-01-21 DIAGNOSIS — Z923 Personal history of irradiation: Secondary | ICD-10-CM | POA: Diagnosis not present

## 2020-01-21 DIAGNOSIS — Z79810 Long term (current) use of selective estrogen receptor modulators (SERMs): Secondary | ICD-10-CM | POA: Diagnosis not present

## 2020-01-21 DIAGNOSIS — T451X5A Adverse effect of antineoplastic and immunosuppressive drugs, initial encounter: Secondary | ICD-10-CM

## 2020-01-21 DIAGNOSIS — Z170 Estrogen receptor positive status [ER+]: Secondary | ICD-10-CM | POA: Insufficient documentation

## 2020-01-21 DIAGNOSIS — M810 Age-related osteoporosis without current pathological fracture: Secondary | ICD-10-CM

## 2020-01-21 DIAGNOSIS — G620 Drug-induced polyneuropathy: Secondary | ICD-10-CM

## 2020-01-21 LAB — CBC WITH DIFFERENTIAL/PLATELET
Abs Immature Granulocytes: 0.03 10*3/uL (ref 0.00–0.07)
Basophils Absolute: 0 10*3/uL (ref 0.0–0.1)
Basophils Relative: 1 %
Eosinophils Absolute: 0.1 10*3/uL (ref 0.0–0.5)
Eosinophils Relative: 1 %
HCT: 39 % (ref 36.0–46.0)
Hemoglobin: 12.6 g/dL (ref 12.0–15.0)
Immature Granulocytes: 0 %
Lymphocytes Relative: 28 %
Lymphs Abs: 2.5 10*3/uL (ref 0.7–4.0)
MCH: 30.7 pg (ref 26.0–34.0)
MCHC: 32.3 g/dL (ref 30.0–36.0)
MCV: 94.9 fL (ref 80.0–100.0)
Monocytes Absolute: 0.8 10*3/uL (ref 0.1–1.0)
Monocytes Relative: 9 %
Neutro Abs: 5.3 10*3/uL (ref 1.7–7.7)
Neutrophils Relative %: 61 %
Platelets: 280 10*3/uL (ref 150–400)
RBC: 4.11 MIL/uL (ref 3.87–5.11)
RDW: 13.4 % (ref 11.5–15.5)
WBC: 8.7 10*3/uL (ref 4.0–10.5)
nRBC: 0 % (ref 0.0–0.2)

## 2020-01-21 LAB — COMPREHENSIVE METABOLIC PANEL
ALT: 20 U/L (ref 0–44)
AST: 21 U/L (ref 15–41)
Albumin: 3.1 g/dL — ABNORMAL LOW (ref 3.5–5.0)
Alkaline Phosphatase: 44 U/L (ref 38–126)
Anion gap: 7 (ref 5–15)
BUN: 16 mg/dL (ref 8–23)
CO2: 26 mmol/L (ref 22–32)
Calcium: 8.6 mg/dL — ABNORMAL LOW (ref 8.9–10.3)
Chloride: 105 mmol/L (ref 98–111)
Creatinine, Ser: 0.61 mg/dL (ref 0.44–1.00)
GFR calc Af Amer: >60 mL/min (ref >60)
GFR calc non Af Amer: >60 mL/min (ref >60)
Glucose, Bld: 90 mg/dL (ref 70–99)
Potassium: 4 mmol/L (ref 3.5–5.1)
Sodium: 138 mmol/L (ref 135–145)
Total Bilirubin: 0.4 mg/dL (ref 0.3–1.2)
Total Protein: 5.8 g/dL — ABNORMAL LOW (ref 6.5–8.1)

## 2020-01-21 NOTE — Progress Notes (Signed)
Patient c/o pain to bilateral knees ( 7)

## 2020-01-22 LAB — CANCER ANTIGEN 27.29: CA 27.29: 7.4 U/mL (ref 0.0–38.6)

## 2020-01-27 ENCOUNTER — Ambulatory Visit (INDEPENDENT_AMBULATORY_CARE_PROVIDER_SITE_OTHER): Payer: Medicare HMO

## 2020-01-27 ENCOUNTER — Other Ambulatory Visit: Payer: Self-pay

## 2020-01-27 DIAGNOSIS — M810 Age-related osteoporosis without current pathological fracture: Secondary | ICD-10-CM

## 2020-01-27 MED ORDER — DENOSUMAB 60 MG/ML ~~LOC~~ SOSY
60.0000 mg | PREFILLED_SYRINGE | Freq: Once | SUBCUTANEOUS | Status: AC
  Administered 2020-01-27: 60 mg via SUBCUTANEOUS

## 2020-01-27 NOTE — Progress Notes (Addendum)
Patient presented for 96-month Prolia injection SQ to left arm. Patient tolerated well.  Reviewed.  Dr Nicki Reaper

## 2020-02-09 ENCOUNTER — Inpatient Hospital Stay
Admission: RE | Admit: 2020-02-09 | Discharge: 2020-02-09 | Disposition: A | Discharge status: Home / Self Care | Payer: Self-pay | Source: Ambulatory Visit | Attending: *Deleted | Admitting: *Deleted

## 2020-02-09 ENCOUNTER — Ambulatory Visit
Admission: RE | Admit: 2020-02-09 | Discharge: 2020-02-09 | Disposition: A | Discharge status: Home / Self Care | Payer: Medicare HMO | Source: Ambulatory Visit | Attending: Hematology and Oncology | Admitting: Hematology and Oncology

## 2020-02-09 ENCOUNTER — Other Ambulatory Visit: Payer: Self-pay | Admitting: *Deleted

## 2020-02-09 ENCOUNTER — Other Ambulatory Visit: Payer: Self-pay

## 2020-02-09 DIAGNOSIS — Z170 Estrogen receptor positive status [ER+]: Secondary | ICD-10-CM

## 2020-02-09 DIAGNOSIS — C50411 Malignant neoplasm of upper-outer quadrant of right female breast: Secondary | ICD-10-CM

## 2020-02-09 DIAGNOSIS — Z1231 Encounter for screening mammogram for malignant neoplasm of breast: Secondary | ICD-10-CM

## 2020-04-20 NOTE — Progress Notes (Signed)
[facility]  [address] [place], [place] [address] Phone: [phone]  Fax: [phone]   Clinic Day:  04/21/2020  Referring physician: Dale Savonburg, MD  Chief Complaint: [name] [name] is a 86 y.o. female with  stage I Her2/neu + breast cancer who is seen for 3 month assessment.  HPI: The patient was last seen in the medical oncology clinic on 01/21/2020. At that time, she was doing well.  She continued to have neuropathy in her toes.  Exam was stable. Hematocrit was 39.0, hemoglobin 12.6, platelets 280,000, WBC 8,700. Calcium was 8.6 and albumin 3.1. CA27.29 was 7.4. She continued calcium, vitamin D, and tamoxifen.  Diagnostic mammogram on 02/09/2020 revealed no evidence of malignancy.  During the interim, she has been "ok". The neuropathy in her toes is mild and does not affect her function. She has not tripped or fallen at all. Her biggest problem is her arthritis, which is worse in her knees and causes her to walk slowly. Her urinary symptoms are stable.  She is still taking tamoxifen. She does not perform monthly breast exams and does not have any breast concerns. She received Prolia in 01/2020.   Past Medical History:  Diagnosis Date  . Diverticulitis of sigmoid colon   . Endometriosis   . Family history of breast cancer   . Family history of ovarian cancer   . Family history of prostate cancer   . Family history of uterine cancer   . Fibrocystic breast disease   . Glaucoma   . H/O urinary incontinence   . History of chicken pox   . History of colon polyps 2014  . Hyperlipidemia   . Hypertension   . Ovarian cancer (HCC) 2002   Chemo tx's @ [facility] with Total Hysterectomy. Dr Logan Bores  . Personal history of chemotherapy    ovarian cancer  . Personal history of ovarian cancer   . Port-A-Cath in place   . Sinusitis   . Tuberculosis    positve skin test    Past Surgical History:  Procedure Laterality Date  . ABDOMINAL  HYSTERECTOMY  2003   complete Dr Logan Bores  . ABSCESS DRAINAGE  2017   right buttock at [facility]  . APPENDECTOMY  2003  . BREAST EXCISIONAL BIOPSY Left 1993   excisional bx. negative results  . BREAST LUMPECTOMY Right 03/18/2018   done at [facility] breast ca rad and chemo Invasive ductal carcinoma with focal squamous differentiation, Nottingham grade 3, is identified forming a 6 mm mass, associated with high-grade ductal carcinoma in situ, radial scar and biopsy site changes.  Marland Kitchen BREAST SURGERY Left 1982   papilloma removal  . COLONOSCOPY  2006, 2014   Dr Mechele Collin   . COLONOSCOPY N/A 03/27/2016   Procedure: COLONOSCOPY;  Surgeon: Earline Mayotte, MD;  Location: [facility] ENDOSCOPY;  Service: Endoscopy;  Laterality: N/A;  . COLOSTOMY REVERSAL  2014   at [facility] Dr Joaquim Nam  . NASAL SINUS SURGERY     For fungal infection  . NASAL SINUS SURGERY  8/05 8/06  . OOPHORECTOMY    . OSTOMY  05/22/2012   diverticulitis with abcess/ Dr Katrinka Blazing  . ovarian cancer     Exploratory lap  . PORTA CATH INSERTION N/A 05/26/2018   Procedure: PORTA CATH INSERTION;  Surgeon: Annice Needy, MD;  Location: [facility] INVASIVE CV LAB;  Service: Cardiovascular;  Laterality: N/A;  . PORTA CATH REMOVAL N/A 11/12/2019   Procedure: PORTA CATH REMOVAL;  Surgeon: Annice Needy,  MD;  Location: [facility] CV LAB;  Service: Cardiovascular;  Laterality: N/A;  . TONSILLECTOMY AND ADENOIDECTOMY  1947    Family History  Problem Relation Age of Onset  . Hypertension Mother   . Coronary artery disease Mother   . Heart disease Mother   . Kidney disease Mother   . Diabetes Mother   . Cancer Mother        uterine cancer dx 45s  . Diabetes Father   . Kidney failure Father   . Diabetes Brother   . Coronary artery disease Brother   . Cancer Brother        bladder cancer  . Stroke Sister   . Heart disease Sister        [facility]  . Cancer Maternal Aunt 49       ovarian cancer  . Breast cancer Cousin   . Cancer Brother        Prostate     Social History:  reports that she has never smoked. She has never used smokeless tobacco. She reports that she does not drink alcohol and does not use drugs. Patient is a retired Engineer, site. Generally accompanied to appointments by friend, [name]. She lives in [place]. The patient is alone today.  Allergies: No Known Allergies  Current Medications: Current Outpatient Medications  Medication Sig Dispense Refill  . amLODipine (NORVASC) 2.5 MG tablet TAKE 1 TABLET BY MOUTH EVERY DAY 90 tablet 1  . Calcium Carbonate-Vitamin D 600-400 MG-UNIT tablet Take 1 tablet by mouth daily.     . Multiple Vitamins-Minerals (MULTIVITAMIN WITH MINERALS) tablet Take 1 tablet by mouth daily.    . tamoxifen (NOLVADEX) 20 MG tablet TAKE 1 TABLET BY MOUTH EVERY DAY 90 tablet 1  . prochlorperazine (COMPAZINE) 10 MG tablet Take 1 tablet (10 mg total) by mouth every 6 (six) hours as needed for nausea or vomiting. (Patient not taking: Reported on 01/21/2020) 30 tablet 3   Current Facility-Administered Medications  Medication Dose Route Frequency Provider Last Rate Last Admin  . lidocaine-prilocaine (EMLA) cream   Topical PRN Lucky Cowboy, Erskine Squibb, MD       Facility-Administered Medications Ordered in Other Visits  Medication Dose Route Frequency Provider Last Rate Last Admin  . sodium chloride 0.9 % 50 mL with famotidine (PEPCID) 20 mg infusion  20 mg Intravenous Once Charlaine Dalton R, MD      . sodium chloride flush (NS) 0.9 % injection 10 mL  10 mL Intravenous PRN Cammie Sickle, MD   10 mL at 06/24/18 0854    Review of Systems  Constitutional: Negative for chills, diaphoresis, fever, malaise/fatigue and weight loss (up 2 lbs).       Feels "okay."  HENT: Negative.  Negative for congestion, ear discharge, ear pain, hearing loss, nosebleeds, sinus pain, sore throat and tinnitus.   Eyes: Negative for blurred vision, double vision and photophobia.       Cataracts.  Respiratory: Negative.   Negative for cough, hemoptysis, sputum production, shortness of breath and wheezing.   Cardiovascular: Negative.  Negative for chest pain, palpitations, leg swelling and PND.  Gastrointestinal: Negative.  Negative for abdominal pain, blood in stool, constipation, diarrhea, heartburn, melena, nausea and vomiting.  Genitourinary: Positive for frequency and urgency. Negative for dysuria and hematuria.       Incontinence; wearing pads. Nocturia.  Musculoskeletal: Positive for joint pain (arthritis, worse in knees). Negative for back pain, falls, myalgias and neck pain.  Skin: Negative.  Negative for  itching and rash.  Neurological: Positive for sensory change (mild numbness in toes). Negative for dizziness, tingling, tremors, speech change, focal weakness, weakness and headaches.  Endo/Heme/Allergies: Negative.  Does not bruise/bleed easily.  Psychiatric/Behavioral: Negative.  Negative for depression and memory loss. The patient is not nervous/anxious and does not have insomnia.   All other systems reviewed and are negative.  Performance status (ECOG): 1  Vitals Blood pressure (!) 142/56, pulse 74, temperature 98.5 F (36.9 C), temperature source Tympanic, weight 170 lb 15.5 oz (77.6 kg), SpO2 98 %.   Physical Exam Vitals and nursing note reviewed.  Constitutional:      General: She is not in acute distress.    Appearance: She is well-developed. She is not diaphoretic.  HENT:     Head: Normocephalic and atraumatic.     Comments: Curly gray hair.    Mouth/Throat:     Mouth: Mucous membranes are moist.     Pharynx: Oropharynx is clear. No oropharyngeal exudate.  Eyes:     General: No scleral icterus.    Extraocular Movements: Extraocular movements intact.     Conjunctiva/sclera: Conjunctivae normal.     Pupils: Pupils are equal, round, and reactive to light.     Comments: Glasses.  Brown eyes.  Neck:     Vascular: No JVD.  Cardiovascular:     Rate and Rhythm: Normal rate and regular  rhythm.     Heart sounds: Normal heart sounds. No murmur heard.  No friction rub. No gallop.   Pulmonary:     Effort: Pulmonary effort is normal. No respiratory distress.     Breath sounds: Normal breath sounds. No wheezing or rales.  Chest:     Chest wall: No tenderness.     Breasts:        Right: Skin change (fibrocystic changes superiorly (11-1 o clock, 4-5 cm from nipple) and inferiorly) and tenderness (worse inferiorly) present. No inverted nipple, mass or nipple discharge.        Left: Skin change (fibrocystic changes inferiorly, at 2-3 o clock, and at 10-12 o clock) and tenderness present. No inverted nipple, mass or nipple discharge.  Abdominal:     General: Bowel sounds are normal. There is no distension.     Palpations: Abdomen is soft. There is no mass.     Tenderness: There is no abdominal tenderness. There is no guarding or rebound.  Musculoskeletal:        General: Tenderness (BLE) present. Normal range of motion.     Cervical back: Normal range of motion and neck supple.     Right lower leg: No edema.     Left lower leg: No edema.  Lymphadenopathy:     Head:     Right side of head: No preauricular, posterior auricular or occipital adenopathy.     Left side of head: No preauricular, posterior auricular or occipital adenopathy.     Cervical: No cervical adenopathy.     Upper Body:     Right upper body: No supraclavicular or axillary adenopathy.     Left upper body: No supraclavicular or axillary adenopathy.     Lower Body: No right inguinal adenopathy. No left inguinal adenopathy.  Skin:    General: Skin is warm and dry.     Coloration: Skin is not pale.     Findings: No erythema or rash.  Neurological:     Mental Status: She is alert and oriented to person, place, and time.  Psychiatric:  Behavior: Behavior normal.        Thought Content: Thought content normal.        Judgment: Judgment normal.    Appointment on 04/21/2020  Component Date Value Ref Range  Status  . Sodium 04/21/2020 140  135 - 145 mmol/L Final  . Potassium 04/21/2020 3.9  3.5 - 5.1 mmol/L Final  . Chloride 04/21/2020 104  98 - 111 mmol/L Final  . CO2 04/21/2020 28  22 - 32 mmol/L Final  . Glucose, Bld 04/21/2020 106* 70 - 99 mg/dL Final   Glucose reference range applies only to samples taken after fasting for at least 8 hours.  . BUN 04/21/2020 11  8 - 23 mg/dL Final  . Creatinine, Ser 04/21/2020 0.60  0.44 - 1.00 mg/dL Final  . Calcium 04/21/2020 9.0  8.9 - 10.3 mg/dL Final  . Total Protein 04/21/2020 5.9* 6.5 - 8.1 g/dL Final  . Albumin 04/21/2020 3.3* 3.5 - 5.0 g/dL Final  . AST 04/21/2020 21  15 - 41 U/L Final  . ALT 04/21/2020 17  0 - 44 U/L Final  . Alkaline Phosphatase 04/21/2020 46  38 - 126 U/L Final  . Total Bilirubin 04/21/2020 0.4  0.3 - 1.2 mg/dL Final  . GFR, Estimated 04/21/2020 >60  >60 mL/min Final   Comment: (NOTE) Calculated using the CKD-EPI Creatinine Equation (2021)   . Anion gap 04/21/2020 8  5 - 15 Final   Performed at [facility], [address] [place], [place] [address]  . WBC 04/21/2020 8.5  4.0 - 10.5 K/uL Final  . RBC 04/21/2020 3.87  3.87 - 5.11 MIL/uL Final  . Hemoglobin 04/21/2020 12.1  12.0 - 15.0 g/dL Final  . HCT 04/21/2020 36.9  36 - 46 % Final  . MCV 04/21/2020 95.3  80.0 - 100.0 fL Final  . MCH 04/21/2020 31.3  26.0 - 34.0 pg Final  . MCHC 04/21/2020 32.8  30.0 - 36.0 g/dL Final  . RDW 04/21/2020 13.2  11.5 - 15.5 % Final  . Platelets 04/21/2020 262  150 - 400 K/uL Final  . nRBC 04/21/2020 0.0  0.0 - 0.2 % Final  . Neutrophils Relative % 04/21/2020 61  % Final  . Neutro Abs 04/21/2020 5.2  1.7 - 7.7 K/uL Final  . Lymphocytes Relative 04/21/2020 29  % Final  . Lymphs Abs 04/21/2020 2.5  0.7 - 4.0 K/uL Final  . Monocytes Relative 04/21/2020 8  % Final  . Monocytes Absolute 04/21/2020 0.7  0.1 - 1.0 K/uL Final  . Eosinophils Relative 04/21/2020 1  % Final  . Eosinophils Absolute 04/21/2020 0.1  0.0 - 0.5 K/uL  Final  . Basophils Relative 04/21/2020 1  % Final  . Basophils Absolute 04/21/2020 0.1  0.0 - 0.1 K/uL Final  . Immature Granulocytes 04/21/2020 0  % Final  . Abs Immature Granulocytes 04/21/2020 0.03  0.00 - 0.07 K/uL Final   Performed at [facility], [address] [place], [place] [address]    Assessment:  [name] [name] is a 86 y.o. female with stage I Her2/neu + breast cancer s/p lumpectomy and sentinel lymph node biopsy on 03/18/2018. Pathologyrevealed a 0.6 cm grade III invasive mammary carcinoma. There was DCIS. There was no lymphovascular invasion. Tumor was ER + (99%), PR + (25%), and Her2/neu + by FISH. Pathologic stagewas T1bN0.  Screening mammogram on 01/17/2018 revealed calcifications and possible mass in the right breast. Diagnostic mammogram and ultrasoundon 01/22/2018 revealed indeterminate group of microcalcifications  over the right upper outer quadrant spanning 4 x 9 x 11 mm. There was a 4 x 7 x 7 mm probable complicated cyst at the 11 o'clock position of the right breast 5 cm from the nipple.  [facility] revealedno evidence of malignancy.  Diagnostic mammogram on 02/09/2020 revealed no evidence of malignancy.  Shereceived12 weeks ofHerceptin and Taxol (05/20/2018 - 08/05/2018).She has continued weekly Herceptin(09/05/2018; 09/26/2018 - 01/08/2019).Herceptin was held on06/04/2020and restarted on 12/18/2018.Herceptin was switched to [place] 01/15/2019 (last02/09/2019).  Shereceivedwhole breastradiationfrom04/11/2018 - 11/05/2018.Tamoxifenbegan 11/20/2018.  CA27.29 has been followed: 10.7 on 10/03/2018,< 3.5 on 10/16/2018, 8.3 on 12/11/2018, < 3.5 on 02/05/2019, 5.1 on 03/05/2019, 10.4 on 06/25/2019, 11.3 on 07/23/2019, 15.1 on 10/21/2019 and 7.4 on 01/21/2020.  Echoat [facility] on 03/12/2018 revealed an EF of 61%. Echo at [facility] on03/02/2019 revealed an LVEF of 50 to 55%. There was  a trivial pericardial effusion waspresent.[place] 11/12/2018 revealed an EFof 45-50%. [place] 12/10/2018 revealed an EFof55-60%.[place] 02/11/2019 and 05/13/2019 revealed an EF of 50-55%.  Echo on 09/02/2019 revealed an EF of 60-65%.   He has a history of stage IIB ovarian cancer s/p exploratory laparotomy, TAH/BSO, omentectomy, peritoneal biopsies, selective pelvic and periaortic lymph node sampling on 06/20/2001. Pathologyrevealed a grade 1 papillary serous carcinoma of the ovary. Uterine serosa and the surface of the contralateral ovary were positive for metastatic disease. The primary tumor was approximately 15 cm in diameter. She received 6 cycles ofcarboplatin and Taxol.CA125 was 12.4 on 04/30/2019.  Bone densityon 04/01/2017 revealed osteoporosiswith a T-score of -2.5 in the left femoral neck.Bone densityon 04/06/2019 revealed osteoporosiswith a T-score of -2.7 inthe left neck femur. She began Proliaon 01/26/2019 (last 01/26/2020).  She has grade I neuropathyin her feet. She has chronic right lower extremity edema. Right lower extremity duplexon 07/22/2018 revealed no femoropopliteal and no calf DVT in the visualized calf veins.  She will receive her second COVID-19 vaccine on 07/07/2019 07/28/2019. She received a third regular dose on 04/15/2020 because she was immunocompromised. She will get her Booster shot in 6 months.  Symptomatically, she feels "ok". The neuropathy in her toes is mild.  She denies any breast concerns.  Exam is stable.  Plan: 1.   Labs today: CBC with diff, CMP, CA 27.29 2.Stage I HER-2/neu (+) breast cancer Clinically,she is doing well.. Exam is unremarkable. Radiation completed on05/27/2020. She completed a year of adjuvantKanjinti (Herceptin biosimilar) She remains on tamoxifen (began 11/20/2018). Bilateral diagnostic mammogram on 02/09/2020 revealed no evidence of disease.  Continue surveillance. 3.Chemotherapy-induced  neuropathy She has a mild neuropathy in her toes. Intervention needed. 4.Osteoporosis Bone density on 04/06/2019 revealedosteoporosiswith a T-score of -2.7inthe left neck femur. Continue calcium and vitamin D. She last received Prolia with Dr. Nicki Reaper on 01/26/2020. 5.RTC in 4 months for MD assessment and labs (CBC with diff, CMP, CA 27.29).  I discussed the assessment and treatment plan with the patient.  The patient was provided an opportunity to ask questions and all were answered.  The patient agreed with the plan and demonstrated an understanding of the instructions.  The patient was advised to call back if the symptoms worsen or if the condition fails to improve as anticipated.   Lequita Asal, MD, PhD    04/21/2020, 10:16 AM  I, Mirian Mo Tufford, am acting as Education administrator for [employer]. Mike Gip, MD, PhD.  I, Bentlie Catanzaro C. Mike Gip, MD, have reviewed the above documentation for accuracy and completeness, and I agree with the above.

## 2020-04-21 ENCOUNTER — Inpatient Hospital Stay: Payer: Medicare HMO

## 2020-04-21 ENCOUNTER — Encounter: Payer: Self-pay | Admitting: Hematology and Oncology

## 2020-04-21 ENCOUNTER — Inpatient Hospital Stay: Payer: Medicare HMO | Attending: Hematology and Oncology | Admitting: Hematology and Oncology

## 2020-04-21 ENCOUNTER — Other Ambulatory Visit: Payer: Self-pay

## 2020-04-21 VITALS — BP 142/56 | HR 74 | Temp 98.5°F | Wt 171.0 lb

## 2020-04-21 DIAGNOSIS — C50411 Malignant neoplasm of upper-outer quadrant of right female breast: Secondary | ICD-10-CM | POA: Diagnosis not present

## 2020-04-21 DIAGNOSIS — Z170 Estrogen receptor positive status [ER+]: Secondary | ICD-10-CM | POA: Diagnosis not present

## 2020-04-21 DIAGNOSIS — M810 Age-related osteoporosis without current pathological fracture: Secondary | ICD-10-CM | POA: Diagnosis not present

## 2020-04-21 DIAGNOSIS — G620 Drug-induced polyneuropathy: Secondary | ICD-10-CM | POA: Diagnosis not present

## 2020-04-21 DIAGNOSIS — T451X5A Adverse effect of antineoplastic and immunosuppressive drugs, initial encounter: Secondary | ICD-10-CM

## 2020-04-21 LAB — CBC WITH DIFFERENTIAL/PLATELET
Abs Immature Granulocytes: 0.03 10*3/uL (ref 0.00–0.07)
Basophils Absolute: 0.1 10*3/uL (ref 0.0–0.1)
Basophils Relative: 1 %
Eosinophils Absolute: 0.1 10*3/uL (ref 0.0–0.5)
Eosinophils Relative: 1 %
HCT: 36.9 % (ref 36.0–46.0)
Hemoglobin: 12.1 g/dL (ref 12.0–15.0)
Immature Granulocytes: 0 %
Lymphocytes Relative: 29 %
Lymphs Abs: 2.5 10*3/uL (ref 0.7–4.0)
MCH: 31.3 pg (ref 26.0–34.0)
MCHC: 32.8 g/dL (ref 30.0–36.0)
MCV: 95.3 fL (ref 80.0–100.0)
Monocytes Absolute: 0.7 10*3/uL (ref 0.1–1.0)
Monocytes Relative: 8 %
Neutro Abs: 5.2 10*3/uL (ref 1.7–7.7)
Neutrophils Relative %: 61 %
Platelets: 262 10*3/uL (ref 150–400)
RBC: 3.87 MIL/uL (ref 3.87–5.11)
RDW: 13.2 % (ref 11.5–15.5)
WBC: 8.5 10*3/uL (ref 4.0–10.5)
nRBC: 0 % (ref 0.0–0.2)

## 2020-04-21 LAB — COMPREHENSIVE METABOLIC PANEL
ALT: 17 U/L (ref 0–44)
AST: 21 U/L (ref 15–41)
Albumin: 3.3 g/dL — ABNORMAL LOW (ref 3.5–5.0)
Alkaline Phosphatase: 46 U/L (ref 38–126)
Anion gap: 8 (ref 5–15)
BUN: 11 mg/dL (ref 8–23)
CO2: 28 mmol/L (ref 22–32)
Calcium: 9 mg/dL (ref 8.9–10.3)
Chloride: 104 mmol/L (ref 98–111)
Creatinine, Ser: 0.6 mg/dL (ref 0.44–1.00)
GFR, Estimated: >60 mL/min (ref >60)
Glucose, Bld: 106 mg/dL — ABNORMAL HIGH (ref 70–99)
Potassium: 3.9 mmol/L (ref 3.5–5.1)
Sodium: 140 mmol/L (ref 135–145)
Total Bilirubin: 0.4 mg/dL (ref 0.3–1.2)
Total Protein: 5.9 g/dL — ABNORMAL LOW (ref 6.5–8.1)

## 2020-04-21 NOTE — Progress Notes (Signed)
No new changes noted today 

## 2020-04-22 LAB — CANCER ANTIGEN 27.29: CA 27.29: 5.6 U/mL (ref 0.0–38.6)

## 2020-06-27 ENCOUNTER — Other Ambulatory Visit: Payer: Self-pay | Admitting: Hematology and Oncology

## 2020-06-27 DIAGNOSIS — C50411 Malignant neoplasm of upper-outer quadrant of right female breast: Secondary | ICD-10-CM

## 2020-06-30 ENCOUNTER — Other Ambulatory Visit: Payer: Self-pay | Admitting: Internal Medicine

## 2020-07-04 ENCOUNTER — Telehealth: Payer: Self-pay | Admitting: Internal Medicine

## 2020-07-04 NOTE — Telephone Encounter (Signed)
Patient insurance has changed ID number reran insurance for Prolia and scheduled visit.

## 2020-07-29 ENCOUNTER — Other Ambulatory Visit: Payer: Self-pay

## 2020-07-29 ENCOUNTER — Ambulatory Visit (INDEPENDENT_AMBULATORY_CARE_PROVIDER_SITE_OTHER): Payer: Medicare HMO | Admitting: Internal Medicine

## 2020-07-29 VITALS — BP 110/64 | HR 78 | Temp 98.2°F | Resp 16 | Ht 63.0 in | Wt 177.0 lb

## 2020-07-29 DIAGNOSIS — M25561 Pain in right knee: Secondary | ICD-10-CM

## 2020-07-29 DIAGNOSIS — R351 Nocturia: Secondary | ICD-10-CM

## 2020-07-29 DIAGNOSIS — Z170 Estrogen receptor positive status [ER+]: Secondary | ICD-10-CM

## 2020-07-29 DIAGNOSIS — M810 Age-related osteoporosis without current pathological fracture: Secondary | ICD-10-CM

## 2020-07-29 DIAGNOSIS — M25562 Pain in left knee: Secondary | ICD-10-CM

## 2020-07-29 DIAGNOSIS — C50411 Malignant neoplasm of upper-outer quadrant of right female breast: Secondary | ICD-10-CM

## 2020-07-29 DIAGNOSIS — R32 Unspecified urinary incontinence: Secondary | ICD-10-CM

## 2020-07-29 DIAGNOSIS — G620 Drug-induced polyneuropathy: Secondary | ICD-10-CM | POA: Diagnosis not present

## 2020-07-29 DIAGNOSIS — E7800 Pure hypercholesterolemia, unspecified: Secondary | ICD-10-CM | POA: Diagnosis not present

## 2020-07-29 DIAGNOSIS — T451X5A Adverse effect of antineoplastic and immunosuppressive drugs, initial encounter: Secondary | ICD-10-CM

## 2020-07-29 LAB — URINALYSIS, ROUTINE W REFLEX MICROSCOPIC
Bilirubin Urine: NEGATIVE
Hgb urine dipstick: NEGATIVE
Ketones, ur: NEGATIVE
Leukocytes,Ua: NEGATIVE
Nitrite: NEGATIVE
RBC / HPF: NONE SEEN (ref 0–?)
Specific Gravity, Urine: 1.01 (ref 1.000–1.030)
Total Protein, Urine: NEGATIVE
Urine Glucose: NEGATIVE
Urobilinogen, UA: 0.2 (ref 0.0–1.0)
WBC, UA: NONE SEEN (ref 0–?)
pH: 6 (ref 5.0–8.0)

## 2020-07-29 MED ORDER — DENOSUMAB 60 MG/ML ~~LOC~~ SOSY
60.0000 mg | PREFILLED_SYRINGE | Freq: Once | SUBCUTANEOUS | Status: AC
  Administered 2020-07-29: 60 mg via SUBCUTANEOUS

## 2020-07-29 NOTE — Progress Notes (Signed)
Patient ID: [name] [name], female   DOB: 1934/03/25, 87 y.o.   MRN: [id number]   Subjective:    Patient ID: [name] [name], female    DOB: 1933/08/24, 87 y.o.   MRN: [id number]  HPI This visit occurred during the SARS-CoV-2 public health emergency.  Safety protocols were in place, including screening questions prior to the visit, additional usage of staff PPE, and extensive cleaning of exam room while observing appropriate contact time as indicated for disinfecting solutions.  Patient here for a scheduled follow up. Here to follow up regarding osteoporosis - prolia injection.  Diagnosed with breast cancer.  Being followed by oncology.  Taking tamoxifen and tolerating.  Breathing stable.  No chest pain.  No nausea or vomiting reported.  No abdominal pain.  Does report nocturia - 3-4x/night.  Discussed incontinence.  Denies increased urination during the day.  Describes leakage.  No dysuria.  No hematuria.  Discussed possible sleep apnea.  Discussed increased nocturia, snoring and not feeling rested when wakes up.  She also reported problems with bilateral knee pain - right > left.  Describes pressure with walking.  Limits activity.    Past Medical History:  Diagnosis Date  . Diverticulitis of sigmoid colon   . Endometriosis   . Family history of breast cancer   . Family history of ovarian cancer   . Family history of prostate cancer   . Family history of uterine cancer   . Fibrocystic breast disease   . Glaucoma   . H/O urinary incontinence   . History of chicken pox   . History of colon polyps 2014  . Hyperlipidemia   . Hypertension   . Ovarian cancer ([place]) 2002   Chemo tx's @ [facility] with Total Hysterectomy. Dr Amalia Hailey  . Personal history of chemotherapy    ovarian cancer  . Personal history of ovarian cancer   . Port-A-Cath in place   . Sinusitis   . Tuberculosis    positve skin test   Past Surgical History:  Procedure Laterality Date  . ABDOMINAL HYSTERECTOMY  2003   complete  Dr Amalia Hailey  . ABSCESS DRAINAGE  2017   right buttock at [facility]  . APPENDECTOMY  2003  . BREAST EXCISIONAL BIOPSY Left 1993   excisional bx. negative results  . BREAST LUMPECTOMY Right 03/18/2018   done at [facility] breast ca rad and chemo Invasive ductal carcinoma with focal squamous differentiation, Nottingham grade 3, is identified forming a 6 mm mass, associated with high-grade ductal carcinoma in situ, radial scar and biopsy site changes.  [facility] Kitchen BREAST SURGERY Left 1982   papilloma removal  . COLONOSCOPY  2006, 2014   Dr Vira Agar   . COLONOSCOPY N/A 03/27/2016   Procedure: COLONOSCOPY;  Surgeon: Robert Bellow, MD;  Location: [facility] ENDOSCOPY;  Service: Endoscopy;  Laterality: N/A;  . COLOSTOMY REVERSAL  2014   at [place] Dr Raynaldo Opitz  . NASAL SINUS SURGERY     For fungal infection  . NASAL SINUS SURGERY  8/05 8/06  . OOPHORECTOMY    . OSTOMY  05/22/2012   diverticulitis with abcess/ Dr Tamala Julian  . ovarian cancer     Exploratory lap  . PORTA CATH INSERTION N/A 05/26/2018   Procedure: PORTA CATH INSERTION;  Surgeon: Algernon Huxley, MD;  Location: [place] [facility];  Service: Cardiovascular;  Laterality: N/A;  . PORTA CATH REMOVAL N/A 11/12/2019   Procedure: PORTA CATH REMOVAL;  Surgeon: Algernon Huxley, MD;  Location: [place] [facility];  Service: Cardiovascular;  Laterality: N/A;  . TONSILLECTOMY AND ADENOIDECTOMY  1947   Family History  Problem Relation Age of Onset  . Hypertension Mother   . Coronary artery disease Mother   . Heart disease Mother   . Kidney disease Mother   . Diabetes Mother   . Cancer Mother        uterine cancer dx 31s  . Diabetes Father   . Kidney failure Father   . Diabetes Brother   . Coronary artery disease Brother   . Cancer Brother        bladder cancer  . Stroke Sister   . Heart disease Sister        [facility]  . Cancer Maternal Aunt 49       ovarian cancer  . Breast cancer Cousin   . Cancer Brother        Prostate   Social History    Socioeconomic History  . Marital status: Widowed    Spouse name: Not on file  . Number of children: 0  . Years of education: Not on file  . Highest education level: Not on file  Occupational History  . Not on file  Tobacco Use  . Smoking status: Never Smoker  . Smokeless tobacco: Never Used  Vaping Use  . Vaping Use: Never used  Substance and Sexual Activity  . Alcohol use: No    Alcohol/week: 0.0 standard drinks  . Drug use: No  . Sexual activity: Not Currently  Other Topics Concern  . Not on file  Social History Narrative   Lives at home alone with dog   Social Determinants of Health   Financial Resource Strain: Not on file  Food Insecurity: Not on file  Transportation Needs: Not on file  Physical Activity: Not on file  Stress: Not on file  Social Connections: Not on file    Outpatient Encounter Medications as of 07/29/2020  Medication Sig  . amLODipine (NORVASC) 2.5 MG tablet TAKE 1 TABLET BY MOUTH EVERY DAY  . Calcium Carbonate-Vitamin D 600-400 MG-UNIT tablet Take 1 tablet by mouth daily.   . Multiple Vitamins-Minerals (MULTIVITAMIN WITH MINERALS) tablet Take 1 tablet by mouth daily.  . tamoxifen (NOLVADEX) 20 MG tablet TAKE 1 TABLET BY MOUTH EVERY DAY  . [DISCONTINUED] prochlorperazine (COMPAZINE) 10 MG tablet Take 1 tablet (10 mg total) by mouth every 6 (six) hours as needed for nausea or vomiting. (Patient not taking: Reported on 01/21/2020)   Facility-Administered Encounter Medications as of 07/29/2020  Medication  . [COMPLETED] denosumab (PROLIA) injection 60 mg  . lidocaine-prilocaine (EMLA) cream  . sodium chloride 0.9 % 50 mL with famotidine (PEPCID) 20 mg infusion  . sodium chloride flush (NS) 0.9 % injection 10 mL    Review of Systems  Constitutional: Negative for appetite change and unexpected weight change.  HENT: Negative for congestion and sinus pressure.   Respiratory: Negative for cough and chest tightness.        Breathing stable.    Cardiovascular: Negative for chest pain and palpitations.       No increased swelling.   Gastrointestinal: Negative for abdominal pain, diarrhea, nausea and vomiting.  Genitourinary: Negative for difficulty urinating and dysuria.       Nocturia  Musculoskeletal: Negative for myalgias.       Bilateral knee pain as outlined.   Skin: Negative for color change and rash.  Neurological: Negative for dizziness, light-headedness and headaches.  Psychiatric/Behavioral: Negative for agitation and dysphoric mood.  Objective:    Physical Exam Vitals reviewed.  Constitutional:      General: She is not in acute distress.    Appearance: Normal appearance.  HENT:     Head: Normocephalic and atraumatic.     Right Ear: External ear normal.     Left Ear: External ear normal.     Mouth/Throat:     Mouth: Oropharynx is clear and moist.  Eyes:     General: No scleral icterus.       Right eye: No discharge.        Left eye: No discharge.     Conjunctiva/sclera: Conjunctivae normal.  Neck:     Thyroid: No thyromegaly.  Cardiovascular:     Rate and Rhythm: Normal rate and regular rhythm.  Pulmonary:     Effort: No respiratory distress.     Breath sounds: Normal breath sounds. No wheezing.  Abdominal:     General: Bowel sounds are normal.     Palpations: Abdomen is soft.     Tenderness: There is no abdominal tenderness.  Musculoskeletal:        General: No swelling, tenderness or edema.     Cervical back: Neck supple. No tenderness.  Lymphadenopathy:     Cervical: No cervical adenopathy.  Skin:    Findings: No erythema or rash.  Neurological:     Mental Status: She is alert.  Psychiatric:        Mood and Affect: Mood normal.        Behavior: Behavior normal.     BP 110/64   Pulse 78   Temp 98.2 F (36.8 C) (Oral)   Resp 16   Ht 5\' 3"  (1.6 m)   Wt 177 lb (80.3 kg)   SpO2 98%   BMI 31.35 kg/m  Wt Readings from Last 3 Encounters:  07/29/20 177 lb (80.3 kg)  04/21/20 170  lb 15.5 oz (77.6 kg)  01/21/20 168 lb 8.7 oz (76.5 kg)     Lab Results  Component Value Date   WBC 8.5 04/21/2020   HGB 12.1 04/21/2020   HCT 36.9 04/21/2020   PLT 262 04/21/2020   GLUCOSE 106 (H) 04/21/2020   CHOL 160 01/21/2018   TRIG 170.0 (H) 01/21/2018   HDL 52.70 01/21/2018   LDLCALC 74 01/21/2018   ALT 17 04/21/2020   AST 21 04/21/2020   NA 140 04/21/2020   K 3.9 04/21/2020   CL 104 04/21/2020   CREATININE 0.60 04/21/2020   BUN 11 04/21/2020   CO2 28 04/21/2020   TSH 1.79 01/21/2018   INR 1.0 05/21/2012    MM DIAG BREAST TOMO BILATERAL  Result Date: 02/09/2020 CLINICAL DATA:  Status post right lumpectomy, chemotherapy and radiation therapy for breast cancer in 2019. EXAM: DIGITAL DIAGNOSTIC BILATERAL MAMMOGRAM WITH TOMO AND CAD COMPARISON:  Previous examinations, the most recent a bilateral diagnostic mammogram at [place] dated 02/06/2019. ACR Breast Density Category c: The breast tissue is heterogeneously dense, which may obscure small masses. FINDINGS: Stable post lumpectomy changes on the right. No interval findings suspicious for malignancy in either breast. Mammographic images were processed with CAD. IMPRESSION: No evidence of malignancy. RECOMMENDATION: Bilateral diagnostic mammogram in 1 year. I have discussed the findings and recommendations with the patient. If applicable, a reminder letter will be sent to the patient regarding the next appointment. BI-RADS CATEGORY  2: Benign. Electronically Signed   By: Claudie Revering M.D.   On: 02/09/2020 12:25  Assessment & Plan:   Problem List Items Addressed This Visit    Carcinoma of upper-outer quadrant of right breast in female, estrogen receptor positive ([place])    On tamoxifen.  Followed by oncology.        Chemotherapy-induced neuropathy ([place])    Documented - oncology.  Stable.        Hypercholesterolemia    Low cholesterol diet and exercise.  Follow lipid panel.        Incontinence of urine    Describes nocturia and urine incontinence.   Discussed evaluation for sleep apnea.  Discussed overactive bladder.  Denies increased urinary frequency during the day.  Described leakage.  Appears to be more related to stress incontinence. Discussed kegel exercises.  Discussed further evaluation.  She is agreeable.  Refer to urology.       Relevant Orders   Ambulatory referral to Urology   Knee pain    Bilateral knee pain - right > left.  Taking tylenol.  Limit antiinflammatories.  Discussed xray.  Hold.  Refer to ortho.        Relevant Orders   Ambulatory referral to Orthopedic Surgery   Nocturia    Nocturia.  Discussed possible sleep apnea given nocturia, snoring and fatigue.  Discussed referral for evaluation of sleep apnea.  Check urinalysis.        Relevant Orders   Urinalysis, Routine w reflex microscopic (Completed)   Ambulatory referral to Pulmonology   Osteoporosis - Primary    Receiving prolia. Injection today.            Einar Pheasant, MD

## 2020-07-30 ENCOUNTER — Encounter: Payer: Self-pay | Admitting: Internal Medicine

## 2020-07-30 DIAGNOSIS — R32 Unspecified urinary incontinence: Secondary | ICD-10-CM | POA: Insufficient documentation

## 2020-07-30 DIAGNOSIS — M25569 Pain in unspecified knee: Secondary | ICD-10-CM | POA: Insufficient documentation

## 2020-07-30 NOTE — Assessment & Plan Note (Signed)
Documented - oncology.  Stable.  

## 2020-07-30 NOTE — Assessment & Plan Note (Signed)
Describes nocturia and urine incontinence.   Discussed evaluation for sleep apnea.  Discussed overactive bladder.  Denies increased urinary frequency during the day.  Described leakage.  Appears to be more related to stress incontinence. Discussed kegel exercises.  Discussed further evaluation.  She is agreeable.  Refer to urology.

## 2020-07-30 NOTE — Assessment & Plan Note (Signed)
On tamoxifen.  Followed by oncology.   

## 2020-07-30 NOTE — Assessment & Plan Note (Signed)
Bilateral knee pain - right > left.  Taking tylenol.  Limit antiinflammatories.  Discussed xray.  Hold.  Refer to ortho.

## 2020-07-30 NOTE — Assessment & Plan Note (Signed)
Low cholesterol diet and exercise.  Follow lipid panel.   

## 2020-07-30 NOTE — Assessment & Plan Note (Signed)
Nocturia.  Discussed possible sleep apnea given nocturia, snoring and fatigue.  Discussed referral for evaluation of sleep apnea.  Check urinalysis.

## 2020-07-30 NOTE — Assessment & Plan Note (Signed)
Receiving prolia. Injection today.

## 2020-08-17 ENCOUNTER — Ambulatory Visit: Payer: Medicare HMO | Admitting: Primary Care

## 2020-08-17 ENCOUNTER — Other Ambulatory Visit: Payer: Self-pay

## 2020-08-17 ENCOUNTER — Encounter: Payer: Self-pay | Admitting: Primary Care

## 2020-08-17 VITALS — BP 138/62 | HR 77 | Temp 97.5°F | Ht 63.0 in | Wt 175.0 lb

## 2020-08-17 DIAGNOSIS — G479 Sleep disorder, unspecified: Secondary | ICD-10-CM

## 2020-08-17 DIAGNOSIS — R0683 Snoring: Secondary | ICD-10-CM | POA: Diagnosis not present

## 2020-08-17 NOTE — Patient Instructions (Signed)
Nice meeting you today [name] [name]  I have a low suspicion you have sleep apnea but we will check sleep study to rule this out as reason you are having disruption in your sleep. Please discuss with urology   Orders: Home sleep study re: snoring/sleep disruption   Follow-up: 6-8 weeks televisit to review sleep study results

## 2020-08-17 NOTE — Progress Notes (Signed)
[facility]  [address], [address] [place], [place] [address] Phone: [phone]  Fax: [phone]   Clinic Day:  08/18/2020  Referring physician: Einar Pheasant, MD  Chief Complaint: [name] [name] is a 87 y.o. female with stage I Her2/neu + breast cancer who is seen for 3 month assessment.  HPI: The patient was last seen in the medical oncology clinic on 04/21/2020. At that time, she felt "ok". The neuropathy in her toes was mild.  She denied any breast concerns.  Exam was stable. Hematocrit was 36.9, hemoglobin 12.1, platelets 262,000, WBC 8,500.  Albumin was 3.3. CA27.29 was 5.6. She continued tamoxifen, calcium, and vitamin D.  During the interim, she has been okay. She has osteoarthritis. Her neuropathy is stable but does not bother her. She is still taking tamoxifen. She does not perform monthly breast exams.  She receives Prolia every 6 months; last was a week ago.   Past Medical History:  Diagnosis Date  . Diverticulitis of sigmoid colon   . Endometriosis   . Family history of breast cancer   . Family history of ovarian cancer   . Family history of prostate cancer   . Family history of uterine cancer   . Fibrocystic breast disease   . Glaucoma   . H/O urinary incontinence   . History of chicken pox   . History of colon polyps 2014  . Hyperlipidemia   . Hypertension   . Ovarian cancer ([place]) 2002   Chemo tx's @ [facility] with Total Hysterectomy. Dr Amalia Hailey  . Personal history of chemotherapy    ovarian cancer  . Personal history of ovarian cancer   . Port-A-Cath in place   . Sinusitis   . Tuberculosis    positve skin test    Past Surgical History:  Procedure Laterality Date  . ABDOMINAL HYSTERECTOMY  2003   complete Dr Amalia Hailey  . ABSCESS DRAINAGE  2017   right buttock at [facility]  . APPENDECTOMY  2003  . BREAST EXCISIONAL BIOPSY Left 1993   excisional bx. negative results  . BREAST LUMPECTOMY Right 03/18/2018   done at [facility]  rad and chemo Invasive ductal carcinoma with focal squamous differentiation, Nottingham grade 3, is identified forming a 6 mm mass, associated with high-grade ductal carcinoma in situ, radial scar and biopsy site changes.  Marland Kitchen BREAST SURGERY Left 1982   papilloma removal  . COLONOSCOPY  2006, 2014   Dr Vira Agar   . COLONOSCOPY N/A 03/27/2016   Procedure: COLONOSCOPY;  Surgeon: Robert Bellow, MD;  Location: [facility] ENDOSCOPY;  Service: Endoscopy;  Laterality: N/A;  . COLOSTOMY REVERSAL  2014   at [facility] Raynaldo Opitz  . NASAL SINUS SURGERY     For fungal infection  . NASAL SINUS SURGERY  8/05 8/06  . OOPHORECTOMY    . OSTOMY  05/22/2012   diverticulitis with abcess/ Dr Tamala Julian  . ovarian cancer     Exploratory lap  . PORTA CATH INSERTION N/A 05/26/2018   Procedure: PORTA CATH INSERTION;  Surgeon: Algernon Huxley, MD;  Location: [facility];  Service: Cardiovascular;  Laterality: N/A;  . PORTA CATH REMOVAL N/A 11/12/2019   Procedure: PORTA CATH REMOVAL;  Surgeon: Algernon Huxley, MD;  Location: [facility];  Service: Cardiovascular;  Laterality: N/A;  . TONSILLECTOMY AND ADENOIDECTOMY  1947    Family History  Problem Relation Age of Onset  . Hypertension Mother   . Coronary artery disease Mother   .  Heart disease Mother   . Kidney disease Mother   . Diabetes Mother   . Cancer Mother        uterine cancer dx 24s  . Diabetes Father   . Kidney failure Father   . Diabetes Brother   . Coronary artery disease Brother   . Cancer Brother        bladder cancer  . Stroke Sister   . Heart disease Sister        [facility]  . Cancer Maternal Aunt 49       ovarian cancer  . Breast cancer Cousin   . Cancer Brother        Prostate    Social History:  reports that she has never smoked. She has never used smokeless tobacco. She reports that she does not drink alcohol and does not use drugs. Patient is a retired Engineer, site. Generally accompanied to appointments by friend,  [name]. She lives in [place]. The patient is alone today.  Allergies: No Known Allergies  Current Medications: Current Outpatient Medications  Medication Sig Dispense Refill  . amLODipine (NORVASC) 2.5 MG tablet TAKE 1 TABLET BY MOUTH EVERY DAY 90 tablet 1  . Calcium Carbonate-Vitamin D 600-400 MG-UNIT tablet Take 1 tablet by mouth daily.     . meloxicam (MOBIC) 7.5 MG tablet Take 7.5 mg by mouth 2 (two) times daily.    . Multiple Vitamins-Minerals (MULTIVITAMIN WITH MINERALS) tablet Take 1 tablet by mouth daily.    . tamoxifen (NOLVADEX) 20 MG tablet TAKE 1 TABLET BY MOUTH EVERY DAY 90 tablet 1   Current Facility-Administered Medications  Medication Dose Route Frequency Provider Last Rate Last Admin  . lidocaine-prilocaine (EMLA) cream   Topical PRN Lucky Cowboy, Erskine Squibb, MD       Facility-Administered Medications Ordered in Other Visits  Medication Dose Route Frequency Provider Last Rate Last Admin  . sodium chloride 0.9 % 50 mL with famotidine (PEPCID) 20 mg infusion  20 mg Intravenous Once Charlaine Dalton R, MD      . sodium chloride flush (NS) 0.9 % injection 10 mL  10 mL Intravenous PRN Cammie Sickle, MD   10 mL at 06/24/18 0854    Review of Systems  Constitutional: Negative for chills, diaphoresis, fever, malaise/fatigue and weight loss (up 3 lbs).  HENT: Negative.  Negative for congestion, ear discharge, ear pain, hearing loss, nosebleeds, sinus pain, sore throat and tinnitus.   Eyes: Negative for blurred vision, double vision and photophobia.       Cataracts.  Respiratory: Negative.  Negative for cough, hemoptysis, sputum production, shortness of breath and wheezing.   Cardiovascular: Negative.  Negative for chest pain, palpitations, leg swelling and PND.  Gastrointestinal: Negative.  Negative for abdominal pain, blood in stool, constipation, diarrhea, heartburn, melena, nausea and vomiting.  Genitourinary: Negative for dysuria, frequency, hematuria and urgency.   Musculoskeletal: Positive for joint pain (osteoarthritis). Negative for back pain, falls, myalgias and neck pain.  Skin: Negative.  Negative for itching and rash.  Neurological: Positive for sensory change (mild numbness in toes). Negative for dizziness, tingling, tremors, speech change, focal weakness, weakness and headaches.  Endo/Heme/Allergies: Negative.  Does not bruise/bleed easily.  Psychiatric/Behavioral: Negative.  Negative for depression and memory loss. The patient is not nervous/anxious and does not have insomnia.   All other systems reviewed and are negative.  Performance status (ECOG): 1  Vitals Blood pressure (!) 141/65, pulse 77, temperature (!) 96.6 F (35.9 C), temperature source Tympanic, weight  173 lb 13.3 oz (78.8 kg), SpO2 97 %.   Physical Exam Vitals and nursing note reviewed.  Constitutional:      General: She is not in acute distress.    Appearance: She is well-developed. She is not diaphoretic.  HENT:     Head: Normocephalic and atraumatic.     Comments: Curly gray hair.    Mouth/Throat:     Mouth: Mucous membranes are moist.     Pharynx: Oropharynx is clear. No oropharyngeal exudate.  Eyes:     General: No scleral icterus.    Extraocular Movements: Extraocular movements intact.     Conjunctiva/sclera: Conjunctivae normal.     Pupils: Pupils are equal, round, and reactive to light.     Comments: Glasses.  Brown eyes.  Neck:     Vascular: No JVD.  Cardiovascular:     Rate and Rhythm: Normal rate and regular rhythm.     Heart sounds: Normal heart sounds. No murmur heard. No friction rub. No gallop.   Pulmonary:     Effort: Pulmonary effort is normal. No respiratory distress.     Breath sounds: Normal breath sounds. No wheezing or rales.  Chest:     Chest wall: No tenderness.  Breasts:     Right: Swelling (edema superiorly), skin change (fibrocystic changes medially and inferiorly) and tenderness (R>L) present. No bleeding, mass, axillary adenopathy  or supraclavicular adenopathy.     Left: Skin change (fibrocystic changes in all quadrants of breast) and tenderness present. No swelling, bleeding, mass, axillary adenopathy or supraclavicular adenopathy.    Abdominal:     General: Bowel sounds are normal. There is no distension.     Palpations: Abdomen is soft. There is no mass.     Tenderness: There is no abdominal tenderness. There is no guarding or rebound.  Musculoskeletal:        General: Tenderness (BLE) present. Normal range of motion.     Cervical back: Normal range of motion and neck supple.     Right lower leg: No edema.     Left lower leg: No edema.  Lymphadenopathy:     Head:     Right side of head: No preauricular, posterior auricular or occipital adenopathy.     Left side of head: No preauricular, posterior auricular or occipital adenopathy.     Cervical: No cervical adenopathy.     Upper Body:     Right upper body: No supraclavicular or axillary adenopathy.     Left upper body: No supraclavicular or axillary adenopathy.     Lower Body: No right inguinal adenopathy. No left inguinal adenopathy.  Skin:    General: Skin is warm and dry.     Coloration: Skin is not pale.     Findings: No erythema or rash.  Neurological:     Mental Status: She is alert and oriented to person, place, and time.  Psychiatric:        Behavior: Behavior normal.        Thought Content: Thought content normal.        Judgment: Judgment normal.    Appointment on 08/18/2020  Component Date Value Ref Range Status  . Sodium 08/18/2020 142  135 - 145 mmol/L Final  . Potassium 08/18/2020 4.4  3.5 - 5.1 mmol/L Final  . Chloride 08/18/2020 106  98 - 111 mmol/L Final  . CO2 08/18/2020 29  22 - 32 mmol/L Final  . Glucose, Bld 08/18/2020 95  70 - 99 mg/dL Final  Glucose reference range applies only to samples taken after fasting for at least 8 hours.  . BUN 08/18/2020 16  8 - 23 mg/dL Final  . Creatinine, Ser 08/18/2020 0.64  0.44 - 1.00 mg/dL  Final  . Calcium 08/18/2020 9.0  8.9 - 10.3 mg/dL Final  . Total Protein 08/18/2020 5.9* 6.5 - 8.1 g/dL Final  . Albumin 08/18/2020 3.4* 3.5 - 5.0 g/dL Final  . AST 08/18/2020 24  15 - 41 U/L Final  . ALT 08/18/2020 21  0 - 44 U/L Final  . Alkaline Phosphatase 08/18/2020 54  38 - 126 U/L Final  . Total Bilirubin 08/18/2020 0.5  0.3 - 1.2 mg/dL Final  . GFR, Estimated 08/18/2020 >60  >60 mL/min Final   Comment: (NOTE) Calculated using the CKD-EPI Creatinine Equation (2021)   . Anion gap 08/18/2020 7  5 - 15 Final   Performed at [facility], [address] [place], [place] [address]  . WBC 08/18/2020 8.1  4.0 - 10.5 K/uL Final  . RBC 08/18/2020 4.04  3.87 - 5.11 MIL/uL Final  . Hemoglobin 08/18/2020 12.3  12.0 - 15.0 g/dL Final  . HCT 08/18/2020 38.3  36.0 - 46.0 % Final  . MCV 08/18/2020 94.8  80.0 - 100.0 fL Final  . MCH 08/18/2020 30.4  26.0 - 34.0 pg Final  . MCHC 08/18/2020 32.1  30.0 - 36.0 g/dL Final  . RDW 08/18/2020 13.5  11.5 - 15.5 % Final  . Platelets 08/18/2020 245  150 - 400 K/uL Final  . nRBC 08/18/2020 0.0  0.0 - 0.2 % Final  . Neutrophils Relative % 08/18/2020 57  % Final  . Neutro Abs 08/18/2020 4.7  1.7 - 7.7 K/uL Final  . Lymphocytes Relative 08/18/2020 32  % Final  . Lymphs Abs 08/18/2020 2.6  0.7 - 4.0 K/uL Final  . Monocytes Relative 08/18/2020 9  % Final  . Monocytes Absolute 08/18/2020 0.7  0.1 - 1.0 K/uL Final  . Eosinophils Relative 08/18/2020 1  % Final  . Eosinophils Absolute 08/18/2020 0.0  0.0 - 0.5 K/uL Final  . Basophils Relative 08/18/2020 1  % Final  . Basophils Absolute 08/18/2020 0.1  0.0 - 0.1 K/uL Final  . Immature Granulocytes 08/18/2020 0  % Final  . Abs Immature Granulocytes 08/18/2020 0.03  0.00 - 0.07 K/uL Final   Performed at [facility], [address] [place], [place] [address]    Assessment:  [name] [name] is a 87 y.o. female with stage I Her2/neu + breast cancer s/p lumpectomy and sentinel lymph  node biopsy on 03/18/2018. Pathologyrevealed a 0.6 cm grade III invasive mammary carcinoma. There was DCIS. There was no lymphovascular invasion. Tumor was ER + (99%), PR + (25%), and Her2/neu + by FISH. Pathologic stagewas T1bN0.  Screening mammogram on 01/17/2018 revealed calcifications and possible mass in the right breast. Diagnostic mammogram and ultrasoundon 01/22/2018 revealed indeterminate group of microcalcifications over the right upper outer quadrant spanning 4 x 9 x 11 mm. There was a 4 x 7 x 7 mm probable complicated cyst at the 11 o'clock position of the right breast 5 cm from the nipple.  [facility] revealedno evidence of malignancy.  Diagnostic mammogram on 02/09/2020 revealed no evidence of malignancy.  Shereceived12 weeks ofHerceptin and Taxol (05/20/2018 - 08/05/2018).She has continued weekly Herceptin(09/05/2018; 09/26/2018 - 01/08/2019).Herceptin was held on06/04/2020and restarted on 12/18/2018.Herceptin was switched to [place] 01/15/2019 (last02/09/2019).  Shereceivedwhole breastradiationfrom04/11/2018 - 11/05/2018.Tamoxifenbegan 11/20/2018.  CA27.29 has  been followed: 10.7 on 10/03/2018,< 3.5 on 10/16/2018, 8.3 on 12/11/2018, < 3.5 on 02/05/2019, 5.1 on 03/05/2019, 10.4 on 06/25/2019, 11.3 on 07/23/2019, 15.1 on 10/21/2019 and 7.4 on 01/21/2020.  Echoat [facility] on 03/12/2018 revealed an EF of 61%. [facility] at [facility] on03[facility]02[facility]2019 revealed an LVEF of 50 to 55%. There was a trivial pericardial effusion waspresent.[place] 11/12/2018 revealed an EFof 45-50%. [place] 12/10/2018 revealed an EFof55-60%.[place] 02/11/2019 and 05/13/2019 revealed an EF of 50-55%.  Echo on 09/02/2019 revealed an EF of 60-65%.   He has a history of stage IIB ovarian cancer s/p exploratory laparotomy, TAH/BSO, omentectomy, peritoneal biopsies, selective pelvic and periaortic lymph node sampling on 06/20/2001.  Pathologyrevealed a grade 1 papillary serous carcinoma of the ovary. Uterine serosa and the surface of the contralateral ovary were positive for metastatic disease. The primary tumor was approximately 15 cm in diameter. She received 6 cycles ofcarboplatin and Taxol.CA125 was 12.4 on 04/30/2019.  Bone densityon 04/01/2017 revealed osteoporosiswith a T-score of -2.5 in the left femoral neck.Bone densityon 04/06/2019 revealed osteoporosiswith a T-score of -2.7 inthe left neck femur. She began Proliaon 01/26/2019 (last 07/29/2020).  She has grade I neuropathyin her feet. She has chronic right lower extremity edema. Right lower extremity duplexon 07/22/2018 revealed no femoropopliteal and no calf DVT in the visualized calf veins.  She will receive her second COVID-19 vaccine on 07/07/2019 07/28/2019. She received a third regular dose on 04/15/2020 because she was immunocompromised. She will get her Booster shot in 6 months.  Symptomatically, she feels "ok".  She does not perform monthly breast exams.  Neuropathy is stable. She is taking tamoxifen.  Exam is stable.  Plan: 1.   Labs today: CBC with diff, CMP, CA 27.29. 2.Stage I HER-2/neu (+) breast cancer Clinically,she is doing well. Exam is unremarkable. Radiation completed on05/27/2020. She completed a year of adjuvantKanjinti (Herceptin biosimilar) She remains on tamoxifen (began 11/20/2018). Bilateral diagnostic mammogram on 02/09/2020 revealed no evidence of disease.  Continue surveillance. 3.Chemotherapy-induced neuropathy She has a mild neuropathy in her toes. Neuropathy is mild. 4.Osteoporosis Bone density on 04/06/2019 revealedosteoporosiswith a T-score of -2.7inthe left neck femur. Continue calcium and vitamin D. She last received Prolia with Dr. Nicki Reaper on 07/29/2020. 5.   Mammogram 02/08/2021. 6.   RTC after mammogram for MD assessment and labs (CBC with diff, CMP, CA 27.29).  I discussed  the assessment and treatment plan with the patient.  The patient was provided an opportunity to ask questions and all were answered.  The patient agreed with the plan and demonstrated an understanding of the instructions.  The patient was advised to call back if the symptoms worsen or if the condition fails to improve as anticipated.   Lequita Asal, MD, PhD    08/18/2020, 2:22 PM  I, Mirian Mo Tufford, am acting as Education administrator for [employer]. Mike Gip, MD, PhD.  I, Pharrell Ledford C. Mike Gip, MD, have reviewed the above documentation for accuracy and completeness, and I agree with the above.

## 2020-08-17 NOTE — Progress Notes (Signed)
@Patient  ID: [name] [name], female    DOB: March 11, 1934, 87 y.o.   MRN: [id number]  Chief Complaint  Patient presents with  . Sleep consult    Per Dr. Amanda Pea prior sleep study. Wakes 3-4 times during the night.     Referring provider: Einar Pheasant, MD  HPI: 87 year old, never smoker. PMH significant for elevated BP, breast cancer, decreased cardiac ejection fraction, diverticulitis, hypercholesterolemia, ovarian cancer. Patient referred to [facility] for sleep consult by PCP.   08/17/2020  Patient presents today for sleep consult. She wakes up multiple times at night to use the restroom. This bothers her and has significantly impact her sleep quality. She has knee pain which makes it hard for her to get out of bed multiple times at night.  She goes to bed 8pm and get up at 5am. She feels rested in the morning. Unsure if she snores but has been told that she does before. She drinks 1/2-1 glass of fluid at night d/t dry mouth. Her weight is up 5 lbs.   Sleep questionnaire: Symptoms: Loud snoring and sleep disruption Prior sleep study: None Bed time: 8pm Time to fall asleep: <5 min Nocturnal awakenings: 3-4 times Out of bed in the morning: 5am  Weight: up 5 lbs Epworth: 6/24     No Known Allergies  Immunization History  Administered Date(s) Administered  . PFIZER(Purple Top)SARS-COV-2 Vaccination 07/07/2019, 07/28/2019    Past Medical History:  Diagnosis Date  . Diverticulitis of sigmoid colon   . Endometriosis   . Family history of breast cancer   . Family history of ovarian cancer   . Family history of prostate cancer   . Family history of uterine cancer   . Fibrocystic breast disease   . Glaucoma   . H/O urinary incontinence   . History of chicken pox   . History of colon polyps 2014  . Hyperlipidemia   . Hypertension   . Ovarian cancer ([place]) 2002   Chemo tx's @ [facility] with Total Hysterectomy. Dr Amalia Hailey  . Personal history of chemotherapy    ovarian cancer   . Personal history of ovarian cancer   . Port-A-Cath in place   . Sinusitis   . Tuberculosis    positve skin test    Tobacco History: Social History   Tobacco Use  Smoking Status Never Smoker  Smokeless Tobacco Never Used   Counseling given: Not Answered   Outpatient Medications Prior to Visit  Medication Sig Dispense Refill  . amLODipine (NORVASC) 2.5 MG tablet TAKE 1 TABLET BY MOUTH EVERY DAY 90 tablet 1  . Calcium Carbonate-Vitamin D 600-400 MG-UNIT tablet Take 1 tablet by mouth daily.     . meloxicam (MOBIC) 7.5 MG tablet Take 7.5 mg by mouth 2 (two) times daily. (Patient not taking: Reported on 09/05/2020)    . Multiple Vitamins-Minerals (MULTIVITAMIN WITH MINERALS) tablet Take 1 tablet by mouth daily.    . tamoxifen (NOLVADEX) 20 MG tablet TAKE 1 TABLET BY MOUTH EVERY DAY 90 tablet 1   Facility-Administered Medications Prior to Visit  Medication Dose Route Frequency Provider Last Rate Last Admin  . lidocaine-prilocaine (EMLA) cream   Topical PRN Lucky Cowboy, Erskine Squibb, MD      . sodium chloride 0.9 % 50 mL with famotidine (PEPCID) 20 mg infusion  20 mg Intravenous Once Charlaine Dalton R, MD      . sodium chloride flush (NS) 0.9 % injection 10 mL  10 mL Intravenous PRN Cammie Sickle, MD  10 mL at 06/24/18 0854    Review of Systems  Review of Systems  Constitutional: Positive for fatigue.  HENT: Negative.   Respiratory: Negative.   Cardiovascular: Negative.    Physical Exam  BP 138/62 (BP Location: Left Arm, Cuff Size: Normal)   Pulse 77   Temp (!) 97.5 F (36.4 C) (Temporal)   Ht 5\' 3"  (1.6 m)   Wt 175 lb (79.4 kg)   SpO2 97%   BMI 31.00 kg/m  Physical Exam Constitutional:      Appearance: Normal appearance.  HENT:     Head: Normocephalic and atraumatic.     Mouth/Throat:     Mouth: Mucous membranes are moist.     Pharynx: Oropharynx is clear.     Comments: Mallampati class I (tonsils absent) Cardiovascular:     Rate and Rhythm: Normal rate and  regular rhythm.  Pulmonary:     Effort: Pulmonary effort is normal.     Breath sounds: Normal breath sounds.  Neurological:     General: No focal deficit present.     Mental Status: She is alert and oriented to person, place, and time. Mental status is at baseline.  Psychiatric:        Mood and Affect: Mood normal.        Behavior: Behavior normal.        Thought Content: Thought content normal.        Judgment: Judgment normal.      Lab Results:  CBC    Component Value Date/Time   WBC 8.1 08/18/2020 1330   RBC 4.04 08/18/2020 1330   HGB 12.3 08/18/2020 1330   HGB 12.5 12/11/2012 0406   HCT 38.3 08/18/2020 1330   HCT 35.9 12/11/2012 0406   PLT 245 08/18/2020 1330   PLT 250 12/11/2012 0406   MCV 94.8 08/18/2020 1330   MCV 90 12/11/2012 0406   MCH 30.4 08/18/2020 1330   MCHC 32.1 08/18/2020 1330   RDW 13.5 08/18/2020 1330   RDW 13.9 12/11/2012 0406   LYMPHSABS 2.6 08/18/2020 1330   LYMPHSABS 2.4 12/11/2012 0406   MONOABS 0.7 08/18/2020 1330   MONOABS 1.0 (H) 12/11/2012 0406   EOSABS 0.0 08/18/2020 1330   EOSABS 0.1 12/11/2012 0406   BASOSABS 0.1 08/18/2020 1330   BASOSABS 0.0 12/11/2012 0406    BMET    Component Value Date/Time   NA 142 08/18/2020 1330   NA 142 12/11/2012 0406   K 4.4 08/18/2020 1330   K 3.3 (L) 12/11/2012 0406   CL 106 08/18/2020 1330   CL 107 12/11/2012 0406   CO2 29 08/18/2020 1330   CO2 28 12/11/2012 0406   GLUCOSE 95 08/18/2020 1330   GLUCOSE 102 (H) 12/11/2012 0406   BUN 16 08/18/2020 1330   BUN 5 (L) 12/11/2012 0406   CREATININE 0.64 08/18/2020 1330   CREATININE 0.59 (L) 12/11/2012 0406   CALCIUM 9.0 08/18/2020 1330   CALCIUM 8.8 12/11/2012 0406   GFRNONAA >60 08/18/2020 1330   GFRNONAA >60 12/11/2012 0406   GFRAA >60 01/21/2020 1002   GFRAA >60 12/11/2012 0406    BNP No results found for: BNP  ProBNP No results found for: PROBNP  Imaging: No results found.   Assessment & Plan:   Sleep disturbance - Patient  reports symptoms of loud snoring and sleep disruption. She wakes up on average 3-4 times a night. Epworth 6/24. We will get HST to rule out OSA as cause of sleep interruption. Ask that she follows up  with urology as well. Discussed causes and risk of untreated sleep apnea. We also briefly reviewed treatments options. FU in 4-6 weeks to review sleep study results.    Martyn Ehrich, NP 09/27/2020

## 2020-08-18 ENCOUNTER — Encounter: Payer: Self-pay | Admitting: Hematology and Oncology

## 2020-08-18 ENCOUNTER — Inpatient Hospital Stay: Payer: Medicare HMO | Attending: Hematology and Oncology | Admitting: Hematology and Oncology

## 2020-08-18 ENCOUNTER — Inpatient Hospital Stay: Payer: Medicare HMO

## 2020-08-18 VITALS — BP 141/65 | HR 77 | Temp 96.6°F | Wt 173.8 lb

## 2020-08-18 DIAGNOSIS — G620 Drug-induced polyneuropathy: Secondary | ICD-10-CM

## 2020-08-18 DIAGNOSIS — Z170 Estrogen receptor positive status [ER+]: Secondary | ICD-10-CM | POA: Diagnosis not present

## 2020-08-18 DIAGNOSIS — C50919 Malignant neoplasm of unspecified site of unspecified female breast: Secondary | ICD-10-CM | POA: Diagnosis present

## 2020-08-18 DIAGNOSIS — Z79810 Long term (current) use of selective estrogen receptor modulators (SERMs): Secondary | ICD-10-CM | POA: Diagnosis not present

## 2020-08-18 DIAGNOSIS — Z8543 Personal history of malignant neoplasm of ovary: Secondary | ICD-10-CM | POA: Insufficient documentation

## 2020-08-18 DIAGNOSIS — C50411 Malignant neoplasm of upper-outer quadrant of right female breast: Secondary | ICD-10-CM

## 2020-08-18 DIAGNOSIS — R600 Localized edema: Secondary | ICD-10-CM | POA: Diagnosis not present

## 2020-08-18 DIAGNOSIS — Z90722 Acquired absence of ovaries, bilateral: Secondary | ICD-10-CM | POA: Insufficient documentation

## 2020-08-18 DIAGNOSIS — M810 Age-related osteoporosis without current pathological fracture: Secondary | ICD-10-CM | POA: Insufficient documentation

## 2020-08-18 DIAGNOSIS — Z90710 Acquired absence of both cervix and uterus: Secondary | ICD-10-CM | POA: Diagnosis not present

## 2020-08-18 DIAGNOSIS — T451X5A Adverse effect of antineoplastic and immunosuppressive drugs, initial encounter: Secondary | ICD-10-CM

## 2020-08-18 LAB — CBC WITH DIFFERENTIAL/PLATELET
Abs Immature Granulocytes: 0.03 10*3/uL (ref 0.00–0.07)
Basophils Absolute: 0.1 10*3/uL (ref 0.0–0.1)
Basophils Relative: 1 %
Eosinophils Absolute: 0 10*3/uL (ref 0.0–0.5)
Eosinophils Relative: 1 %
HCT: 38.3 % (ref 36.0–46.0)
Hemoglobin: 12.3 g/dL (ref 12.0–15.0)
Immature Granulocytes: 0 %
Lymphocytes Relative: 32 %
Lymphs Abs: 2.6 10*3/uL (ref 0.7–4.0)
MCH: 30.4 pg (ref 26.0–34.0)
MCHC: 32.1 g/dL (ref 30.0–36.0)
MCV: 94.8 fL (ref 80.0–100.0)
Monocytes Absolute: 0.7 10*3/uL (ref 0.1–1.0)
Monocytes Relative: 9 %
Neutro Abs: 4.7 10*3/uL (ref 1.7–7.7)
Neutrophils Relative %: 57 %
Platelets: 245 10*3/uL (ref 150–400)
RBC: 4.04 MIL/uL (ref 3.87–5.11)
RDW: 13.5 % (ref 11.5–15.5)
WBC: 8.1 10*3/uL (ref 4.0–10.5)
nRBC: 0 % (ref 0.0–0.2)

## 2020-08-18 LAB — COMPREHENSIVE METABOLIC PANEL
ALT: 21 U/L (ref 0–44)
AST: 24 U/L (ref 15–41)
Albumin: 3.4 g/dL — ABNORMAL LOW (ref 3.5–5.0)
Alkaline Phosphatase: 54 U/L (ref 38–126)
Anion gap: 7 (ref 5–15)
BUN: 16 mg/dL (ref 8–23)
CO2: 29 mmol/L (ref 22–32)
Calcium: 9 mg/dL (ref 8.9–10.3)
Chloride: 106 mmol/L (ref 98–111)
Creatinine, Ser: 0.64 mg/dL (ref 0.44–1.00)
GFR, Estimated: >60 mL/min (ref >60)
Glucose, Bld: 95 mg/dL (ref 70–99)
Potassium: 4.4 mmol/L (ref 3.5–5.1)
Sodium: 142 mmol/L (ref 135–145)
Total Bilirubin: 0.5 mg/dL (ref 0.3–1.2)
Total Protein: 5.9 g/dL — ABNORMAL LOW (ref 6.5–8.1)

## 2020-08-20 LAB — CANCER ANTIGEN 27.29: CA 27.29: 7.1 U/mL (ref 0.0–38.6)

## 2020-08-22 ENCOUNTER — Other Ambulatory Visit: Payer: Self-pay | Admitting: *Deleted

## 2020-08-22 DIAGNOSIS — C50411 Malignant neoplasm of upper-outer quadrant of right female breast: Secondary | ICD-10-CM

## 2020-08-22 DIAGNOSIS — Z170 Estrogen receptor positive status [ER+]: Secondary | ICD-10-CM

## 2020-08-22 MED ORDER — TAMOXIFEN CITRATE 20 MG PO TABS: 20.0000 mg | ORAL_TABLET | Freq: Every day | ORAL | 1 refills | Status: DC

## 2020-09-05 ENCOUNTER — Encounter: Payer: Self-pay | Admitting: Urology

## 2020-09-05 ENCOUNTER — Ambulatory Visit: Payer: Medicare HMO | Admitting: Urology

## 2020-09-05 ENCOUNTER — Other Ambulatory Visit: Payer: Self-pay

## 2020-09-05 VITALS — BP 160/74 | HR 77 | Ht 63.0 in | Wt 174.0 lb

## 2020-09-05 DIAGNOSIS — R32 Unspecified urinary incontinence: Secondary | ICD-10-CM | POA: Diagnosis not present

## 2020-09-05 DIAGNOSIS — N3946 Mixed incontinence: Secondary | ICD-10-CM | POA: Diagnosis not present

## 2020-09-05 MED ORDER — MIRABEGRON ER 50 MG PO TB24: 50.0000 mg | ORAL_TABLET | Freq: Every day | ORAL | 11 refills | Status: DC

## 2020-09-05 NOTE — Addendum Note (Signed)
Addended by: Verlene Mayer A on: 09/05/2020 11:36 AM   Modules accepted: Orders

## 2020-09-05 NOTE — Progress Notes (Signed)
09/05/2020 11:05 AM   [name] [name] 10-09-1933 [id number]  Referring provider: Einar Pheasant, [address] [place],  [place] [address]  Chief Complaint  Patient presents with  . Urinary Incontinence    HPI: I was consulted to assess the patient's urinary continence worsening over 20 years.  She is not a good historian.  I think she primarily has urge incontinence with foot on the floor syndrome.  I think she also has bedwetting.  She wears 2-3 pads during the day but also 2 or 3 pads at night  She voids every 1-2 hours during the day.  She gets up 3-4 times at night.  She has had a hysterectomy  No history of kidney stones bladder surgery bladder infections.  No previous treatment.  No neurologic issues     PMH: Past Medical History:  Diagnosis Date  . Diverticulitis of sigmoid colon   . Endometriosis   . Family history of breast cancer   . Family history of ovarian cancer   . Family history of prostate cancer   . Family history of uterine cancer   . Fibrocystic breast disease   . Glaucoma   . H/O urinary incontinence   . History of chicken pox   . History of colon polyps 2014  . Hyperlipidemia   . Hypertension   . Ovarian cancer ([place]) 2002   Chemo tx's @ [facility] with Total Hysterectomy. Dr Amalia Hailey  . Personal history of chemotherapy    ovarian cancer  . Personal history of ovarian cancer   . Port-A-Cath in place   . Sinusitis   . Tuberculosis    positve skin test    Surgical History: Past Surgical History:  Procedure Laterality Date  . ABDOMINAL HYSTERECTOMY  2003   complete Dr Amalia Hailey  . ABSCESS DRAINAGE  2017   right buttock at [facility]  . APPENDECTOMY  2003  . BREAST EXCISIONAL BIOPSY Left 1993   excisional bx. negative results  . BREAST LUMPECTOMY Right 03/18/2018   done at [facility] breast ca rad and chemo Invasive ductal carcinoma with focal squamous differentiation, Nottingham grade 3, is identified forming a 6 mm mass, associated  with high-grade ductal carcinoma in situ, radial scar and biopsy site changes.  [facility] BREAST SURGERY Left 1982   papilloma removal  . COLONOSCOPY  2006, 2014   Dr Vira Agar   . COLONOSCOPY N/A 03/27/2016   Procedure: COLONOSCOPY;  Surgeon: Robert Bellow, MD;  Location: [facility] ENDOSCOPY;  Service: Endoscopy;  Laterality: N/A;  . COLOSTOMY REVERSAL  2014   at [facility] Dr Raynaldo Opitz  . NASAL SINUS SURGERY     For fungal infection  . NASAL SINUS SURGERY  8/05 8/06  . OOPHORECTOMY    . OSTOMY  05/22/2012   diverticulitis with abcess/ Dr Tamala Julian  . ovarian cancer     Exploratory lap  . PORTA CATH INSERTION N/A 05/26/2018   Procedure: PORTA CATH INSERTION;  Surgeon: Algernon Huxley, MD;  Location: [place] CV LAB;  Service: Cardiovascular;  Laterality: N/A;  . PORTA CATH REMOVAL N/A 11/12/2019   Procedure: PORTA CATH REMOVAL;  Surgeon: Algernon Huxley, MD;  Location: [place] CV LAB;  Service: Cardiovascular;  Laterality: N/A;  . TONSILLECTOMY AND ADENOIDECTOMY  1947    Home Medications:  Allergies as of 09/05/2020   No Known Allergies     Medication List       Accurate as of September 05, 2020 11:05 AM. If you have any  questions, ask your nurse or doctor.        amLODipine 2.5 MG tablet Commonly known as: NORVASC TAKE 1 TABLET BY MOUTH EVERY DAY   Calcium Carbonate-Vitamin D 600-400 MG-UNIT tablet Take 1 tablet by mouth daily.   meloxicam 7.5 MG tablet Commonly known as: MOBIC Take 7.5 mg by mouth 2 (two) times daily.   multivitamin with minerals tablet Take 1 tablet by mouth daily.   tamoxifen 20 MG tablet Commonly known as: NOLVADEX Take 1 tablet (20 mg total) by mouth daily.       Allergies: No Known Allergies  Family History: Family History  Problem Relation Age of Onset  . Hypertension Mother   . Coronary artery disease Mother   . Heart disease Mother   . Kidney disease Mother   . Diabetes Mother   . Cancer Mother        uterine cancer dx 40s  . Diabetes  Father   . Kidney failure Father   . Diabetes Brother   . Coronary artery disease Brother   . Cancer Brother        bladder cancer  . Stroke Sister   . Heart disease Sister        [facility] Fall  . Cancer Maternal Aunt 49       ovarian cancer  . Breast cancer Cousin   . Cancer Brother        Prostate    Social History:  reports that she has never smoked. She has never used smokeless tobacco. She reports that she does not drink alcohol and does not use drugs.  ROS:                                        Physical Exam: BP (!) 160/74   Pulse 77   Ht 5\' 3"  (1.6 m)   Wt 78.9 kg   BMI 30.82 kg/m   Constitutional:  Alert and oriented, No acute distress. HEENT: [place] AT, moist mucus membranes.  Trachea midline, no masses. Cardiovascular: No clubbing, cyanosis, or edema. Respiratory: Normal respiratory effort, no increased work of breathing. GI: Abdomen is soft, nontender, nondistended, no abdominal masses GU: No CVA tenderness.  Narrow introitus.  No prolapse or stress incontinence Skin: No rashes, bruises or suspicious lesions. Lymph: No cervical or inguinal adenopathy. Neurologic: Grossly intact, no focal deficits, moving all 4 extremities. Psychiatric: Normal mood and affect.  Laboratory Data: Lab Results  Component Value Date   WBC 8.1 08/18/2020   HGB 12.3 08/18/2020   HCT 38.3 08/18/2020   MCV 94.8 08/18/2020   PLT 245 08/18/2020    Lab Results  Component Value Date   CREATININE 0.64 08/18/2020    No results found for: PSA  No results found for: TESTOSTERONE  No results found for: HGBA1C  Urinalysis    Component Value Date/Time   COLORURINE YELLOW 07/29/2020 1421   APPEARANCEUR CLEAR 07/29/2020 1421   APPEARANCEUR Cloudy 05/20/2012 1512   LABSPEC 1.010 07/29/2020 1421   LABSPEC 1.020 05/20/2012 1512   PHURINE 6.0 07/29/2020 1421   GLUCOSEU NEGATIVE 07/29/2020 1421   HGBUR NEGATIVE 07/29/2020 1421   BILIRUBINUR NEGATIVE 07/29/2020  1421   BILIRUBINUR neg 07/15/2014 0853   BILIRUBINUR Negative 05/20/2012 1512   [place] 07/29/2020 1421   PROTEINUR neg 07/15/2014 0853   PROTEINUR Negative 05/20/2012 1512   UROBILINOGEN 0.2 07/29/2020 1421   NITRITE NEGATIVE  07/29/2020 1421   LEUKOCYTESUR NEGATIVE 07/29/2020 1421   LEUKOCYTESUR 3+ 05/20/2012 1512    Pertinent Imaging: Urine reviewed.  Urine sent for culture.  Chart reviewed.   Assessment & Plan: Patient clinically has an overactive bladder.  I will see her back for cystoscopy in 6 weeks on Myrbetriq 50 mg samples and prescription.  I may or may not order urodynamics in the future but will try to treat her without.  Due to osteoarthritis she uses a cane.  Call if culture positive  Reassess in 6 weeks on Myrbetriq 50 mg samples and prescription.  She was reluctant to perform cystoscopy next visit so I held off.  She does not really want to take medication.  We talked about physical therapy.  She will talk to her physical therapist for her knee and see if she does bladder health.  And always refer her to someone else if needed.  Separately she knows there is other treatments but need to failed multiple medications before proceeding  1. Urinary incontinence, unspecified type  - Urinalysis, Complete   No follow-ups on file.  Reece Packer, MD  [place] [address] [place], [place] [address] [phone]

## 2020-09-06 LAB — MICROSCOPIC EXAMINATION
Bacteria, UA: NONE SEEN
RBC, Urine: NONE SEEN /hpf (ref 0–2)

## 2020-09-06 LAB — URINALYSIS, COMPLETE
Bilirubin, UA: NEGATIVE
Glucose, UA: NEGATIVE
Leukocytes,UA: NEGATIVE
Nitrite, UA: NEGATIVE
Protein,UA: NEGATIVE
RBC, UA: NEGATIVE
Specific Gravity, UA: 1.025 (ref 1.005–1.030)
Urobilinogen, Ur: 0.2 mg/dL (ref 0.2–1.0)
pH, UA: 5.5 (ref 5.0–7.5)

## 2020-09-08 LAB — CULTURE, URINE COMPREHENSIVE

## 2020-09-14 ENCOUNTER — Other Ambulatory Visit: Payer: Self-pay

## 2020-09-14 ENCOUNTER — Ambulatory Visit: Payer: Medicare HMO | Admitting: Primary Care

## 2020-09-14 ENCOUNTER — Ambulatory Visit: Payer: Medicare HMO

## 2020-09-14 DIAGNOSIS — R0683 Snoring: Secondary | ICD-10-CM

## 2020-09-14 DIAGNOSIS — G479 Sleep disorder, unspecified: Secondary | ICD-10-CM

## 2020-09-27 DIAGNOSIS — G479 Sleep disorder, unspecified: Secondary | ICD-10-CM | POA: Insufficient documentation

## 2020-09-27 NOTE — Assessment & Plan Note (Signed)
-   Patient reports symptoms of loud snoring and sleep disruption. She wakes up on average 3-4 times a night. Epworth 6/24. We will get HST to rule out OSA as cause of sleep interruption. Ask that she follows up with urology as well. Discussed causes and risk of untreated sleep apnea. We also briefly reviewed treatments options. FU in 4-6 weeks to review sleep study results.

## 2020-10-03 ENCOUNTER — Other Ambulatory Visit: Payer: Self-pay

## 2020-10-03 DIAGNOSIS — G4733 Obstructive sleep apnea (adult) (pediatric): Secondary | ICD-10-CM

## 2020-10-05 DIAGNOSIS — G4733 Obstructive sleep apnea (adult) (pediatric): Secondary | ICD-10-CM

## 2020-10-13 ENCOUNTER — Telehealth: Payer: Self-pay | Admitting: Primary Care

## 2020-10-13 NOTE — Telephone Encounter (Signed)
Phone visit scheduled for 10/19/2020 at 10:30 with Beth. Patient is aware and voiced her understanding.  Nothing further needed.

## 2020-10-13 NOTE — Telephone Encounter (Signed)
Can you set up televisit in the next week to review HST results

## 2020-10-17 ENCOUNTER — Ambulatory Visit: Payer: Self-pay | Admitting: Urology

## 2020-10-18 NOTE — Progress Notes (Signed)
Virtual Visit via Telephone Note  I connected with [name] [name] on 10/18/20 at 10:30 AM EDT by telephone and verified that I am speaking with the correct person using two identifiers.  Location: Patient: Home Provider: Office   I discussed the limitations, risks, security and privacy concerns of performing an evaluation and management service by telephone and the availability of in person appointments. I also discussed with the patient that there may be a patient responsible charge related to this service. The patient expressed understanding and agreed to proceed.   History of Present Illness: 87 year old, never smoker. PMH significant for elevated BP, breast cancer, decreased cardiac ejection fraction, diverticulitis, hypercholesterolemia, ovarian cancer. Patient referred to [facility] for sleep consult by PCP.   Previous [facility] encounter: 08/17/2020  Patient presents today for sleep consult. She wakes up multiple times at night to use the restroom. This bothers her and has significantly impact her sleep quality. She has knee pain which makes it hard for her to get out of bed multiple times at night.  She goes to bed 8pm and get up at 5am. She feels rested in the morning. Unsure if she snores but has been told that she does before. She drinks 1/2-1 glass of fluid at night d/t dry mouth. Her weight is up 5 lbs.   Sleep questionnaire: Symptoms: Loud snoring and sleep disruption Prior sleep study: None Bed time: 8pm Time to fall asleep: <5 min Nocturnal awakenings: 3-4 times Out of bed in the morning: 5am  Weight: up 5 lbs Epworth: 6/24     10/19/2020 - interim hx  Patient contacted today to review recent sleep study results. HST on 10/03/20 showed mild OSA. Home sleep study showed mild OSA. We reviewed treatment options including weight loss, oral appliance, CPAP therapy or referral to ENT for possible surgical options. She has full upper and partial lower dentures so oral  appliance would not likely be an option. She wakes up several times at night 3-4 times at night to use the restroom. She feels rested in the morning and does not snore that she is aware of. She was given Myrbetriq 50mg  samples and prescription by urologist to help with overactive bladder. She was supposed to follow up with urologist in 6 weeks with Cystoscopy but she cancelled this apt as she was hesitant to take medication d/t fear of side effects. She has an apt with her PCP Dr. Nicki Reaper in June and would like to review this medication with her.    Observations/Objective:  Able to speak in full sentences; no overt shortness of breath, wheezing or cough  HST 10/03/20>> AHI 11.8/hr with SpO2 low 86%  Assessment and Plan:  Mild OSA: - HST 10/03/20>> AHI 11.8/hr with SpO2 low 86% - She has very mild OSA symptoms, her only compliant is waking up several times at night to use the restroom. She saw urology and has been started on medication for over active bladder.  - We reviewed treatment options. Oral appliance not an option d.t dentures. I recommend she discuss OAB medication with her PCP and follow up with urology as planned. We can see her back in 3 months and if no improvement in nocturia with Myrbetriq would then recommend trying CPAP therapy.   Follow Up Instructions:   - 3 month follow-up  I discussed the assessment and treatment plan with the patient. The patient was provided an opportunity to ask questions and all were answered. The patient agreed with the  plan and demonstrated an understanding of the instructions.   The patient was advised to call back or seek an in-person evaluation if the symptoms worsen or if the condition fails to improve as anticipated.  I provided 22 minutes of non-face-to-face time during this encounter.   Martyn Ehrich, NP

## 2020-10-19 ENCOUNTER — Other Ambulatory Visit: Payer: Self-pay

## 2020-10-19 ENCOUNTER — Ambulatory Visit (INDEPENDENT_AMBULATORY_CARE_PROVIDER_SITE_OTHER): Payer: Medicare HMO | Admitting: Primary Care

## 2020-10-19 ENCOUNTER — Encounter: Payer: Self-pay | Admitting: Primary Care

## 2020-10-19 DIAGNOSIS — G4730 Sleep apnea, unspecified: Secondary | ICD-10-CM

## 2020-10-19 DIAGNOSIS — R351 Nocturia: Secondary | ICD-10-CM | POA: Diagnosis not present

## 2020-10-19 NOTE — Progress Notes (Signed)
Reviewed and agree with assessment/plan.   Chesley Mires, MD [facility] Pulmonary/Critical Care 10/19/2020, 12:21 PM Pager:  [phone]

## 2020-10-19 NOTE — Patient Instructions (Signed)
Sleep study showed mild sleep apnea  HST 10/03/20>> AHI 11.8/hr with SpO2 low 86%  We reviewed treatment options including weight loss, oral appliance, CPAP therapy or referral to ENT for possible surgical options  Oral appliance not an option due to dentures. Recommend discussion urology medication with PCP and follow up with urology as planned.   Follow-up with our office in 3 months and if no improvement in nocturia with Myrbetriq would then recommend trying CPAP therapy

## 2020-11-23 ENCOUNTER — Ambulatory Visit (INDEPENDENT_AMBULATORY_CARE_PROVIDER_SITE_OTHER): Payer: Medicare HMO

## 2020-11-23 VITALS — Ht 63.0 in | Wt 174.0 lb

## 2020-11-23 DIAGNOSIS — Z0000 Encounter for general adult medical examination without abnormal findings: Secondary | ICD-10-CM | POA: Diagnosis not present

## 2020-11-23 NOTE — Patient Instructions (Signed)
[name] [name] , Thank you for taking time to come for your Medicare Wellness Visit. I appreciate your ongoing commitment to your health goals. Please review the following plan we discussed and let me know if I can assist you in the future.   These are the goals we discussed:  Goals       Patient Stated     Follow up with Primary Care Provider (pt-stated)      As needed Arthritic exercises like stretching more often           This is a list of the screening recommended for you and due dates:  Health Maintenance  Topic Date Due   Zoster (Shingles) Vaccine (1 of 2) 02/23/2021*   Flu Shot  01/09/2021   Mammogram  02/08/2021   COVID-19 Vaccine (5 - Booster for Pfizer series) 03/04/2021   DEXA scan (bone density measurement)  Completed   HPV Vaccine  Aged Out   Tetanus Vaccine  Discontinued   Pneumonia vaccines  Discontinued  *Topic was postponed. The date shown is not the original due date.    Advanced directives: not yet completed  Conditions/risks identified: none new  Follow up in one year for your annual wellness visit    Preventive Care 65 Years and Older, Female Preventive care refers to lifestyle choices and visits with your health care provider that can promote health and wellness. What does preventive care include? A yearly physical exam. This is also called an annual well check. Dental exams once or twice a year. Routine eye exams. Ask your health care provider how often you should have your eyes checked. Personal lifestyle choices, including: Daily care of your teeth and gums. Regular physical activity. Eating a healthy diet. Avoiding tobacco and drug use. Limiting alcohol use. Practicing safe sex. Taking low-dose aspirin every day. Taking vitamin and mineral supplements as recommended by your health care provider. What happens during an annual well check? The services and screenings done by your health care provider during your annual well check will depend  on your age, overall health, lifestyle risk factors, and family history of disease. Counseling  Your health care provider may ask you questions about your: Alcohol use. Tobacco use. Drug use. Emotional well-being. Home and relationship well-being. Sexual activity. Eating habits. History of falls. Memory and ability to understand (cognition). Work and work Statistician. Reproductive health. Screening  You may have the following tests or measurements: Height, weight, and BMI. Blood pressure. Lipid and cholesterol levels. These may be checked every 5 years, or more frequently if you are over 31 years old. Skin check. Lung cancer screening. You may have this screening every year starting at age 24 if you have a 30-pack-year history of smoking and currently smoke or have quit within the past 15 years. Fecal occult blood test (FOBT) of the stool. You may have this test every year starting at age 47. Flexible sigmoidoscopy or colonoscopy. You may have a sigmoidoscopy every 5 years or a colonoscopy every 10 years starting at age 32. Hepatitis C blood test. Hepatitis B blood test. Sexually transmitted disease (STD) testing. Diabetes screening. This is done by checking your blood sugar (glucose) after you have not eaten for a while (fasting). You may have this done every 1-3 years. Bone density scan. This is done to screen for osteoporosis. You may have this done starting at age 74. Mammogram. This may be done every 1-2 years. Talk to your health care provider about how often you should have  regular mammograms. Talk with your health care provider about your test results, treatment options, and if necessary, the need for more tests. Vaccines  Your health care provider may recommend certain vaccines, such as: Influenza vaccine. This is recommended every year. Tetanus, diphtheria, and acellular pertussis (Tdap, Td) vaccine. You may need a Td booster every 10 years. Zoster vaccine. You may need  this after age 31. Pneumococcal 13-valent conjugate (PCV13) vaccine. One dose is recommended after age 69. Pneumococcal polysaccharide (PPSV23) vaccine. One dose is recommended after age 67. Talk to your health care provider about which screenings and vaccines you need and how often you need them. This information is not intended to replace advice given to you by your health care provider. Make sure you discuss any questions you have with your health care provider. Document Released: 06/24/2015 Document Revised: 02/15/2016 Document Reviewed: 03/29/2015 Elsevier Interactive Patient Education  2017 [place] Prevention in the Home Falls can cause injuries. They can happen to people of all ages. There are many things you can do to make your home safe and to help prevent falls. What can I do on the outside of my home? Regularly fix the edges of walkways and driveways and fix any cracks. Remove anything that might make you trip as you walk through a door, such as a raised step or threshold. Trim any bushes or trees on the path to your home. Use bright outdoor lighting. Clear any walking paths of anything that might make someone trip, such as rocks or tools. Regularly check to see if handrails are loose or broken. Make sure that both sides of any steps have handrails. Any raised decks and porches should have guardrails on the edges. Have any leaves, snow, or ice cleared regularly. Use sand or salt on walking paths during winter. Clean up any spills in your garage right away. This includes oil or grease spills. What can I do in the bathroom? Use night lights. Install grab bars by the toilet and in the tub and shower. Do not use towel bars as grab bars. Use non-skid mats or decals in the tub or shower. If you need to sit down in the shower, use a plastic, non-slip stool. Keep the floor dry. Clean up any water that spills on the floor as soon as it happens. Remove soap buildup in the tub  or shower regularly. Attach bath mats securely with double-sided non-slip rug tape. Do not have throw rugs and other things on the floor that can make you trip. What can I do in the bedroom? Use night lights. Make sure that you have a light by your bed that is easy to reach. Do not use any sheets or blankets that are too big for your bed. They should not hang down onto the floor. Have a firm chair that has side arms. You can use this for support while you get dressed. Do not have throw rugs and other things on the floor that can make you trip. What can I do in the kitchen? Clean up any spills right away. Avoid walking on wet floors. Keep items that you use a lot in easy-to-reach places. If you need to reach something above you, use a strong step stool that has a grab bar. Keep electrical cords out of the way. Do not use floor polish or wax that makes floors slippery. If you must use wax, use non-skid floor wax. Do not have throw rugs and other things on the floor that  can make you trip. What can I do with my stairs? Do not leave any items on the stairs. Make sure that there are handrails on both sides of the stairs and use them. Fix handrails that are broken or loose. Make sure that handrails are as long as the stairways. Check any carpeting to make sure that it is firmly attached to the stairs. Fix any carpet that is loose or worn. Avoid having throw rugs at the top or bottom of the stairs. If you do have throw rugs, attach them to the floor with carpet tape. Make sure that you have a light switch at the top of the stairs and the bottom of the stairs. If you do not have them, ask someone to add them for you. What else can I do to help prevent falls? Wear shoes that: Do not have high heels. Have rubber bottoms. Are comfortable and fit you well. Are closed at the toe. Do not wear sandals. If you use a stepladder: Make sure that it is fully opened. Do not climb a closed stepladder. Make  sure that both sides of the stepladder are locked into place. Ask someone to hold it for you, if possible. Clearly mark and make sure that you can see: Any grab bars or handrails. First and last steps. Where the edge of each step is. Use tools that help you move around (mobility aids) if they are needed. These include: Canes. Walkers. Scooters. Crutches. Turn on the lights when you go into a dark area. Replace any light bulbs as soon as they burn out. Set up your furniture so you have a clear path. Avoid moving your furniture around. If any of your floors are uneven, fix them. If there are any pets around you, be aware of where they are. Review your medicines with your doctor. Some medicines can make you feel dizzy. This can increase your chance of falling. Ask your doctor what other things that you can do to help prevent falls. This information is not intended to replace advice given to you by your health care provider. Make sure you discuss any questions you have with your health care provider. Document Released: 03/24/2009 Document Revised: 11/03/2015 Document Reviewed: 07/02/2014 Elsevier Interactive Patient Education  2017 [employer].

## 2020-11-23 NOTE — Progress Notes (Addendum)
Subjective:   [name] [name] is a 87 y.o. female who presents for Medicare Annual (Subsequent) preventive examination.  Review of Systems    No ROS.  Medicare Wellness Virtual Visit.  Visual/audio telehealth visit, UTA vital signs.   See social history for additional risk factors.   Cardiac Risk Factors include: advanced age (>77men, >45 women)     Objective:    Today's Vitals   11/23/20 1319  Weight: 174 lb (78.9 kg)  Height: 5\' 3"  (1.6 m)   Body mass index is 30.82 kg/m.  Advanced Directives 11/23/2020 08/18/2020 04/21/2020 01/21/2020 11/23/2019 11/12/2019 07/22/2019  Does Patient Have a Medical Advance Directive? No No No No No No No  Type of Advance Directive - - - - - - -  Does patient want to make changes to medical advance directive? - - No - Patient declined No - Patient declined - - No - Patient declined  Copy of [place] in Chart? - - No - copy requested No - copy requested - - -  Would patient like information on creating a medical advance directive? No - Patient declined - No - Patient declined No - Patient declined No - Patient declined No - Patient declined -    Current Medications (verified) Outpatient Encounter Medications as of 11/23/2020  Medication Sig   amLODipine (NORVASC) 2.5 MG tablet TAKE 1 TABLET BY MOUTH EVERY DAY   Calcium Carbonate-Vitamin D 600-400 MG-UNIT tablet Take 1 tablet by mouth daily.    Multiple Vitamins-Minerals (MULTIVITAMIN WITH MINERALS) tablet Take 1 tablet by mouth daily.   tamoxifen (NOLVADEX) 20 MG tablet Take 1 tablet (20 mg total) by mouth daily.   Facility-Administered Encounter Medications as of 11/23/2020  Medication   lidocaine-prilocaine (EMLA) cream   sodium chloride 0.9 % 50 mL with famotidine (PEPCID) 20 mg infusion   sodium chloride flush (NS) 0.9 % injection 10 mL    Allergies (verified) Patient has no known allergies.   History: Past Medical History:  Diagnosis Date   Diverticulitis of  sigmoid colon    Endometriosis    Family history of breast cancer    Family history of ovarian cancer    Family history of prostate cancer    Family history of uterine cancer    Fibrocystic breast disease    Glaucoma    H/O urinary incontinence    History of chicken pox    History of colon polyps 2014   Hyperlipidemia    Hypertension    Ovarian cancer ([place]) 2002   Chemo tx's @ [facility] with Total Hysterectomy. Dr Amalia Hailey   Personal history of chemotherapy    ovarian cancer   Personal history of ovarian cancer    Port-A-Cath in place    Sinusitis    Tuberculosis    positve skin test   Past Surgical History:  Procedure Laterality Date   ABDOMINAL HYSTERECTOMY  2003   complete Dr Amalia Hailey   ABSCESS DRAINAGE  2017   right buttock at [place]  2003   BREAST EXCISIONAL BIOPSY Left 1993   excisional bx. negative results   BREAST LUMPECTOMY Right 03/18/2018   done at [facility] breast ca rad and chemo Invasive ductal carcinoma with focal squamous differentiation, Nottingham grade 3, is identified forming a 6 mm mass, associated with high-grade ductal carcinoma in situ, radial scar and biopsy site changes.   BREAST SURGERY Left 1982   papilloma removal   COLONOSCOPY  2006, 2014   Dr Vira Agar  COLONOSCOPY N/A 03/27/2016   Procedure: COLONOSCOPY;  Surgeon: Robert Bellow, MD;  Location: [facility] ENDOSCOPY;  Service: Endoscopy;  Laterality: N/A;   COLOSTOMY REVERSAL  2014   at [place] Dr Raynaldo Opitz   NASAL SINUS SURGERY     For fungal infection   NASAL SINUS SURGERY  8/05 8/06   OOPHORECTOMY     OSTOMY  05/22/2012   diverticulitis with abcess/ Dr Tamala Julian   ovarian cancer     Exploratory lap   PORTA CATH INSERTION N/A 05/26/2018   Procedure: PORTA CATH INSERTION;  Surgeon: Algernon Huxley, MD;  Location: [place] CV LAB;  Service: Cardiovascular;  Laterality: N/A;   PORTA CATH REMOVAL N/A 11/12/2019   Procedure: PORTA CATH REMOVAL;  Surgeon: Algernon Huxley, MD;  Location: [place]  CV LAB;  Service: Cardiovascular;  Laterality: N/A;   TONSILLECTOMY AND ADENOIDECTOMY  1947   Family History  Problem Relation Age of Onset   Hypertension Mother    Coronary artery disease Mother    Heart disease Mother    Kidney disease Mother    Diabetes Mother    Cancer Mother        uterine cancer dx 58s   Diabetes Father    Kidney failure Father    Diabetes Brother    Coronary artery disease Brother    Cancer Brother        bladder cancer   Stroke Sister    Heart disease Sister        Pace Maker   Cancer Maternal Aunt 49       ovarian cancer   Breast cancer Cousin    Cancer Brother        Prostate   Social History   Socioeconomic History   Marital status: Widowed    Spouse name: Not on file   Number of children: 0   Years of education: Not on file   Highest education level: Not on file  Occupational History   Not on file  Tobacco Use   Smoking status: Never   Smokeless tobacco: Never  Vaping Use   Vaping Use: Never used  Substance and Sexual Activity   Alcohol use: No    Alcohol/week: 0.0 standard drinks   Drug use: No   Sexual activity: Not Currently  Other Topics Concern   Not on file  Social History Narrative   Lives at home alone with dog   Social Determinants of Health   Financial Resource Strain: Low Risk    Difficulty of Paying Living Expenses: Not hard at all  Food Insecurity: No Food Insecurity   Worried About Charity fundraiser in the Last Year: Never true   [place] in the Last Year: Never true  Transportation Needs: No Transportation Needs   Lack of Transportation (Medical): No   Lack of Transportation (Non-Medical): No  Physical Activity: Not on file  Stress: No Stress Concern Present   Feeling of Stress : Not at all  Social Connections: Unknown   Frequency of Communication with Friends and Family: More than three times a week   Frequency of Social Gatherings with Friends and Family: Not on file   Attends Religious  Services: Not on file   Active Member of Clubs or Organizations: Not on file   Attends Archivist Meetings: Not on file   Marital Status: Not on file    Tobacco Counseling Counseling given: Not Answered   Clinical Intake:  Pre-visit preparation completed:  Yes        Diabetes: No  How often do you need to have someone help you when you read instructions, pamphlets, or other written materials from your doctor or pharmacy?: 1 - Never   Interpreter Needed?: No      Activities of Daily Living In your present state of health, do you have any difficulty performing the following activities: 11/23/2020  Hearing? N  Vision? N  Difficulty concentrating or making decisions? N  Walking or climbing stairs? N  Dressing or bathing? N  Doing errands, shopping? N  Preparing Food and eating ? N  Using the Toilet? N  In the past six months, have you accidently leaked urine? N  Do you have problems with loss of bowel control? N  Managing your Medications? N  Managing your Finances? N  Housekeeping or managing your Housekeeping? N  Some recent data might be hidden    Patient Care Team: Einar Pheasant, MD as PCP - General (Internal Medicine) Caryl Bis Angela Adam, MD as Consulting Physician (Family Medicine) Christene Lye, MD (General Surgery) Einar Pheasant, MD (Internal Medicine) Bary Castilla Forest Gleason, MD (General Surgery) Lequita Asal, MD as Referring Physician (Hematology and Oncology) Noreene Filbert, MD as Referring Physician (Radiation Oncology) Delana Meyer, Dolores Lory, MD as Consulting Physician (Vascular Surgery)  Indicate any recent Medical Services you may have received from other than [facility] providers in the past year (date may be approximate).     Assessment:   This is a routine wellness examination for [name] [name].  I connected with [name] [name] today by telephone and verified that I am speaking with the correct person using two identifiers. Location patient:  home Location provider: work Persons participating in the virtual visit: patient, Marine scientist.    I discussed the limitations, risks, security and privacy concerns of performing an evaluation and management service by telephone and the availability of in person appointments. The patient expressed understanding and verbally consented to this telephonic visit.    Interactive audio and video telecommunications were attempted between this provider and patient, however failed, due to patient having technical difficulties OR patient did not have access to video capability.  We continued and completed visit with audio only.  Some vital signs may be absent or patient reported.   Hearing/Vision screen Hearing Screening - Comments:: Patient is able to hear conversational tones without difficulty.  No issues reported. Vision Screening - Comments:: Wears corrective lenses Visual acuity not assessed, virtual visit.  They have seen their ophthalmologist in the last 12 months.    Dietary issues and exercise activities discussed:   Healthy diet Good water intake   Goals Addressed               This Visit's Progress     Patient Stated     Follow up with Primary Care Provider (pt-stated)        As needed Arthritic exercises like stretching more often          Depression Screen PHQ 2/9 Scores 11/23/2020 11/23/2019 11/19/2018 08/29/2017 03/06/2017 03/06/2017 02/22/2016  PHQ - 2 Score 0 0 0 0 0 0 0  PHQ- 9 Score - - - - 4 - -    Fall Risk Fall Risk  11/23/2020 11/23/2019 11/19/2018 08/29/2017 03/06/2017  Falls in the past year? 0 0 0 No No  Number falls in past yr: 0 0 - - -  Injury with Fall? 0 - - - -  Follow up Falls evaluation completed Falls evaluation completed - - -  FALL RISK PREVENTION PERTAINING TO THE HOME: Handrails in use when climbing stairs? Yes Home free of loose throw rugs in walkways, pet beds, electrical cords, etc? No  Adequate lighting in your home to reduce risk of falls? No    ASSISTIVE DEVICES UTILIZED TO PREVENT FALLS: Life alert? No  Use of a cane, walker or w/c? Yes , cane as needed Grab bars in the bathroom? Yes   TIMED UP AND GO: Was the test performed? No . Virtual visit  Cognitive Function: Patient is alert and oriented x3.  Denies difficulty focusing, making decisions, memory loss.  Enjoys brain health exercises like reading.   MMSE - Mini Mental State Exam 08/29/2017 02/08/2016  Orientation to time 5 5  Orientation to Place 5 5  Registration 3 3  Attention/ Calculation 5 5  Recall 1 3  Language- name 2 objects 2 2  Language- repeat 1 1  Language- follow 3 step command 3 3  Language- read & follow direction 1 1  Write a sentence 1 1  Copy design 1 1  Total score 28 30     6CIT Screen 11/23/2019 11/19/2018  What Year? 0 points 0 points  What month? 0 points 0 points  What time? - 0 points  Count back from 20 - 0 points  Months in reverse 0 points 0 points  Repeat phrase - 0 points  Total Score - 0    Immunizations Immunization History  Administered Date(s) Administered   PFIZER(Purple Top)SARS-COV-2 Vaccination 07/07/2019, 07/28/2019, 04/15/2020, 11/01/2020   Health Maintenance Health Maintenance  Topic Date Due   Zoster Vaccines- Shingrix (1 of 2) 02/23/2021 (Originally 03/18/1953)   INFLUENZA VACCINE  01/09/2021   MAMMOGRAM  02/08/2021   COVID-19 Vaccine (5 - Booster for Pfizer series) 03/04/2021   DEXA SCAN  Completed   HPV VACCINES  Aged Out   TETANUS/TDAP  Discontinued   PNA vac Low Risk Adult  Discontinued   Lung Cancer Screening: (Low Dose CT Chest recommended if Age 26-80 years, 30 pack-year currently smoking OR have quit w/in 15years.) does not qualify.   Hepatitis C Screening: does not qualify  Shingles vaccine- declined.  Vision Screening: Recommended annual ophthalmology exams for early detection of glaucoma and other disorders of the eye.  Dental Screening: Recommended annual dental exams for proper oral  hygiene  Community Resource Referral / Chronic Care Management: CRR required this visit?  No   CCM required this visit?  No      Plan:   Keep all routine maintenance appointments.   I have personally reviewed and noted the following in the patient's chart:   Medical and social history Use of alcohol, tobacco or illicit drugs  Current medications and supplements including opioid prescriptions. Patient does not currently take opioid.  Functional ability and status Nutritional status Physical activity Advanced directives List of other physicians Hospitalizations, surgeries, and ER visits in previous 12 months Vitals Screenings to include cognitive, depression, and falls Referrals and appointments  In addition, I have reviewed and discussed with patient certain preventive protocols, quality metrics, and best practice recommendations. A written personalized care plan for preventive services as well as general preventive health recommendations were provided to patient via mychart.     OBrien-Blaney, Lenola Lockner L, LPN   06/19/3233       I have reviewed the above information and agree with above.   Deborra Medina, MD

## 2020-12-06 ENCOUNTER — Other Ambulatory Visit: Payer: Self-pay

## 2020-12-06 ENCOUNTER — Ambulatory Visit (INDEPENDENT_AMBULATORY_CARE_PROVIDER_SITE_OTHER): Payer: Medicare HMO | Admitting: Internal Medicine

## 2020-12-06 VITALS — BP 126/70 | HR 78 | Temp 97.2°F | Resp 16 | Ht 63.0 in | Wt 170.4 lb

## 2020-12-06 DIAGNOSIS — M25561 Pain in right knee: Secondary | ICD-10-CM

## 2020-12-06 DIAGNOSIS — M25562 Pain in left knee: Secondary | ICD-10-CM

## 2020-12-06 DIAGNOSIS — T451X5A Adverse effect of antineoplastic and immunosuppressive drugs, initial encounter: Secondary | ICD-10-CM

## 2020-12-06 DIAGNOSIS — E7800 Pure hypercholesterolemia, unspecified: Secondary | ICD-10-CM

## 2020-12-06 DIAGNOSIS — R030 Elevated blood-pressure reading, without diagnosis of hypertension: Secondary | ICD-10-CM | POA: Diagnosis not present

## 2020-12-06 DIAGNOSIS — R32 Unspecified urinary incontinence: Secondary | ICD-10-CM

## 2020-12-06 DIAGNOSIS — C50411 Malignant neoplasm of upper-outer quadrant of right female breast: Secondary | ICD-10-CM

## 2020-12-06 DIAGNOSIS — G620 Drug-induced polyneuropathy: Secondary | ICD-10-CM

## 2020-12-06 DIAGNOSIS — M810 Age-related osteoporosis without current pathological fracture: Secondary | ICD-10-CM | POA: Diagnosis not present

## 2020-12-06 DIAGNOSIS — Z170 Estrogen receptor positive status [ER+]: Secondary | ICD-10-CM

## 2020-12-06 DIAGNOSIS — Z0000 Encounter for general adult medical examination without abnormal findings: Secondary | ICD-10-CM | POA: Diagnosis not present

## 2020-12-06 LAB — BASIC METABOLIC PANEL
BUN: 15 mg/dL (ref 6–23)
CO2: 30 mEq/L (ref 19–32)
Calcium: 9.5 mg/dL (ref 8.4–10.5)
Chloride: 104 mEq/L (ref 96–112)
Creatinine, Ser: 0.62 mg/dL (ref 0.40–1.20)
GFR: 81.6 mL/min (ref >60.00)
Glucose, Bld: 74 mg/dL (ref 70–99)
Potassium: 3.9 mEq/L (ref 3.5–5.1)
Sodium: 140 mEq/L (ref 135–145)

## 2020-12-06 LAB — LIPID PANEL
Cholesterol: 153 mg/dL (ref 0–200)
HDL: 52.4 mg/dL (ref >39.00)
LDL Cholesterol: 67 mg/dL (ref 0–99)
NonHDL: 100.67
Total CHOL/HDL Ratio: 3
Triglycerides: 168 mg/dL — ABNORMAL HIGH (ref 0.0–149.0)
VLDL: 33.6 mg/dL (ref 0.0–40.0)

## 2020-12-06 LAB — VITAMIN D 25 HYDROXY (VIT D DEFICIENCY, FRACTURES): VITD: 28.58 ng/mL — ABNORMAL LOW (ref 30.00–100.00)

## 2020-12-06 LAB — TSH: TSH: 2.41 u[IU]/mL (ref 0.35–4.50)

## 2020-12-06 NOTE — Progress Notes (Signed)
Subjective:    Patient ID: [name] [name], female    DOB: 07-05-1933, 87 y.o.   MRN: [id number]  HPI This visit occurred during the SARS-CoV-2 public health emergency.  Safety protocols were in place, including screening questions prior to the visit, additional usage of staff PPE, and extensive cleaning of exam room while observing appropriate contact time as indicated for disinfecting solutions.   Patient here for her physical exam.  Going to PT for her knee.  Stable.  Saw urology - OAB.  Desired no medication at this time.  Discussed PT for bladder/pelvic floor.  No chest pain or sob reported.  Saw pulmonary for possible sleep apnea.  HST - mild.  Recommended treatment for OAB.  She wants to monitor for now.  No acid reflux reported.  No abdominal pain.  Bowels moving.  Mammogram - scheduled.    Past Medical History:  Diagnosis Date   Diverticulitis of sigmoid colon    Endometriosis    Family history of breast cancer    Family history of ovarian cancer    Family history of prostate cancer    Family history of uterine cancer    Fibrocystic breast disease    Glaucoma    H/O urinary incontinence    History of chicken pox    History of colon polyps 2014   Hyperlipidemia    Hypertension    Ovarian cancer ([place]) 2002   Chemo tx's @ [facility] with Total Hysterectomy. Dr Amalia Hailey   Personal history of chemotherapy    ovarian cancer   Personal history of ovarian cancer    Port-A-Cath in place    Sinusitis    Tuberculosis    positve skin test   Past Surgical History:  Procedure Laterality Date   ABDOMINAL HYSTERECTOMY  2003   complete Dr Amalia Hailey   ABSCESS DRAINAGE  2017   right buttock at [place]  2003   BREAST EXCISIONAL BIOPSY Left 1993   excisional bx. negative results   BREAST LUMPECTOMY Right 03/18/2018   done at [facility] breast ca rad and chemo Invasive ductal carcinoma with focal squamous differentiation, Nottingham grade 3, is identified forming a 6 mm mass, associated  with high-grade ductal carcinoma in situ, radial scar and biopsy site changes.   BREAST SURGERY Left 1982   papilloma removal   COLONOSCOPY  2006, 2014   Dr Vira Agar    COLONOSCOPY N/A 03/27/2016   Procedure: COLONOSCOPY;  Surgeon: Robert Bellow, MD;  Location: [facility] ENDOSCOPY;  Service: Endoscopy;  Laterality: N/A;   COLOSTOMY REVERSAL  2014   at [place] Dr Raynaldo Opitz   NASAL SINUS SURGERY     For fungal infection   NASAL SINUS SURGERY  8/05 8/06   OOPHORECTOMY     OSTOMY  05/22/2012   diverticulitis with abcess/ Dr Tamala Julian   ovarian cancer     Exploratory lap   PORTA CATH INSERTION N/A 05/26/2018   Procedure: PORTA CATH INSERTION;  Surgeon: Algernon Huxley, MD;  Location: [place] CV LAB;  Service: Cardiovascular;  Laterality: N/A;   PORTA CATH REMOVAL N/A 11/12/2019   Procedure: PORTA CATH REMOVAL;  Surgeon: Algernon Huxley, MD;  Location: [place] CV LAB;  Service: Cardiovascular;  Laterality: N/A;   TONSILLECTOMY AND ADENOIDECTOMY  1947   Family History  Problem Relation Age of Onset   Hypertension Mother    Coronary artery disease Mother    Heart disease Mother    Kidney disease Mother  Diabetes Mother    Cancer Mother        uterine cancer dx 52s   Diabetes Father    Kidney failure Father    Diabetes Brother    Coronary artery disease Brother    Cancer Brother        bladder cancer   Stroke Sister    Heart disease Sister        [name]   Cancer Maternal Aunt 49       ovarian cancer   Breast cancer Cousin    Cancer Brother        Prostate   Social History   Socioeconomic History   Marital status: Widowed    Spouse name: Not on file   Number of children: 0   Years of education: Not on file   Highest education level: Not on file  Occupational History   Not on file  Tobacco Use   Smoking status: Never   Smokeless tobacco: Never  Vaping Use   Vaping Use: Never used  Substance and Sexual Activity   Alcohol use: No    Alcohol/week: 0.0 standard  drinks   Drug use: No   Sexual activity: Not Currently  Other Topics Concern   Not on file  Social History Narrative   Lives at home alone with dog   Social Determinants of Health   Financial Resource Strain: Low Risk    Difficulty of Paying Living Expenses: Not hard at all  Food Insecurity: No Food Insecurity   Worried About Charity fundraiser in the Last Year: Never true   [place] in the Last Year: Never true  Transportation Needs: No Transportation Needs   Lack of Transportation (Medical): No   Lack of Transportation (Non-Medical): No  Physical Activity: Not on file  Stress: No Stress Concern Present   Feeling of Stress : Not at all  Social Connections: Unknown   Frequency of Communication with Friends and Family: More than three times a week   Frequency of Social Gatherings with Friends and Family: Not on file   Attends Religious Services: Not on file   Active Member of Clubs or Organizations: Not on file   Attends Archivist Meetings: Not on file   Marital Status: Not on file    Review of Systems  Constitutional:  Negative for appetite change and unexpected weight change.  HENT:  Negative for congestion, sinus pressure and sore throat.   Eyes:  Negative for pain and visual disturbance.  Respiratory:  Negative for cough, chest tightness and shortness of breath.   Cardiovascular:  Negative for chest pain, palpitations and leg swelling.  Gastrointestinal:  Negative for abdominal pain, diarrhea, nausea and vomiting.  Genitourinary:  Negative for difficulty urinating and dysuria.  Musculoskeletal:  Negative for joint swelling and myalgias.  Skin:  Negative for color change and rash.  Neurological:  Negative for dizziness, light-headedness and headaches.  Hematological:  Negative for adenopathy. Does not bruise/bleed easily.  Psychiatric/Behavioral:  Negative for decreased concentration and dysphoric mood.       Objective:    Physical Exam Vitals  reviewed.  Constitutional:      General: She is not in acute distress.    Appearance: Normal appearance. She is well-developed.  HENT:     Head: Normocephalic and atraumatic.     Right Ear: External ear normal.     Left Ear: External ear normal.  Eyes:     General: No scleral icterus.  Right eye: No discharge.        Left eye: No discharge.     Conjunctiva/sclera: Conjunctivae normal.  Neck:     Thyroid: No thyromegaly.  Cardiovascular:     Rate and Rhythm: Normal rate and regular rhythm.  Pulmonary:     Effort: No tachypnea, accessory muscle usage or respiratory distress.     Breath sounds: Normal breath sounds. No decreased breath sounds or wheezing.  Chest:  Breasts:    Right: No inverted nipple, mass, nipple discharge or tenderness (no axillary adenopathy).     Left: No inverted nipple, mass, nipple discharge or tenderness (no axilarry adenopathy).  Abdominal:     General: Bowel sounds are normal.     Palpations: Abdomen is soft.     Tenderness: There is no abdominal tenderness.  Musculoskeletal:        General: No swelling or tenderness.     Cervical back: Neck supple.  Lymphadenopathy:     Cervical: No cervical adenopathy.  Skin:    Findings: No erythema or rash.  Neurological:     Mental Status: She is alert and oriented to person, place, and time.  Psychiatric:        Mood and Affect: Mood normal.        Behavior: Behavior normal.    BP 126/70   Pulse 78   Temp (!) 97.2 F (36.2 C)   Resp 16   Ht 5\' 3"  (1.6 m)   Wt 170 lb 6.4 oz (77.3 kg)   SpO2 98%   BMI 30.19 kg/m  Wt Readings from Last 3 Encounters:  12/06/20 170 lb 6.4 oz (77.3 kg)  11/23/20 174 lb (78.9 kg)  09/05/20 174 lb (78.9 kg)    Outpatient Encounter Medications as of 12/06/2020  Medication Sig   Calcium Carbonate-Vitamin D 600-400 MG-UNIT tablet Take 1 tablet by mouth daily.    Multiple Vitamins-Minerals (MULTIVITAMIN WITH MINERALS) tablet Take 1 tablet by mouth daily.   tamoxifen  (NOLVADEX) 20 MG tablet Take 1 tablet (20 mg total) by mouth daily.   [DISCONTINUED] amLODipine (NORVASC) 2.5 MG tablet TAKE 1 TABLET BY MOUTH EVERY DAY (Patient not taking: Reported on 12/06/2020)   Facility-Administered Encounter Medications as of 12/06/2020  Medication   lidocaine-prilocaine (EMLA) cream   sodium chloride 0.9 % 50 mL with famotidine (PEPCID) 20 mg infusion   sodium chloride flush (NS) 0.9 % injection 10 mL     Lab Results  Component Value Date   WBC 8.1 08/18/2020   HGB 12.3 08/18/2020   HCT 38.3 08/18/2020   PLT 245 08/18/2020   GLUCOSE 74 12/06/2020   CHOL 153 12/06/2020   TRIG 168.0 (H) 12/06/2020   HDL 52.40 12/06/2020   LDLCALC 67 12/06/2020   ALT 21 08/18/2020   AST 24 08/18/2020   NA 140 12/06/2020   K 3.9 12/06/2020   CL 104 12/06/2020   CREATININE 0.62 12/06/2020   BUN 15 12/06/2020   CO2 30 12/06/2020   TSH 2.41 12/06/2020   INR 1.0 05/21/2012    MM DIAG BREAST TOMO BILATERAL  Result Date: 02/09/2020 CLINICAL DATA:  Status post right lumpectomy, chemotherapy and radiation therapy for breast cancer in 2019. EXAM: DIGITAL DIAGNOSTIC BILATERAL MAMMOGRAM WITH TOMO AND CAD COMPARISON:  Previous examinations, the most recent a bilateral diagnostic mammogram at [facility] dated 02/06/2019. ACR Breast Density Category c: The breast tissue is heterogeneously dense, which may obscure small masses. FINDINGS: Stable post lumpectomy changes on the  right. No interval findings suspicious for malignancy in either breast. Mammographic images were processed with CAD. IMPRESSION: No evidence of malignancy. RECOMMENDATION: Bilateral diagnostic mammogram in 1 year. I have discussed the findings and recommendations with the patient. If applicable, a reminder letter will be sent to the patient regarding the next appointment. BI-RADS CATEGORY  2: Benign. Electronically Signed   By: Claudie Revering M.D.   On: 02/09/2020 12:25       Assessment & Plan:    Problem List Items Addressed This Visit     Carcinoma of upper-outer quadrant of right breast in female, estrogen receptor positive ([place])    Continue tamoxifen.  Followed by oncology.  F/u mammogram scheduled 02/2021.        Chemotherapy-induced neuropathy ([place])    Documented - oncology.  Stable.        Elevated blood pressure reading   Relevant Orders   TSH (Completed)   Health care maintenance    Physical today 12/06/20.  Colonoscopy 03/2016.  Being followed by oncology for breast cancer.  Mammogram scheduled for 02/09/21        Hypercholesterolemia    Low cholesterol diet and exercise.  Follow lipid panel.        Relevant Orders   Lipid panel (Completed)   Basic metabolic panel (Completed)   Incontinence of urine    Evaluated by urology.  Discussed trial myrbetriq. Prefers no medication at this time. Follow.  Mild sleep apnea - HST.         Knee pain    Physical therapy.  Follow.        Osteoporosis    Receiving prolia.  Check vitamin D level.        Relevant Orders   VITAMIN D 25 Hydroxy (Vit-D Deficiency, Fractures) (Completed)   Other Visit Diagnoses     Routine general medical examination at a health care facility    -  Primary        Einar Pheasant, MD

## 2020-12-12 ENCOUNTER — Encounter: Payer: Self-pay | Admitting: Internal Medicine

## 2020-12-12 NOTE — Assessment & Plan Note (Signed)
Physical therapy.  Follow.

## 2020-12-12 NOTE — Assessment & Plan Note (Signed)
Evaluated by urology.  Discussed trial myrbetriq. Prefers no medication at this time. Follow.  Mild sleep apnea - HST.

## 2020-12-12 NOTE — Assessment & Plan Note (Signed)
Continue tamoxifen.  Followed by oncology.  F/u mammogram scheduled 02/2021.

## 2020-12-12 NOTE — Assessment & Plan Note (Signed)
Low cholesterol diet and exercise.  Follow lipid panel.   

## 2020-12-12 NOTE — Assessment & Plan Note (Signed)
Receiving prolia.  Check vitamin D level.

## 2020-12-12 NOTE — Assessment & Plan Note (Addendum)
Physical today 12/06/20.  Colonoscopy 03/2016.  Being followed by oncology for breast cancer.  Mammogram scheduled for 02/09/21

## 2020-12-12 NOTE — Assessment & Plan Note (Signed)
Documented - oncology.  Stable.  

## 2021-01-26 ENCOUNTER — Ambulatory Visit (INDEPENDENT_AMBULATORY_CARE_PROVIDER_SITE_OTHER): Payer: Medicare HMO

## 2021-01-26 ENCOUNTER — Other Ambulatory Visit: Payer: Self-pay

## 2021-01-26 DIAGNOSIS — M810 Age-related osteoporosis without current pathological fracture: Secondary | ICD-10-CM

## 2021-01-26 MED ORDER — DENOSUMAB 60 MG/ML ~~LOC~~ SOSY
60.0000 mg | PREFILLED_SYRINGE | Freq: Once | SUBCUTANEOUS | Status: AC
  Administered 2021-01-26: 60 mg via SUBCUTANEOUS

## 2021-01-26 NOTE — Progress Notes (Signed)
Patient presented for 6-month Prolia injection SQ to left arm. Patient tolerated well. 

## 2021-02-09 ENCOUNTER — Inpatient Hospital Stay: Admission: RE | Admit: 2021-02-09 | Payer: Medicare HMO | Source: Ambulatory Visit

## 2021-02-16 ENCOUNTER — Ambulatory Visit
Admission: RE | Admit: 2021-02-16 | Discharge: 2021-02-16 | Disposition: A | Discharge status: Home / Self Care | Payer: Medicare HMO | Source: Ambulatory Visit | Attending: Hematology and Oncology | Admitting: Hematology and Oncology

## 2021-02-16 ENCOUNTER — Other Ambulatory Visit: Payer: Self-pay

## 2021-02-16 DIAGNOSIS — Z170 Estrogen receptor positive status [ER+]: Secondary | ICD-10-CM | POA: Insufficient documentation

## 2021-02-16 DIAGNOSIS — Z1231 Encounter for screening mammogram for malignant neoplasm of breast: Secondary | ICD-10-CM | POA: Diagnosis present

## 2021-02-16 DIAGNOSIS — C50411 Malignant neoplasm of upper-outer quadrant of right female breast: Secondary | ICD-10-CM | POA: Diagnosis not present

## 2021-02-16 HISTORY — DX: Malignant neoplasm of unspecified site of unspecified female breast: C50.919

## 2021-02-16 HISTORY — DX: Personal history of irradiation: Z92.3

## 2021-02-17 ENCOUNTER — Other Ambulatory Visit: Payer: Self-pay | Admitting: *Deleted

## 2021-02-17 DIAGNOSIS — C50411 Malignant neoplasm of upper-outer quadrant of right female breast: Secondary | ICD-10-CM

## 2021-02-17 MED ORDER — TAMOXIFEN CITRATE 20 MG PO TABS: 20.0000 mg | ORAL_TABLET | Freq: Every day | ORAL | 0 refills | Status: DC

## 2021-02-21 ENCOUNTER — Other Ambulatory Visit: Payer: Self-pay

## 2021-02-21 ENCOUNTER — Other Ambulatory Visit: Payer: Self-pay | Admitting: *Deleted

## 2021-02-21 DIAGNOSIS — Z170 Estrogen receptor positive status [ER+]: Secondary | ICD-10-CM

## 2021-02-21 DIAGNOSIS — C50411 Malignant neoplasm of upper-outer quadrant of right female breast: Secondary | ICD-10-CM

## 2021-02-22 ENCOUNTER — Encounter: Payer: Self-pay | Admitting: Oncology

## 2021-02-22 ENCOUNTER — Inpatient Hospital Stay: Payer: Medicare HMO | Admitting: Oncology

## 2021-02-22 ENCOUNTER — Inpatient Hospital Stay: Payer: Medicare HMO | Attending: Oncology

## 2021-02-22 ENCOUNTER — Other Ambulatory Visit: Payer: Self-pay

## 2021-02-22 VITALS — BP 152/62 | HR 68 | Temp 98.4°F | Resp 18 | Wt 170.5 lb

## 2021-02-22 DIAGNOSIS — Z79810 Long term (current) use of selective estrogen receptor modulators (SERMs): Secondary | ICD-10-CM | POA: Diagnosis not present

## 2021-02-22 DIAGNOSIS — Z8543 Personal history of malignant neoplasm of ovary: Secondary | ICD-10-CM | POA: Insufficient documentation

## 2021-02-22 DIAGNOSIS — Z08 Encounter for follow-up examination after completed treatment for malignant neoplasm: Secondary | ICD-10-CM | POA: Diagnosis not present

## 2021-02-22 DIAGNOSIS — Z9221 Personal history of antineoplastic chemotherapy: Secondary | ICD-10-CM | POA: Diagnosis not present

## 2021-02-22 DIAGNOSIS — M818 Other osteoporosis without current pathological fracture: Secondary | ICD-10-CM | POA: Diagnosis not present

## 2021-02-22 DIAGNOSIS — Z5181 Encounter for therapeutic drug level monitoring: Secondary | ICD-10-CM | POA: Diagnosis not present

## 2021-02-22 DIAGNOSIS — Z923 Personal history of irradiation: Secondary | ICD-10-CM | POA: Diagnosis not present

## 2021-02-22 DIAGNOSIS — Z853 Personal history of malignant neoplasm of breast: Secondary | ICD-10-CM

## 2021-02-22 DIAGNOSIS — C50411 Malignant neoplasm of upper-outer quadrant of right female breast: Secondary | ICD-10-CM

## 2021-02-22 DIAGNOSIS — C50911 Malignant neoplasm of unspecified site of right female breast: Secondary | ICD-10-CM | POA: Diagnosis not present

## 2021-02-22 DIAGNOSIS — M810 Age-related osteoporosis without current pathological fracture: Secondary | ICD-10-CM | POA: Insufficient documentation

## 2021-02-22 DIAGNOSIS — Z170 Estrogen receptor positive status [ER+]: Secondary | ICD-10-CM | POA: Insufficient documentation

## 2021-02-22 LAB — COMPREHENSIVE METABOLIC PANEL
ALT: 18 U/L (ref 0–44)
AST: 23 U/L (ref 15–41)
Albumin: 3.2 g/dL — ABNORMAL LOW (ref 3.5–5.0)
Alkaline Phosphatase: 42 U/L (ref 38–126)
Anion gap: 4 — ABNORMAL LOW (ref 5–15)
BUN: 21 mg/dL (ref 8–23)
CO2: 28 mmol/L (ref 22–32)
Calcium: 8.2 mg/dL — ABNORMAL LOW (ref 8.9–10.3)
Chloride: 103 mmol/L (ref 98–111)
Creatinine, Ser: 0.8 mg/dL (ref 0.44–1.00)
GFR, Estimated: >60 mL/min (ref >60)
Glucose, Bld: 88 mg/dL (ref 70–99)
Potassium: 4.1 mmol/L (ref 3.5–5.1)
Sodium: 135 mmol/L (ref 135–145)
Total Bilirubin: 0.4 mg/dL (ref 0.3–1.2)
Total Protein: 5.9 g/dL — ABNORMAL LOW (ref 6.5–8.1)

## 2021-02-22 LAB — CBC WITH DIFFERENTIAL/PLATELET
Abs Immature Granulocytes: 0.01 10*3/uL (ref 0.00–0.07)
Basophils Absolute: 0.1 10*3/uL (ref 0.0–0.1)
Basophils Relative: 1 %
Eosinophils Absolute: 0.1 10*3/uL (ref 0.0–0.5)
Eosinophils Relative: 1 %
HCT: 35.8 % — ABNORMAL LOW (ref 36.0–46.0)
Hemoglobin: 11.7 g/dL — ABNORMAL LOW (ref 12.0–15.0)
Immature Granulocytes: 0 %
Lymphocytes Relative: 30 %
Lymphs Abs: 2.4 10*3/uL (ref 0.7–4.0)
MCH: 31.5 pg (ref 26.0–34.0)
MCHC: 32.7 g/dL (ref 30.0–36.0)
MCV: 96.2 fL (ref 80.0–100.0)
Monocytes Absolute: 0.8 10*3/uL (ref 0.1–1.0)
Monocytes Relative: 10 %
Neutro Abs: 4.7 10*3/uL (ref 1.7–7.7)
Neutrophils Relative %: 58 %
Platelets: 246 10*3/uL (ref 150–400)
RBC: 3.72 MIL/uL — ABNORMAL LOW (ref 3.87–5.11)
RDW: 13.5 % (ref 11.5–15.5)
WBC: 8 10*3/uL (ref 4.0–10.5)
nRBC: 0 % (ref 0.0–0.2)

## 2021-02-23 LAB — CANCER ANTIGEN 27.29: CA 27.29: <3.5 U/mL (ref 0.0–38.6)

## 2021-02-26 ENCOUNTER — Encounter: Payer: Self-pay | Admitting: Hematology and Oncology

## 2021-02-26 NOTE — Progress Notes (Signed)
Hematology/Oncology Consult note [facility]  [phone] [phone]  Patient Care Team: Einar Pheasant, MD as PCP - General (Internal Medicine) Caryl Bis Angela Adam, MD as Consulting Physician (Family Medicine) Christene Lye, MD (General Surgery) Einar Pheasant, MD (Internal Medicine) Bary Castilla, Forest Gleason, MD (General Surgery) Lequita Asal, MD (Inactive) as Referring Physician (Hematology and Oncology) Noreene Filbert, MD as Referring Physician (Radiation Oncology) Delana Meyer, Dolores Lory, MD as Consulting Physician (Vascular Surgery)   Name of the patient: [name] [name]  [id number]  11-Feb-1934   Date of visit: 02/26/21  Diagnosis-history of breast cancer  Chief complaint/ Reason for visit-routine follow-up of breast cancer  Heme/Onc history: Patient is a 87 year old female who was diagnosed with stage I HER2 positiveRight breast cancer in 2019 s/p lumpectomy and sentinel lymph node biopsy.  Final pathology showed 0.6 cm grade 3 invasive mammary carcinoma ER 99% positive PR 25% positive and HER2 positive by FISH.  She received 12 weeks of Herceptin and Taxol followed by weekly Herceptin for 1 year.  She also received adjuvant radiation treatment she is currently on tamoxifen for hormone therapy  Also has a history of stage IIb ovarian cancer s/p exploratory laparotomy TAH/BSO omentectomy peritoneal biopsies and pelvic and periaortic lymph node sampling back in 2003.  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  She has also received adjuvant CarboTaxol for it back then.  Has baseline osteoporosis on bone density.  Interval history-overall patient is doing well for her age.  Denies any significant fatigue nausea vomiting or any breast concerns.  Tolerating tamoxifen well without any significant side effects.  ECOG PS- 1 Pain scale- 0   Review of systems- Review of Systems  Constitutional:  Negative for  chills, fever, malaise/fatigue and weight loss.  HENT:  Negative for congestion, ear discharge and nosebleeds.   Eyes:  Negative for blurred vision.  Respiratory:  Negative for cough, hemoptysis, sputum production, shortness of breath and wheezing.   Cardiovascular:  Negative for chest pain, palpitations, orthopnea and claudication.  Gastrointestinal:  Negative for abdominal pain, blood in stool, constipation, diarrhea, heartburn, melena, nausea and vomiting.  Genitourinary:  Negative for dysuria, flank pain, frequency, hematuria and urgency.  Musculoskeletal:  Negative for back pain, joint pain and myalgias.  Skin:  Negative for rash.  Neurological:  Negative for dizziness, tingling, focal weakness, seizures, weakness and headaches.  Endo/Heme/Allergies:  Does not bruise/bleed easily.  Psychiatric/Behavioral:  Negative for depression and suicidal ideas. The patient does not have insomnia.       No Known Allergies   Past Medical History:  Diagnosis Date   Breast cancer ([address])    Diverticulitis of sigmoid colon    Endometriosis    Family history of breast cancer    Family history of ovarian cancer    Family history of prostate cancer    Family history of uterine cancer    Fibrocystic breast disease    Glaucoma    H/O urinary incontinence    History of chicken pox    History of colon polyps 2014   Hyperlipidemia    Hypertension    Ovarian cancer ([place]) 2002   Chemo tx's @ [facility] with Total Hysterectomy. Dr Amalia Hailey   Personal history of chemotherapy    ovarian cancer   Personal history of ovarian cancer    Personal history of radiation therapy    Port-A-Cath in place    Sinusitis    Tuberculosis    positve skin test  Past Surgical History:  Procedure Laterality Date   ABDOMINAL HYSTERECTOMY  2003   complete Dr Amalia Hailey   ABSCESS DRAINAGE  2017   right buttock at [place]  2003   BREAST EXCISIONAL BIOPSY Left 1993   excisional bx. negative results   BREAST  LUMPECTOMY Right 03/18/2018   done at [facility] breast ca rad and chemo Invasive ductal carcinoma with focal squamous differentiation, Nottingham grade 3, is identified forming a 6 mm mass, associated with high-grade ductal carcinoma in situ, radial scar and biopsy site changes.   BREAST SURGERY Left 1982   papilloma removal   COLONOSCOPY  2006, 2014   Dr Vira Agar    COLONOSCOPY N/A 03/27/2016   Procedure: COLONOSCOPY;  Surgeon: Robert Bellow, MD;  Location: [facility] ENDOSCOPY;  Service: Endoscopy;  Laterality: N/A;   COLOSTOMY REVERSAL  2014   at [place] Dr Raynaldo Opitz   NASAL SINUS SURGERY     For fungal infection   NASAL SINUS SURGERY  8/05 8/06   OOPHORECTOMY     OSTOMY  05/22/2012   diverticulitis with abcess/ Dr Tamala Julian   ovarian cancer     Exploratory lap   PORTA CATH INSERTION N/A 05/26/2018   Procedure: PORTA CATH INSERTION;  Surgeon: Algernon Huxley, MD;  Location: [place] CV LAB;  Service: Cardiovascular;  Laterality: N/A;   PORTA CATH REMOVAL N/A 11/12/2019   Procedure: PORTA CATH REMOVAL;  Surgeon: Algernon Huxley, MD;  Location: [place] CV LAB;  Service: Cardiovascular;  Laterality: N/A;   TONSILLECTOMY AND ADENOIDECTOMY  1947    Social History   Socioeconomic History   Marital status: Widowed    Spouse name: Not on file   Number of children: 0   Years of education: Not on file   Highest education level: Not on file  Occupational History   Not on file  Tobacco Use   Smoking status: Never   Smokeless tobacco: Never  Vaping Use   Vaping Use: Never used  Substance and Sexual Activity   Alcohol use: No    Alcohol/week: 0.0 standard drinks   Drug use: No   Sexual activity: Not Currently  Other Topics Concern   Not on file  Social History Narrative   Lives at home alone with dog   Social Determinants of Health   Financial Resource Strain: Low Risk    Difficulty of Paying Living Expenses: Not hard at all  Food Insecurity: No Food Insecurity   Worried About  Charity fundraiser in the Last Year: Never true   [place] in the Last Year: Never true  Transportation Needs: No Transportation Needs   Lack of Transportation (Medical): No   Lack of Transportation (Non-Medical): No  Physical Activity: Not on file  Stress: No Stress Concern Present   Feeling of Stress : Not at all  Social Connections: Unknown   Frequency of Communication with Friends and Family: More than three times a week   Frequency of Social Gatherings with Friends and Family: Not on file   Attends Religious Services: Not on file   Active Member of Clubs or Organizations: Not on file   Attends Archivist Meetings: Not on file   Marital Status: Not on file  Intimate Partner Violence: Not At Risk   Fear of Current or Ex-Partner: No   Emotionally Abused: No   Physically Abused: No   Sexually Abused: No    Family History  Problem Relation Age of  Onset   Hypertension Mother    Coronary artery disease Mother    Heart disease Mother    Kidney disease Mother    Diabetes Mother    Cancer Mother        uterine cancer dx 48s   Diabetes Father    Kidney failure Father    Diabetes Brother    Coronary artery disease Brother    Cancer Brother        bladder cancer   Stroke Sister    Heart disease Sister        Pace Maker   Cancer Maternal Aunt 49       ovarian cancer   Breast cancer Cousin    Cancer Brother        Prostate     Current Outpatient Medications:    Calcium Carbonate-Vitamin D 600-400 MG-UNIT tablet, Take 1 tablet by mouth daily. , Disp: , Rfl:    Multiple Vitamins-Minerals (MULTIVITAMIN WITH MINERALS) tablet, Take 1 tablet by mouth daily., Disp: , Rfl:    tamoxifen (NOLVADEX) 20 MG tablet, Take 1 tablet (20 mg total) by mouth daily., Disp: 30 tablet, Rfl: 0  Current Facility-Administered Medications:    lidocaine-prilocaine (EMLA) cream, , Topical, PRN, Dew, Erskine Squibb, MD  Facility-Administered Medications Ordered in Other Visits:    sodium  chloride 0.9 % 50 mL with famotidine (PEPCID) 20 mg infusion, 20 mg, Intravenous, Once, Brahmanday, Govinda R, MD   sodium chloride flush (NS) 0.9 % injection 10 mL, 10 mL, Intravenous, PRN, Charlaine Dalton R, MD, 10 mL at 06/24/18 0854  Physical exam:  Vitals:   02/22/21 1009  BP: (!) 152/62  Pulse: 68  Resp: 18  Temp: 98.4 F (36.9 C)  SpO2: 99%  Weight: 170 lb 8.4 oz (77.3 kg)   Physical Exam Constitutional:      General: She is not in acute distress. Cardiovascular:     Rate and Rhythm: Normal rate and regular rhythm.     Heart sounds: Normal heart sounds.  Pulmonary:     Effort: Pulmonary effort is normal.     Breath sounds: Normal breath sounds.  Abdominal:     General: Bowel sounds are normal.     Palpations: Abdomen is soft.  Skin:    General: Skin is warm and dry.  Neurological:     Mental Status: She is alert and oriented to person, place, and time.   Breast exam was performed in seated and lying down position. Patient is status post right lumpectomy with a well-healed surgical scar. No evidence of any palpable masses. No evidence of axillary adenopathy. No evidence of any palpable masses or lumps in the left breast. No evidence of leftt axillary adenopathy   CMP Latest Ref Rng & Units 02/22/2021  Glucose 70 - 99 mg/dL 88  BUN 8 - 23 mg/dL 21  Creatinine 0.44 - 1.00 mg/dL 0.80  Sodium 135 - 145 mmol/L 135  Potassium 3.5 - 5.1 mmol/L 4.1  Chloride 98 - 111 mmol/L 103  CO2 22 - 32 mmol/L 28  Calcium 8.9 - 10.3 mg/dL 8.2(L)  Total Protein 6.5 - 8.1 g/dL 5.9(L)  Total Bilirubin 0.3 - 1.2 mg/dL 0.4  Alkaline Phos 38 - 126 U/L 42  AST 15 - 41 U/L 23  ALT 0 - 44 U/L 18   CBC Latest Ref Rng & Units 02/22/2021  WBC 4.0 - 10.5 K/uL 8.0  Hemoglobin 12.0 - 15.0 g/dL 11.7(L)  Hematocrit 36.0 - 46.0 % 35.8(L)  Platelets  150 - 400 K/uL 246    No images are attached to the encounter.  MM 3D SCREEN BREAST BILATERAL  Result Date: 02/20/2021 CLINICAL DATA:   Screening. EXAM: DIGITAL SCREENING BILATERAL MAMMOGRAM WITH TOMOSYNTHESIS AND CAD TECHNIQUE: Bilateral screening digital craniocaudal and mediolateral oblique mammograms were obtained. Bilateral screening digital breast tomosynthesis was performed. The images were evaluated with computer-aided detection. COMPARISON:  Previous exam(s). ACR Breast Density Category c: The breast tissue is heterogeneously dense, which may obscure small masses. FINDINGS: There are no findings suspicious for malignancy. IMPRESSION: No mammographic evidence of malignancy. A result letter of this screening mammogram will be mailed directly to the patient. RECOMMENDATION: Screening mammogram in one year. (Code:SM-B-01Y) BI-RADS CATEGORY  1: Negative. Electronically Signed   By: Claudie Revering M.D.   On: 02/20/2021 08:53    Assessment and plan- Patient is a 87 y.o. female with history of stage I ER positive HER2 positive right breast cancer in 2019 s/p surgery adjuvant chemotherapy radiation treatment and currently on tamoxifen here for routine follow-up  Clinically patient is doing well with no concerning signs and symptoms of recurrent perjeta today's exam.  She is -Well without any significant side effects.  Her last bone density scan was in October 2022 which showed osteoporosis and we will plan to repeat another bone density scan at this time.  She has been receiving Prolia through Dr. Nicki Reaper.  Last dose was given on 01/26/2021.  Recent mammogram from 02/16/2021 was unremarkable.  I will see her back in 6 months   Visit Diagnosis 1. Encounter for follow-up surveillance of breast cancer   2. Encounter for monitoring tamoxifen therapy   3. Other osteoporosis without current pathological fracture      Dr. Randa Evens, MD, MPH [facility] [phone] 02/26/2021 8:50 AM

## 2021-03-09 ENCOUNTER — Ambulatory Visit (INDEPENDENT_AMBULATORY_CARE_PROVIDER_SITE_OTHER): Payer: Medicare HMO | Admitting: Internal Medicine

## 2021-03-09 ENCOUNTER — Other Ambulatory Visit: Payer: Self-pay

## 2021-03-09 VITALS — BP 128/70 | HR 79 | Temp 97.6°F | Resp 16 | Ht 63.0 in | Wt 172.4 lb

## 2021-03-09 DIAGNOSIS — G620 Drug-induced polyneuropathy: Secondary | ICD-10-CM

## 2021-03-09 DIAGNOSIS — E7800 Pure hypercholesterolemia, unspecified: Secondary | ICD-10-CM

## 2021-03-09 DIAGNOSIS — D649 Anemia, unspecified: Secondary | ICD-10-CM

## 2021-03-09 DIAGNOSIS — M25562 Pain in left knee: Secondary | ICD-10-CM

## 2021-03-09 DIAGNOSIS — T451X5A Adverse effect of antineoplastic and immunosuppressive drugs, initial encounter: Secondary | ICD-10-CM

## 2021-03-09 DIAGNOSIS — C50411 Malignant neoplasm of upper-outer quadrant of right female breast: Secondary | ICD-10-CM

## 2021-03-09 DIAGNOSIS — Z170 Estrogen receptor positive status [ER+]: Secondary | ICD-10-CM

## 2021-03-09 DIAGNOSIS — M810 Age-related osteoporosis without current pathological fracture: Secondary | ICD-10-CM

## 2021-03-09 DIAGNOSIS — M25561 Pain in right knee: Secondary | ICD-10-CM

## 2021-03-09 DIAGNOSIS — E559 Vitamin D deficiency, unspecified: Secondary | ICD-10-CM

## 2021-03-09 LAB — CBC WITH DIFFERENTIAL/PLATELET
Basophils Absolute: 0 10*3/uL (ref 0.0–0.1)
Basophils Relative: 0.5 % (ref 0.0–3.0)
Eosinophils Absolute: 0.1 10*3/uL (ref 0.0–0.7)
Eosinophils Relative: 0.8 % (ref 0.0–5.0)
HCT: 38.5 % (ref 36.0–46.0)
Hemoglobin: 12.5 g/dL (ref 12.0–15.0)
Lymphocytes Relative: 28.7 % (ref 12.0–46.0)
Lymphs Abs: 2.4 10*3/uL (ref 0.7–4.0)
MCHC: 32.5 g/dL (ref 30.0–36.0)
MCV: 95.2 fl (ref 78.0–100.0)
Monocytes Absolute: 0.7 10*3/uL (ref 0.1–1.0)
Monocytes Relative: 8.1 % (ref 3.0–12.0)
Neutro Abs: 5.2 10*3/uL (ref 1.4–7.7)
Neutrophils Relative %: 61.9 % (ref 43.0–77.0)
Platelets: 244 10*3/uL (ref 150.0–400.0)
RBC: 4.04 Mil/uL (ref 3.87–5.11)
RDW: 13.7 % (ref 11.5–15.5)
WBC: 8.4 10*3/uL (ref 4.0–10.5)

## 2021-03-09 LAB — LIPID PANEL
Cholesterol: 146 mg/dL (ref 0–200)
HDL: 55 mg/dL (ref >39.00)
LDL Cholesterol: 57 mg/dL (ref 0–99)
NonHDL: 91.09
Total CHOL/HDL Ratio: 3
Triglycerides: 169 mg/dL — ABNORMAL HIGH (ref 0.0–149.0)
VLDL: 33.8 mg/dL (ref 0.0–40.0)

## 2021-03-09 LAB — VITAMIN B12: Vitamin B-12: 205 pg/mL — ABNORMAL LOW (ref 211–911)

## 2021-03-09 LAB — IBC + FERRITIN
Ferritin: 19.6 ng/mL (ref 10.0–291.0)
Iron: 103 ug/dL (ref 42–145)
Saturation Ratios: 23.9 % (ref 20.0–50.0)
TIBC: 431.2 ug/dL (ref 250.0–450.0)
Transferrin: 308 mg/dL (ref 212.0–360.0)

## 2021-03-09 MED ORDER — AMLODIPINE BESYLATE 2.5 MG PO TABS: 2.5000 mg | ORAL_TABLET | Freq: Every day | ORAL | 2 refills | Status: DC

## 2021-03-09 NOTE — Progress Notes (Signed)
Patient ID: [name] [name], female   DOB: 1933-12-20, 87 y.o.   MRN: [id number]   Subjective:    Patient ID: [name] [name], female    DOB: 06/05/1934, 87 y.o.   MRN: [id number]  This visit occurred during the SARS-CoV-2 public health emergency.  Safety protocols were in place, including screening questions prior to the visit, additional usage of staff PPE, and extensive cleaning of exam room while observing appropriate contact time as indicated for disinfecting solutions.   Patient here for a scheduled follow up.   Chief Complaint  Patient presents with   Hyperlipidemia   Hypertension   .   HPI Reports increased knee pain - states has bone on bone.  Has been bothering her for a while now.  Can limit activity.  Using voltaren gel and tylenol.  Request referral to Dr Wynelle Link.  No chest pain or sob reported.  No abdominal pain.  [place] moving.     Past Medical History:  Diagnosis Date   Breast cancer ([place])    Diverticulitis of sigmoid colon    Endometriosis    Family history of breast cancer    Family history of ovarian cancer    Family history of prostate cancer    Family history of uterine cancer    Fibrocystic breast disease    Glaucoma    H/O urinary incontinence    History of chicken pox    History of colon polyps 2014   Hyperlipidemia    Hypertension    Ovarian cancer ([place]) 2002   Chemo tx's @ [facility] with Total Hysterectomy. Dr Amalia Hailey   Personal history of chemotherapy    ovarian cancer   Personal history of ovarian cancer    Personal history of radiation therapy    Port-A-Cath in place    Sinusitis    Tuberculosis    positve skin test   Past Surgical History:  Procedure Laterality Date   ABDOMINAL HYSTERECTOMY  2003   complete Dr Amalia Hailey   ABSCESS DRAINAGE  2017   right buttock at [place]  2003   BREAST EXCISIONAL BIOPSY Left 1993   excisional bx. negative results   BREAST LUMPECTOMY Right 03/18/2018   done at [facility] breast ca rad and chemo Invasive  ductal carcinoma with focal squamous differentiation, Nottingham grade 3, is identified forming a 6 mm mass, associated with high-grade ductal carcinoma in situ, radial scar and biopsy site changes.   BREAST SURGERY Left 1982   papilloma removal   COLONOSCOPY  2006, 2014   Dr Vira Agar    COLONOSCOPY N/A 03/27/2016   Procedure: COLONOSCOPY;  Surgeon: Robert Bellow, MD;  Location: [facility] ENDOSCOPY;  Service: Endoscopy;  Laterality: N/A;   COLOSTOMY REVERSAL  2014   at [place] Dr Raynaldo Opitz   NASAL SINUS SURGERY     For fungal infection   NASAL SINUS SURGERY  8/05 8/06   OOPHORECTOMY     OSTOMY  05/22/2012   diverticulitis with abcess/ Dr Tamala Julian   ovarian cancer     Exploratory lap   PORTA CATH INSERTION N/A 05/26/2018   Procedure: PORTA CATH INSERTION;  Surgeon: Algernon Huxley, MD;  Location: [place] CV LAB;  Service: Cardiovascular;  Laterality: N/A;   PORTA CATH REMOVAL N/A 11/12/2019   Procedure: PORTA CATH REMOVAL;  Surgeon: Algernon Huxley, MD;  Location: [place] CV LAB;  Service: Cardiovascular;  Laterality: N/A;   TONSILLECTOMY AND ADENOIDECTOMY  1947   Family History  Problem Relation  Age of Onset   Hypertension Mother    Coronary artery disease Mother    Heart disease Mother    Kidney disease Mother    Diabetes Mother    Cancer Mother        uterine cancer dx 61s   Diabetes Father    Kidney failure Father    Diabetes Brother    Coronary artery disease Brother    Cancer Brother        bladder cancer   Stroke Sister    Heart disease Sister        Pace Maker   Cancer Maternal Aunt 49       ovarian cancer   Breast cancer Cousin    Cancer Brother        Prostate   Social History   Socioeconomic History   Marital status: Widowed    Spouse name: Not on file   Number of children: 0   Years of education: Not on file   Highest education level: Not on file  Occupational History   Not on file  Tobacco Use   Smoking status: Never   Smokeless tobacco: Never   Vaping Use   Vaping Use: Never used  Substance and Sexual Activity   Alcohol use: No    Alcohol/week: 0.0 standard drinks   Drug use: No   Sexual activity: Not Currently  Other Topics Concern   Not on file  Social History Narrative   Lives at home alone with dog   Social Determinants of Health   Financial Resource Strain: Low Risk    Difficulty of Paying Living Expenses: Not hard at all  Food Insecurity: No Food Insecurity   Worried About Charity fundraiser in the Last Year: Never true   [place] in the Last Year: Never true  Transportation Needs: No Transportation Needs   Lack of Transportation (Medical): No   Lack of Transportation (Non-Medical): No  Physical Activity: Not on file  Stress: No Stress Concern Present   Feeling of Stress : Not at all  Social Connections: Unknown   Frequency of Communication with Friends and Family: More than three times a week   Frequency of Social Gatherings with Friends and Family: Not on file   Attends Religious Services: Not on file   Active Member of Clubs or Organizations: Not on file   Attends Archivist Meetings: Not on file   Marital Status: Not on file     Review of Systems  Constitutional:  Negative for appetite change and unexpected weight change.  HENT:  Negative for congestion and sinus pressure.   Respiratory:  Negative for cough, chest tightness and shortness of breath.   Cardiovascular:  Negative for chest pain, palpitations and leg swelling.  Gastrointestinal:  Negative for abdominal pain, diarrhea, nausea and vomiting.  Genitourinary:  Negative for difficulty urinating and dysuria.  Musculoskeletal:  Negative for myalgias.       Right knee pain as outlined.   Skin:  Negative for color change and rash.  Neurological:  Negative for dizziness, light-headedness and headaches.  Psychiatric/Behavioral:  Negative for agitation and dysphoric mood.       Objective:     BP 128/70   Pulse 79   Temp 97.6  F (36.4 C)   Resp 16   Ht 5\' 3"  (1.6 m)   Wt 172 lb 6.4 oz (78.2 kg)   SpO2 98%   BMI 30.54 kg/m  Wt Readings from Last 3  Encounters:  03/09/21 172 lb 6.4 oz (78.2 kg)  02/22/21 170 lb 8.4 oz (77.3 kg)  12/06/20 170 lb 6.4 oz (77.3 kg)    Physical Exam Vitals reviewed.  Constitutional:      General: She is not in acute distress.    Appearance: Normal appearance.  HENT:     Head: Normocephalic and atraumatic.     Right Ear: External ear normal.     Left Ear: External ear normal.  Eyes:     General: No scleral icterus.       Right eye: No discharge.        Left eye: No discharge.     Conjunctiva/sclera: Conjunctivae normal.  Neck:     Thyroid: No thyromegaly.  Cardiovascular:     Rate and Rhythm: Normal rate and regular rhythm.  Pulmonary:     Effort: No respiratory distress.     Breath sounds: Normal breath sounds. No wheezing.  Abdominal:     General: Bowel sounds are normal.     Palpations: Abdomen is soft.     Tenderness: There is no abdominal tenderness.  Musculoskeletal:        General: No swelling or tenderness.     Cervical back: Neck supple. No tenderness.  Lymphadenopathy:     Cervical: No cervical adenopathy.  Skin:    Findings: No erythema or rash.  Neurological:     Mental Status: She is alert.  Psychiatric:        Mood and Affect: Mood normal.        Behavior: Behavior normal.     Outpatient Encounter Medications as of 03/09/2021  Medication Sig   amLODipine (NORVASC) 2.5 MG tablet Take 1 tablet (2.5 mg total) by mouth daily.   Calcium Carbonate-Vitamin D 600-400 MG-UNIT tablet Take 1 tablet by mouth daily.    Multiple Vitamins-Minerals (MULTIVITAMIN WITH MINERALS) tablet Take 1 tablet by mouth daily.   [DISCONTINUED] tamoxifen (NOLVADEX) 20 MG tablet Take 1 tablet (20 mg total) by mouth daily.   Facility-Administered Encounter Medications as of 03/09/2021  Medication   lidocaine-prilocaine (EMLA) cream   sodium chloride 0.9 % 50 mL with  famotidine (PEPCID) 20 mg infusion   sodium chloride flush (NS) 0.9 % injection 10 mL     Lab Results  Component Value Date   WBC 8.4 03/09/2021   HGB 12.5 03/09/2021   HCT 38.5 03/09/2021   PLT 244.0 03/09/2021   GLUCOSE 88 02/22/2021   CHOL 146 03/09/2021   TRIG 169.0 (H) 03/09/2021   HDL 55.00 03/09/2021   LDLCALC 57 03/09/2021   ALT 18 02/22/2021   AST 23 02/22/2021   NA 135 02/22/2021   K 4.1 02/22/2021   CL 103 02/22/2021   CREATININE 0.80 02/22/2021   BUN 21 02/22/2021   CO2 28 02/22/2021   TSH 2.41 12/06/2020   INR 1.0 05/21/2012    MM 3D SCREEN BREAST BILATERAL  Result Date: 02/20/2021 CLINICAL DATA:  Screening. EXAM: DIGITAL SCREENING BILATERAL MAMMOGRAM WITH TOMOSYNTHESIS AND CAD TECHNIQUE: Bilateral screening digital craniocaudal and mediolateral oblique mammograms were obtained. Bilateral screening digital breast tomosynthesis was performed. The images were evaluated with computer-aided detection. COMPARISON:  Previous exam(s). ACR Breast Density Category c: The breast tissue is heterogeneously dense, which may obscure small masses. FINDINGS: There are no findings suspicious for malignancy. IMPRESSION: No mammographic evidence of malignancy. A result letter of this screening mammogram will be mailed directly to the patient. RECOMMENDATION: Screening mammogram in one year. (Code:SM-B-01Y) BI-RADS CATEGORY  1: Negative. Electronically Signed   By: Claudie Revering M.D.   On: 02/20/2021 08:53      Assessment & Plan:   Problem List Items Addressed This Visit     Anemia    Follow cbc.       Relevant Orders   CBC with Differential/Platelet (Completed)   Vitamin B12 (Completed)   IBC + Ferritin (Completed)   Carcinoma of upper-outer quadrant of right breast in female, estrogen receptor positive ([place])    Continue tamoxifen.  Followed by oncology.  Mammogram 02/16/21 - Birads I.       Chemotherapy-induced neuropathy ([place])    Documented - oncology.  Stable.        Hypercholesterolemia - Primary    Low cholesterol diet and exercise.  Follow lipid panel.       Relevant Medications   amLODipine (NORVASC) 2.5 MG tablet   Other Relevant Orders   Lipid panel (Completed)   Knee pain    Persistent knee pain as outlined.  Request referral to Dr Truddie Coco.        Relevant Orders   Ambulatory referral to Orthopedic Surgery   Osteoporosis    Receiving prolia.        Vitamin D deficiency    12/06/20 - vitamin D level 28.  Continue vitamin D supplements.         Einar Pheasant, MD

## 2021-03-14 ENCOUNTER — Other Ambulatory Visit: Payer: Self-pay

## 2021-03-14 ENCOUNTER — Ambulatory Visit (INDEPENDENT_AMBULATORY_CARE_PROVIDER_SITE_OTHER): Payer: Medicare HMO

## 2021-03-14 DIAGNOSIS — E538 Deficiency of other specified B group vitamins: Secondary | ICD-10-CM

## 2021-03-14 MED ORDER — CYANOCOBALAMIN 1000 MCG/ML IJ SOLN
1000.0000 ug | Freq: Once | INTRAMUSCULAR | Status: AC
  Administered 2021-03-14: 1000 ug via INTRAMUSCULAR

## 2021-03-14 NOTE — Progress Notes (Signed)
Patient presented for B 12 injection to left deltoid, patient voiced no concerns nor showed any signs of distress during injection.  Pt first b12 injection. For order, see lab results from 03/09/21.

## 2021-03-16 ENCOUNTER — Other Ambulatory Visit: Payer: Self-pay | Admitting: Oncology

## 2021-03-16 DIAGNOSIS — C50411 Malignant neoplasm of upper-outer quadrant of right female breast: Secondary | ICD-10-CM

## 2021-03-18 ENCOUNTER — Encounter: Payer: Self-pay | Admitting: Internal Medicine

## 2021-03-18 NOTE — Assessment & Plan Note (Signed)
Persistent knee pain as outlined.  Request referral to Dr Truddie Coco.

## 2021-03-18 NOTE — Assessment & Plan Note (Signed)
Documented - oncology.  Stable.  

## 2021-03-18 NOTE — Assessment & Plan Note (Signed)
Receiving prolia.  

## 2021-03-18 NOTE — Assessment & Plan Note (Signed)
12/06/20 - vitamin D level 28.  Continue vitamin D supplements.

## 2021-03-18 NOTE — Assessment & Plan Note (Signed)
Follow cbc.  

## 2021-03-18 NOTE — Assessment & Plan Note (Signed)
Continue tamoxifen.  Followed by oncology.  Mammogram 02/16/21 - Birads I.  

## 2021-03-18 NOTE — Assessment & Plan Note (Signed)
Low cholesterol diet and exercise.  Follow lipid panel.   

## 2021-03-20 ENCOUNTER — Encounter: Payer: Self-pay | Admitting: Hematology and Oncology

## 2021-03-21 ENCOUNTER — Other Ambulatory Visit: Payer: Self-pay

## 2021-03-21 ENCOUNTER — Ambulatory Visit (INDEPENDENT_AMBULATORY_CARE_PROVIDER_SITE_OTHER): Payer: Medicare HMO

## 2021-03-21 DIAGNOSIS — E538 Deficiency of other specified B group vitamins: Secondary | ICD-10-CM | POA: Diagnosis not present

## 2021-03-21 MED ORDER — CYANOCOBALAMIN 1000 MCG/ML IJ SOLN
1000.0000 ug | Freq: Once | INTRAMUSCULAR | Status: AC
  Administered 2021-03-21: 1000 ug via INTRAMUSCULAR

## 2021-03-21 NOTE — Progress Notes (Signed)
Patient presented for B 12 injection to left deltoid, patient voiced no concerns nor showed any signs of distress during injection. 

## 2021-03-22 ENCOUNTER — Telehealth: Payer: Self-pay | Admitting: Internal Medicine

## 2021-03-22 NOTE — Telephone Encounter (Signed)
Rejection Reason - Other - Patient is already establish at the [facility] office, she will call us back if she wants to transfer care" Darrick Huntsman said on Mar 22, 2021 9:47 AM  Msg from [facility]

## 2021-03-28 ENCOUNTER — Ambulatory Visit (INDEPENDENT_AMBULATORY_CARE_PROVIDER_SITE_OTHER): Payer: Medicare HMO

## 2021-03-28 ENCOUNTER — Other Ambulatory Visit: Payer: Self-pay

## 2021-03-28 DIAGNOSIS — E538 Deficiency of other specified B group vitamins: Secondary | ICD-10-CM

## 2021-03-28 MED ORDER — CYANOCOBALAMIN 1000 MCG/ML IJ SOLN
1000.0000 ug | Freq: Once | INTRAMUSCULAR | Status: AC
  Administered 2021-03-28: 1000 ug via INTRAMUSCULAR

## 2021-03-28 NOTE — Progress Notes (Signed)
Patient presented for B 12 injection to left deltoid, patient voiced no concerns nor showed any signs of distress during injection. 

## 2021-03-30 ENCOUNTER — Telehealth: Payer: Self-pay | Admitting: Internal Medicine

## 2021-03-30 NOTE — Telephone Encounter (Signed)
LMTCB

## 2021-03-30 NOTE — Telephone Encounter (Signed)
Patient is calling in to speak with you,asked patient what the call is in reference to and patient did not specify.Please call her at [phone].

## 2021-03-31 NOTE — Telephone Encounter (Signed)
Patient is returning your call,please call her at [phone].

## 2021-03-31 NOTE — Telephone Encounter (Signed)
Patient wanted to let me know that she decided not to go to [facility] in [place]. She is going to return to the one in [place]. Will call if she needs anything./

## 2021-04-04 ENCOUNTER — Ambulatory Visit (INDEPENDENT_AMBULATORY_CARE_PROVIDER_SITE_OTHER): Payer: Medicare HMO

## 2021-04-04 ENCOUNTER — Other Ambulatory Visit: Payer: Self-pay

## 2021-04-04 DIAGNOSIS — E538 Deficiency of other specified B group vitamins: Secondary | ICD-10-CM | POA: Diagnosis not present

## 2021-04-04 MED ORDER — CYANOCOBALAMIN 1000 MCG/ML IJ SOLN
1000.0000 ug | Freq: Once | INTRAMUSCULAR | Status: AC
  Administered 2021-04-04: 1000 ug via INTRAMUSCULAR

## 2021-04-04 NOTE — Progress Notes (Signed)
Patient presented for B 12 injection to right deltoid, patient voiced no concerns nor showed any signs of distress during injection. 

## 2021-04-06 ENCOUNTER — Ambulatory Visit
Admission: RE | Admit: 2021-04-06 | Discharge: 2021-04-06 | Disposition: A | Discharge status: Home / Self Care | Payer: Medicare HMO | Source: Ambulatory Visit | Attending: Oncology | Admitting: Oncology

## 2021-04-06 ENCOUNTER — Other Ambulatory Visit: Payer: Self-pay

## 2021-04-06 DIAGNOSIS — Z79810 Long term (current) use of selective estrogen receptor modulators (SERMs): Secondary | ICD-10-CM | POA: Diagnosis present

## 2021-04-06 DIAGNOSIS — M818 Other osteoporosis without current pathological fracture: Secondary | ICD-10-CM | POA: Insufficient documentation

## 2021-04-06 DIAGNOSIS — Z5181 Encounter for therapeutic drug level monitoring: Secondary | ICD-10-CM | POA: Diagnosis not present

## 2021-04-19 ENCOUNTER — Other Ambulatory Visit: Payer: Self-pay | Admitting: Oncology

## 2021-04-19 DIAGNOSIS — Z170 Estrogen receptor positive status [ER+]: Secondary | ICD-10-CM

## 2021-04-19 DIAGNOSIS — C50411 Malignant neoplasm of upper-outer quadrant of right female breast: Secondary | ICD-10-CM

## 2021-05-09 ENCOUNTER — Ambulatory Visit (INDEPENDENT_AMBULATORY_CARE_PROVIDER_SITE_OTHER): Payer: Medicare HMO

## 2021-05-09 ENCOUNTER — Other Ambulatory Visit: Payer: Self-pay

## 2021-05-09 DIAGNOSIS — E538 Deficiency of other specified B group vitamins: Secondary | ICD-10-CM | POA: Diagnosis not present

## 2021-05-09 MED ORDER — CYANOCOBALAMIN 1000 MCG/ML IJ SOLN
1000.0000 ug | Freq: Once | INTRAMUSCULAR | Status: AC
  Administered 2021-05-09: 1000 ug via INTRAMUSCULAR

## 2021-05-09 NOTE — Progress Notes (Signed)
Patient presented for B 12 injection to left deltoid, patient voiced no concerns nor showed any signs of distress during injection. 

## 2021-06-01 ENCOUNTER — Other Ambulatory Visit: Payer: Self-pay | Admitting: Internal Medicine

## 2021-06-07 ENCOUNTER — Other Ambulatory Visit: Payer: Self-pay

## 2021-06-07 ENCOUNTER — Ambulatory Visit (INDEPENDENT_AMBULATORY_CARE_PROVIDER_SITE_OTHER): Payer: Medicare HMO | Admitting: Internal Medicine

## 2021-06-07 VITALS — BP 122/76 | HR 68 | Temp 97.9°F | Resp 16 | Ht 63.0 in | Wt 171.0 lb

## 2021-06-07 DIAGNOSIS — E7800 Pure hypercholesterolemia, unspecified: Secondary | ICD-10-CM

## 2021-06-09 ENCOUNTER — Other Ambulatory Visit: Payer: Self-pay

## 2021-06-09 ENCOUNTER — Ambulatory Visit (INDEPENDENT_AMBULATORY_CARE_PROVIDER_SITE_OTHER): Payer: Medicare HMO

## 2021-06-09 DIAGNOSIS — E538 Deficiency of other specified B group vitamins: Secondary | ICD-10-CM | POA: Diagnosis not present

## 2021-06-09 MED ORDER — CYANOCOBALAMIN 1000 MCG/ML IJ SOLN
1000.0000 ug | Freq: Once | INTRAMUSCULAR | Status: AC
  Administered 2021-06-09: 10:00:00 1000 ug via INTRAMUSCULAR

## 2021-06-09 NOTE — Progress Notes (Signed)
[name] [name] presents today for injection per MD orders. B12 injection administered  in left Upper Arm. Administration without incident. Patient tolerated well. [name],cma

## 2021-06-11 ENCOUNTER — Encounter: Payer: Self-pay | Admitting: Internal Medicine

## 2021-06-11 NOTE — Progress Notes (Signed)
Pt was not seen.  Building had to be evacuated and appt had to be rescheduled.

## 2021-06-19 ENCOUNTER — Ambulatory Visit (INDEPENDENT_AMBULATORY_CARE_PROVIDER_SITE_OTHER): Payer: Medicare HMO | Admitting: Internal Medicine

## 2021-06-19 ENCOUNTER — Other Ambulatory Visit: Payer: Self-pay

## 2021-06-19 VITALS — BP 128/78 | HR 71 | Temp 97.8°F | Resp 16 | Ht 63.0 in | Wt 169.2 lb

## 2021-06-19 DIAGNOSIS — G620 Drug-induced polyneuropathy: Secondary | ICD-10-CM | POA: Diagnosis not present

## 2021-06-19 DIAGNOSIS — E559 Vitamin D deficiency, unspecified: Secondary | ICD-10-CM

## 2021-06-19 DIAGNOSIS — T451X5A Adverse effect of antineoplastic and immunosuppressive drugs, initial encounter: Secondary | ICD-10-CM

## 2021-06-19 DIAGNOSIS — E538 Deficiency of other specified B group vitamins: Secondary | ICD-10-CM | POA: Diagnosis not present

## 2021-06-19 DIAGNOSIS — M810 Age-related osteoporosis without current pathological fracture: Secondary | ICD-10-CM | POA: Diagnosis not present

## 2021-06-19 DIAGNOSIS — E7800 Pure hypercholesterolemia, unspecified: Secondary | ICD-10-CM

## 2021-06-19 DIAGNOSIS — I10 Essential (primary) hypertension: Secondary | ICD-10-CM

## 2021-06-19 DIAGNOSIS — C50411 Malignant neoplasm of upper-outer quadrant of right female breast: Secondary | ICD-10-CM

## 2021-06-19 DIAGNOSIS — Z170 Estrogen receptor positive status [ER+]: Secondary | ICD-10-CM

## 2021-06-19 DIAGNOSIS — M25561 Pain in right knee: Secondary | ICD-10-CM

## 2021-06-19 DIAGNOSIS — D649 Anemia, unspecified: Secondary | ICD-10-CM

## 2021-06-19 DIAGNOSIS — M25562 Pain in left knee: Secondary | ICD-10-CM

## 2021-06-19 LAB — BASIC METABOLIC PANEL
BUN: 12 mg/dL (ref 6–23)
CO2: 28 mEq/L (ref 19–32)
Calcium: 8.8 mg/dL (ref 8.4–10.5)
Chloride: 107 mEq/L (ref 96–112)
Creatinine, Ser: 0.61 mg/dL (ref 0.40–1.20)
GFR: 81.62 mL/min (ref >60.00)
Glucose, Bld: 84 mg/dL (ref 70–99)
Potassium: 3.9 mEq/L (ref 3.5–5.1)
Sodium: 141 mEq/L (ref 135–145)

## 2021-06-19 LAB — LIPID PANEL
Cholesterol: 144 mg/dL (ref 0–200)
HDL: 56 mg/dL (ref >39.00)
LDL Cholesterol: 58 mg/dL (ref 0–99)
NonHDL: 87.52
Total CHOL/HDL Ratio: 3
Triglycerides: 148 mg/dL (ref 0.0–149.0)
VLDL: 29.6 mg/dL (ref 0.0–40.0)

## 2021-06-19 LAB — HEPATIC FUNCTION PANEL
ALT: 15 U/L (ref 0–35)
AST: 17 U/L (ref 0–37)
Albumin: 3.6 g/dL (ref 3.5–5.2)
Alkaline Phosphatase: 43 U/L (ref 39–117)
Bilirubin, Direct: 0.1 mg/dL (ref 0.0–0.3)
Total Bilirubin: 0.4 mg/dL (ref 0.2–1.2)
Total Protein: 5.8 g/dL — ABNORMAL LOW (ref 6.0–8.3)

## 2021-06-19 LAB — VITAMIN B12: Vitamin B-12: 406 pg/mL (ref 211–911)

## 2021-06-19 NOTE — Progress Notes (Signed)
Patient ID: CECYLIA BRAZILL, female   DOB: [birth year], 88 y.o.   MRN: [id number]   Subjective:    Patient ID: [name], female    DOB: 1933/12/18, 88 y.o.   MRN: [id number]  This visit occurred during the SARS-CoV-2 public health emergency.  Safety protocols were in place, including screening questions prior to the visit, additional usage of staff PPE, and extensive cleaning of exam room while observing appropriate contact time as indicated for disinfecting solutions.   Patient here for a scheduled follow up.    Chief Complaint  Patient presents with   Hypertension   .   HPI Taking amlodipine.  Blood pressure on outside checks mostly 299M systolic readings.  She reports she feels she is doing relatively well.  No chest pain or sob reported.  No increased cough or congestion reported.  No acid reflux.  No abdominal pain or bowel change reported.  Was referred to ortho last visit for evaluation of her knee.  Takes meloxicam prn.  Does not feel needs any further intervention at this time.  Rarely takes meloxicam.  Discussed possible side effects and risk of medication.  Uses a cane to ambulate.     Past Medical History:  Diagnosis Date   Breast cancer ([place])    Diverticulitis of sigmoid colon    Endometriosis    Family history of breast cancer    Family history of ovarian cancer    Family history of prostate cancer    Family history of uterine cancer    Fibrocystic breast disease    Glaucoma    H/O urinary incontinence    History of chicken pox    History of colon polyps 2014   Hyperlipidemia    Hypertension    Ovarian cancer ([place]) 2002   Chemo tx's @ [facility] with Total Hysterectomy. Dr Amalia Hailey   Personal history of chemotherapy    ovarian cancer   Personal history of ovarian cancer    Personal history of radiation therapy    Port-A-Cath in place    Sinusitis    Tuberculosis    positve skin test   Past Surgical History:  Procedure Laterality Date   ABDOMINAL HYSTERECTOMY   2003   complete Dr Amalia Hailey   ABSCESS DRAINAGE  2017   right buttock at [facility]  2003   BREAST EXCISIONAL BIOPSY Left 1993   excisional bx. negative results   BREAST LUMPECTOMY Right 03/18/2018   done at [facility] breast ca rad and chemo Invasive ductal carcinoma with focal squamous differentiation, Nottingham grade 3, is identified forming a 6 mm mass, associated with high-grade ductal carcinoma in situ, radial scar and biopsy site changes.   BREAST SURGERY Left 1982   papilloma removal   COLONOSCOPY  2006, 2014   Dr Vira Agar    COLONOSCOPY N/A 03/27/2016   Procedure: COLONOSCOPY;  Surgeon: Robert Bellow, MD;  Location: [facility] ENDOSCOPY;  Service: Endoscopy;  Laterality: N/A;   COLOSTOMY REVERSAL  2014   at [place] Dr Raynaldo Opitz   NASAL SINUS SURGERY     For fungal infection   NASAL SINUS SURGERY  8/05 8/06   OOPHORECTOMY     OSTOMY  05/22/2012   diverticulitis with abcess/ Dr Tamala Julian   ovarian cancer     Exploratory lap   PORTA CATH INSERTION N/A 05/26/2018   Procedure: PORTA CATH INSERTION;  Surgeon: Algernon Huxley, MD;  Location: [place] [facility];  Service: Cardiovascular;  Laterality: N/A;  PORTA CATH REMOVAL N/A 11/12/2019   Procedure: PORTA CATH REMOVAL;  Surgeon: Algernon Huxley, MD;  Location: [place] [facility];  Service: Cardiovascular;  Laterality: N/A;   TONSILLECTOMY AND ADENOIDECTOMY  1947   Family History  Problem Relation Age of Onset   Hypertension Mother    Coronary artery disease Mother    Heart disease Mother    Kidney disease Mother    Diabetes Mother    Cancer Mother        uterine cancer dx 31s   Diabetes Father    Kidney failure Father    Diabetes Brother    Coronary artery disease Brother    Cancer Brother        bladder cancer   Stroke Sister    Heart disease Sister        Pace Maker   Cancer Maternal Aunt 49       ovarian cancer   Breast cancer Cousin    Cancer Brother        Prostate   Social History   Socioeconomic History    Marital status: Widowed    Spouse name: Not on file   Number of children: 0   Years of education: Not on file   Highest education level: Not on file  Occupational History   Not on file  Tobacco Use   Smoking status: Never   Smokeless tobacco: Never  Vaping Use   Vaping Use: Never used  Substance and Sexual Activity   Alcohol use: No    Alcohol/week: 0.0 standard drinks   Drug use: No   Sexual activity: Not Currently  Other Topics Concern   Not on file  Social History Narrative   Lives at home alone with dog   Social Determinants of Health   Financial Resource Strain: Low Risk    Difficulty of Paying Living Expenses: Not hard at all  Food Insecurity: No Food Insecurity   Worried About Charity fundraiser in the Last Year: Never true   [place] in the Last Year: Never true  Transportation Needs: No Transportation Needs   Lack of Transportation (Medical): No   Lack of Transportation (Non-Medical): No  Physical Activity: Not on file  Stress: No Stress Concern Present   Feeling of Stress : Not at all  Social Connections: Unknown   Frequency of Communication with Friends and Family: More than three times a week   Frequency of Social Gatherings with Friends and Family: Not on file   Attends Religious Services: Not on file   Active Member of Clubs or Organizations: Not on file   Attends Archivist Meetings: Not on file   Marital Status: Not on file     Review of Systems  Constitutional:  Negative for appetite change and unexpected weight change.  HENT:  Negative for congestion and sinus pressure.   Respiratory:  Negative for cough, chest tightness and shortness of breath.   Cardiovascular:  Negative for chest pain, palpitations and leg swelling.  Gastrointestinal:  Negative for abdominal pain, diarrhea, nausea and vomiting.  Genitourinary:  Negative for difficulty urinating and dysuria.  Musculoskeletal:  Negative for myalgias.       Knee pain as  outlined.    Skin:  Negative for color change and rash.  Neurological:  Negative for dizziness, light-headedness and headaches.  Psychiatric/Behavioral:  Negative for agitation and dysphoric mood.       Objective:     BP 128/78  Pulse 71    Temp 97.8 F (36.6 C)    Resp 16    Ht 5\' 3"  (1.6 m)    Wt 169 lb 3.2 oz (76.7 kg)    SpO2 98%    BMI 29.97 kg/m  Wt Readings from Last 3 Encounters:  06/19/21 169 lb 3.2 oz (76.7 kg)  06/07/21 171 lb (77.6 kg)  03/09/21 172 lb 6.4 oz (78.2 kg)    Physical Exam Vitals reviewed.  Constitutional:      General: She is not in acute distress.    Appearance: Normal appearance.  HENT:     Head: Normocephalic and atraumatic.     Right Ear: External ear normal.     Left Ear: External ear normal.  Eyes:     General: No scleral icterus.       Right eye: No discharge.        Left eye: No discharge.     Conjunctiva/sclera: Conjunctivae normal.  Neck:     Thyroid: No thyromegaly.  Cardiovascular:     Rate and Rhythm: Normal rate and regular rhythm.  Pulmonary:     Effort: No respiratory distress.     Breath sounds: Normal breath sounds. No wheezing.  Abdominal:     General: Bowel sounds are normal.     Palpations: Abdomen is soft.     Tenderness: There is no abdominal tenderness.  Musculoskeletal:        General: No swelling or tenderness.     Cervical back: Neck supple. No tenderness.  Lymphadenopathy:     Cervical: No cervical adenopathy.  Skin:    Findings: No erythema or rash.  Neurological:     Mental Status: She is alert.  Psychiatric:        Mood and Affect: Mood normal.        Behavior: Behavior normal.     Outpatient Encounter Medications as of 06/19/2021  Medication Sig   amLODipine (NORVASC) 2.5 MG tablet TAKE 1 TABLET BY MOUTH EVERY DAY   Calcium Carbonate-Vitamin D 600-400 MG-UNIT tablet Take 1 tablet by mouth daily.    meloxicam (MOBIC) 7.5 MG tablet Take 7.5 mg by mouth daily as needed.   Multiple Vitamins-Minerals  (MULTIVITAMIN WITH MINERALS) tablet Take 1 tablet by mouth daily.   tamoxifen (NOLVADEX) 20 MG tablet TAKE 1 TABLET BY MOUTH EVERY DAY   Facility-Administered Encounter Medications as of 06/19/2021  Medication   lidocaine-prilocaine (EMLA) cream   sodium chloride 0.9 % 50 mL with famotidine (PEPCID) 20 mg infusion   sodium chloride flush (NS) 0.9 % injection 10 mL     Lab Results  Component Value Date   WBC 8.4 03/09/2021   HGB 12.5 03/09/2021   HCT 38.5 03/09/2021   PLT 244.0 03/09/2021   GLUCOSE 84 06/19/2021   CHOL 144 06/19/2021   TRIG 148.0 06/19/2021   HDL 56.00 06/19/2021   LDLCALC 58 06/19/2021   ALT 15 06/19/2021   AST 17 06/19/2021   NA 141 06/19/2021   K 3.9 06/19/2021   CL 107 06/19/2021   CREATININE 0.61 06/19/2021   BUN 12 06/19/2021   CO2 28 06/19/2021   TSH 2.41 12/06/2020   INR 1.0 05/21/2012    DG Bone Density  Result Date: 04/06/2021 EXAM: DUAL X-RAY ABSORPTIOMETRY (DXA) FOR BONE MINERAL DENSITY IMPRESSION: Your patient Earlisha Sharples completed a BMD test on 04/06/2021 using the [place] (software version: 14.10) manufactured by [employer]. The following summarizes the results of  our evaluation. Technologist: crr PATIENT BIOGRAPHICAL: Name: Tanisa, Lagace Patient ID: [id number] Birth Date: 07-30-1933 Height: 62.0 in. Gender: Female Exam Date: 04/06/2021 Weight: 169.4 lbs. Indications: Advanced Age, Caucasian, Height Loss, High Risk Meds, History of Breast Cancer, History of Chemo, History of Ovarian Cancer, History of Radiation, Hysterectomy, Oophorectomy Bilateral, Osteoarthritis, Postmenopausal Fractures: Treatments: calcium w/ vit D, Multi-Vitamin, Prolia, Tamoxifen DENSITOMETRY RESULTS: Site         Region     Measured Date Measured Age WHO Classification Young Adult T-score BMD         %Change vs. Previous Significant Change (*) DualFemur Neck Left 04/06/20[age].0 Osteopenia -2.4 0.707 g/cm2 7.8% - DualFemur Neck Left 04/06/2019  83.0 Osteoporosis -2.7 0.656 g/cm2 -4.1% - DualFemur Neck Left 04/01/2017 81.0 Osteoporosis -2.5 0.684 g/cm2 - - DualFemur Total Mean 04/06/2021 85.0 Osteopenia -1.8 0.778 g/cm2 3.2% Yes DualFemur Total Mean 04/06/2019 83.0 Osteopenia -2.0 0.754 g/cm2 -3.8% Yes DualFemur Total Mean 04/01/2017 81.0 Osteopenia -1.8 0.784 g/cm2 - - Left Forearm Radius 33% 04/06/2021 85.0 Osteopenia -1.9 0.708 g/cm2 -1.1% - Left Forearm Radius 33% 04/06/2019 83.0 Osteopenia -1.8 0.716 g/cm2 -2.3% - Left Forearm Radius 33% 04/01/2017 81.0 Osteopenia -1.6 0.733 g/cm2 - - ASSESSMENT: The BMD measured at Femur Neck Left is 0.707 g/cm2 with a T-score of -2.4. This patient is considered osteopenic according to [facility]) criteria. Lumbar spine was not utilized due to advanced degenerative changes. The scan quality is limited by exclusion of L-spine. Patient is not a candidate for FRAX assessment due to Prolia use. World Pharmacologist [facility]) criteria for post-menopausal, Caucasian Women: Normal:                   T-score at or above -1 SD Osteopenia/low bone mass: T-score between -1 and -2.5 SD Osteoporosis:             T-score at or below -2.5 SD RECOMMENDATIONS: 1. All patients should optimize calcium and vitamin D intake. 2. Consider FDA-approved medical therapies in postmenopausal women and men aged 18 years and older, based on the following: a. A hip or vertebral(clinical or morphometric) fracture b. T-score < -2.5 at the femoral neck or spine after appropriate evaluation to exclude secondary causes c. Low bone mass (T-score between -1.0 and -2.5 at the femoral neck or spine) and a 10-year probability of a hip fracture > 3% or a 10-year probability of a major osteoporosis-related fracture > 20% based on the [place]-adapted WHO algorithm 3. Clinician judgment and/or patient preferences may indicate treatment for people with 10-year fracture probabilities above or below these levels FOLLOW-UP: People with diagnosed  cases of osteoporosis or at high risk for fracture should have regular bone mineral density tests. For patients eligible for Medicare, routine testing is allowed once every 2 years. The testing frequency can be increased to one year for patients who have rapidly progressing disease, those who are receiving or discontinuing medical therapy to restore bone mass, or have additional risk factors. I have reviewed this report, and agree with the above findings. [facility] Electronically Signed   By: Rolm Baptise M.D.   On: 04/06/2021 20:51       Assessment & Plan:   Problem List Items Addressed This Visit     Anemia    Followed by oncology.  They follow cbc.       B12 deficiency    Recheck B12 level today.        Relevant Orders   Vitamin B12 (  Completed)   Carcinoma of upper-outer quadrant of right breast in female, estrogen receptor positive ([place])    Continue tamoxifen.  Followed by oncology.  Mammogram 02/16/21 - [facility].       Chemotherapy-induced neuropathy ([place])    Documented - oncology.  Stable.       Hypercholesterolemia - Primary    Low cholesterol diet and exercise.  Follow lipid panel.       Relevant Orders   Basic metabolic panel (Completed)   Hepatic function panel (Completed)   Lipid panel (Completed)   Hypertension, essential    On amlodipine 2.5mg  q day.  Blood pressure as outlined.  Follow pressures.  Follow metabolic panel.       Knee pain    Does not feel needs any further evaluation at this time.  Has meloxicam.  Rarely takes.  Uses a cane.  Follow.        Osteoporosis    Due to receive prolia in 07/2021.       Vitamin D deficiency    Recheck vitamin D level.          Einar Pheasant, MD

## 2021-06-20 ENCOUNTER — Telehealth: Payer: Self-pay | Admitting: Internal Medicine

## 2021-06-20 ENCOUNTER — Encounter: Payer: Self-pay | Admitting: Internal Medicine

## 2021-06-20 ENCOUNTER — Other Ambulatory Visit (INDEPENDENT_AMBULATORY_CARE_PROVIDER_SITE_OTHER): Payer: Medicare HMO

## 2021-06-20 DIAGNOSIS — E559 Vitamin D deficiency, unspecified: Secondary | ICD-10-CM | POA: Diagnosis not present

## 2021-06-20 DIAGNOSIS — I10 Essential (primary) hypertension: Secondary | ICD-10-CM | POA: Insufficient documentation

## 2021-06-20 LAB — VITAMIN D 25 HYDROXY (VIT D DEFICIENCY, FRACTURES): VITD: 31.72 ng/mL (ref 30.00–100.00)

## 2021-06-20 NOTE — Assessment & Plan Note (Signed)
Recheck vitamin D level 

## 2021-06-20 NOTE — Assessment & Plan Note (Signed)
Followed by oncology.  They follow cbc.  

## 2021-06-20 NOTE — Assessment & Plan Note (Signed)
Due to receive prolia in 07/2021.

## 2021-06-20 NOTE — Assessment & Plan Note (Signed)
On amlodipine 2.5mg q day.  Blood pressure as outlined.  Follow pressures.  Follow metabolic panel.  

## 2021-06-20 NOTE — Telephone Encounter (Signed)
[name] [name] Key: [id number] help? Call us at [phone] Outcome Additional Information Required The patient currently has access to the requested medication and a Prior Authorization is not needed for the patient/medication.

## 2021-06-20 NOTE — Assessment & Plan Note (Signed)
Documented - oncology.  Stable.  

## 2021-06-20 NOTE — Assessment & Plan Note (Signed)
Continue tamoxifen.  Followed by oncology.  Mammogram 02/16/21 - Birads I.  

## 2021-06-20 NOTE — Assessment & Plan Note (Signed)
Low cholesterol diet and exercise.  Follow lipid panel.   

## 2021-06-20 NOTE — Assessment & Plan Note (Signed)
Does not feel needs any further evaluation at this time.  Has meloxicam.  Rarely takes.  Uses a cane.  Follow.

## 2021-06-20 NOTE — Assessment & Plan Note (Signed)
Recheck B12 level today

## 2021-06-20 NOTE — Telephone Encounter (Signed)
She is due for prolia injection next month.  Was not sure if there was anything I needed to do prior to her coming in.  Last prolia - 01/2021.

## 2021-06-28 NOTE — Telephone Encounter (Signed)
Patient scheduled for Prolia

## 2021-07-11 ENCOUNTER — Other Ambulatory Visit: Payer: Self-pay

## 2021-07-11 ENCOUNTER — Ambulatory Visit (INDEPENDENT_AMBULATORY_CARE_PROVIDER_SITE_OTHER): Payer: Medicare HMO

## 2021-07-11 DIAGNOSIS — E538 Deficiency of other specified B group vitamins: Secondary | ICD-10-CM

## 2021-07-11 MED ORDER — CYANOCOBALAMIN 1000 MCG/ML IJ SOLN
1000.0000 ug | Freq: Once | INTRAMUSCULAR | Status: AC
  Administered 2021-07-11: 1000 ug via INTRAMUSCULAR

## 2021-07-11 NOTE — Progress Notes (Signed)
Pt presented today for b12 injection. Left deltoid, IM. Pt voiced no concerns nor showed any signs of distress during injection. 

## 2021-07-31 ENCOUNTER — Other Ambulatory Visit: Payer: Self-pay

## 2021-07-31 ENCOUNTER — Ambulatory Visit (INDEPENDENT_AMBULATORY_CARE_PROVIDER_SITE_OTHER): Payer: Medicare HMO | Admitting: *Deleted

## 2021-07-31 DIAGNOSIS — M810 Age-related osteoporosis without current pathological fracture: Secondary | ICD-10-CM

## 2021-07-31 MED ORDER — DENOSUMAB 60 MG/ML ~~LOC~~ SOSY
60.0000 mg | PREFILLED_SYRINGE | Freq: Once | SUBCUTANEOUS | Status: AC
  Administered 2021-07-31: 60 mg via SUBCUTANEOUS

## 2021-07-31 NOTE — Progress Notes (Addendum)
Pt arrived for 6 month Prolia injection, given in L arm SQ. Pt tolerated injection well, showed no signs of distress nor voiced any concerns.

## 2021-08-09 ENCOUNTER — Ambulatory Visit (INDEPENDENT_AMBULATORY_CARE_PROVIDER_SITE_OTHER): Payer: Medicare HMO | Admitting: *Deleted

## 2021-08-09 ENCOUNTER — Other Ambulatory Visit: Payer: Self-pay

## 2021-08-09 DIAGNOSIS — E538 Deficiency of other specified B group vitamins: Secondary | ICD-10-CM | POA: Diagnosis not present

## 2021-08-09 MED ORDER — CYANOCOBALAMIN 1000 MCG/ML IJ SOLN
1000.0000 ug | Freq: Once | INTRAMUSCULAR | Status: AC
  Administered 2021-08-09: 1000 ug via INTRAMUSCULAR

## 2021-08-09 NOTE — Progress Notes (Signed)
Pt arrived for B12 injection, given in L deltoid. Pt tolerated injection well, showed no signs of distress nor voiced any concerns.  ?

## 2021-08-23 ENCOUNTER — Other Ambulatory Visit: Payer: Self-pay

## 2021-08-23 ENCOUNTER — Encounter: Payer: Self-pay | Admitting: Oncology

## 2021-08-23 ENCOUNTER — Inpatient Hospital Stay: Payer: Medicare HMO | Attending: Oncology | Admitting: Oncology

## 2021-08-23 VITALS — BP 132/79 | HR 65 | Temp 97.7°F | Resp 18 | Wt 174.0 lb

## 2021-08-23 DIAGNOSIS — Z170 Estrogen receptor positive status [ER+]: Secondary | ICD-10-CM | POA: Insufficient documentation

## 2021-08-23 DIAGNOSIS — Z923 Personal history of irradiation: Secondary | ICD-10-CM | POA: Insufficient documentation

## 2021-08-23 DIAGNOSIS — C50411 Malignant neoplasm of upper-outer quadrant of right female breast: Secondary | ICD-10-CM | POA: Diagnosis present

## 2021-08-23 DIAGNOSIS — Z8543 Personal history of malignant neoplasm of ovary: Secondary | ICD-10-CM | POA: Insufficient documentation

## 2021-08-23 DIAGNOSIS — M810 Age-related osteoporosis without current pathological fracture: Secondary | ICD-10-CM | POA: Insufficient documentation

## 2021-08-23 DIAGNOSIS — Z9221 Personal history of antineoplastic chemotherapy: Secondary | ICD-10-CM | POA: Diagnosis not present

## 2021-08-23 DIAGNOSIS — Z79810 Long term (current) use of selective estrogen receptor modulators (SERMs): Secondary | ICD-10-CM | POA: Insufficient documentation

## 2021-08-23 MED ORDER — TAMOXIFEN CITRATE 20 MG PO TABS: 20.0000 mg | ORAL_TABLET | Freq: Every day | ORAL | 1 refills | Status: DC

## 2021-08-23 NOTE — Progress Notes (Signed)
? ? ? ?Hematology/Oncology Consult note ?[place]  ?[phone] [phone] ? ?Patient Care Team: ?Einar Pheasant, MD as PCP - General (Internal Medicine) ?Leone Haven, MD as Consulting Physician (Family Medicine) ?Christene Lye, MD (General Surgery) ?Einar Pheasant, MD (Internal Medicine) ?Robert Bellow, MD (General Surgery) ?Noreene Filbert, MD as Referring Physician (Radiation Oncology) ?Schnier, Dolores Lory, MD as Consulting Physician (Vascular Surgery) ?Sindy Guadeloupe, MD as Consulting Physician (Hematology and Oncology)  ? ?Name of the patient: [name] [name]  ?[id number]  ?July 10, 1933  ? ?Date of visit: 08/23/21 ? ?Diagnosis-history of breast cancer ? ?Chief complaint/ Reason for visit-routine follow-up of breast cancer ? ?Heme/Onc history: Patient is a 88 year old female who was diagnosed with stage I HER2 positiveRight breast cancer in 2019 s/p lumpectomy and sentinel lymph node biopsy.  Final pathology showed 0.6 cm grade 3 invasive mammary carcinoma ER 99% positive PR 25% positive and HER2 positive by FISH.  She received 12 weeks of Herceptin and Taxol followed by weekly Herceptin for 1 year.  She also received adjuvant radiation treatment she is currently on tamoxifen for hormone therapy ? ?Also has a history of stage IIb ovarian cancer s/p exploratory laparotomy TAH/BSO omentectomy peritoneal biopsies and pelvic and periaortic lymph node sampling back in 2003.  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  She has also received adjuvant CarboTaxol for it back then.  Has baseline osteoporosis on bone density. ? ?Interval history-overall patient is doing well.  Denies any new concerns at this time.  Tolerating tamoxifen well.  Reports she continues normal baseline activities such as going to the grocery store and post office but otherwise is not very active.  She is able to do what she needs to each day. ? ?ECOG PS- 1 ?Pain  scale- 0 ? ? ?Review of systems- Review of Systems  ?Constitutional: Negative.  Negative for chills, fever, malaise/fatigue and weight loss.  ?HENT:  Negative for congestion, ear pain and tinnitus.   ?Eyes: Negative.  Negative for blurred vision and double vision.  ?Respiratory: Negative.  Negative for cough, sputum production and shortness of breath.   ?Cardiovascular: Negative.  Negative for chest pain, palpitations and leg swelling.  ?Gastrointestinal: Negative.  Negative for abdominal pain, constipation, diarrhea, nausea and vomiting.  ?Genitourinary:  Negative for dysuria, frequency and urgency.  ?Musculoskeletal:  Negative for back pain and falls.  ?Skin: Negative.  Negative for rash.  ?Neurological: Negative.  Negative for weakness and headaches.  ?Endo/Heme/Allergies: Negative.  Does not bruise/bleed easily.  ?Psychiatric/Behavioral: Negative.  Negative for depression. The patient is not nervous/anxious and does not have insomnia.    ? ? ? ?No Known Allergies ? ? ?Past Medical History:  ?Diagnosis Date  ? Breast cancer ([place])   ? Diverticulitis of sigmoid colon   ? Endometriosis   ? Family history of breast cancer   ? Family history of ovarian cancer   ? Family history of prostate cancer   ? Family history of uterine cancer   ? Fibrocystic breast disease   ? Glaucoma   ? H/O urinary incontinence   ? History of chicken pox   ? History of colon polyps 2014  ? Hyperlipidemia   ? Hypertension   ? Ovarian cancer ([place]) 2002  ? Chemo tx's @ [facility] with Total Hysterectomy. Dr Amalia Hailey  ? Personal history of chemotherapy   ? ovarian cancer  ? Personal history of ovarian cancer   ? Personal history of radiation therapy   ?  Port-A-Cath in place   ? Sinusitis   ? Tuberculosis   ? positve skin test  ? ? ? ?Past Surgical History:  ?Procedure Laterality Date  ? ABDOMINAL HYSTERECTOMY  2003  ? complete Dr Amalia Hailey  ? ABSCESS DRAINAGE  2017  ? [facility] buttock [facility]  ? APPENDECTOMY  2003  ? BREAST EXCISIONAL BIOPSY Left 1993  ?  excisional bx. negative results  ? BREAST LUMPECTOMY Right 03/18/2018  ? done at [facility] breast ca rad and chemo Invasive ductal carcinoma with focal squamous differentiation, Nottingham grade 3, is identified forming a 6 mm mass, associated with high-grade ductal carcinoma in situ, radial scar and biopsy site changes.  ? BREAST SURGERY Left 1982  ? papilloma removal  ? COLONOSCOPY  2006, 2014  ? Dr Vira Agar   ? COLONOSCOPY N/A 03/27/2016  ? Procedure: COLONOSCOPY;  Surgeon: Robert Bellow, MD;  Location: [facility] ENDOSCOPY;  Service: Endoscopy;  Laterality: N/A;  ? COLOSTOMY REVERSAL  2014  ? at [facility] Dr Raynaldo Opitz  ? NASAL SINUS SURGERY    ? For fungal infection  ? NASAL SINUS SURGERY  8/05 8/06  ? OOPHORECTOMY    ? OSTOMY  05/22/2012  ? diverticulitis with abcess/ Dr Tamala Julian  ? ovarian cancer    ? Exploratory lap  ? PORTA CATH INSERTION N/A 05/26/2018  ? Procedure: PORTA CATH INSERTION;  Surgeon: Algernon Huxley, MD;  Location: [place] [facility];  Service: Cardiovascular;  Laterality: N/A;  ? PORTA CATH REMOVAL N/A 11/12/2019  ? Procedure: PORTA CATH REMOVAL;  Surgeon: Algernon Huxley, MD;  Location: [place] [facility];  Service: Cardiovascular;  Laterality: N/A;  ? TONSILLECTOMY AND ADENOIDECTOMY  1947  ? ? ?Social History  ? ?Socioeconomic History  ? Marital status: Widowed  ?  Spouse name: Not on file  ? Number of children: 0  ? Years of education: Not on file  ? Highest education level: Not on file  ?Occupational History  ? Not on file  ?Tobacco Use  ? Smoking status: Never  ? Smokeless tobacco: Never  ?Vaping Use  ? Vaping Use: Never used  ?Substance and Sexual Activity  ? Alcohol use: No  ?  Alcohol/week: 0.0 standard drinks  ? Drug use: No  ? Sexual activity: Not Currently  ?Other Topics Concern  ? Not on file  ?Social History Narrative  ? Lives at home alone with dog  ? ?Social Determinants of Health  ? ?Financial Resource Strain: Low Risk   ? Difficulty of Paying Living Expenses: Not hard at all  ?Food  Insecurity: No Food Insecurity  ? Worried About Charity fundraiser in the Last Year: Never true  ? Ran Out of Food in the Last Year: Never true  ?Transportation Needs: No Transportation Needs  ? Lack of Transportation (Medical): No  ? Lack of Transportation (Non-Medical): No  ?Physical Activity: Not on file  ?Stress: No Stress Concern Present  ? Feeling of Stress : Not at all  ?Social Connections: Unknown  ? Frequency of Communication with Friends and Family: More than three times a week  ? Frequency of Social Gatherings with Friends and Family: Not on file  ? Attends Religious Services: Not on file  ? Active Member of Clubs or Organizations: Not on file  ? Attends Archivist Meetings: Not on file  ? Marital Status: Not on file  ?Intimate Partner Violence: Not At Risk  ? Fear of Current or Ex-Partner: No  ? Emotionally Abused:  No  ? Physically Abused: No  ? Sexually Abused: No  ? ? ?Family History  ?Problem Relation Age of Onset  ? Hypertension Mother   ? Coronary artery disease Mother   ? Heart disease Mother   ? Kidney disease Mother   ? Diabetes Mother   ? Cancer Mother   ?     uterine cancer dx 66s  ? Diabetes Father   ? Kidney failure Father   ? Diabetes Brother   ? Coronary artery disease Brother   ? Cancer Brother   ?     bladder cancer  ? Stroke Sister   ? Heart disease Sister   ?     Pace Maker  ? Cancer Maternal Aunt 49  ?     ovarian cancer  ? Breast cancer Cousin   ? Cancer Brother   ?     Prostate  ? ? ? ?Current Outpatient Medications:  ?  amLODipine (NORVASC) 2.5 MG tablet, TAKE 1 TABLET BY MOUTH EVERY DAY, Disp: 90 tablet, Rfl: 1 ?  Calcium Carbonate-Vitamin D 600-400 MG-UNIT tablet, Take 1 tablet by mouth daily. , Disp: , Rfl:  ?  meloxicam (MOBIC) 7.5 MG tablet, Take 7.5 mg by mouth daily as needed., Disp: , Rfl:  ?  Multiple Vitamins-Minerals (MULTIVITAMIN WITH MINERALS) tablet, Take 1 tablet by mouth daily., Disp: , Rfl:  ?  tamoxifen (NOLVADEX) 20 MG tablet, Take 1 tablet (20 mg  total) by mouth daily., Disp: 90 tablet, Rfl: 1 ? ?Current Facility-Administered Medications:  ?  lidocaine-prilocaine (EMLA) cream, , Topical, PRN, Dew, Erskine Squibb, MD ? ?Facility-Administered Medications Ordered in Other

## 2021-08-23 NOTE — Progress Notes (Signed)
Patient here for follow up. Pt request refill on tamoxifen  ?

## 2021-09-11 ENCOUNTER — Ambulatory Visit (INDEPENDENT_AMBULATORY_CARE_PROVIDER_SITE_OTHER): Payer: Medicare HMO

## 2021-09-11 DIAGNOSIS — E538 Deficiency of other specified B group vitamins: Secondary | ICD-10-CM | POA: Diagnosis not present

## 2021-09-11 MED ORDER — CYANOCOBALAMIN 1000 MCG/ML IJ SOLN
1000.0000 ug | Freq: Once | INTRAMUSCULAR | Status: AC
  Administered 2021-09-11: 1000 ug via INTRAMUSCULAR

## 2021-09-11 NOTE — Progress Notes (Signed)
Pt arrived for B12 injection, given in L deltoid. Pt tolerated injection well, showed no signs of distress nor voiced any concerns.  ?

## 2021-10-12 ENCOUNTER — Ambulatory Visit (INDEPENDENT_AMBULATORY_CARE_PROVIDER_SITE_OTHER): Payer: Medicare HMO

## 2021-10-12 DIAGNOSIS — E538 Deficiency of other specified B group vitamins: Secondary | ICD-10-CM | POA: Diagnosis not present

## 2021-10-12 MED ORDER — CYANOCOBALAMIN 1000 MCG/ML IJ SOLN
1000.0000 ug | Freq: Once | INTRAMUSCULAR | Status: AC
  Administered 2021-10-12: 1000 ug via INTRAMUSCULAR

## 2021-10-12 NOTE — Progress Notes (Signed)
Patient presented for B 12 injection to left deltoid, patient voiced no concerns nor showed any signs of distress during injection. 

## 2021-11-13 ENCOUNTER — Other Ambulatory Visit: Payer: Self-pay | Admitting: Oncology

## 2021-11-14 ENCOUNTER — Ambulatory Visit (INDEPENDENT_AMBULATORY_CARE_PROVIDER_SITE_OTHER): Payer: Medicare HMO

## 2021-11-14 DIAGNOSIS — E538 Deficiency of other specified B group vitamins: Secondary | ICD-10-CM | POA: Diagnosis not present

## 2021-11-14 MED ORDER — CYANOCOBALAMIN 1000 MCG/ML IJ SOLN
1000.0000 ug | Freq: Once | INTRAMUSCULAR | Status: AC
  Administered 2021-11-14: 1000 ug via INTRAMUSCULAR

## 2021-11-14 NOTE — Progress Notes (Signed)
Pt presented today for b12 injection. Left deltoid, IM. Pt voiced no concerns nor showed any signs of distress during injection. 

## 2021-11-21 ENCOUNTER — Telehealth: Payer: Self-pay | Admitting: Internal Medicine

## 2021-11-21 NOTE — Telephone Encounter (Signed)
Copied from [place] [id number]. Topic: Medicare AWV >> Nov 21, 2021 10:04 AM Devoria Glassing wrote: Reason for [id number] patient to schedule Annual Wellness Visit.  Please schedule with Nurse Health Advisor Denisa [facility], LPN at [facility].  Please call [phone] ask for Juliann Pulse.  Mailbox full

## 2021-11-28 ENCOUNTER — Encounter: Payer: Self-pay | Admitting: *Deleted

## 2021-12-07 ENCOUNTER — Other Ambulatory Visit: Payer: Self-pay | Admitting: Internal Medicine

## 2021-12-13 ENCOUNTER — Encounter: Payer: Self-pay | Admitting: *Deleted

## 2021-12-13 NOTE — Telephone Encounter (Addendum)
Prior Auth Approved  Auth# [id number] Eff: 12/13/21-12/14/22 Good for 2 visits

## 2021-12-15 ENCOUNTER — Ambulatory Visit (INDEPENDENT_AMBULATORY_CARE_PROVIDER_SITE_OTHER): Payer: Medicare HMO

## 2021-12-15 DIAGNOSIS — E538 Deficiency of other specified B group vitamins: Secondary | ICD-10-CM

## 2021-12-15 MED ORDER — CYANOCOBALAMIN 1000 MCG/ML IJ SOLN
1000.0000 ug | Freq: Once | INTRAMUSCULAR | Status: AC
  Administered 2021-12-15: 1000 ug via INTRAMUSCULAR

## 2021-12-15 NOTE — Progress Notes (Signed)
Patient presented for B 12 injection to left deltoid, patient voiced no concerns nor showed any signs of distress during injection. 

## 2021-12-28 ENCOUNTER — Ambulatory Visit (INDEPENDENT_AMBULATORY_CARE_PROVIDER_SITE_OTHER): Payer: Medicare HMO

## 2021-12-28 VITALS — Ht 63.0 in | Wt 174.0 lb

## 2021-12-28 DIAGNOSIS — Z0000 Encounter for general adult medical examination without abnormal findings: Secondary | ICD-10-CM | POA: Diagnosis not present

## 2021-12-28 NOTE — Progress Notes (Signed)
Subjective:   [name] [name] is a 88 y.o. female who presents for Medicare Annual (Subsequent) preventive examination.  Review of Systems    No ROS.  Medicare Wellness Virtual Visit.  Visual/audio telehealth visit, UTA vital signs.   See social history for additional risk factors.   Cardiac Risk Factors include: advanced age (>45mn, >>75women)     Objective:    Today's Vitals   12/28/21 0855  Weight: 174 lb (78.9 kg)  Height: '5\' 3"'$  (1.6 m)   Body mass index is 30.82 kg/m.     12/28/2021    9:09 AM 08/23/2021   10:00 AM 02/22/2021   10:13 AM 11/23/2020    1:27 PM 08/18/2020    1:41 PM 04/21/2020    9:50 AM 01/21/2020   10:45 AM  Advanced Directives  Does Patient Have a Medical Advance Directive? No No No No No No No  Does patient want to make changes to medical advance directive?   No - Patient declined   No - Patient declined No - Patient declined  Copy of [place] Chart?      No - copy requested No - copy requested  Would patient like information on creating a medical advance directive? No - Patient declined  No - Patient declined No - Patient declined  No - Patient declined No - Patient declined    Current Medications (verified) Outpatient Encounter Medications as of 12/28/2021  Medication Sig   amLODipine (NORVASC) 2.5 MG tablet TAKE 1 TABLET BY MOUTH EVERY DAY   Calcium Carbonate-Vitamin D 600-400 MG-UNIT tablet Take 1 tablet by mouth daily.    meloxicam (MOBIC) 7.5 MG tablet Take 7.5 mg by mouth daily as needed.   Multiple Vitamins-Minerals (MULTIVITAMIN WITH MINERALS) tablet Take 1 tablet by mouth daily.   tamoxifen (NOLVADEX) 20 MG tablet Take 1 tablet (20 mg total) by mouth daily.   Facility-Administered Encounter Medications as of 12/28/2021  Medication   lidocaine-prilocaine (EMLA) cream   sodium chloride 0.9 % 50 mL with famotidine (PEPCID) 20 mg infusion   sodium chloride flush (NS) 0.9 % injection 10 mL    Allergies  (verified) Patient has no known allergies.   History: Past Medical History:  Diagnosis Date   [place]ast cancer ([place]    Diverticulitis of sigmoid colon    Endometriosis    Family history of breast cancer    Family history of ovarian cancer    Family history of prostate cancer    Family history of uterine cancer    Fibrocystic breast disease    Glaucoma    H/O urinary incontinence    History of chicken pox    History of colon polyps 2014   Hyperlipidemia    Hypertension    [place]   Chemo tx's @ [facility] with Total Hysterectomy. Dr EAmalia Hailey  Personal history of chemotherapy    ovarian cancer   Personal history of ovarian cancer    Personal history of radiation therapy    Port-A-Cath in place    Sinusitis    Tuberculosis    positve skin test   Past Surgical History:  Procedure Laterality Date   ABDOMINAL HYSTERECTOMY  2003   complete Dr EAmalia Hailey  [facility]RA[place]  [place]   [place] [facility]  BREAST EXCISIONAL BIOPSY Left 1993   excisional bx. negative results   BREAST LUMPECTOMY Right 03/18/2018   [facility] Invasive ductal carcinoma with focal squamous differentiation, Nottingham grade 3, is identified forming a 6 mm mass, associated with high-grade ductal carcinoma in situ, radial scar and biopsy site changes.   BREAST SURGERY Left 1982   papilloma removal   COLONOSCOPY  2006, 2014   Dr Vira Agar    COLONOSCOPY N/A 03/27/2016   Procedure: COLONOSCOPY;  Surgeon: Robert Bellow, MD;  Location: [facility] ENDOSCOPY;  Service: Endoscopy;  Laterality: N/A;   COLOSTOMY REVERSAL  2014   at [place] Dr Raynaldo Opitz   NASAL SINUS SURGERY     For fungal infection   NASAL SINUS SURGERY  8/05 8/06   OOPHORECTOMY     OSTOMY  05/22/2012   diverticulitis with abcess/ Dr Tamala Julian   ovarian cancer     Exploratory lap   PORTA CATH INSERTION N/A 05/26/2018   Procedure: PORTA CATH INSERTION;  Surgeon: Algernon Huxley, MD;  Location:  [place] [facility];  Service: Cardiovascular;  Laterality: N/A;   PORTA CATH REMOVAL N/A 11/12/2019   Procedure: PORTA CATH REMOVAL;  Surgeon: Algernon Huxley, MD;  Location: [place] [facility];  Service: Cardiovascular;  Laterality: N/A;   TONSILLECTOMY AND ADENOIDECTOMY  1947   Family History  Problem Relation Age of Onset   Hypertension Mother    Coronary artery disease Mother    Heart disease Mother    Kidney disease Mother    Diabetes Mother    Cancer Mother        uterine cancer dx 97s   Diabetes Father    Kidney failure Father    Diabetes Brother    Coronary artery disease Brother    Cancer Brother        bladder cancer   Stroke Sister    Heart disease Sister        Pace Maker   Cancer Maternal Aunt 49       ovarian cancer   Breast cancer Cousin    Cancer Brother        Prostate   Social History   Socioeconomic History   Marital status: Widowed    Spouse name: Not on file   Number of children: 0   Years of education: Not on file   Highest education level: Not on file  Occupational History   Not on file  Tobacco Use   Smoking status: Never   Smokeless tobacco: Never  Vaping Use   Vaping Use: Never used  Substance and Sexual Activity   Alcohol use: No    Alcohol/week: 0.0 standard drinks of alcohol   Drug use: No   Sexual activity: Not Currently  Other Topics Concern   Not on file  Social History Narrative   Lives at home alone with dog   Social Determinants of Health   Financial Resource Strain: Low Risk  (12/28/2021)   Overall Financial Resource Strain (CARDIA)    Difficulty of Paying Living Expenses: Not hard at all  Food Insecurity: No Food Insecurity (12/28/2021)   Hunger Vital Sign    Worried About Running Out of Food in the Last Year: Never true    [place] in the Last Year: Never true  Transportation Needs: No Transportation Needs (12/28/2021)   PRAPARE - Hydrologist (Medical): No    Lack of Transportation  (Non-Medical): No  Physical Activity: Not on file  Stress: No Stress Concern Present (12/28/2021)   [place]  Questionnaire    Feeling of Stress : Not at all  Social Connections: Unknown (12/28/2021)   Social Connection and Isolation Panel [NHANES]    Frequency of Communication with Friends and Family: More than three times a week    Frequency of Social Gatherings with Friends and Family: Once a week    Attends Religious Services: More than 4 times per year    Active Member of [employer] or Organizations: Yes    Attends Music therapist: More than 4 times per year    Marital Status: Not on file    Tobacco Counseling Counseling given: Not Answered   Clinical Intake:  Pre-visit preparation completed: Yes        Diabetes: No  How often do you need to have someone help you when you read instructions, pamphlets, or other written materials from your doctor or pharmacy?: 1 - Never   Interpreter Needed?: No      Activities of Daily Living    12/28/2021    9:13 AM  In your present state of health, do you have any difficulty performing the following activities:  Hearing? 0  Vision? 0  Difficulty concentrating or making decisions? 0  Walking or climbing stairs? 1  Comment Paces self when walking. Cane in use.  Dressing or bathing? 0  Doing errands, shopping? 0  Preparing Food and eating ? N  Using the Toilet? N  In the past six months, have you accidently leaked urine? Y  Comment Managed with daily pad  Do you have problems with loss of bowel control? N  Managing your Medications? N  Managing your Finances? N  Housekeeping or managing your Housekeeping? N    Patient Care Team: Einar Pheasant, MD as PCP - General (Internal Medicine) Leone Haven, MD as Consulting Physician (Family Medicine) Christene Lye, MD (General Surgery) Einar Pheasant, MD (Internal Medicine) Bary Castilla, Forest Gleason, MD  (General Surgery) Noreene Filbert, MD as Referring Physician (Radiation Oncology) Delana Meyer, Dolores Lory, MD as Consulting Physician (Vascular Surgery) Sindy Guadeloupe, MD as Consulting Physician (Hematology and Oncology)  Indicate any recent Medical Services you may have received from other than [facility] providers in the past year (date may be approximate).     Assessment:   This is a routine wellness examination for Khadeejah.  Virtual Visit via Telephone Note  I connected with  Chanteria Haggard Gauntt on 12/28/21 at  8:45 AM EDT by telephone and verified that I am speaking with the correct person using two identifiers.   Persons participating in the virtual visit: patient/Nurse Health Advisor   I discussed the limitations of performing an evaluation and management service by telehealth. We continued and completed visit with audio only. Some vital signs may be absent or patient reported.   Hearing/Vision screen Hearing Screening - Comments:: Patient is able to hear conversational tones without difficulty. No issues reported. Vision Screening - Comments:: Followed by [facility] (Dr. Edison Pace)  Wears corrective lenses  They have regular follow up with the ophthalmologist  Dietary issues and exercise activities discussed: Current Exercise Habits: The patient does not participate in regular exercise at present Healthy diet Good water intake   Goals Addressed               This Visit's Progress     Patient Stated     Follow up with Primary Care Provider (pt-stated)        As needed. Arthritic exercises like stretching more often. Lose a little weight.  Portion control.         Depression Screen    12/28/2021    9:04 AM 11/23/2020    1:26 PM 11/23/2019   11:52 AM 11/19/2018    1:50 PM 08/29/2017   11:26 AM 03/06/2017    8:25 AM 03/06/2017    8:24 AM  PHQ 2/9 Scores  PHQ - 2 Score 0 0 0 0 0 0 0  PHQ- 9 Score      4     Fall Risk    12/28/2021    9:12 AM 11/23/2020    1:28 PM  11/23/2019   11:52 AM 11/19/2018    1:50 PM 08/29/2017   11:26 AM  Fall Risk   Falls in the past year? 0 0 0 0 No  Number falls in past yr:  0 0    Injury with Fall?  0     Follow up Falls evaluation completed Falls evaluation completed Falls evaluation completed      [place]: Home free of loose throw rugs in walkways, pet beds, electrical cords, etc? Yes  Adequate lighting in your home to reduce risk of falls? Yes   ASSISTIVE DEVICES UTILIZED TO PREVENT FALLS: Life alert? No  Use of a cane, walker or w/c? Yes  Grab bars in the bathroom? Yes  Shower chair or bench in shower? No  Elevated toilet seat or a handicapped toilet? No   TIMED UP AND GO: Was the test performed? No .   Cognitive Function: Patient is alert and oriented x3.  Enjoys reading.      08/29/2017   11:39 AM 02/08/2016    9:05 AM  MMSE - Mini Mental State Exam  Orientation to time 5 5  Orientation to Place 5 5  Registration 3 3  Attention/ Calculation 5 5  Recall 1 3  Language- name 2 objects 2 2  Language- repeat 1 1  Language- follow 3 step command 3 3  Language- read & follow direction 1 1  Write a sentence 1 1  Copy design 1 1  Total score 28 30        11/23/2019   11:58 AM 11/19/2018    1:51 PM  6CIT Screen  What Year? 0 points 0 points  What month? 0 points 0 points  What time?  0 points  Count back from 20  0 points  Months in reverse 0 points 0 points  Repeat phrase  0 points  Total Score  0 points    Immunizations Immunization History  Administered Date(s) Administered   PFIZER(Purple Top)SARS-COV-2 Vaccination 07/07/2019, 07/28/2019, 04/15/2020, 11/01/2020   Shingrix Completed?: No.    Education has been provided regarding the importance of this vaccine. Patient has been advised to call insurance company to determine out of pocket expense if they have not yet received this vaccine. Advised may also receive vaccine at local pharmacy or Health Dept.  Verbalized acceptance and understanding.  Screening Tests Health Maintenance  Topic Date Due   COVID-19 Vaccine (5 - Booster for Pfizer series) 01/13/2022 (Originally 12/27/2020)   Zoster Vaccines- Shingrix (1 of 2) 03/30/2022 (Originally 03/18/1953)   Pneumonia Vaccine 32+ Years old (1 - PCV) 06/07/2022 (Originally 03/17/2001)   INFLUENZA VACCINE  01/09/2022   MAMMOGRAM  02/16/2022   DEXA SCAN  Completed   HPV VACCINES  Aged Out   TETANUS/TDAP  Discontinued   Health Maintenance There are no preventive care reminders to display for this patient.  Lung Cancer Screening: (Low Dose CT Chest recommended if Age 86-80 years, 30 pack-year currently smoking OR have quit w/in 15years.) does not qualify.   Hepatitis C Screening: does not qualify.  Vision Screening: Recommended annual ophthalmology exams for early detection of glaucoma and other disorders of the eye.  Dental Screening: Recommended annual dental exams for proper oral hygiene. Visits every 4 months.   Community Resource Referral / Chronic Care Management: CRR required this visit?  No   CCM required this visit?  No      Plan:   Keep all routine maintenance appointments.   I have personally reviewed and noted the following in the patient's chart:   Medical and social history Use of alcohol, tobacco or illicit drugs  Current medications and supplements including opioid prescriptions.  Functional ability and status Nutritional status Physical activity Advanced directives List of other physicians Hospitalizations, surgeries, and ER visits in previous 12 months Vitals Screenings to include cognitive, depression, and falls Referrals and appointments  In addition, I have reviewed and discussed with patient certain preventive protocols, quality metrics, and best practice recommendations. A written personalized care plan for preventive services as well as general preventive health recommendations were provided to patient.      Varney Biles, LPN   [id number]

## 2021-12-28 NOTE — Patient Instructions (Addendum)
  [name] [name] , Thank you for taking time to come for your Medicare Wellness Visit. I appreciate your ongoing commitment to your health goals. Please review the following plan we discussed and let me know if I can assist you in the future.   These are the goals we discussed:  Goals       Patient Stated     Follow up with Primary Care Provider (pt-stated)      As needed. Arthritic exercises like stretching more often. Lose a little weight. Portion control.          This is a list of the screening recommended for you and due dates:  Health Maintenance  Topic Date Due   COVID-19 Vaccine (5 - Booster for Pfizer series) 01/13/2022*   Zoster (Shingles) Vaccine (1 of 2) 03/30/2022*   Pneumonia Vaccine (1 - PCV) 06/07/2022*   Flu Shot  01/09/2022   Mammogram  02/16/2022   DEXA scan (bone density measurement)  Completed   HPV Vaccine  Aged Out   Tetanus Vaccine  Discontinued  *Topic was postponed. The date shown is not the original due date.

## 2022-01-16 ENCOUNTER — Encounter: Payer: Self-pay | Admitting: Internal Medicine

## 2022-01-16 ENCOUNTER — Ambulatory Visit: Payer: Medicare HMO

## 2022-01-16 ENCOUNTER — Ambulatory Visit (INDEPENDENT_AMBULATORY_CARE_PROVIDER_SITE_OTHER): Payer: Medicare HMO | Admitting: Internal Medicine

## 2022-01-16 VITALS — BP 130/74 | HR 75 | Temp 98.8°F | Resp 16 | Ht 63.0 in | Wt 170.6 lb

## 2022-01-16 DIAGNOSIS — G620 Drug-induced polyneuropathy: Secondary | ICD-10-CM

## 2022-01-16 DIAGNOSIS — E7800 Pure hypercholesterolemia, unspecified: Secondary | ICD-10-CM | POA: Diagnosis not present

## 2022-01-16 DIAGNOSIS — M810 Age-related osteoporosis without current pathological fracture: Secondary | ICD-10-CM

## 2022-01-16 DIAGNOSIS — E538 Deficiency of other specified B group vitamins: Secondary | ICD-10-CM

## 2022-01-16 DIAGNOSIS — D649 Anemia, unspecified: Secondary | ICD-10-CM | POA: Diagnosis not present

## 2022-01-16 DIAGNOSIS — M25561 Pain in right knee: Secondary | ICD-10-CM

## 2022-01-16 DIAGNOSIS — C50411 Malignant neoplasm of upper-outer quadrant of right female breast: Secondary | ICD-10-CM

## 2022-01-16 DIAGNOSIS — Z0000 Encounter for general adult medical examination without abnormal findings: Secondary | ICD-10-CM

## 2022-01-16 DIAGNOSIS — T451X5A Adverse effect of antineoplastic and immunosuppressive drugs, initial encounter: Secondary | ICD-10-CM

## 2022-01-16 DIAGNOSIS — M25562 Pain in left knee: Secondary | ICD-10-CM

## 2022-01-16 DIAGNOSIS — I10 Essential (primary) hypertension: Secondary | ICD-10-CM

## 2022-01-16 DIAGNOSIS — E559 Vitamin D deficiency, unspecified: Secondary | ICD-10-CM

## 2022-01-16 DIAGNOSIS — Z86010 Personal history of colonic polyps: Secondary | ICD-10-CM

## 2022-01-16 DIAGNOSIS — Z170 Estrogen receptor positive status [ER+]: Secondary | ICD-10-CM

## 2022-01-16 MED ORDER — CYANOCOBALAMIN 1000 MCG/ML IJ SOLN
1000.0000 ug | Freq: Once | INTRAMUSCULAR | Status: AC
  Administered 2022-01-16: 1000 ug via INTRAMUSCULAR

## 2022-01-16 NOTE — Assessment & Plan Note (Signed)
Physical today 01/16/22.  Colonoscopy 03/2016.  Mammogram 02/16/21 - ok.  Continue prolia.

## 2022-01-16 NOTE — Progress Notes (Signed)
Patient ID: NAYOMI TABRON, female   DOB: Sep 25, 1933, 88 y.o.   MRN: [id number]   Subjective:    Patient ID: [name], female    DOB: [birth year], 88 y.o.   MRN: [id number]   Patient here for her physical exam.   Chief Complaint  Patient presents with   Follow-up    Yearly CPE   .   HPI Reports she is doing relatively well.  Does reports some knee pain - "arthritis" - uses tylenol.  No chest pain or sob reported.  No abdominal pain.  Bowels moving.  Being followed by oncology for her breast cancer.  Currently on tamoxifen.  Tolerating.  Receiving prolia.  Due f/u bone density this fall.    Past Medical History:  Diagnosis Date   Breast cancer ([place])    Diverticulitis of sigmoid colon    Endometriosis    Family history of breast cancer    Family history of ovarian cancer    Family history of prostate cancer    Family history of uterine cancer    Fibrocystic breast disease    Glaucoma    H/O urinary incontinence    History of chicken pox    History of colon polyps 2014   Hyperlipidemia    Hypertension    Ovarian cancer ([place]) 2002   Chemo tx's @ [facility] with Total Hysterectomy. Dr Amalia Hailey   Personal history of chemotherapy    ovarian cancer   Personal history of ovarian cancer    Personal history of radiation therapy    Port-A-Cath in place    Sinusitis    Tuberculosis    positve skin test   Past Surgical History:  Procedure Laterality Date   ABDOMINAL HYSTERECTOMY  2003   complete Dr Amalia Hailey   ABSCESS DRAINAGE  2017   right buttock at [place]  2003   BREAST EXCISIONAL BIOPSY Left 1993   excisional bx. negative results   BREAST LUMPECTOMY Right 03/18/2018   done at [facility] breast ca rad and chemo Invasive ductal carcinoma with focal squamous differentiation, Nottingham grade 3, is identified forming a 6 mm mass, associated with high-grade ductal carcinoma in situ, radial scar and biopsy site changes.   BREAST SURGERY Left 1982   papilloma removal    COLONOSCOPY  2006, 2014   Dr Vira Agar    COLONOSCOPY N/A 03/27/2016   Procedure: COLONOSCOPY;  Surgeon: Robert Bellow, MD;  Location: [facility] ENDOSCOPY;  Service: Endoscopy;  Laterality: N/A;   COLOSTOMY REVERSAL  2014   at [place] Dr Raynaldo Opitz   NASAL SINUS SURGERY     For fungal infection   NASAL SINUS SURGERY  8/05 8/06   OOPHORECTOMY     OSTOMY  05/22/2012   diverticulitis with abcess/ Dr Tamala Julian   ovarian cancer     Exploratory lap   PORTA CATH INSERTION N/A 05/26/2018   Procedure: PORTA CATH INSERTION;  Surgeon: Algernon Huxley, MD;  Location: [place] [facility];  Service: Cardiovascular;  Laterality: N/A;   PORTA CATH REMOVAL N/A 11/12/2019   Procedure: PORTA CATH REMOVAL;  Surgeon: Algernon Huxley, MD;  Location: [place] [facility];  Service: Cardiovascular;  Laterality: N/A;   TONSILLECTOMY AND ADENOIDECTOMY  1947   Family History  Problem Relation Age of Onset   Hypertension Mother    Coronary artery disease Mother    Heart disease Mother    Kidney disease Mother    Diabetes Mother    Cancer Mother  uterine cancer dx 15s   Diabetes Father    Kidney failure Father    Diabetes Brother    Coronary artery disease Brother    Cancer Brother        bladder cancer   Stroke Sister    Heart disease Sister        [name]   Cancer Maternal Aunt 49       ovarian cancer   Breast cancer Cousin    Cancer Brother        Prostate   Social History   Socioeconomic History   Marital status: Widowed    Spouse name: Not on file   Number of children: 0   Years of education: Not on file   Highest education level: Not on file  Occupational History   Not on file  Tobacco Use   Smoking status: Never   Smokeless tobacco: Never  Vaping Use   Vaping Use: Never used  Substance and Sexual Activity   Alcohol use: No    Alcohol/week: 0.0 standard drinks of alcohol   Drug use: No   Sexual activity: Not Currently  Other Topics Concern   Not on file  Social History Narrative    Lives at home alone with dog   Social Determinants of Health   Financial Resource Strain: Low Risk  (12/28/2021)   Overall Financial Resource Strain (CARDIA)    Difficulty of Paying Living Expenses: Not hard at all  Food Insecurity: No Food Insecurity (12/28/2021)   Hunger Vital Sign    Worried About Running Out of Food in the Last Year: Never true    [place] in the Last Year: Never true  Transportation Needs: No Transportation Needs (12/28/2021)   PRAPARE - Hydrologist (Medical): No    Lack of Transportation (Non-Medical): No  Physical Activity: Not on file  Stress: No Stress Concern Present (12/28/2021)   [place]    Feeling of Stress : Not at all  Social Connections: Unknown (12/28/2021)   Social Connection and Isolation Panel [NHANES]    Frequency of Communication with Friends and Family: More than three times a week    Frequency of Social Gatherings with Friends and Family: Once a week    Attends Religious Services: More than 4 times per year    Active Member of [employer] or Organizations: Yes    Attends Music therapist: More than 4 times per year    Marital Status: Not on file     Review of Systems  Constitutional:  Negative for appetite change and unexpected weight change.  HENT:  Negative for congestion, sinus pressure and sore throat.   Eyes:  Negative for pain and visual disturbance.  Respiratory:  Negative for cough, chest tightness and shortness of breath.   Cardiovascular:  Negative for chest pain, palpitations and leg swelling.  Gastrointestinal:  Negative for abdominal pain, diarrhea, nausea and vomiting.  Genitourinary:  Negative for difficulty urinating and dysuria.  Musculoskeletal:  Negative for joint swelling and myalgias.  Skin:  Negative for color change and rash.  Neurological:  Negative for dizziness, light-headedness and headaches.   Hematological:  Negative for adenopathy. Does not bruise/bleed easily.  Psychiatric/Behavioral:  Negative for agitation and dysphoric mood.        Objective:     BP 130/74 (BP Location: Left Arm, Patient Position: Sitting, Cuff Size: Large)   Pulse 75   Temp  98.8 F (37.1 C) (Temporal)   Resp 16   Ht '5\' 3"'$  (1.6 m)   Wt 170 lb 9.6 oz (77.4 kg)   SpO2 97%   BMI 30.22 kg/m  Wt Readings from Last 3 Encounters:  01/16/22 170 lb 9.6 oz (77.4 kg)  12/28/21 174 lb (78.9 kg)  08/23/21 174 lb (78.9 kg)    Physical Exam Vitals reviewed.  Constitutional:      General: She is not in acute distress.    Appearance: Normal appearance. She is well-developed.  HENT:     Head: Normocephalic and atraumatic.     Right Ear: External ear normal.     Left Ear: External ear normal.  Eyes:     General: No scleral icterus.       Right eye: No discharge.        Left eye: No discharge.     Conjunctiva/sclera: Conjunctivae normal.  Neck:     Thyroid: No thyromegaly.  Cardiovascular:     Rate and Rhythm: Normal rate and regular rhythm.  Pulmonary:     Effort: No tachypnea, accessory muscle usage or respiratory distress.     Breath sounds: Normal breath sounds. No decreased breath sounds or wheezing.  Chest:  Breasts:    Right: No inverted nipple, mass, nipple discharge or tenderness (no axillary adenopathy).     Left: No inverted nipple, mass, nipple discharge or tenderness (no axilarry adenopathy).  Abdominal:     General: Bowel sounds are normal.     Palpations: Abdomen is soft.     Tenderness: There is no abdominal tenderness.  Musculoskeletal:        General: No swelling or tenderness.     Cervical back: Neck supple.  Lymphadenopathy:     Cervical: No cervical adenopathy.  Skin:    Findings: No erythema or rash.  Neurological:     Mental Status: She is alert and oriented to person, place, and time.  Psychiatric:        Mood and Affect: Mood normal.        Behavior: Behavior  normal.      Outpatient Encounter Medications as of 01/16/2022  Medication Sig   amLODipine (NORVASC) 2.5 MG tablet TAKE 1 TABLET BY MOUTH EVERY DAY   Calcium Carbonate-Vitamin D 600-400 MG-UNIT tablet Take 1 tablet by mouth daily.    meloxicam (MOBIC) 7.5 MG tablet Take 7.5 mg by mouth daily as needed.   Multiple Vitamins-Minerals (MULTIVITAMIN WITH MINERALS) tablet Take 1 tablet by mouth daily.   tamoxifen (NOLVADEX) 20 MG tablet Take 1 tablet (20 mg total) by mouth daily.   Facility-Administered Encounter Medications as of 01/16/2022  Medication   [COMPLETED] cyanocobalamin (VITAMIN B12) injection 1,000 mcg   lidocaine-prilocaine (EMLA) cream   sodium chloride 0.9 % 50 mL with famotidine (PEPCID) 20 mg infusion   sodium chloride flush (NS) 0.9 % injection 10 mL     Lab Results  Component Value Date   WBC 8.4 03/09/2021   HGB 12.5 03/09/2021   HCT 38.5 03/09/2021   PLT 244.0 03/09/2021   GLUCOSE 84 06/19/2021   CHOL 144 06/19/2021   TRIG 148.0 06/19/2021   HDL 56.00 06/19/2021   LDLCALC 58 06/19/2021   ALT 15 06/19/2021   AST 17 06/19/2021   NA 141 06/19/2021   K 3.9 06/19/2021   CL 107 06/19/2021   CREATININE 0.61 06/19/2021   BUN 12 06/19/2021   CO2 28 06/19/2021   TSH 2.41 12/06/2020   INR 1.0  05/21/2012    DG Bone Density  Result Date: 04/06/2021 EXAM: DUAL X-RAY ABSORPTIOMETRY (DXA) FOR BONE MINERAL DENSITY IMPRESSION: Your patient Jakayla Schweppe completed a BMD test on 04/06/2021 using the [employer] (software version: 14.10) manufactured by [employer]. The following summarizes the results of our evaluation. Technologist: crr PATIENT BIOGRAPHICAL: Name: Annelyse, Rey Patient ID: [id number] Birth Date: 01/24/1934 Height: 62.0 in. Gender: Female Exam Date: 04/06/2021 Weight: 169.4 lbs. Indications: Advanced Age, Caucasian, Height Loss, High Risk Meds, History of Breast Cancer, History of Chemo, History of Ovarian Cancer, History of  Radiation, Hysterectomy, Oophorectomy Bilateral, Osteoarthritis, Postmenopausal Fractures: Treatments: calcium w/ vit D, Multi-Vitamin, Prolia, Tamoxifen DENSITOMETRY RESULTS: Site         Region     Measured Date Measured Age WHO Classification Young Adult T-score BMD         %Change vs. Previous Significant Change (*) DualFemur Neck Left 04/06/[age] 85.0 Osteopenia -2.4 0.707 g/cm2 7.8% - DualFemur Neck Left 04/06/2019 83.0 Osteoporosis -2.7 0.656 g/cm2 -4.1% - DualFemur Neck Left 04/01/2017 81.0 Osteoporosis -2.5 0.684 g/cm2 - - DualFemur Total Mean 04/06/2021 85.0 Osteopenia -1.8 0.778 g/cm2 3.2% Yes DualFemur Total Mean 04/06/2019 83.0 Osteopenia -2.0 0.754 g/cm2 -3.8% Yes DualFemur Total Mean 04/01/2017 81.0 Osteopenia -1.8 0.784 g/cm2 - - Left Forearm Radius 33% 04/06/2021 85.0 Osteopenia -1.9 0.708 g/cm2 -1.1% - Left Forearm Radius 33% 04/06/2019 83.0 Osteopenia -1.8 0.716 g/cm2 -2.3% - Left Forearm Radius 33% 04/01/2017 81.0 Osteopenia -1.6 0.733 g/cm2 - - ASSESSMENT: The BMD measured at Femur Neck Left is 0.707 g/cm2 with a T-score of -2.4. This patient is considered osteopenic according to [facility]) criteria. Lumbar spine was not utilized due to advanced degenerative changes. The scan quality is limited by exclusion of L-spine. Patient is not a candidate for FRAX assessment due to Prolia use. [facility]) criteria for post-menopausal, Caucasian Women: Normal:                   T-score at or above -1 SD Osteopenia/low bone mass: T-score between -1 and -2.5 SD Osteoporosis:             T-score at or below -2.5 SD RECOMMENDATIONS: 1. All patients should optimize calcium and vitamin D intake. 2. Consider FDA-approved medical therapies in postmenopausal women and men aged 88 years and older, based on the following: a. A hip or vertebral(clinical or morphometric) fracture b. T-score < -2.5 at the femoral neck or spine after appropriate evaluation to exclude secondary  causes c. Low bone mass (T-score between -1.0 and -2.5 at the femoral neck or spine) and a 10-year probability of a hip fracture > 3% or a 10-year probability of a major osteoporosis-related fracture > 20% based on the [place]-adapted WHO algorithm 3. Clinician judgment and/or patient preferences may indicate treatment for people with 10-year fracture probabilities above or below these levels FOLLOW-UP: People with diagnosed cases of osteoporosis or at high risk for fracture should have regular bone mineral density tests. For patients eligible for Medicare, routine testing is allowed once every 2 years. The testing frequency can be increased to one year for patients who have rapidly progressing disease, those who are receiving or discontinuing medical therapy to restore bone mass, or have additional risk factors. I have reviewed this report, and agree with the above findings. [facility] Electronically Signed   By: Rolm Baptise M.D.   On: 04/06/2021 20:51       Assessment &  Plan:   Problem List Items Addressed This Visit     Anemia    Followed by oncology.  They follow cbc.       Relevant Orders   CBC w/Diff   B12 deficiency   Relevant Orders   B12   Carcinoma of upper-outer quadrant of right breast in female, estrogen receptor positive ([place])    Continue tamoxifen.  Followed by oncology.  Mammogram 02/16/21 - Birads I.       Chemotherapy-induced neuropathy ([place])    Documented - oncology.  Stable.       Health care maintenance    Physical today 01/16/22.  Colonoscopy 03/2016.  Mammogram 02/16/21 - ok.  Continue prolia.       History of colonic polyps    Colonoscopy 03/2016 - normal.        Hypercholesterolemia    Low cholesterol diet and exercise.  Follow lipid panel.       Relevant Orders   Lipid Profile   Hepatic function panel   Hypertension, essential    On amlodipine 2.'5mg'$  q day.  Blood pressure as outlined.  Follow pressures.  Follow metabolic panel.        Relevant Orders   Basic Metabolic Panel (BMET)   Knee pain    Does not feel needs any further evaluation at this time. Takes tylenol prn.  Follow.        Osteoporosis    prolia - 07/31/21.       Vitamin D deficiency    Follow vitamin d level.       Relevant Orders   VITAMIN D 25 Hydroxy (Vit-D Deficiency, Fractures)   Other Visit Diagnoses     Routine general medical examination at a health care facility    -  Primary        Einar Pheasant, MD

## 2022-01-27 ENCOUNTER — Encounter: Payer: Self-pay | Admitting: Internal Medicine

## 2022-01-27 NOTE — Assessment & Plan Note (Signed)
Continue tamoxifen.  Followed by oncology.  Mammogram 02/16/21 - Birads I.

## 2022-01-27 NOTE — Assessment & Plan Note (Signed)
Colonoscopy 03/2016 - normal.   

## 2022-01-27 NOTE — Assessment & Plan Note (Signed)
Does not feel needs any further evaluation at this time. Takes tylenol prn.  Follow.

## 2022-01-27 NOTE — Assessment & Plan Note (Signed)
Low cholesterol diet and exercise.  Follow lipid panel.   

## 2022-01-27 NOTE — Assessment & Plan Note (Signed)
Followed by oncology.  They follow cbc.  

## 2022-01-27 NOTE — Assessment & Plan Note (Signed)
Documented - oncology.  Stable.  

## 2022-01-27 NOTE — Assessment & Plan Note (Signed)
prolia - 07/31/21.

## 2022-01-27 NOTE — Assessment & Plan Note (Signed)
Follow vitamin d level.   

## 2022-01-27 NOTE — Assessment & Plan Note (Signed)
On amlodipine 2.5mg q day.  Blood pressure as outlined.  Follow pressures.  Follow metabolic panel.  

## 2022-01-29 ENCOUNTER — Ambulatory Visit: Payer: Medicare HMO

## 2022-01-29 DIAGNOSIS — M810 Age-related osteoporosis without current pathological fracture: Secondary | ICD-10-CM | POA: Diagnosis not present

## 2022-01-29 MED ORDER — DENOSUMAB 60 MG/ML ~~LOC~~ SOSY
60.0000 mg | PREFILLED_SYRINGE | Freq: Once | SUBCUTANEOUS | Status: AC
  Administered 2022-01-29: 60 mg via SUBCUTANEOUS

## 2022-01-29 NOTE — Progress Notes (Signed)
Patient came in today for Prolia injection given in left arm SQ. Patient tolerated well with no signs of distress.  °

## 2022-01-30 ENCOUNTER — Other Ambulatory Visit: Payer: Medicare HMO

## 2022-01-30 ENCOUNTER — Ambulatory Visit: Payer: Medicare HMO

## 2022-01-30 ENCOUNTER — Ambulatory Visit (INDEPENDENT_AMBULATORY_CARE_PROVIDER_SITE_OTHER): Payer: Medicare HMO

## 2022-01-30 DIAGNOSIS — D649 Anemia, unspecified: Secondary | ICD-10-CM

## 2022-01-30 DIAGNOSIS — I10 Essential (primary) hypertension: Secondary | ICD-10-CM | POA: Diagnosis not present

## 2022-01-30 DIAGNOSIS — E538 Deficiency of other specified B group vitamins: Secondary | ICD-10-CM

## 2022-01-30 DIAGNOSIS — E559 Vitamin D deficiency, unspecified: Secondary | ICD-10-CM | POA: Diagnosis not present

## 2022-01-30 DIAGNOSIS — E7800 Pure hypercholesterolemia, unspecified: Secondary | ICD-10-CM

## 2022-01-30 LAB — VITAMIN D 25 HYDROXY (VIT D DEFICIENCY, FRACTURES): VITD: 28.41 ng/mL — ABNORMAL LOW (ref 30.00–100.00)

## 2022-01-30 LAB — CBC WITH DIFFERENTIAL/PLATELET
Basophils Absolute: 0 10*3/uL (ref 0.0–0.1)
Basophils Relative: 0.6 % (ref 0.0–3.0)
Eosinophils Absolute: 0 10*3/uL (ref 0.0–0.7)
Eosinophils Relative: 0.5 % (ref 0.0–5.0)
HCT: 36.7 % (ref 36.0–46.0)
Hemoglobin: 12.1 g/dL (ref 12.0–15.0)
Lymphocytes Relative: 30.3 % (ref 12.0–46.0)
Lymphs Abs: 2.4 10*3/uL (ref 0.7–4.0)
MCHC: 33 g/dL (ref 30.0–36.0)
MCV: 94.8 fl (ref 78.0–100.0)
Monocytes Absolute: 0.6 10*3/uL (ref 0.1–1.0)
Monocytes Relative: 8 % (ref 3.0–12.0)
Neutro Abs: 4.8 10*3/uL (ref 1.4–7.7)
Neutrophils Relative %: 60.6 % (ref 43.0–77.0)
Platelets: 240 10*3/uL (ref 150.0–400.0)
RBC: 3.87 Mil/uL (ref 3.87–5.11)
RDW: 14.1 % (ref 11.5–15.5)
WBC: 7.9 10*3/uL (ref 4.0–10.5)

## 2022-01-30 LAB — LIPID PANEL
Cholesterol: 152 mg/dL (ref 0–200)
HDL: 53.4 mg/dL (ref >39.00)
LDL Cholesterol: 60 mg/dL (ref 0–99)
NonHDL: 98.9
Total CHOL/HDL Ratio: 3
Triglycerides: 194 mg/dL — ABNORMAL HIGH (ref 0.0–149.0)
VLDL: 38.8 mg/dL (ref 0.0–40.0)

## 2022-01-30 LAB — BASIC METABOLIC PANEL
BUN: 15 mg/dL (ref 6–23)
CO2: 26 mEq/L (ref 19–32)
Calcium: 8.4 mg/dL (ref 8.4–10.5)
Chloride: 106 mEq/L (ref 96–112)
Creatinine, Ser: 0.55 mg/dL (ref 0.40–1.20)
GFR: 83.32 mL/min (ref >60.00)
Glucose, Bld: 83 mg/dL (ref 70–99)
Potassium: 4 mEq/L (ref 3.5–5.1)
Sodium: 140 mEq/L (ref 135–145)

## 2022-01-30 LAB — HEPATIC FUNCTION PANEL
ALT: 15 U/L (ref 0–35)
AST: 21 U/L (ref 0–37)
Albumin: 3.3 g/dL — ABNORMAL LOW (ref 3.5–5.2)
Alkaline Phosphatase: 40 U/L (ref 39–117)
Bilirubin, Direct: 0.1 mg/dL (ref 0.0–0.3)
Total Bilirubin: 0.4 mg/dL (ref 0.2–1.2)
Total Protein: 5.1 g/dL — ABNORMAL LOW (ref 6.0–8.3)

## 2022-01-30 LAB — VITAMIN B12: Vitamin B-12: 370 pg/mL (ref 211–911)

## 2022-01-30 MED ORDER — CYANOCOBALAMIN 1000 MCG/ML IJ SOLN
1000.0000 ug | Freq: Once | INTRAMUSCULAR | Status: AC
  Administered 2022-01-30: 1000 ug via INTRAMUSCULAR

## 2022-01-30 MED ORDER — CYANOCOBALAMIN 1000 MCG/ML IJ SOLN: 1000.0000 ug | Freq: Once | INTRAMUSCULAR | Status: DC

## 2022-01-30 NOTE — Progress Notes (Signed)
Patient presented for B 12 injection to left deltoid, patient voiced no concerns nor showed any signs of distress during injection. 

## 2022-01-31 ENCOUNTER — Telehealth: Payer: Self-pay

## 2022-01-31 NOTE — Telephone Encounter (Signed)
LMTCB for lab results.  

## 2022-01-31 NOTE — Telephone Encounter (Signed)
Pt returning call

## 2022-02-01 NOTE — Telephone Encounter (Signed)
See result note.  

## 2022-02-20 ENCOUNTER — Ambulatory Visit
Admission: RE | Admit: 2022-02-20 | Discharge: 2022-02-20 | Disposition: A | Discharge status: Home / Self Care | Payer: Medicare HMO | Source: Ambulatory Visit | Attending: Oncology | Admitting: Oncology

## 2022-02-20 DIAGNOSIS — C50411 Malignant neoplasm of upper-outer quadrant of right female breast: Secondary | ICD-10-CM | POA: Diagnosis present

## 2022-02-20 DIAGNOSIS — Z1231 Encounter for screening mammogram for malignant neoplasm of breast: Secondary | ICD-10-CM | POA: Diagnosis not present

## 2022-02-20 DIAGNOSIS — Z170 Estrogen receptor positive status [ER+]: Secondary | ICD-10-CM | POA: Diagnosis present

## 2022-02-23 ENCOUNTER — Encounter: Payer: Self-pay | Admitting: Oncology

## 2022-02-23 ENCOUNTER — Inpatient Hospital Stay: Payer: Medicare HMO | Attending: Oncology | Admitting: Oncology

## 2022-02-23 VITALS — BP 128/28 | HR 65 | Temp 97.7°F | Resp 16 | Ht 63.0 in | Wt 174.0 lb

## 2022-02-23 DIAGNOSIS — Z08 Encounter for follow-up examination after completed treatment for malignant neoplasm: Secondary | ICD-10-CM

## 2022-02-23 DIAGNOSIS — C50911 Malignant neoplasm of unspecified site of right female breast: Secondary | ICD-10-CM | POA: Diagnosis not present

## 2022-02-23 DIAGNOSIS — Z79810 Long term (current) use of selective estrogen receptor modulators (SERMs): Secondary | ICD-10-CM | POA: Diagnosis not present

## 2022-02-23 DIAGNOSIS — Z5181 Encounter for therapeutic drug level monitoring: Secondary | ICD-10-CM | POA: Diagnosis not present

## 2022-02-23 DIAGNOSIS — Z853 Personal history of malignant neoplasm of breast: Secondary | ICD-10-CM | POA: Diagnosis not present

## 2022-02-23 DIAGNOSIS — Z170 Estrogen receptor positive status [ER+]: Secondary | ICD-10-CM | POA: Insufficient documentation

## 2022-02-23 NOTE — Progress Notes (Unsigned)
Pt has no breast concerns and she does ave arthritis that bothers her when walking

## 2022-03-01 ENCOUNTER — Encounter: Payer: Self-pay | Admitting: Hematology and Oncology

## 2022-03-01 NOTE — Progress Notes (Signed)
Hematology/Oncology Consult note [facility]  Telephone:([phone] [phone]  Patient Care Team: Einar Pheasant, MD as PCP - General (Internal Medicine) Caryl Bis Angela Adam, MD as Consulting Physician (Family Medicine) Christene Lye, MD (General Surgery) Einar Pheasant, MD (Internal Medicine) Bary Castilla, Forest Gleason, MD (General Surgery) Noreene Filbert, MD as Referring Physician (Radiation Oncology) Delana Meyer, Dolores Lory, MD as Consulting Physician (Vascular Surgery) Sindy Guadeloupe, MD as Consulting Physician (Hematology and Oncology)   Name of the patient: [name] [name]  [id number]  07/12/1933   Date of visit: 03/01/22  Diagnosis-stage I right breast cancer in 2019  Chief complaint/ Reason for visit-routine follow-up of breast cancer  Heme/Onc history: Patient is a 88 year old female who was diagnosed with stage I HER2 positiveRight breast cancer in 2019 s/p lumpectomy and sentinel lymph node biopsy.  Final pathology showed 0.6 cm grade 3 invasive mammary carcinoma ER 99% positive PR 25% positive and HER2 positive by FISH.  She received 12 weeks of Herceptin and Taxol followed by weekly Herceptin for 1 year.  She also received adjuvant radiation treatment she is currently on tamoxifen for hormone therapy   Also has a history of stage IIb ovarian cancer s/p exploratory laparotomy TAH/BSO omentectomy peritoneal biopsies and pelvic and periaortic lymph node sampling back in 2003.  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  She has also received adjuvant CarboTaxol for it back then.  Has baseline osteoporosis on bone density.  Interval history-patient is tolerating tamoxifen well.  Denies any specific concerns.  Appetite and weight have remained stable.  ECOG PS- 1 Pain scale- 0   Review of systems- Review of Systems  Constitutional:  Negative for chills, fever and weight loss.  HENT:  Negative for congestion, ear  discharge and nosebleeds.   Eyes:  Negative for blurred vision.  Respiratory:  Negative for cough, hemoptysis, sputum production, shortness of breath and wheezing.   Cardiovascular:  Negative for chest pain, palpitations, orthopnea and claudication.  Gastrointestinal:  Negative for abdominal pain, blood in stool, constipation, diarrhea, heartburn, melena, nausea and vomiting.  Genitourinary:  Negative for dysuria, flank pain, frequency, hematuria and urgency.  Musculoskeletal:  Negative for back pain, joint pain and myalgias.  Skin:  Negative for rash.  Neurological:  Negative for dizziness, tingling, focal weakness, seizures, weakness and headaches.  Endo/Heme/Allergies:  Does not bruise/bleed easily.  Psychiatric/Behavioral:  Negative for depression and suicidal ideas. The patient does not have insomnia.       No Known Allergies   Past Medical History:  Diagnosis Date   Breast cancer ([place])    Diverticulitis of sigmoid colon    Endometriosis    Family history of breast cancer    Family history of ovarian cancer    Family history of prostate cancer    Family history of uterine cancer    Fibrocystic breast disease    Glaucoma    H/O urinary incontinence    History of chicken pox    History of colon polyps 2014   Hyperlipidemia    Hypertension    Ovarian cancer ([place]) 2002   Chemo tx's @ [facility] with Total Hysterectomy. Dr Amalia Hailey   Personal history of chemotherapy    ovarian cancer   Personal history of ovarian cancer    Personal history of radiation therapy    Port-A-Cath in place    Sinusitis    Tuberculosis    positve skin test     Past Surgical History:  Procedure  Laterality Date   ABDOMINAL HYSTERECTOMY  2003   complete Dr Amalia Hailey   ABSCESS DRAINAGE  2017   right buttock at [place]  2003   BREAST EXCISIONAL BIOPSY Left 1993   excisional bx. negative results   BREAST LUMPECTOMY Right 03/18/2018   done at [facility] breast ca rad and chemo Invasive ductal  carcinoma with focal squamous differentiation, Nottingham grade 3, is identified forming a 6 mm mass, associated with high-grade ductal carcinoma in situ, radial scar and biopsy site changes.   BREAST SURGERY Left 1982   papilloma removal   COLONOSCOPY  2006, 2014   Dr Vira Agar    COLONOSCOPY N/A 03/27/2016   Procedure: COLONOSCOPY;  Surgeon: Robert Bellow, MD;  Location: [facility] ENDOSCOPY;  Service: Endoscopy;  Laterality: N/A;   COLOSTOMY REVERSAL  2014   at [facility] Dr Raynaldo Opitz   NASAL SINUS SURGERY     For fungal infection   NASAL SINUS SURGERY  8/05 8/06   OOPHORECTOMY     OSTOMY  05/22/2012   diverticulitis with abcess/ Dr Tamala Julian   ovarian cancer     Exploratory lap   PORTA CATH INSERTION N/A 05/26/2018   Procedure: PORTA CATH INSERTION;  Surgeon: Algernon Huxley, MD;  Location: [place] CV LAB;  Service: Cardiovascular;  Laterality: N/A;   PORTA CATH REMOVAL N/A 11/12/2019   Procedure: PORTA CATH REMOVAL;  Surgeon: Algernon Huxley, MD;  Location: [place] CV LAB;  Service: Cardiovascular;  Laterality: N/A;   TONSILLECTOMY AND ADENOIDECTOMY  1947    Social History   Socioeconomic History   Marital status: Widowed    Spouse name: Not on file   Number of children: 0   Years of education: Not on file   Highest education level: Not on file  Occupational History   Not on file  Tobacco Use   Smoking status: Never   Smokeless tobacco: Never  Vaping Use   Vaping Use: Never used  Substance and Sexual Activity   Alcohol use: No    Alcohol/week: 0.0 standard drinks of alcohol   Drug use: Yes   Sexual activity: Not Currently  Other Topics Concern   Not on file  Social History Narrative   Lives at home alone with dog   Social Determinants of Health   Financial Resource Strain: Low Risk  (12/28/2021)   Overall Financial Resource Strain (CARDIA)    Difficulty of Paying Living Expenses: Not hard at all  Food Insecurity: No Food Insecurity (12/28/2021)   Hunger Vital Sign     Worried About Running Out of Food in the Last Year: Never true    [place] in the Last Year: Never true  Transportation Needs: No Transportation Needs (12/28/2021)   PRAPARE - Hydrologist (Medical): No    Lack of Transportation (Non-Medical): No  Physical Activity: Not on file  Stress: No Stress Concern Present (12/28/2021)   [place]    Feeling of Stress : Not at all  Social Connections: Unknown (12/28/2021)   Social Connection and Isolation Panel [NHANES]    Frequency of Communication with Friends and Family: More than three times a week    Frequency of Social Gatherings with Friends and Family: Once a week    Attends Religious Services: More than 4 times per year    Active Member of [employer] or Organizations: Yes    Attends Archivist Meetings:  More than 4 times per year    Marital Status: Not on file  Intimate Partner Violence: Not At Risk (12/28/2021)   Humiliation, Afraid, Rape, and Kick questionnaire    Fear of Current or Ex-Partner: No    Emotionally Abused: No    Physically Abused: No    Sexually Abused: No    Family History  Problem Relation Age of Onset   Hypertension Mother    Coronary artery disease Mother    Heart disease Mother    Kidney disease Mother    Diabetes Mother    Cancer Mother        uterine cancer dx 68s   Diabetes Father    Kidney failure Father    Stroke Sister    Heart disease Sister        Psychologist, forensic   Dementia Sister    Diabetes Brother    Coronary artery disease Brother    Cancer Brother        bladder cancer   Cancer Brother        Prostate   Cancer Maternal Aunt 49       ovarian cancer   Breast cancer Cousin      Current Outpatient Medications:    acetaminophen (TYLENOL) 650 MG CR tablet, Take 650 mg by mouth every 8 (eight) hours as needed for pain., Disp: , Rfl:    amLODipine (NORVASC) 2.5 MG tablet, TAKE 1 TABLET BY  MOUTH EVERY DAY, Disp: 90 tablet, Rfl: 1   Calcium Carbonate-Vitamin D 600-400 MG-UNIT tablet, Take 1 tablet by mouth daily. , Disp: , Rfl:    cholecalciferol (VITAMIN D3) 25 MCG (1000 UNIT) tablet, Take 1,000 Units by mouth daily., Disp: , Rfl:    Multiple Vitamins-Minerals (MULTIVITAMIN WITH MINERALS) tablet, Take 1 tablet by mouth daily., Disp: , Rfl:    tamoxifen (NOLVADEX) 20 MG tablet, Take 1 tablet (20 mg total) by mouth daily., Disp: 90 tablet, Rfl: 1 No current facility-administered medications for this visit.  Facility-Administered Medications Ordered in Other Visits:    sodium chloride 0.9 % 50 mL with famotidine (PEPCID) 20 mg infusion, 20 mg, Intravenous, Once, Brahmanday, Govinda R, MD   sodium chloride flush (NS) 0.9 % injection 10 mL, 10 mL, Intravenous, PRN, Cammie Sickle, MD, 10 mL at 06/24/18 0854  Physical exam:  Vitals:   02/23/22 1058  BP: (!) 128/28  Pulse: 65  Resp: 16  Temp: 97.7 F (36.5 C)  TempSrc: Oral  Weight: 174 lb (78.9 kg)  Height: _0  (1.6 m)   Physical Exam Cardiovascular:     Rate and Rhythm: Normal rate and regular rhythm.     Heart sounds: Normal heart sounds.  Pulmonary:     Effort: Pulmonary effort is normal.     Breath sounds: Normal breath sounds.  Abdominal:     General: Bowel sounds are normal.     Palpations: Abdomen is soft.  Skin:    General: Skin is warm and dry.  Neurological:     Mental Status: She is alert and oriented to person, place, and time.    Breast exam was performed in seated and lying down position. Patient is status post right lumpectomy with a well-healed surgical scar. No evidence of any palpable masses. No evidence of axillary adenopathy. No evidence of any palpable masses or lumps in the left breast. No evidence of leftt axillary adenopathy      Latest Ref Rng & Units 01/30/2022    8:50 AM  CMP  Glucose 70 - 99 mg/dL 83   BUN 6 - 23 mg/dL 15   Creatinine 0.40 - 1.20 mg/dL 0.55   Sodium 135 -  145 mEq/L 140   Potassium 3.5 - 5.1 mEq/L 4.0   Chloride 96 - 112 mEq/L 106   CO2 19 - 32 mEq/L 26   Calcium 8.4 - 10.5 mg/dL 8.4   Total Protein 6.0 - 8.3 g/dL 5.1   Total Bilirubin 0.2 - 1.2 mg/dL 0.4   Alkaline Phos 39 - 117 U/L 40   AST 0 - 37 U/L 21   ALT 0 - 35 U/L 15       Latest Ref Rng & Units 01/30/2022    8:50 AM  CBC  WBC 4.0 - 10.5 K/uL 7.9   Hemoglobin 12.0 - 15.0 g/dL 12.1   Hematocrit 36.0 - 46.0 % 36.7   Platelets 150.0 - 400.0 K/uL 240.0     No images are attached to the encounter.  MM 3D SCREEN BREAST BILATERAL  Result Date: 02/21/2022 CLINICAL DATA:  Screening. EXAM: DIGITAL SCREENING BILATERAL MAMMOGRAM WITH TOMOSYNTHESIS AND CAD TECHNIQUE: Bilateral screening digital craniocaudal and mediolateral oblique mammograms were obtained. Bilateral screening digital breast tomosynthesis was performed. The images were evaluated with computer-aided detection. COMPARISON:  Previous exam(s). ACR Breast Density Category c: The breast tissue is heterogeneously dense, which may obscure small masses. FINDINGS: There are no findings suspicious for malignancy. RIGHT breast surgical changes again noted. IMPRESSION: No mammographic evidence of malignancy. A result letter of this screening mammogram will be mailed directly to the patient. RECOMMENDATION: Screening mammogram in one year. (Code:SM-B-01Y) BI-RADS CATEGORY  2: Benign. Electronically Signed   By: Margarette Canada M.D.   On: 02/21/2022 12:57     Assessment and plan- Patient is a 88 y.o. female with history of right breast cancer ER/PR positive HER2 negativeCurrently on tamoxifen here for routine follow-up  Clinically patient is doing well with no signs and symptoms of recurrence based on today's exam.  She is tolerating tamoxifen well without any significant side effects.  Mammogram from 02/20/2022 did not reveal any evidence of malignancy.  Patient has baseline osteoporosis and we will continue to monitor her bone density every  other year.  She is receiving Prolia every 6 months.  I will see her back in 6 months no labs   Visit Diagnosis 1. Encounter for follow-up surveillance of breast cancer   2. Encounter for monitoring tamoxifen therapy      Dr. Randa Evens, MD, MPH [facility] at [facility] [phone] 03/01/2022 11:50 AM

## 2022-04-09 ENCOUNTER — Encounter (INDEPENDENT_AMBULATORY_CARE_PROVIDER_SITE_OTHER): Payer: Self-pay

## 2022-04-30 ENCOUNTER — Telehealth: Payer: Self-pay

## 2022-04-30 NOTE — Telephone Encounter (Signed)
Patient states both of her legs are swelling and her right leg is a little worse than the left.  I transferred call to Access Nurse.

## 2022-04-30 NOTE — Telephone Encounter (Signed)
Access nurse called back with a disposition of 4 hours. However, we did not have any available appointments. I instructed the patient to go to urgent care. She stated she would go to [facility] urgent [facility].

## 2022-05-01 NOTE — Telephone Encounter (Signed)
Reviewed.  Doing better per note.  Confirm no other acute symptoms - sob, etc.  Continue plan as per Jefm Bryant MD instructions.  Let us know if problems.  Can hold for earlier w/in appt. If acute change in symptoms, will need to be seen

## 2022-05-01 NOTE — Telephone Encounter (Signed)
S/w pt - confirmed was seen at [facility]. Given lasix 1 QD x 7 days Pt states legs seem better today - less swollen. Pt confirmed still no heat, no weeping, no redness.  Pt booked for 1st available - 12/7 at 330 for f/u Can we work her in sooner?

## 2022-05-02 NOTE — Telephone Encounter (Signed)
Noted. Please confirm doing ok and ok with appt Monday.

## 2022-05-07 ENCOUNTER — Ambulatory Visit (INDEPENDENT_AMBULATORY_CARE_PROVIDER_SITE_OTHER): Payer: Medicare HMO | Admitting: Internal Medicine

## 2022-05-07 ENCOUNTER — Encounter: Payer: Self-pay | Admitting: Internal Medicine

## 2022-05-07 VITALS — BP 128/72 | HR 66 | Temp 97.8°F | Resp 17 | Ht 63.0 in | Wt 174.0 lb

## 2022-05-07 DIAGNOSIS — R600 Localized edema: Secondary | ICD-10-CM

## 2022-05-07 DIAGNOSIS — C50411 Malignant neoplasm of upper-outer quadrant of right female breast: Secondary | ICD-10-CM

## 2022-05-07 DIAGNOSIS — D649 Anemia, unspecified: Secondary | ICD-10-CM | POA: Diagnosis not present

## 2022-05-07 DIAGNOSIS — M810 Age-related osteoporosis without current pathological fracture: Secondary | ICD-10-CM

## 2022-05-07 DIAGNOSIS — M25562 Pain in left knee: Secondary | ICD-10-CM

## 2022-05-07 DIAGNOSIS — G620 Drug-induced polyneuropathy: Secondary | ICD-10-CM

## 2022-05-07 DIAGNOSIS — Z170 Estrogen receptor positive status [ER+]: Secondary | ICD-10-CM

## 2022-05-07 DIAGNOSIS — T451X5A Adverse effect of antineoplastic and immunosuppressive drugs, initial encounter: Secondary | ICD-10-CM

## 2022-05-07 DIAGNOSIS — M25561 Pain in right knee: Secondary | ICD-10-CM

## 2022-05-07 DIAGNOSIS — E7800 Pure hypercholesterolemia, unspecified: Secondary | ICD-10-CM

## 2022-05-07 DIAGNOSIS — I10 Essential (primary) hypertension: Secondary | ICD-10-CM | POA: Diagnosis not present

## 2022-05-07 LAB — BASIC METABOLIC PANEL
BUN: 15 mg/dL (ref 6–23)
CO2: 27 mEq/L (ref 19–32)
Calcium: 8.5 mg/dL (ref 8.4–10.5)
Chloride: 106 mEq/L (ref 96–112)
Creatinine, Ser: 0.57 mg/dL (ref 0.40–1.20)
GFR: 82.45 mL/min (ref >60.00)
Glucose, Bld: 92 mg/dL (ref 70–99)
Potassium: 3.8 mEq/L (ref 3.5–5.1)
Sodium: 140 mEq/L (ref 135–145)

## 2022-05-07 LAB — HEPATIC FUNCTION PANEL
ALT: 19 U/L (ref 0–35)
AST: 21 U/L (ref 0–37)
Albumin: 3.4 g/dL — ABNORMAL LOW (ref 3.5–5.2)
Alkaline Phosphatase: 41 U/L (ref 39–117)
Bilirubin, Direct: 0 mg/dL (ref 0.0–0.3)
Total Bilirubin: 0.3 mg/dL (ref 0.2–1.2)
Total Protein: 5.4 g/dL — ABNORMAL LOW (ref 6.0–8.3)

## 2022-05-07 LAB — TSH: TSH: 2.47 u[IU]/mL (ref 0.35–5.50)

## 2022-05-07 NOTE — Assessment & Plan Note (Signed)
Just completed 5 days of lasix. No swelling on exam today.  Lungs clear.  Check metabolic panel.  Follow.  Discussed compression hose.  Leg elevation.  Follow.

## 2022-05-07 NOTE — Assessment & Plan Note (Signed)
Documented - oncology.  Stable.  

## 2022-05-07 NOTE — Assessment & Plan Note (Signed)
Continue tamoxifen.  Followed by oncology.  Mammogram 02/20/22 - ok.

## 2022-05-07 NOTE — Assessment & Plan Note (Signed)
Low cholesterol diet and exercise.  Follow lipid panel.   

## 2022-05-07 NOTE — Assessment & Plan Note (Signed)
Last prolia 01/2022.  Continue.  

## 2022-05-07 NOTE — Assessment & Plan Note (Signed)
Knee pain - limits activity.  Discussed.  Has seen ortho previously.  Given limitation, discussed referral back to ortho for evaluation and to discuss treatment options.

## 2022-05-07 NOTE — Assessment & Plan Note (Signed)
Followed by oncology.  They follow cbc.  

## 2022-05-07 NOTE — Progress Notes (Signed)
Patient ID: [name] [name], female   DOB: 12/27/1933, 88 y.o.   MRN: [id number]   Subjective:    Patient ID: [name] [name], female    DOB: 1934-03-15, 88 y.o.   MRN: [id number]   Patient here for  Chief Complaint  Patient presents with   Follow-up   .   HPI Here to follow up regarding lower extremity swelling. Was seen acute care 04/30/22 - bilateral ankle and lower leg edema.  Swelling worsened over the previous two weeks.  Reports had been sitting more. Knees bother her and limits her activity.  Walks with a cane. Was prescribed lasix '20mg'$  q day x 5 days.  Completed Saturday. Swelling is better.  Denied sob.  No chest pain reported.  Had been eating more pimento cheese. Sees Dr Janese Banks - f/u breast cancer - on tamoxifen.  Last seen 02/23/22 - stable.  Mammogram 02/20/22 - ok.  Last prolia 01/2022.  Had questions regarding prolia.  Discussed the need to continue.  Occasionally will have episodes of loose stool.  Nothing persistent.  No abdominal pain.     Past Medical History:  Diagnosis Date   Breast cancer ([place])    Diverticulitis of sigmoid colon    Endometriosis    Family history of breast cancer    Family history of ovarian cancer    Family history of prostate cancer    Family history of uterine cancer    Fibrocystic breast disease    Glaucoma    H/O urinary incontinence    History of chicken pox    History of colon polyps 2014   Hyperlipidemia    Hypertension    Ovarian cancer ([place]) 2002   Chemo tx's @ [facility] with Total Hysterectomy. Dr Amalia Hailey   Personal history of chemotherapy    ovarian cancer   Personal history of ovarian cancer    Personal history of radiation therapy    Port-A-Cath in place    Sinusitis    Tuberculosis    positve skin test   Past Surgical History:  Procedure Laterality Date   ABDOMINAL HYSTERECTOMY  2003   complete Dr Amalia Hailey   ABSCESS DRAINAGE  2017   right buttock at [place]  2003   BREAST EXCISIONAL BIOPSY Left 1993   excisional bx.  negative results   BREAST LUMPECTOMY Right 03/18/2018   done at [facility] breast ca rad and chemo Invasive ductal carcinoma with focal squamous differentiation, Nottingham grade 3, is identified forming a 6 mm mass, associated with high-grade ductal carcinoma in situ, radial scar and biopsy site changes.   BREAST SURGERY Left 1982   papilloma removal   COLONOSCOPY  2006, 2014   Dr Vira Agar    COLONOSCOPY N/A 03/27/2016   Procedure: COLONOSCOPY;  Surgeon: Robert Bellow, MD;  Location: [facility] ENDOSCOPY;  Service: Endoscopy;  Laterality: N/A;   COLOSTOMY REVERSAL  2014   at [place] Dr Raynaldo Opitz   NASAL SINUS SURGERY     For fungal infection   NASAL SINUS SURGERY  8/05 8/06   OOPHORECTOMY     OSTOMY  05/22/2012   diverticulitis with abcess/ Dr Tamala Julian   ovarian cancer     Exploratory lap   PORTA CATH INSERTION N/A 05/26/2018   Procedure: PORTA CATH INSERTION;  Surgeon: Algernon Huxley, MD;  Location: [place] CV LAB;  Service: Cardiovascular;  Laterality: N/A;   PORTA CATH REMOVAL N/A 11/12/2019   Procedure: PORTA CATH REMOVAL;  Surgeon: Algernon Huxley, MD;  Location: [place] CV LAB;  Service: Cardiovascular;  Laterality: N/A;   TONSILLECTOMY AND ADENOIDECTOMY  1947   Family History  Problem Relation Age of Onset   Hypertension Mother    Coronary artery disease Mother    Heart disease Mother    Kidney disease Mother    Diabetes Mother    Cancer Mother        uterine cancer dx 74s   Diabetes Father    Kidney failure Father    Stroke Sister    Heart disease Sister        Psychologist, forensic   Dementia Sister    Diabetes Brother    Coronary artery disease Brother    Cancer Brother        bladder cancer   Cancer Brother        Prostate   Cancer Maternal Aunt 49       ovarian cancer   Breast cancer Cousin    Social History   Socioeconomic History   Marital status: Widowed    Spouse name: Not on file   Number of children: 0   Years of education: Not on file   Highest education  level: Not on file  Occupational History   Not on file  Tobacco Use   Smoking status: Never   Smokeless tobacco: Never  Vaping Use   Vaping Use: Never used  Substance and Sexual Activity   Alcohol use: No    Alcohol/week: 0.0 standard drinks of alcohol   Drug use: Yes   Sexual activity: Not Currently  Other Topics Concern   Not on file  Social History Narrative   Lives at home alone with dog   Social Determinants of Health   Financial Resource Strain: Low Risk  (12/28/2021)   Overall Financial Resource Strain (CARDIA)    Difficulty of Paying Living Expenses: Not hard at all  Food Insecurity: No Food Insecurity (12/28/2021)   Hunger Vital Sign    Worried About Running Out of Food in the Last Year: Never true    [place] in the Last Year: Never true  Transportation Needs: No Transportation Needs (12/28/2021)   PRAPARE - Hydrologist (Medical): No    Lack of Transportation (Non-Medical): No  Physical Activity: Not on file  Stress: No Stress Concern Present (12/28/2021)   [place]    Feeling of Stress : Not at all  Social Connections: Unknown (12/28/2021)   Social Connection and Isolation Panel [NHANES]    Frequency of Communication with Friends and Family: More than three times a week    Frequency of Social Gatherings with Friends and Family: Once a week    Attends Religious Services: More than 4 times per year    Active Member of [employer] or Organizations: Yes    Attends Music therapist: More than 4 times per year    Marital Status: Not on file     Review of Systems  Constitutional:  Negative for appetite change and unexpected weight change.  HENT:  Negative for congestion and sinus pressure.   Respiratory:  Negative for cough, chest tightness and shortness of breath.   Cardiovascular:  Negative for chest pain and palpitations.       Leg swelling improved.    Gastrointestinal:  Negative for abdominal pain, nausea and vomiting.       Occasional loose stool as outlined.   Genitourinary:  Negative for  difficulty urinating and dysuria.  Musculoskeletal:  Negative for myalgias.       Knee pain as outlined.   Skin:  Negative for color change and rash.  Neurological:  Negative for dizziness, light-headedness and headaches.  Psychiatric/Behavioral:  Negative for agitation and dysphoric mood.        Objective:     BP 128/72   Pulse 66   Temp 97.8 F (36.6 C) (Temporal)   Resp 17   Ht '5\' 3"'$  (1.6 m)   Wt 174 lb (78.9 kg)   SpO2 98%   BMI 30.82 kg/m  Wt Readings from Last 3 Encounters:  05/07/22 174 lb (78.9 kg)  02/23/22 174 lb (78.9 kg)  01/16/22 170 lb 9.6 oz (77.4 kg)    Physical Exam Vitals reviewed.  Constitutional:      General: She is not in acute distress.    Appearance: Normal appearance.  HENT:     Head: Normocephalic and atraumatic.     Right Ear: External ear normal.     Left Ear: External ear normal.  Eyes:     General: No scleral icterus.       Right eye: No discharge.        Left eye: No discharge.     Conjunctiva/sclera: Conjunctivae normal.  Neck:     Thyroid: No thyromegaly.  Cardiovascular:     Rate and Rhythm: Normal rate and regular rhythm.  Pulmonary:     Effort: No respiratory distress.     Breath sounds: Normal breath sounds. No wheezing.  Abdominal:     General: Bowel sounds are normal.     Palpations: Abdomen is soft.     Tenderness: There is no abdominal tenderness.  Musculoskeletal:        General: No swelling or tenderness.     Cervical back: Neck supple. No tenderness.     Comments: No increased pedal, ankle or lower extremity swelling.  DP pulses palpable and equal bilaterally.   Lymphadenopathy:     Cervical: No cervical adenopathy.  Skin:    Findings: No erythema or rash.  Neurological:     Mental Status: She is alert.  Psychiatric:        Mood and Affect: Mood normal.         Behavior: Behavior normal.      Outpatient Encounter Medications as of 05/07/2022  Medication Sig   acetaminophen (TYLENOL) 650 MG CR tablet Take 650 mg by mouth every 8 (eight) hours as needed for pain.   amLODipine (NORVASC) 2.5 MG tablet TAKE 1 TABLET BY MOUTH EVERY DAY   Calcium Carbonate-Vitamin D 600-400 MG-UNIT tablet Take 1 tablet by mouth daily.    cholecalciferol (VITAMIN D3) 25 MCG (1000 UNIT) tablet Take 1,000 Units by mouth daily.   furosemide (LASIX) 20 MG tablet Take by mouth.   Multiple Vitamins-Minerals (MULTIVITAMIN WITH MINERALS) tablet Take 1 tablet by mouth daily.   tamoxifen (NOLVADEX) 20 MG tablet Take 1 tablet (20 mg total) by mouth daily.   Facility-Administered Encounter Medications as of 05/07/2022  Medication   sodium chloride 0.9 % 50 mL with famotidine (PEPCID) 20 mg infusion   sodium chloride flush (NS) 0.9 % injection 10 mL     Lab Results  Component Value Date   WBC 7.9 01/30/2022   HGB 12.1 01/30/2022   HCT 36.7 01/30/2022   PLT 240.0 01/30/2022   GLUCOSE 83 01/30/2022   CHOL 152 01/30/2022   TRIG 194.0 (H) 01/30/2022   HDL 53.40  01/30/2022   LDLCALC 60 01/30/2022   ALT 15 01/30/2022   AST 21 01/30/2022   NA 140 01/30/2022   K 4.0 01/30/2022   CL 106 01/30/2022   CREATININE 0.55 01/30/2022   BUN 15 01/30/2022   CO2 26 01/30/2022   TSH 2.41 12/06/2020   INR 1.0 05/21/2012    MM 3D SCREEN BREAST BILATERAL  Result Date: 02/21/2022 CLINICAL DATA:  Screening. EXAM: DIGITAL SCREENING BILATERAL MAMMOGRAM WITH TOMOSYNTHESIS AND CAD TECHNIQUE: Bilateral screening digital craniocaudal and mediolateral oblique mammograms were obtained. Bilateral screening digital breast tomosynthesis was performed. The images were evaluated with computer-aided detection. COMPARISON:  Previous exam(s). ACR Breast Density Category c: The breast tissue is heterogeneously dense, which may obscure small masses. FINDINGS: There are no findings suspicious for malignancy.  RIGHT breast surgical changes again noted. IMPRESSION: No mammographic evidence of malignancy. A result letter of this screening mammogram will be mailed directly to the patient. RECOMMENDATION: Screening mammogram in one year. (Code:SM-B-01Y) BI-RADS CATEGORY  2: Benign. Electronically Signed   By: Margarette Canada M.D.   On: 02/21/2022 12:57       Assessment & Plan:   Problem List Items Addressed This Visit     Anemia    Followed by oncology.  They follow cbc.       Carcinoma of upper-outer quadrant of right breast in female, estrogen receptor positive ([place])    Continue tamoxifen.  Followed by oncology.  Mammogram 02/20/22 - ok.       Chemotherapy-induced neuropathy ([place])    Documented - oncology.  Stable.       Hypercholesterolemia    Low cholesterol diet and exercise.  Follow lipid panel.       Relevant Medications   furosemide (LASIX) 20 MG tablet   Hypertension, essential    On amlodipine 2.'5mg'$  q day.  Blood pressure as outlined.  Recheck 128/72. Follow pressures.  Follow metabolic panel.       Relevant Medications   furosemide (LASIX) 20 MG tablet   Other Relevant Orders   Basic metabolic panel   Knee pain    Knee pain - limits activity.  Discussed.  Has seen ortho previously.  Given limitation, discussed referral back to ortho for evaluation and to discuss treatment options.       Relevant Orders   Ambulatory referral to Orthopedic Surgery   Lower extremity edema - Primary    Just completed 5 days of lasix. No swelling on exam today.  Lungs clear.  Check metabolic panel.  Follow.  Discussed compression hose.  Leg elevation.  Follow.       Relevant Orders   TSH   Hepatic function panel   Osteoporosis    Last prolia 01/2022.  Continue.         Einar Pheasant, MD

## 2022-05-07 NOTE — Assessment & Plan Note (Signed)
On amlodipine 2.'5mg'$  q day.  Blood pressure as outlined.  Recheck 128/72. Follow pressures.  Follow metabolic panel.

## 2022-05-17 ENCOUNTER — Ambulatory Visit: Payer: Medicare HMO | Admitting: Internal Medicine

## 2022-05-28 ENCOUNTER — Other Ambulatory Visit: Payer: Self-pay | Admitting: Oncology

## 2022-05-28 DIAGNOSIS — Z170 Estrogen receptor positive status [ER+]: Secondary | ICD-10-CM

## 2022-06-11 DIAGNOSIS — Z853 Personal history of malignant neoplasm of breast: Secondary | ICD-10-CM | POA: Insufficient documentation

## 2022-06-13 ENCOUNTER — Other Ambulatory Visit: Payer: Self-pay | Admitting: Internal Medicine

## 2022-06-14 DIAGNOSIS — M25561 Pain in right knee: Secondary | ICD-10-CM | POA: Insufficient documentation

## 2022-07-06 ENCOUNTER — Telehealth: Payer: Self-pay | Admitting: *Deleted

## 2022-07-06 NOTE — Telephone Encounter (Signed)
$  30 due for Prolia; PA on file. Prior Auth Approved   Auth# [id number] Eff: 12/13/21-12/14/22 Good for 2 visits   Pt scheduled

## 2022-07-19 ENCOUNTER — Ambulatory Visit: Payer: Medicare HMO | Admitting: Internal Medicine

## 2022-07-19 ENCOUNTER — Encounter: Payer: Self-pay | Admitting: Internal Medicine

## 2022-07-19 VITALS — BP 124/70 | HR 76 | Temp 97.6°F | Resp 16 | Ht 63.0 in | Wt 176.0 lb

## 2022-07-19 DIAGNOSIS — G620 Drug-induced polyneuropathy: Secondary | ICD-10-CM

## 2022-07-19 DIAGNOSIS — Z170 Estrogen receptor positive status [ER+]: Secondary | ICD-10-CM

## 2022-07-19 DIAGNOSIS — E7800 Pure hypercholesterolemia, unspecified: Secondary | ICD-10-CM | POA: Diagnosis not present

## 2022-07-19 DIAGNOSIS — I10 Essential (primary) hypertension: Secondary | ICD-10-CM | POA: Diagnosis not present

## 2022-07-19 DIAGNOSIS — C50411 Malignant neoplasm of upper-outer quadrant of right female breast: Secondary | ICD-10-CM | POA: Diagnosis not present

## 2022-07-19 DIAGNOSIS — M25561 Pain in right knee: Secondary | ICD-10-CM

## 2022-07-19 DIAGNOSIS — R198 Other specified symptoms and signs involving the digestive system and abdomen: Secondary | ICD-10-CM

## 2022-07-19 DIAGNOSIS — M810 Age-related osteoporosis without current pathological fracture: Secondary | ICD-10-CM

## 2022-07-19 DIAGNOSIS — T451X5A Adverse effect of antineoplastic and immunosuppressive drugs, initial encounter: Secondary | ICD-10-CM

## 2022-07-19 DIAGNOSIS — M25562 Pain in left knee: Secondary | ICD-10-CM

## 2022-07-19 DIAGNOSIS — D649 Anemia, unspecified: Secondary | ICD-10-CM

## 2022-07-19 DIAGNOSIS — F439 Reaction to severe stress, unspecified: Secondary | ICD-10-CM

## 2022-07-19 DIAGNOSIS — Z860100 Personal history of colon polyps, unspecified: Secondary | ICD-10-CM

## 2022-07-19 LAB — BASIC METABOLIC PANEL
BUN: 13 mg/dL (ref 6–23)
CO2: 27 mEq/L (ref 19–32)
Calcium: 8.6 mg/dL (ref 8.4–10.5)
Chloride: 106 mEq/L (ref 96–112)
Creatinine, Ser: 0.61 mg/dL (ref 0.40–1.20)
GFR: 81 mL/min (ref >60.00)
Glucose, Bld: 85 mg/dL (ref 70–99)
Potassium: 3.9 mEq/L (ref 3.5–5.1)
Sodium: 142 mEq/L (ref 135–145)

## 2022-07-19 LAB — LIPID PANEL
Cholesterol: 147 mg/dL (ref 0–200)
HDL: 56.6 mg/dL (ref >39.00)
LDL Cholesterol: 55 mg/dL (ref 0–99)
NonHDL: 89.97
Total CHOL/HDL Ratio: 3
Triglycerides: 176 mg/dL — ABNORMAL HIGH (ref 0.0–149.0)
VLDL: 35.2 mg/dL (ref 0.0–40.0)

## 2022-07-19 LAB — HEPATIC FUNCTION PANEL
ALT: 18 U/L (ref 0–35)
AST: 20 U/L (ref 0–37)
Albumin: 3.5 g/dL (ref 3.5–5.2)
Alkaline Phosphatase: 56 U/L (ref 39–117)
Bilirubin, Direct: 0.1 mg/dL (ref 0.0–0.3)
Total Bilirubin: 0.4 mg/dL (ref 0.2–1.2)
Total Protein: 5.7 g/dL — ABNORMAL LOW (ref 6.0–8.3)

## 2022-07-19 NOTE — Progress Notes (Signed)
Subjective:    Patient ID: [name] [name], female    DOB: [birth year], 89 y.o.   MRN: [id number]  Patient here for  Chief Complaint  Patient presents with   Medical Management of Chronic Issues    HPI Here for follow up regarding history of breast cancer, osteoporosis, hypercholesterolemia and hypertension.  Reports she is doing relatively well.  Evaluated [facility] - did not feel injection helped.  Bilateral knee pain.  Tylenol helps.  Has f/u 08/2023.  No chest pain reported.  Breathing stable.  No abdominal pain reported.  Bowels vary - discussed benefiber.  Had question regarding tamoxifen.  Had questions about continuing tamoxifen.  Contacted hematology.  They will contact her regarding questions.     Past Medical History:  Diagnosis Date   Breast cancer ([place])    Diverticulitis of sigmoid colon    Endometriosis    Family history of breast cancer    Family history of ovarian cancer    Family history of prostate cancer    Family history of uterine cancer    Fibrocystic breast disease    Glaucoma    H/O urinary incontinence    History of chicken pox    History of colon polyps 2014   Hyperlipidemia    Hypertension    Ovarian cancer ([place]) 2002   Chemo tx's @ [facility] with Total Hysterectomy. Dr Amalia Hailey   Personal history of chemotherapy    ovarian cancer   Personal history of ovarian cancer    Personal history of radiation therapy    Port-A-Cath in place    Sinusitis    Tuberculosis    positve skin test   Past Surgical History:  Procedure Laterality Date   ABDOMINAL HYSTERECTOMY  2003   complete Dr Amalia Hailey   ABSCESS DRAINAGE  2017   right buttock at [place]  2003   BREAST EXCISIONAL BIOPSY Left 1993   excisional bx. negative results   BREAST LUMPECTOMY Right 03/18/2018   done at [facility] ca rad and chemo Invasive ductal carcinoma with focal squamous differentiation, Nottingham grade 3, is identified forming a 6 mm mass, associated with high-grade ductal  carcinoma in situ, radial scar and biopsy site changes.   BREAST SURGERY Left 1982   papilloma removal   COLONOSCOPY  2006, 2014   Dr Vira Agar    COLONOSCOPY N/A 03/27/2016   Procedure: COLONOSCOPY;  Surgeon: Robert Bellow, MD;  Location: [facility] ENDOSCOPY;  Service: Endoscopy;  Laterality: N/A;   COLOSTOMY REVERSAL  2014   at [place] Dr Raynaldo Opitz   NASAL SINUS SURGERY     For fungal infection   NASAL SINUS SURGERY  8/05 8/06   OOPHORECTOMY     OSTOMY  05/22/2012   diverticulitis with abcess/ Dr Tamala Julian   ovarian cancer     Exploratory lap   PORTA CATH INSERTION N/A 05/26/2018   Procedure: PORTA CATH INSERTION;  Surgeon: Algernon Huxley, MD;  Location: [place] CV LAB;  Service: Cardiovascular;  Laterality: N/A;   PORTA CATH REMOVAL N/A 11/12/2019   Procedure: PORTA CATH REMOVAL;  Surgeon: Algernon Huxley, MD;  Location: [place] CV LAB;  Service: Cardiovascular;  Laterality: N/A;   TONSILLECTOMY AND ADENOIDECTOMY  1947   Family History  Problem Relation Age of Onset   Hypertension Mother    Coronary artery disease Mother    Heart disease Mother    Kidney disease Mother    Diabetes Mother    Cancer Mother  uterine cancer dx 62s   Diabetes Father    Kidney failure Father    Stroke Sister    Heart disease Sister        Pace Maker   Dementia Sister    Diabetes Brother    Coronary artery disease Brother    Cancer Brother        bladder cancer   Cancer Brother        Prostate   Cancer Maternal Aunt 49       ovarian cancer   Breast cancer Cousin    Social History   Socioeconomic History   Marital status: Widowed    Spouse name: Not on file   Number of children: 0   Years of education: Not on file   Highest education level: Not on file  Occupational History   Not on file  Tobacco Use   Smoking status: Never   Smokeless tobacco: Never  Vaping Use   Vaping Use: Never used  Substance and Sexual Activity   Alcohol use: No    Alcohol/week: 0.0 standard  drinks of alcohol   Drug use: Yes   Sexual activity: Not Currently  Other Topics Concern   Not on file  Social History Narrative   Lives at home alone with dog   Social Determinants of Health   Financial Resource Strain: Low Risk  (12/28/2021)   Overall Financial Resource Strain (CARDIA)    Difficulty of Paying Living Expenses: Not hard at all  Food Insecurity: No Food Insecurity (12/28/2021)   Hunger Vital Sign    Worried About Running Out of Food in the Last Year: Never true    [place] in the Last Year: Never true  Transportation Needs: No Transportation Needs (12/28/2021)   PRAPARE - Hydrologist (Medical): No    Lack of Transportation (Non-Medical): No  Physical Activity: Not on file  Stress: No Stress Concern Present (12/28/2021)   [place]    Feeling of Stress : Not at all  Social Connections: Unknown (12/28/2021)   Social Connection and Isolation Panel [NHANES]    Frequency of Communication with Friends and Family: More than three times a week    Frequency of Social Gatherings with Friends and Family: Once a week    Attends Religious Services: More than 4 times per year    Active Member of [employer] or Organizations: Yes    Attends Music therapist: More than 4 times per year    Marital Status: Not on file     Review of Systems  Constitutional:  Negative for appetite change and unexpected weight change.  HENT:  Negative for congestion and sinus pressure.   Respiratory:  Negative for cough, chest tightness and shortness of breath.   Cardiovascular:  Negative for chest pain, palpitations and leg swelling.  Gastrointestinal:  Negative for abdominal pain, diarrhea, nausea and vomiting.  Genitourinary:  Negative for difficulty urinating and dysuria.  Musculoskeletal:  Negative for myalgias.       Knee pain.    Skin:  Negative for color change and rash.   Neurological:  Negative for dizziness and headaches.  Psychiatric/Behavioral:  Negative for agitation and dysphoric mood.        Objective:     BP 124/70   Pulse 76   Temp 97.6 F (36.4 C)   Resp 16   Ht 5' 3"$  (1.6 m)   Wt 176 lb (  79.8 kg)   SpO2 97%   BMI 31.18 kg/m  Wt Readings from Last 3 Encounters:  07/19/22 176 lb (79.8 kg)  05/07/22 174 lb (78.9 kg)  02/23/22 174 lb (78.9 kg)    Physical Exam Vitals reviewed.  Constitutional:      General: She is not in acute distress.    Appearance: Normal appearance.  HENT:     Head: Normocephalic and atraumatic.     Right Ear: External ear normal.     Left Ear: External ear normal.  Eyes:     General: No scleral icterus.       Right eye: No discharge.        Left eye: No discharge.     Conjunctiva/sclera: Conjunctivae normal.  Neck:     Thyroid: No thyromegaly.  Cardiovascular:     Rate and Rhythm: Normal rate and regular rhythm.  Pulmonary:     Effort: No respiratory distress.     Breath sounds: Normal breath sounds. No wheezing.  Abdominal:     General: Bowel sounds are normal.     Palpations: Abdomen is soft.     Tenderness: There is no abdominal tenderness.  Musculoskeletal:        General: No tenderness or signs of injury.     Cervical back: Neck supple. No tenderness.  Lymphadenopathy:     Cervical: No cervical adenopathy.  Skin:    Findings: No erythema or rash.  Neurological:     Mental Status: She is alert.  Psychiatric:        Mood and Affect: Mood normal.        Behavior: Behavior normal.      Outpatient Encounter Medications as of 07/19/2022  Medication Sig   acetaminophen (TYLENOL) 650 MG CR tablet Take 650 mg by mouth every 8 (eight) hours as needed for pain.   amLODipine (NORVASC) 2.5 MG tablet TAKE 1 TABLET BY MOUTH EVERY DAY   Calcium Carbonate-Vitamin D 600-400 MG-UNIT tablet Take 1 tablet by mouth daily.    cholecalciferol (VITAMIN D3) 25 MCG (1000 UNIT) tablet Take 1,000 Units by  mouth daily.   furosemide (LASIX) 20 MG tablet Take by mouth.   Multiple Vitamins-Minerals (MULTIVITAMIN WITH MINERALS) tablet Take 1 tablet by mouth daily.   tamoxifen (NOLVADEX) 20 MG tablet Take 1 tablet (20 mg total) by mouth daily.   Facility-Administered Encounter Medications as of 07/19/2022  Medication   sodium chloride 0.9 % 50 mL with famotidine (PEPCID) 20 mg infusion   sodium chloride flush (NS) 0.9 % injection 10 mL     Lab Results  Component Value Date   WBC 7.9 01/30/2022   HGB 12.1 01/30/2022   HCT 36.7 01/30/2022   PLT 240.0 01/30/2022   GLUCOSE 85 07/19/2022   CHOL 147 07/19/2022   TRIG 176.0 (H) 07/19/2022   HDL 56.60 07/19/2022   LDLCALC 55 07/19/2022   ALT 18 07/19/2022   AST 20 07/19/2022   NA 142 07/19/2022   K 3.9 07/19/2022   CL 106 07/19/2022   CREATININE 0.61 07/19/2022   BUN 13 07/19/2022   CO2 27 07/19/2022   TSH 2.47 05/07/2022   INR 1.0 05/21/2012    MM 3D SCREEN BREAST BILATERAL  Result Date: 02/21/2022 CLINICAL DATA:  Screening. EXAM: DIGITAL SCREENING BILATERAL MAMMOGRAM WITH TOMOSYNTHESIS AND CAD TECHNIQUE: Bilateral screening digital craniocaudal and mediolateral oblique mammograms were obtained. Bilateral screening digital breast tomosynthesis was performed. The images were evaluated with computer-aided detection. COMPARISON:  Previous exam(s). ACR  Breast Density Category c: The breast tissue is heterogeneously dense, which may obscure small masses. FINDINGS: There are no findings suspicious for malignancy. RIGHT breast surgical changes again noted. IMPRESSION: No mammographic evidence of malignancy. A result letter of this screening mammogram will be mailed directly to the patient. RECOMMENDATION: Screening mammogram in one year. (Code:SM-B-01Y) BI-RADS CATEGORY  2: Benign. Electronically Signed   By: Margarette Canada M.D.   On: 02/21/2022 12:57       Assessment & Plan:  Anemia, unspecified type Assessment & Plan: Followed by oncology.  They  follow cbc.    Hypertension, essential Assessment & Plan: On amlodipine 2.20m q day.  Blood pressure as outlined. Follow pressures.  Follow metabolic panel.   Orders: -     Basic metabolic panel  Hypercholesterolemia Assessment & Plan: Low cholesterol diet and exercise.  Follow lipid panel.   Orders: -     Lipid panel -     Hepatic function panel  Carcinoma of upper-outer quadrant of right breast in female, estrogen receptor positive ([place] Assessment & Plan: On tamoxifen.  Followed by oncology.  Mammogram 02/20/22 - ok. Had questions about tamoxifen.  Contacted Dr YTasia Catchings They will contact pt.     Chemotherapy-induced neuropathy ([place] Assessment & Plan: Documented - oncology.  Stable.    History of colonic polyps Assessment & Plan: Colonoscopy 03/2016 - normal.     Pain in both knees, unspecified chronicity Assessment & Plan: Knee pain - limits activity.  Discussed.  Has seen ortho.  S/p injection.  Did not help.  Plans to f/u 08/2022.    Osteoporosis without current pathological fracture, unspecified osteoporosis type Assessment & Plan: Last prolia 01/2022.  Continue.    Stress Assessment & Plan: Handling stress.  Follow.     Change in bowel movement Assessment & Plan: Discussed benefiber.  Follow.       CEinar Pheasant MD

## 2022-07-19 NOTE — Patient Instructions (Signed)
Benefiber - daily (to help regulate bowels)

## 2022-07-22 ENCOUNTER — Encounter: Payer: Self-pay | Admitting: Internal Medicine

## 2022-07-22 DIAGNOSIS — R198 Other specified symptoms and signs involving the digestive system and abdomen: Secondary | ICD-10-CM | POA: Insufficient documentation

## 2022-07-22 NOTE — Assessment & Plan Note (Signed)
Handling stress.  Follow.   

## 2022-07-22 NOTE — Assessment & Plan Note (Signed)
Last prolia 01/2022.  Continue.

## 2022-07-22 NOTE — Assessment & Plan Note (Signed)
On amlodipine 2.69m q day.  Blood pressure as outlined. Follow pressures.  Follow metabolic panel.

## 2022-07-22 NOTE — Assessment & Plan Note (Signed)
Discussed benefiber.  Follow.

## 2022-07-22 NOTE — Assessment & Plan Note (Signed)
Documented - oncology.  Stable.

## 2022-07-22 NOTE — Assessment & Plan Note (Signed)
Low cholesterol diet and exercise.  Follow lipid panel.   

## 2022-07-22 NOTE — Assessment & Plan Note (Signed)
Followed by oncology.  They follow cbc.

## 2022-07-22 NOTE — Assessment & Plan Note (Signed)
Knee pain - limits activity.  Discussed.  Has seen ortho.  S/p injection.  Did not help.  Plans to f/u 08/2022.

## 2022-07-22 NOTE — Assessment & Plan Note (Signed)
Colonoscopy 03/2016 - normal.

## 2022-07-22 NOTE — Assessment & Plan Note (Signed)
On tamoxifen.  Followed by oncology.  Mammogram 02/20/22 - ok. Had questions about tamoxifen.  Contacted Dr Tasia Catchings. They will contact pt.

## 2022-08-02 ENCOUNTER — Ambulatory Visit (INDEPENDENT_AMBULATORY_CARE_PROVIDER_SITE_OTHER): Payer: Medicare HMO

## 2022-08-02 DIAGNOSIS — M810 Age-related osteoporosis without current pathological fracture: Secondary | ICD-10-CM

## 2022-08-02 MED ORDER — DENOSUMAB 60 MG/ML ~~LOC~~ SOSY
60.0000 mg | PREFILLED_SYRINGE | Freq: Once | SUBCUTANEOUS | Status: AC
  Administered 2022-08-02: 60 mg via SUBCUTANEOUS

## 2022-08-02 NOTE — Progress Notes (Cosign Needed Addendum)
Patient arrived for her Prolia injection. Patient was administered Prolia into her left arm  SQ. Patient tolerated injection well and did not show any signs of distress or voice any concerns.

## 2022-08-28 ENCOUNTER — Encounter: Payer: Self-pay | Admitting: Hematology and Oncology

## 2022-08-28 ENCOUNTER — Encounter: Payer: Self-pay | Admitting: Nurse Practitioner

## 2022-08-28 ENCOUNTER — Encounter: Payer: Self-pay | Admitting: Internal Medicine

## 2022-08-28 ENCOUNTER — Inpatient Hospital Stay: Payer: Medicare HMO | Attending: Oncology | Admitting: Nurse Practitioner

## 2022-08-28 VITALS — BP 152/58 | HR 70 | Temp 96.5°F | Resp 20 | Wt 183.6 lb

## 2022-08-28 DIAGNOSIS — Z8543 Personal history of malignant neoplasm of ovary: Secondary | ICD-10-CM | POA: Insufficient documentation

## 2022-08-28 DIAGNOSIS — M1810 Unilateral primary osteoarthritis of first carpometacarpal joint, unspecified hand: Secondary | ICD-10-CM | POA: Diagnosis not present

## 2022-08-28 DIAGNOSIS — Z90722 Acquired absence of ovaries, bilateral: Secondary | ICD-10-CM | POA: Diagnosis not present

## 2022-08-28 DIAGNOSIS — Z5181 Encounter for therapeutic drug level monitoring: Secondary | ICD-10-CM

## 2022-08-28 DIAGNOSIS — C50919 Malignant neoplasm of unspecified site of unspecified female breast: Secondary | ICD-10-CM | POA: Insufficient documentation

## 2022-08-28 DIAGNOSIS — C50411 Malignant neoplasm of upper-outer quadrant of right female breast: Secondary | ICD-10-CM

## 2022-08-28 DIAGNOSIS — Z90710 Acquired absence of both cervix and uterus: Secondary | ICD-10-CM | POA: Insufficient documentation

## 2022-08-28 DIAGNOSIS — Z853 Personal history of malignant neoplasm of breast: Secondary | ICD-10-CM | POA: Diagnosis not present

## 2022-08-28 DIAGNOSIS — Z9221 Personal history of antineoplastic chemotherapy: Secondary | ICD-10-CM | POA: Insufficient documentation

## 2022-08-28 DIAGNOSIS — Z79810 Long term (current) use of selective estrogen receptor modulators (SERMs): Secondary | ICD-10-CM | POA: Diagnosis not present

## 2022-08-28 DIAGNOSIS — C50911 Malignant neoplasm of unspecified site of right female breast: Secondary | ICD-10-CM | POA: Diagnosis present

## 2022-08-28 DIAGNOSIS — M810 Age-related osteoporosis without current pathological fracture: Secondary | ICD-10-CM | POA: Diagnosis not present

## 2022-08-28 DIAGNOSIS — Z923 Personal history of irradiation: Secondary | ICD-10-CM | POA: Insufficient documentation

## 2022-08-28 DIAGNOSIS — Z170 Estrogen receptor positive status [ER+]: Secondary | ICD-10-CM

## 2022-08-28 MED ORDER — TAMOXIFEN CITRATE 20 MG PO TABS: 20.0000 mg | ORAL_TABLET | Freq: Every day | ORAL | 3 refills | Status: DC

## 2022-08-28 NOTE — Progress Notes (Signed)
Hematology/Oncology Consult Note [facility]  [phone] [phone]  Patient Care Team: Einar Pheasant, MD as PCP - General (Internal Medicine) Caryl Bis Angela Adam, MD as Consulting Physician (Family Medicine) Christene Lye, MD (General Surgery) Einar Pheasant, MD (Internal Medicine) Bary Castilla, Forest Gleason, MD (General Surgery) Noreene Filbert, MD as Referring Physician (Radiation Oncology) Delana Meyer, Dolores Lory, MD as Consulting Physician (Vascular Surgery) Sindy Guadeloupe, MD as Consulting Physician (Hematology and Oncology)   Name of the patient: [name] [name]  [id number]  [birth year]   Date of visit: 08/28/22  Diagnosis-stage I right breast cancer in 2019  Chief complaint/ Reason for visit- routine follow-up of breast cancer  Heme/Onc history: Patient is a 89 year old female who was diagnosed with stage I HER2 positiveRight breast cancer in 2019 s/p lumpectomy and sentinel lymph node biopsy.  Final pathology showed 0.6 cm grade 3 invasive mammary carcinoma ER 99% positive PR 25% positive and HER2 positive by FISH.  She received 12 weeks of Herceptin and Taxol followed by weekly Herceptin for 1 year.  She also received adjuvant radiation treatment she is currently on tamoxifen for hormone therapy   Also has a history of stage IIb ovarian cancer s/p exploratory laparotomy TAH/BSO omentectomy peritoneal biopsies and pelvic and periaortic lymph node sampling back in 2003.  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  She received adjuvant CarboTaxol.   Baseline osteoporosis on bone density.  Genetic testing was negative - [facility] Common Hereditary Cancer Panel 01/16/2019.   Interval history- Patient is 89 year old female who returns to clinic for continued surveillance of breast cancer. She was previously tolerating tamoxifen well but after a worsening of her arthritis, she was under impression that she was taking a  break from tamoxifen and hasn't been taking it for past month or 2 when she ran out of medication. She has ongoing right knee pain and has been told she has OA with bone on bone. No improvement with injections and she uses a cane for ambulation. She receives prolia with her pcp, Dr Nicki Reaper. She doesn't perform breast exams but denies breast pain, skin changes. No physical complaints beyond her arthritis. Cold and sitting make her pain worse. No interval falls or fractures. She continues calcium and vitamin D.   ECOG PS- 1 Pain scale- 0  Review of systems- Review of Systems  Constitutional:  Negative for chills, fever, malaise/fatigue and weight loss.  Respiratory:  Negative for cough, hemoptysis, sputum production, shortness of breath and wheezing.   Cardiovascular:  Negative for chest pain, palpitations, orthopnea and claudication.  Gastrointestinal:  Negative for abdominal pain, blood in stool, constipation, diarrhea, heartburn, melena, nausea and vomiting.  Genitourinary:  Negative for dysuria, flank pain, frequency, hematuria and urgency.  Musculoskeletal:  Positive for joint pain. Negative for back pain, falls and myalgias.  Skin:  Negative for rash.  Neurological:  Negative for dizziness, tingling, focal weakness, seizures, weakness and headaches.  Endo/Heme/Allergies:  Does not bruise/bleed easily.  Psychiatric/Behavioral:  Negative for depression and suicidal ideas. The patient is not nervous/anxious and does not have insomnia.     No Known Allergies  Past Medical History:  Diagnosis Date   Breast cancer ([place])    Diverticulitis of sigmoid colon    Endometriosis    Family history of breast cancer    Family history of ovarian cancer    Family history of prostate cancer    Family history of uterine cancer    Fibrocystic breast disease  Glaucoma    H/O urinary incontinence    History of chicken pox    History of colon polyps 2014   Hyperlipidemia    Hypertension    Ovarian  cancer ([place]) 2002   Chemo tx's @ [facility] with Total Hysterectomy. Dr Amalia Hailey   Personal history of chemotherapy    ovarian cancer   Personal history of ovarian cancer    Personal history of radiation therapy    Port-A-Cath in place    Sinusitis    Tuberculosis    positve skin test   Past Surgical History:  Procedure Laterality Date   ABDOMINAL HYSTERECTOMY  2003   complete Dr Amalia Hailey   ABSCESS DRAINAGE  2017   right buttock at [place]  2003   BREAST EXCISIONAL BIOPSY Left 1993   excisional bx. negative results   BREAST LUMPECTOMY Right 03/18/2018   done at [facility] breast ca rad and chemo Invasive ductal carcinoma with focal squamous differentiation, Nottingham grade 3, is identified forming a 6 mm mass, associated with high-grade ductal carcinoma in situ, radial scar and biopsy site changes.   BREAST SURGERY Left 1982   papilloma removal   COLONOSCOPY  2006, 2014   Dr Vira Agar    COLONOSCOPY N/A 03/27/2016   Procedure: COLONOSCOPY;  Surgeon: Robert Bellow, MD;  Location: [facility] ENDOSCOPY;  Service: Endoscopy;  Laterality: N/A;   COLOSTOMY REVERSAL  2014   at [place] Dr Raynaldo Opitz   NASAL SINUS SURGERY     For fungal infection   NASAL SINUS SURGERY  8/05 8/06   OOPHORECTOMY     OSTOMY  05/22/2012   diverticulitis with abcess/ Dr Tamala Julian   ovarian cancer     Exploratory lap   PORTA CATH INSERTION N/A 05/26/2018   Procedure: PORTA CATH INSERTION;  Surgeon: Algernon Huxley, MD;  Location: [place] [facility];  Service: Cardiovascular;  Laterality: N/A;   PORTA CATH REMOVAL N/A 11/12/2019   Procedure: PORTA CATH REMOVAL;  Surgeon: Algernon Huxley, MD;  Location: [place] [facility];  Service: Cardiovascular;  Laterality: N/A;   TONSILLECTOMY AND ADENOIDECTOMY  1947   Social History   Socioeconomic History   Marital status: Widowed    Spouse name: Not on file   Number of children: 0   Years of education: Not on file   Highest education level: Not on file  Occupational History    Not on file  Tobacco Use   Smoking status: Never   Smokeless tobacco: Never  Vaping Use   Vaping Use: Never used  Substance and Sexual Activity   Alcohol use: No    Alcohol/week: 0.0 standard drinks of alcohol   Drug use: Yes   Sexual activity: Not Currently  Other Topics Concern   Not on file  Social History Narrative   Lives at home alone with dog   Social Determinants of Health   Financial Resource Strain: Low Risk  (12/28/2021)   Overall Financial Resource Strain (CARDIA)    Difficulty of Paying Living Expenses: Not hard at all  Food Insecurity: No Food Insecurity (12/28/2021)   Hunger Vital Sign    Worried About Running Out of Food in the Last Year: Never true    [place] in the Last Year: Never true  Transportation Needs: No Transportation Needs (12/28/2021)   PRAPARE - Hydrologist (Medical): No    Lack of Transportation (Non-Medical): No  Physical Activity: Not on file  Stress:  No Stress Concern Present (12/28/2021)   [place]    Feeling of Stress : Not at all  Social Connections: Unknown (12/28/2021)   Social Connection and Isolation Panel [NHANES]    Frequency of Communication with Friends and Family: More than three times a week    Frequency of Social Gatherings with Friends and Family: Once a week    Attends Religious Services: More than 4 times per year    Active Member of [employer] or Organizations: Yes    Attends Music therapist: More than 4 times per year    Marital Status: Not on file  Intimate Partner Violence: Not At Risk (12/28/2021)   Humiliation, Afraid, Rape, and Kick questionnaire    Fear of Current or Ex-Partner: No    Emotionally Abused: No    Physically Abused: No    Sexually Abused: No   Family History  Problem Relation Age of Onset   Hypertension Mother    Coronary artery disease Mother    Heart disease Mother    Kidney disease  Mother    Diabetes Mother    Cancer Mother        uterine cancer dx 36s   Diabetes Father    Kidney failure Father    Stroke Sister    Heart disease Sister        Psychologist, forensic   Dementia Sister    Diabetes Brother    Coronary artery disease Brother    Cancer Brother        bladder cancer   Cancer Brother        Prostate   Cancer Maternal Aunt 49       ovarian cancer   Breast cancer Cousin     Current Outpatient Medications:    acetaminophen (TYLENOL) 650 MG CR tablet, Take 650 mg by mouth every 8 (eight) hours as needed for pain., Disp: , Rfl:    amLODipine (NORVASC) 2.5 MG tablet, TAKE 1 TABLET BY MOUTH EVERY DAY, Disp: 90 tablet, Rfl: 1   Calcium Carbonate-Vitamin D 600-400 MG-UNIT tablet, Take 1 tablet by mouth daily. , Disp: , Rfl:    cholecalciferol (VITAMIN D3) 25 MCG (1000 UNIT) tablet, Take 1,000 Units by mouth daily., Disp: , Rfl:    Multiple Vitamins-Minerals (MULTIVITAMIN WITH MINERALS) tablet, Take 1 tablet by mouth daily., Disp: , Rfl:    furosemide (LASIX) 20 MG tablet, Take by mouth. (Patient not taking: Reported on 08/28/2022), Disp: , Rfl:    tamoxifen (NOLVADEX) 20 MG tablet, Take 1 tablet (20 mg total) by mouth daily. (Patient not taking: Reported on 08/28/2022), Disp: 90 tablet, Rfl: 1 No current facility-administered medications for this visit.  Facility-Administered Medications Ordered in Other Visits:    sodium chloride 0.9 % 50 mL with famotidine (PEPCID) 20 mg infusion, 20 mg, Intravenous, Once, Brahmanday, Govinda R, MD   sodium chloride flush (NS) 0.9 % injection 10 mL, 10 mL, Intravenous, PRN, Charlaine Dalton R, MD, 10 mL at 06/24/18 0854  Physical exam:  Vitals:   08/28/22 1144  BP: (!) 152/58  Pulse: 70  Resp: 20  Temp: (!) 96.5 F (35.8 C)  SpO2: 100%  Weight: 183 lb 9.6 oz (83.3 kg)   Physical Exam Constitutional:      General: She is not in acute distress.    Appearance: She is not ill-appearing.  Eyes:     General: No scleral  icterus. Pulmonary:     Effort: Pulmonary effort  is normal.     Breath sounds: Normal breath sounds.  Chest:     Comments: Breast exam- deferred Abdominal:     General: There is no distension.     Palpations: Abdomen is soft.     Tenderness: There is no guarding.  Musculoskeletal:     Comments: Slowed mobility. Obvious discomfort of right leg.   Skin:    General: Skin is warm and dry.  Neurological:     Mental Status: She is alert and oriented to person, place, and time.  Psychiatric:        Mood and Affect: Mood normal.        Behavior: Behavior normal.        Latest Ref Rng & Units 07/19/2022   10:05 AM  CMP  Glucose 70 - 99 mg/dL 85   BUN 6 - 23 mg/dL 13   Creatinine 0.40 - 1.20 mg/dL 0.61   Sodium 135 - 145 mEq/L 142   Potassium 3.5 - 5.1 mEq/L 3.9   Chloride 96 - 112 mEq/L 106   CO2 19 - 32 mEq/L 27   Calcium 8.4 - 10.5 mg/dL 8.6   Total Protein 6.0 - 8.3 g/dL 5.7   Total Bilirubin 0.2 - 1.2 mg/dL 0.4   Alkaline Phos 39 - 117 U/L 56   AST 0 - 37 U/L 20   ALT 0 - 35 U/L 18       Latest Ref Rng & Units 01/30/2022    8:50 AM  CBC  WBC 4.0 - 10.5 K/uL 7.9   Hemoglobin 12.0 - 15.0 g/dL 12.1   Hematocrit 36.0 - 46.0 % 36.7   Platelets 150.0 - 400.0 K/uL 240.0    No images are attached to the encounter.  No results found.  02/21/22- MM 3D Screening Mammogram Bilateral  Narrative & Impression  CLINICAL DATA:  Screening.   EXAM: DIGITAL SCREENING BILATERAL MAMMOGRAM WITH TOMOSYNTHESIS AND CAD TECHNIQUE: Bilateral screening digital craniocaudal and mediolateral oblique mammograms were obtained. Bilateral screening digital breast tomosynthesis was performed. The images were evaluated with computer-aided detection. COMPARISON:  Previous exam(s). ACR Breast Density Category c: The breast tissue is heterogeneously dense, which may obscure small masses. FINDINGS: There are no findings suspicious for malignancy. RIGHT breast surgical changes again noted. IMPRESSION: No  mammographic evidence of malignancy. A result letter of this screening mammogram will be mailed directly to the patient. RECOMMENDATION: Screening mammogram in one year. (Code:SM-B-01Y) BI-RADS CATEGORY  2: Benign. Electronically Signed   By: Margarette Canada M.D.   On: 02/21/2022 12:57    Assessment and plan- Patient is a 89 y.o. female   History of right breast cancer - Stage I, ER/PR positive HER2 positive s/p lumpectomy and slnbx invasive mammary carcinoma of right breast. She received 12 weeks of herceptin and taxol followed by weekly herceptin x 1 year and adjuvant radiation.  Currently on tamoxifen here for routine follow-up. Started tamoxifen June 2020 per notes. Plan is to complete 5 years of tamoxifen, completing June 2025. Tolerating well though possible exacerbation of arthritis symptoms? Unclear. Patient agrees to restart tamoxifen and if she develops worsening OA symptoms, she will contact the clinic. Her last mammogram was 02/21/22 and reported as bi-rads category 2: negative. Tamoxifen refilled today.  Osteoporosis- patient has baseline osteoporosis prior to starting endocrine therapy. Based on this, tamoxifen was initiated. Additionally, she received prolia with her pcp every 6 months. She takes calcium and vitamin d. Her last bone density scan was 04/06/21 which was  reported as t score -2.4, improved, consistent with osteopenia. Continue prolia, tamoxifen, calcium, vitamin d.   Disposition:  September mammogram 6 mo- Dr Janese Banks for breast cancer surveillance October bone density  Visit Diagnosis 1. Encounter for follow-up surveillance of breast cancer   2. Encounter for monitoring tamoxifen therapy   3. Carcinoma of upper-outer quadrant of right breast in female, estrogen receptor positive ([place])   4. Osteoporosis without current pathological fracture, unspecified osteoporosis type    Thank you for allowing me to participate in the care of your patient.   Beckey Rutter, DNP, AGNP-C,  [place] at [facility] [phone] (clinic) 08/28/2022

## 2022-09-12 ENCOUNTER — Telehealth: Payer: Self-pay

## 2022-09-12 NOTE — Telephone Encounter (Signed)
Please go ahead and schedule and will see if needs any further referral.  Thanks

## 2022-09-12 NOTE — Telephone Encounter (Signed)
FYI received pre op form. She is tentatively scheduled for right TKA on 5/16. Will need pre op appointment but wanted you to be aware in case she needed to see any one else prior to surgery as well.

## 2022-09-13 NOTE — Telephone Encounter (Signed)
Pre op scheduled for 4/25

## 2022-10-04 ENCOUNTER — Ambulatory Visit: Payer: Medicare HMO | Admitting: Internal Medicine

## 2022-10-04 ENCOUNTER — Encounter: Payer: Self-pay | Admitting: Internal Medicine

## 2022-10-04 VITALS — BP 134/72 | HR 60 | Temp 97.9°F | Resp 16 | Ht 63.0 in | Wt 173.0 lb

## 2022-10-04 DIAGNOSIS — M810 Age-related osteoporosis without current pathological fracture: Secondary | ICD-10-CM

## 2022-10-04 DIAGNOSIS — Z01818 Encounter for other preprocedural examination: Secondary | ICD-10-CM | POA: Diagnosis not present

## 2022-10-04 DIAGNOSIS — C50919 Malignant neoplasm of unspecified site of unspecified female breast: Secondary | ICD-10-CM | POA: Diagnosis not present

## 2022-10-04 DIAGNOSIS — I10 Essential (primary) hypertension: Secondary | ICD-10-CM

## 2022-10-04 DIAGNOSIS — T451X5A Adverse effect of antineoplastic and immunosuppressive drugs, initial encounter: Secondary | ICD-10-CM

## 2022-10-04 DIAGNOSIS — E7800 Pure hypercholesterolemia, unspecified: Secondary | ICD-10-CM

## 2022-10-04 DIAGNOSIS — G620 Drug-induced polyneuropathy: Secondary | ICD-10-CM | POA: Diagnosis not present

## 2022-10-04 DIAGNOSIS — D649 Anemia, unspecified: Secondary | ICD-10-CM | POA: Diagnosis not present

## 2022-10-04 LAB — CBC WITH DIFFERENTIAL/PLATELET
Basophils Absolute: 0.1 10*3/uL (ref 0.0–0.1)
Basophils Relative: 0.7 % (ref 0.0–3.0)
Eosinophils Absolute: 0 10*3/uL (ref 0.0–0.7)
Eosinophils Relative: 0.6 % (ref 0.0–5.0)
HCT: 39.4 % (ref 36.0–46.0)
Hemoglobin: 12.7 g/dL (ref 12.0–15.0)
Lymphocytes Relative: 29.7 % (ref 12.0–46.0)
Lymphs Abs: 2.5 10*3/uL (ref 0.7–4.0)
MCHC: 32.3 g/dL (ref 30.0–36.0)
MCV: 93.5 fl (ref 78.0–100.0)
Monocytes Absolute: 0.8 10*3/uL (ref 0.1–1.0)
Monocytes Relative: 9.4 % (ref 3.0–12.0)
Neutro Abs: 5 10*3/uL (ref 1.4–7.7)
Neutrophils Relative %: 59.6 % (ref 43.0–77.0)
Platelets: 264 10*3/uL (ref 150.0–400.0)
RBC: 4.21 Mil/uL (ref 3.87–5.11)
RDW: 14.5 % (ref 11.5–15.5)
WBC: 8.3 10*3/uL (ref 4.0–10.5)

## 2022-10-04 LAB — BASIC METABOLIC PANEL
BUN: 12 mg/dL (ref 6–23)
CO2: 27 mEq/L (ref 19–32)
Calcium: 8.9 mg/dL (ref 8.4–10.5)
Chloride: 107 mEq/L (ref 96–112)
Creatinine, Ser: 0.61 mg/dL (ref 0.40–1.20)
GFR: 80.88 mL/min (ref >60.00)
Glucose, Bld: 86 mg/dL (ref 70–99)
Potassium: 3.9 mEq/L (ref 3.5–5.1)
Sodium: 143 mEq/L (ref 135–145)

## 2022-10-04 LAB — HEMOGLOBIN A1C: Hgb A1c MFr Bld: 5.7 % (ref 4.6–6.5)

## 2022-10-04 MED ORDER — AMLODIPINE BESYLATE 2.5 MG PO TABS: 2.5000 mg | ORAL_TABLET | Freq: Every day | ORAL | 1 refills | Status: DC

## 2022-10-04 NOTE — Progress Notes (Signed)
Subjective:    Patient ID: [name] [name], female    DOB: Mar 21, 1934, 89 y.o.   MRN: [id number]  Patient here for  Chief Complaint  Patient presents with   Pre-op Exam    Right TKA with Dr Odis Luster scheduled 10/25/22.     HPI Here for pre op evaluation.  Scheduled for TKA 5/16.  Pre op labs and EKG requested.  Increased pain - knee.  Limits her activity.  No chest pain or sob.  Breathing stable.  No increased cough or congestion.  No acid reflux reported.  No abdominal pain.  Bowels moving.  Saw eye MD.  Has cataracts.     Past Medical History:  Diagnosis Date   Breast cancer (HCC)    Diverticulitis of sigmoid colon    Endometriosis    Family history of breast cancer    Family history of ovarian cancer    Family history of prostate cancer    Family history of uterine cancer    Fibrocystic breast disease    Glaucoma    H/O urinary incontinence    History of chicken pox    History of colon polyps 2014   Hyperlipidemia    Hypertension    Ovarian cancer (HCC) 2002   Chemo tx's @ [facility] with Total Hysterectomy. Dr Logan Bores   Personal history of chemotherapy    ovarian cancer   Personal history of ovarian cancer    Personal history of radiation therapy    Port-A-Cath in place    Sinusitis    Tuberculosis    positve skin test   Past Surgical History:  Procedure Laterality Date   ABDOMINAL HYSTERECTOMY  2003   complete Dr Logan Bores   ABSCESS DRAINAGE  2017   right buttock at [facility]   APPENDECTOMY  2003   BREAST EXCISIONAL BIOPSY Left 1993   excisional bx. negative results   BREAST LUMPECTOMY Right 03/18/2018   done at [facility] breast ca rad and chemo Invasive ductal carcinoma with focal squamous differentiation, Nottingham grade 3, is identified forming a 6 mm mass, associated with high-grade ductal carcinoma in situ, radial scar and biopsy site changes.   BREAST SURGERY Left 1982   papilloma removal   COLONOSCOPY  2006, 2014   Dr Mechele Collin    COLONOSCOPY N/A 03/27/2016   Procedure:  COLONOSCOPY;  Surgeon: Earline Mayotte, MD;  Location: [facility] ENDOSCOPY;  Service: Endoscopy;  Laterality: N/A;   COLOSTOMY REVERSAL  2014   at [facility] Dr Joaquim Nam   NASAL SINUS SURGERY     For fungal infection   NASAL SINUS SURGERY  8/05 8/06   OOPHORECTOMY     OSTOMY  05/22/2012   diverticulitis with abcess/ Dr Katrinka Blazing   ovarian cancer     Exploratory lap   PORTA CATH INSERTION N/A 05/26/2018   Procedure: PORTA CATH INSERTION;  Surgeon: Annice Needy, MD;  Location: [facility] INVASIVE CV LAB;  Service: Cardiovascular;  Laterality: N/A;   PORTA CATH REMOVAL N/A 11/12/2019   Procedure: PORTA CATH REMOVAL;  Surgeon: Annice Needy, MD;  Location: [facility] INVASIVE CV LAB;  Service: Cardiovascular;  Laterality: N/A;   TONSILLECTOMY AND ADENOIDECTOMY  1947   Family History  Problem Relation Age of Onset   Hypertension Mother    Coronary artery disease Mother    Heart disease Mother    Kidney disease Mother    Diabetes Mother    Cancer Mother        uterine cancer dx 66s  Diabetes Father    Kidney failure Father    Stroke Sister    Heart disease Sister        Pace Maker   Dementia Sister    Diabetes Brother    Coronary artery disease Brother    Cancer Brother        bladder cancer   Cancer Brother        Prostate   Cancer Maternal Aunt 49       ovarian cancer   Breast cancer Cousin    Social History   Socioeconomic History   Marital status: Widowed    Spouse name: Not on file   Number of children: 0   Years of education: Not on file   Highest education level: Not on file  Occupational History   Not on file  Tobacco Use   Smoking status: Never   Smokeless tobacco: Never  Vaping Use   Vaping Use: Never used  Substance and Sexual Activity   Alcohol use: No    Alcohol/week: 0.0 standard drinks of alcohol   Drug use: Yes   Sexual activity: Not Currently  Other Topics Concern   Not on file  Social History Narrative   Lives at home alone with dog   Social Determinants of  Health   Financial Resource Strain: Low Risk  (12/28/2021)   Overall Financial Resource Strain (CARDIA)    Difficulty of Paying Living Expenses: Not hard at all  Food Insecurity: No Food Insecurity (12/28/2021)   Hunger Vital Sign    Worried About Running Out of Food in the Last Year: Never true    Ran Out of Food in the Last Year: Never true  Transportation Needs: No Transportation Needs (12/28/2021)   PRAPARE - Administrator, Civil Service (Medical): No    Lack of Transportation (Non-Medical): No  Physical Activity: Not on file  Stress: No Stress Concern Present (12/28/2021)   [employer] of Occupational Health - Occupational Stress Questionnaire    Feeling of Stress : Not at all  Social Connections: Unknown (12/28/2021)   Social Connection and Isolation Panel [NHANES]    Frequency of Communication with Friends and Family: More than three times a week    Frequency of Social Gatherings with Friends and Family: Once a week    Attends Religious Services: More than 4 times per year    Active Member of [employer] or Organizations: Yes    Attends Engineer, structural: More than 4 times per year    Marital Status: Not on file     Review of Systems  Constitutional:  Negative for appetite change and unexpected weight change.  HENT:  Negative for congestion and sinus pressure.   Respiratory:  Negative for cough, chest tightness and shortness of breath.   Cardiovascular:  Negative for chest pain, palpitations and leg swelling.  Gastrointestinal:  Negative for abdominal pain, diarrhea, nausea and vomiting.  Genitourinary:  Negative for difficulty urinating and dysuria.  Musculoskeletal:  Negative for myalgias.       Knee pain as outlined.   Skin:  Negative for color change and rash.  Neurological:  Negative for dizziness and headaches.  Psychiatric/Behavioral:  Negative for agitation and dysphoric mood.        Objective:     BP 134/72   Pulse 60   Temp 97.9 F  (36.6 C)   Resp 16   Ht 5\' 3"  (1.6 m)   Wt 173 lb (78.5 kg)   SpO2  97%   BMI 30.65 kg/m  Wt Readings from Last 3 Encounters:  10/04/22 173 lb (78.5 kg)  08/28/22 183 lb 9.6 oz (83.3 kg)  07/19/22 176 lb (79.8 kg)    Physical Exam Vitals reviewed.  Constitutional:      General: She is not in acute distress.    Appearance: Normal appearance.  HENT:     Head: Normocephalic and atraumatic.     Right Ear: External ear normal.     Left Ear: External ear normal.  Eyes:     General: No scleral icterus.       Right eye: No discharge.        Left eye: No discharge.     Conjunctiva/sclera: Conjunctivae normal.  Neck:     Thyroid: No thyromegaly.  Cardiovascular:     Rate and Rhythm: Normal rate and regular rhythm.  Pulmonary:     Effort: No respiratory distress.     Breath sounds: Normal breath sounds. No wheezing.  Abdominal:     General: Bowel sounds are normal.     Palpations: Abdomen is soft.     Tenderness: There is no abdominal tenderness.  Musculoskeletal:        General: No swelling or tenderness.     Cervical back: Neck supple. No tenderness.  Lymphadenopathy:     Cervical: No cervical adenopathy.  Skin:    Findings: No erythema or rash.  Neurological:     Mental Status: She is alert.  Psychiatric:        Mood and Affect: Mood normal.        Behavior: Behavior normal.      Outpatient Encounter Medications as of 10/04/2022  Medication Sig   acetaminophen (TYLENOL) 650 MG CR tablet Take 650 mg by mouth every 8 (eight) hours as needed for pain.   amLODipine (NORVASC) 2.5 MG tablet Take 1 tablet (2.5 mg total) by mouth daily.   Calcium Carbonate-Vitamin D 600-400 MG-UNIT tablet Take 1 tablet by mouth daily.    cholecalciferol (VITAMIN D3) 25 MCG (1000 UNIT) tablet Take 1,000 Units by mouth daily.   Multiple Vitamins-Minerals (MULTIVITAMIN WITH MINERALS) tablet Take 1 tablet by mouth daily.   tamoxifen (NOLVADEX) 20 MG tablet Take 1 tablet (20 mg total) by mouth  daily.   [DISCONTINUED] amLODipine (NORVASC) 2.5 MG tablet TAKE 1 TABLET BY MOUTH EVERY DAY   [DISCONTINUED] furosemide (LASIX) 20 MG tablet Take by mouth. (Patient not taking: Reported on 08/28/2022)   Facility-Administered Encounter Medications as of 10/04/2022  Medication   sodium chloride 0.9 % 50 mL with famotidine (PEPCID) 20 mg infusion   sodium chloride flush (NS) 0.9 % injection 10 mL     Lab Results  Component Value Date   WBC 8.3 10/04/2022   HGB 12.7 10/04/2022   HCT 39.4 10/04/2022   PLT 264.0 10/04/2022   GLUCOSE 86 10/04/2022   CHOL 147 07/19/2022   TRIG 176.0 (H) 07/19/2022   HDL 56.60 07/19/2022   LDLCALC 55 07/19/2022   ALT 18 07/19/2022   AST 20 07/19/2022   NA 143 10/04/2022   K 3.9 10/04/2022   CL 107 10/04/2022   CREATININE 0.61 10/04/2022   BUN 12 10/04/2022   CO2 27 10/04/2022   TSH 2.47 05/07/2022   INR 1.0 05/21/2012   HGBA1C 5.7 10/04/2022    MM 3D SCREEN BREAST BILATERAL  Result Date: 02/21/2022 CLINICAL DATA:  Screening. EXAM: DIGITAL SCREENING BILATERAL MAMMOGRAM WITH TOMOSYNTHESIS AND CAD TECHNIQUE: Bilateral screening digital craniocaudal and  mediolateral oblique mammograms were obtained. Bilateral screening digital breast tomosynthesis was performed. The images were evaluated with computer-aided detection. COMPARISON:  Previous exam(s). ACR Breast Density Category c: The breast tissue is heterogeneously dense, which may obscure small masses. FINDINGS: There are no findings suspicious for malignancy. RIGHT breast surgical changes again noted. IMPRESSION: No mammographic evidence of malignancy. A result letter of this screening mammogram will be mailed directly to the patient. RECOMMENDATION: Screening mammogram in one year. (Code:SM-B-01Y) BI-RADS CATEGORY  2: Benign. Electronically Signed   By: Harmon Pier M.D.   On: 02/21/2022 12:57       Assessment & Plan:  Pre-op evaluation Assessment & Plan: Pre op - planning for TKA 5/16.  She denies  chest pain or sob.  No increased cough or congestion.  EKG - SR with no acute ischemic changes when compared to previous EKG.  Given above, I feel she is at low risk to proceed with planned surgery.  Will need close intra op and post op monitoring of blood pressure and pulse to avoid extremes.  Requested labs ordered today.    Orders: -     EKG 12-Lead -     CBC with Differential/Platelet -     Basic metabolic panel -     Hemoglobin A1c -     Urinalysis, Routine w reflex microscopic; Future  Anemia, unspecified type Assessment & Plan: Followed by oncology.  They follow cbc. Recheck today - prior to surgery.    Malignant neoplasm of female breast, unspecified estrogen receptor status, unspecified laterality, unspecified site of breast (HCC) Assessment & Plan: Stage I, ER/PR positive HER2 positive s/p lumpectomy and slnbx invasive mammary carcinoma of right breast.  Followed by oncology.  On tamoxifen.    Chemotherapy-induced neuropathy (HCC) Assessment & Plan: Documented - oncology.  Stable.    Hypercholesterolemia Assessment & Plan: Low cholesterol diet and exercise.  Follow lipid panel.    Hypertension, essential Assessment & Plan: On amlodipine 2.5mg  q day.  Blood pressure as outlined. Follow pressures.  Follow metabolic panel.    Osteoporosis without current pathological fracture, unspecified osteoporosis type Assessment & Plan: Receive prolia 08/02/22.     Other orders -     amLODIPine Besylate; Take 1 tablet (2.5 mg total) by mouth daily.  Dispense: 90 tablet; Refill: 1     Dale Lilburn, MD

## 2022-10-05 ENCOUNTER — Encounter: Payer: Self-pay | Admitting: Internal Medicine

## 2022-10-05 DIAGNOSIS — Z01818 Encounter for other preprocedural examination: Secondary | ICD-10-CM | POA: Insufficient documentation

## 2022-10-05 LAB — URINALYSIS, ROUTINE W REFLEX MICROSCOPIC
Bilirubin Urine: NEGATIVE
Hgb urine dipstick: NEGATIVE
Leukocytes,Ua: NEGATIVE
Nitrite: NEGATIVE
Specific Gravity, Urine: >=1.03 — AB (ref 1.000–1.030)
Total Protein, Urine: NEGATIVE
Urine Glucose: NEGATIVE
Urobilinogen, UA: 0.2 (ref 0.0–1.0)
pH: 5.5 (ref 5.0–8.0)

## 2022-10-05 NOTE — Assessment & Plan Note (Signed)
Low cholesterol diet and exercise.  Follow lipid panel.   

## 2022-10-05 NOTE — Assessment & Plan Note (Addendum)
Pre op - planning for TKA 5/16.  She denies chest pain or sob.  No increased cough or congestion.  EKG - SR with no acute ischemic changes when compared to previous EKG.  Given above, I feel she is at low risk to proceed with planned surgery.  Will need close intra op and post op monitoring of blood pressure and pulse to avoid extremes.  Requested labs ordered today.

## 2022-10-05 NOTE — Assessment & Plan Note (Signed)
Followed by oncology.  They follow cbc. Recheck today - prior to surgery.

## 2022-10-05 NOTE — Assessment & Plan Note (Signed)
On amlodipine 2.5mg q day.  Blood pressure as outlined.  Follow pressures.  Follow metabolic panel.  °

## 2022-10-05 NOTE — Assessment & Plan Note (Signed)
Stage I, ER/PR positive HER2 positive s/p lumpectomy and slnbx invasive mammary carcinoma of right breast.  Followed by oncology.  On tamoxifen.

## 2022-10-05 NOTE — Assessment & Plan Note (Signed)
Documented - oncology.  Stable.   

## 2022-10-05 NOTE — Assessment & Plan Note (Signed)
Receive prolia 08/02/22.

## 2022-10-10 HISTORY — PX: KNEE SURGERY: SHX244

## 2022-11-06 ENCOUNTER — Other Ambulatory Visit: Payer: Self-pay | Admitting: Orthopedic Surgery

## 2022-11-06 ENCOUNTER — Ambulatory Visit: Admission: RE | Admit: 2022-11-06 | Payer: Medicare HMO | Source: Ambulatory Visit

## 2022-11-06 DIAGNOSIS — R609 Edema, unspecified: Secondary | ICD-10-CM

## 2022-11-07 ENCOUNTER — Ambulatory Visit
Admission: RE | Admit: 2022-11-07 | Discharge: 2022-11-07 | Disposition: A | Discharge status: Home / Self Care | Payer: Medicare HMO | Source: Ambulatory Visit | Attending: Orthopedic Surgery | Admitting: Orthopedic Surgery

## 2022-11-07 DIAGNOSIS — R609 Edema, unspecified: Secondary | ICD-10-CM | POA: Diagnosis present

## 2022-11-13 ENCOUNTER — Telehealth: Payer: Self-pay | Admitting: Internal Medicine

## 2022-11-13 ENCOUNTER — Other Ambulatory Visit: Payer: Self-pay

## 2022-11-13 MED ORDER — AMLODIPINE BESYLATE 2.5 MG PO TABS: 2.5000 mg | ORAL_TABLET | Freq: Every day | ORAL | 1 refills | Status: DC

## 2022-11-13 NOTE — Telephone Encounter (Signed)
Prescription Request  11/13/2022  LOV: 10/04/2022  What is the name of the medication or equipment? amlodipine  Have you contacted your pharmacy to request a refill? Yes   Which pharmacy would you like this sent to?  [id number] - [place], [place] - [address] [place] [address] Phone: [phone] Fax: [phone]    Patient notified that their request is being sent to the clinical staff for review and that they should receive a response within 2 business days.   Please advise at [facility] [phone]

## 2022-11-13 NOTE — Telephone Encounter (Signed)
Amlodipine refilled.

## 2022-11-16 ENCOUNTER — Telehealth: Payer: Self-pay | Admitting: Internal Medicine

## 2022-11-16 NOTE — Telephone Encounter (Signed)
[name] [name] from [facility] home called stating pt is requesting a 3 in one comole

## 2022-11-19 ENCOUNTER — Ambulatory Visit: Payer: Medicare HMO | Admitting: Internal Medicine

## 2022-11-19 NOTE — Progress Notes (Unsigned)
Subjective:    Patient ID: [name] [name], female    DOB: March 14, 1934, 89 y.o.   MRN: [id number]  Patient here for No chief complaint on file.   HPI Here for follow up regarding history of breast cancer, osteoporosis, hypercholesterolemia and hypertension. Receiving prolia.  Last 08/02/22. On tamoxifen.  S/p TKA 10/25/22.    Past Medical History:  Diagnosis Date   Breast cancer (HCC)    Diverticulitis of sigmoid colon    Endometriosis    Family history of breast cancer    Family history of ovarian cancer    Family history of prostate cancer    Family history of uterine cancer    Fibrocystic breast disease    Glaucoma    H/O urinary incontinence    History of chicken pox    History of colon polyps 2014   Hyperlipidemia    Hypertension    Ovarian cancer (HCC) 2002   Chemo tx's @ [facility] with Total Hysterectomy. Dr Logan Bores   Personal history of chemotherapy    ovarian cancer   Personal history of ovarian cancer    Personal history of radiation therapy    Port-A-Cath in place    Sinusitis    Tuberculosis    positve skin test   Past Surgical History:  Procedure Laterality Date   ABDOMINAL HYSTERECTOMY  2003   complete Dr Logan Bores   ABSCESS DRAINAGE  2017   right buttock at [facility]   APPENDECTOMY  2003   BREAST EXCISIONAL BIOPSY Left 1993   excisional bx. negative results   BREAST LUMPECTOMY Right 03/18/2018   done at [facility] breast ca rad and chemo Invasive ductal carcinoma with focal squamous differentiation, Nottingham grade 3, is identified forming a 6 mm mass, associated with high-grade ductal carcinoma in situ, radial scar and biopsy site changes.   BREAST SURGERY Left 1982   papilloma removal   COLONOSCOPY  2006, 2014   Dr Mechele Collin    COLONOSCOPY N/A 03/27/2016   Procedure: COLONOSCOPY;  Surgeon: Earline Mayotte, MD;  Location: [facility] ENDOSCOPY;  Service: Endoscopy;  Laterality: N/A;   COLOSTOMY REVERSAL  2014   at [facility] Dr Joaquim Nam   NASAL SINUS SURGERY     For fungal  infection   NASAL SINUS SURGERY  8/05 8/06   OOPHORECTOMY     OSTOMY  05/22/2012   diverticulitis with abcess/ Dr Katrinka Blazing   ovarian cancer     Exploratory lap   PORTA CATH INSERTION N/A 05/26/2018   Procedure: PORTA CATH INSERTION;  Surgeon: Annice Needy, MD;  Location: [facility] INVASIVE CV LAB;  Service: Cardiovascular;  Laterality: N/A;   PORTA CATH REMOVAL N/A 11/12/2019   Procedure: PORTA CATH REMOVAL;  Surgeon: Annice Needy, MD;  Location: [facility] INVASIVE CV LAB;  Service: Cardiovascular;  Laterality: N/A;   TONSILLECTOMY AND ADENOIDECTOMY  1947   Family History  Problem Relation Age of Onset   Hypertension Mother    Coronary artery disease Mother    Heart disease Mother    Kidney disease Mother    Diabetes Mother    Cancer Mother        uterine cancer dx 5s   Diabetes Father    Kidney failure Father    Stroke Sister    Heart disease Sister        Pace Maker   Dementia Sister    Diabetes Brother    Coronary artery disease Brother    Cancer Brother  bladder cancer   Cancer Brother        Prostate   Cancer Maternal Aunt 30       ovarian cancer   Breast cancer Cousin    Social History   Socioeconomic History   Marital status: Widowed    Spouse name: Not on file   Number of children: 0   Years of education: Not on file   Highest education level: Not on file  Occupational History   Not on file  Tobacco Use   Smoking status: Never   Smokeless tobacco: Never  Vaping Use   Vaping Use: Never used  Substance and Sexual Activity   Alcohol use: No    Alcohol/week: 0.0 standard drinks of alcohol   Drug use: Yes   Sexual activity: Not Currently  Other Topics Concern   Not on file  Social History Narrative   Lives at home alone with dog   Social Determinants of Health   Financial Resource Strain: Low Risk  (12/28/2021)   Overall Financial Resource Strain (CARDIA)    Difficulty of Paying Living Expenses: Not hard at all  Food Insecurity: No Food Insecurity  (12/28/2021)   Hunger Vital Sign    Worried About Running Out of Food in the Last Year: Never true    Ran Out of Food in the Last Year: Never true  Transportation Needs: No Transportation Needs (12/28/2021)   PRAPARE - Administrator, Civil Service (Medical): No    Lack of Transportation (Non-Medical): No  Physical Activity: Not on file  Stress: No Stress Concern Present (12/28/2021)   [employer] of Occupational Health - Occupational Stress Questionnaire    Feeling of Stress : Not at all  Social Connections: Unknown (12/28/2021)   Social Connection and Isolation Panel [NHANES]    Frequency of Communication with Friends and Family: More than three times a week    Frequency of Social Gatherings with Friends and Family: Once a week    Attends Religious Services: More than 4 times per year    Active Member of [employer] or Organizations: Yes    Attends Engineer, structural: More than 4 times per year    Marital Status: Not on file     Review of Systems     Objective:     There were no vitals taken for this visit. Wt Readings from Last 3 Encounters:  10/04/22 173 lb (78.5 kg)  08/28/22 183 lb 9.6 oz (83.3 kg)  07/19/22 176 lb (79.8 kg)    Physical Exam   Outpatient Encounter Medications as of 11/19/2022  Medication Sig   acetaminophen (TYLENOL) 650 MG CR tablet Take 650 mg by mouth every 8 (eight) hours as needed for pain.   amLODipine (NORVASC) 2.5 MG tablet Take 1 tablet (2.5 mg total) by mouth daily.   Calcium Carbonate-Vitamin D 600-400 MG-UNIT tablet Take 1 tablet by mouth daily.    cholecalciferol (VITAMIN D3) 25 MCG (1000 UNIT) tablet Take 1,000 Units by mouth daily.   Multiple Vitamins-Minerals (MULTIVITAMIN WITH MINERALS) tablet Take 1 tablet by mouth daily.   tamoxifen (NOLVADEX) 20 MG tablet Take 1 tablet (20 mg total) by mouth daily.   [DISCONTINUED] amLODipine (NORVASC) 2.5 MG tablet TAKE 1 TABLET BY MOUTH EVERY DAY   [DISCONTINUED] furosemide  (LASIX) 20 MG tablet Take by mouth. (Patient not taking: Reported on 08/28/2022)   Facility-Administered Encounter Medications as of 11/19/2022  Medication   sodium chloride 0.9 % 50 mL with famotidine (PEPCID)  20 mg infusion   sodium chloride flush (NS) 0.9 % injection 10 mL     Lab Results  Component Value Date   WBC 8.3 10/04/2022   HGB 12.7 10/04/2022   HCT 39.4 10/04/2022   PLT 264.0 10/04/2022   GLUCOSE 86 10/04/2022   CHOL 147 07/19/2022   TRIG 176.0 (H) 07/19/2022   HDL 56.60 07/19/2022   LDLCALC 55 07/19/2022   ALT 18 07/19/2022   AST 20 07/19/2022   NA 143 10/04/2022   K 3.9 10/04/2022   CL 107 10/04/2022   CREATININE 0.61 10/04/2022   BUN 12 10/04/2022   CO2 27 10/04/2022   TSH 2.47 05/07/2022   INR 1.0 05/21/2012   HGBA1C 5.7 10/04/2022    US Venous Img Lower Unilateral Right (DVT)  Result Date: 11/07/2022 CLINICAL DATA:  89 year old female with history of right lower extremity edema. EXAM: RIGHT LOWER EXTREMITY VENOUS DOPPLER ULTRASOUND TECHNIQUE: Gray-scale sonography with graded compression, as well as color Doppler and duplex ultrasound were performed to evaluate the right lower extremity deep venous systems from the level of the common femoral vein and including the common femoral, femoral, profunda femoral, popliteal and calf veins including the posterior tibial, peroneal and gastrocnemius veins when visible. Spectral Doppler was utilized to evaluate flow at rest and with distal augmentation maneuvers in the common femoral, femoral and popliteal veins. The contralateral common femoral vein was also evaluated for comparison. COMPARISON:  07/22/2018 FINDINGS: RIGHT LOWER EXTREMITY Common Femoral Vein: No evidence of thrombus. Normal compressibility, respiratory phasicity and response to augmentation. Central Greater Saphenous Vein: No evidence of thrombus. Normal compressibility and flow on color Doppler imaging. Central Profunda Femoral Vein: No evidence of thrombus.  Normal compressibility and flow on color Doppler imaging. Femoral Vein: No evidence of thrombus. Normal compressibility, respiratory phasicity and response to augmentation. Popliteal Vein: No evidence of thrombus. Normal compressibility, respiratory phasicity and response to augmentation. Calf Veins: No evidence of thrombus. Normal compressibility and flow on color Doppler imaging. Venous Reflux:  None. Other Findings:  None. LEFT LOWER EXTREMITY Common Femoral Vein: No evidence of thrombus. Normal compressibility, respiratory phasicity and response to augmentation. IMPRESSION: No evidence of right lower extremity deep venous thrombosis. Marliss Coots, MD [facility]iology Electronically Signed   By: Marliss Coots M.D.   On: 11/07/2022 15:17       Assessment & Plan:  There are no diagnoses linked to this encounter.   Dale [place], MD

## 2022-11-21 ENCOUNTER — Encounter: Payer: Self-pay | Admitting: Internal Medicine

## 2022-11-21 NOTE — Progress Notes (Signed)
Patient ID: [name] [name], female   DOB: 1933-12-20, 89 y.o.   MRN: [id number] No showed for appt.

## 2022-11-21 NOTE — Telephone Encounter (Signed)
Called [name] [name]. Pt had surgery with ortho so [employer] is going to reach out to ortho for DME for 3 in 1 bedside commode. Will let us know if a problem.

## 2022-12-11 NOTE — Telephone Encounter (Signed)
Iona Coach called back to check the status of paperwork that where faxed to [place].

## 2022-12-11 NOTE — Telephone Encounter (Signed)
LM for Zacharia. When returns call please let them know I have not received any orders for her

## 2022-12-31 ENCOUNTER — Ambulatory Visit
Admission: RE | Admit: 2022-12-31 | Discharge: 2022-12-31 | Disposition: A | Discharge status: Home / Self Care | Payer: Medicare HMO | Source: Ambulatory Visit | Attending: Physician Assistant | Admitting: Physician Assistant

## 2022-12-31 ENCOUNTER — Other Ambulatory Visit: Payer: Self-pay | Admitting: Physician Assistant

## 2022-12-31 DIAGNOSIS — R2241 Localized swelling, mass and lump, right lower limb: Secondary | ICD-10-CM

## 2023-01-01 ENCOUNTER — Ambulatory Visit (INDEPENDENT_AMBULATORY_CARE_PROVIDER_SITE_OTHER): Payer: Medicare HMO | Admitting: *Deleted

## 2023-01-01 VITALS — Ht 63.0 in | Wt 168.0 lb

## 2023-01-01 DIAGNOSIS — Z0000 Encounter for general adult medical examination without abnormal findings: Secondary | ICD-10-CM

## 2023-01-01 NOTE — Progress Notes (Signed)
Subjective:   [name] [name] is a 89 y.o. female who presents for Medicare Annual (Subsequent) preventive examination.  Visit Complete: Virtual  I connected with  [name] [name] Camera on 01/01/23 by a audio enabled telemedicine application and verified that I am speaking with the correct person using two identifiers.  Patient Location: Home  Provider Location: Office/Clinic  I discussed the limitations of evaluation and management by telemedicine. The patient expressed understanding and agreed to proceed.  Review of Systems     Cardiac Risk Factors include: advanced age (>44men, >74 women);hypertension;Other (see comment), Risk factor comments: murmur cardiac     Objective:    Today's Vitals   01/01/23 0818  Weight: 168 lb (76.2 kg)  Height: 5\' 3"  (1.6 m)   Body mass index is 29.76 kg/m.     01/01/2023    8:43 AM 08/28/2022   11:44 AM 02/23/2022   10:44 AM 12/28/2021    9:09 AM 08/23/2021   10:00 AM 02/22/2021   10:13 AM 11/23/2020    1:27 PM  Advanced Directives  Does Patient Have a Medical Advance Directive? No No No No No No No  Does patient want to make changes to medical advance directive?  No - Patient declined No - Patient declined   No - Patient declined   Would patient like information on creating a medical advance directive?  No - Patient declined No - Patient declined No - Patient declined  No - Patient declined No - Patient declined    Current Medications (verified) Outpatient Encounter Medications as of 01/01/2023  Medication Sig   acetaminophen (TYLENOL) 650 MG CR tablet Take 650 mg by mouth every 8 (eight) hours as needed for pain.   amLODipine (NORVASC) 2.5 MG tablet Take 1 tablet (2.5 mg total) by mouth daily.   aspirin 81 MG chewable tablet Chew by mouth in the morning and at bedtime.   Calcium Carbonate-Vitamin D 600-400 MG-UNIT tablet Take 1 tablet by mouth daily.    cholecalciferol (VITAMIN D3) 25 MCG (1000 UNIT) tablet Take 1,000 Units by mouth daily.    meloxicam (MOBIC) 15 MG tablet Take 15 mg by mouth daily.   Multiple Vitamins-Minerals (MULTIVITAMIN WITH MINERALS) tablet Take 1 tablet by mouth daily.   tamoxifen (NOLVADEX) 20 MG tablet Take 1 tablet (20 mg total) by mouth daily.   Facility-Administered Encounter Medications as of 01/01/2023  Medication   sodium chloride 0.9 % 50 mL with famotidine (PEPCID) 20 mg infusion   sodium chloride flush (NS) 0.9 % injection 10 mL    Allergies (verified) Patient has no known allergies.   History: Past Medical History:  Diagnosis Date   Breast cancer (HCC)    Diverticulitis of sigmoid colon    Endometriosis    Family history of breast cancer    Family history of ovarian cancer    Family history of prostate cancer    Family history of uterine cancer    Fibrocystic breast disease    Glaucoma    H/O urinary incontinence    History of chicken pox    History of colon polyps 2014   Hyperlipidemia    Hypertension    Ovarian cancer (HCC) 2002   Chemo tx's @ [facility] with Total Hysterectomy. Dr Logan Bores   Personal history of chemotherapy    ovarian cancer   Personal history of ovarian cancer    Personal history of radiation therapy    Port-A-Cath in place    Sinusitis    Tuberculosis  positve skin test   Past Surgical History:  Procedure Laterality Date   ABDOMINAL HYSTERECTOMY  2003   complete Dr Logan Bores   ABSCESS DRAINAGE  2017   right buttock at [facility]   APPENDECTOMY  2003   BREAST EXCISIONAL BIOPSY Left 1993   excisional bx. negative results   BREAST LUMPECTOMY Right 03/18/2018   done at [facility] breast ca rad and chemo Invasive ductal carcinoma with focal squamous differentiation, Nottingham grade 3, is identified forming a 6 mm mass, associated with high-grade ductal carcinoma in situ, radial scar and biopsy site changes.   BREAST SURGERY Left 1982   papilloma removal   COLONOSCOPY  2006, 2014   Dr Mechele Collin    COLONOSCOPY N/A 03/27/2016   Procedure: COLONOSCOPY;  Surgeon: Earline Mayotte, MD;  Location: [facility] ENDOSCOPY;  Service: Endoscopy;  Laterality: N/A;   COLOSTOMY REVERSAL  2014   at [facility] Dr Joaquim Nam   KNEE SURGERY Right 10/2022   knee replacement   NASAL SINUS SURGERY     For fungal infection   NASAL SINUS SURGERY  8/05 8/06   OOPHORECTOMY     OSTOMY  05/22/2012   diverticulitis with abcess/ Dr Katrinka Blazing   ovarian cancer     Exploratory lap   PORTA CATH INSERTION N/A 05/26/2018   Procedure: PORTA CATH INSERTION;  Surgeon: Annice Needy, MD;  Location: [facility] INVASIVE CV LAB;  Service: Cardiovascular;  Laterality: N/A;   PORTA CATH REMOVAL N/A 11/12/2019   Procedure: PORTA CATH REMOVAL;  Surgeon: Annice Needy, MD;  Location: [facility] INVASIVE CV LAB;  Service: Cardiovascular;  Laterality: N/A;   TONSILLECTOMY AND ADENOIDECTOMY  1947   Family History  Problem Relation Age of Onset   Hypertension Mother    Coronary artery disease Mother    Heart disease Mother    Kidney disease Mother    Diabetes Mother    Cancer Mother        uterine cancer dx 65s   Diabetes Father    Kidney failure Father    Stroke Sister    Heart disease Sister        Visual merchandiser   Dementia Sister    Diabetes Brother    Coronary artery disease Brother    Cancer Brother        bladder cancer   Cancer Brother        Prostate   Cancer Maternal Aunt 49       ovarian cancer   Breast cancer Cousin    Social History   Socioeconomic History   Marital status: Widowed    Spouse name: Not on file   Number of children: 0   Years of education: Not on file   Highest education level: Not on file  Occupational History   Not on file  Tobacco Use   Smoking status: Never   Smokeless tobacco: Never  Vaping Use   Vaping status: Never Used  Substance and Sexual Activity   Alcohol use: No    Alcohol/week: 0.0 standard drinks of alcohol   Drug use: Yes   Sexual activity: Not Currently  Other Topics Concern   Not on file  Social History Narrative   Lives at home alone with dog    Social Determinants of Health   Financial Resource Strain: Low Risk  (01/01/2023)   Overall Financial Resource Strain (CARDIA)    Difficulty of Paying Living Expenses: Not hard at all  Food Insecurity: No Food Insecurity (01/01/2023)   Hunger  Vital Sign    Worried About Programme researcher, broadcasting/film/video in the Last Year: Never true    Ran Out of Food in the Last Year: Never true  Transportation Needs: No Transportation Needs (01/01/2023)   PRAPARE - Administrator, Civil Service (Medical): No    Lack of Transportation (Non-Medical): No  Physical Activity: Inactive (01/01/2023)   Exercise Vital Sign    Days of Exercise per Week: 0 days    Minutes of Exercise per Session: 0 min  Stress: No Stress Concern Present (01/01/2023)   [employer] of Occupational Health - Occupational Stress Questionnaire    Feeling of Stress : Not at all  Social Connections: Moderately Integrated (01/01/2023)   Social Connection and Isolation Panel [NHANES]    Frequency of Communication with Friends and Family: More than three times a week    Frequency of Social Gatherings with Friends and Family: More than three times a week    Attends Religious Services: More than 4 times per year    Active Member of [employer] or Organizations: Yes    Attends Banker Meetings: More than 4 times per year    Marital Status: Widowed    Tobacco Counseling Counseling given: Not Answered   Clinical Intake:  Pre-visit preparation completed: Yes  Pain : No/denies pain     BMI - recorded: 29.76 Nutritional Risks: None Diabetes: No  How often do you need to have someone help you when you read instructions, pamphlets, or other written materials from your doctor or pharmacy?: 1 - Never  Interpreter Needed?: No  Information entered by :: R. Athene Schuhmacher LPN   Activities of Daily Living    01/01/2023    8:22 AM  In your present state of health, do you have any difficulty performing the following activities:   Hearing? 0  Vision? 0  Comment glasses  Difficulty concentrating or making decisions? 0  Walking or climbing stairs? 1  Comment just had knee replacement  Dressing or bathing? 0  Doing errands, shopping? 1  Comment only because of the knee replacement  Preparing Food and eating ? N  Using the Toilet? N  In the past six months, have you accidently leaked urine? Y  Comment wears pads  Do you have problems with loss of bowel control? N  Managing your Medications? N  Managing your Finances? N  Housekeeping or managing your Housekeeping? N    Patient Care Team: Dale Michie, MD as PCP - General (Internal Medicine) Glori Luis, MD as Consulting Physician (Family Medicine) Kieth Brightly, MD (General Surgery) Dale [place], MD (Internal Medicine) Lemar Livings, Merrily Pew, MD (General Surgery) Carmina Miller, MD as Referring Physician (Radiation Oncology) Gilda Crease, Latina Craver, MD as Consulting Physician (Vascular Surgery) Creig Hines, MD as Consulting Physician (Hematology and Oncology)  Indicate any recent Medical Services you may have received from other than [facility] providers in the past year (date may be approximate).     Assessment:   This is a routine wellness examination for [name] [name].  Hearing/Vision screen Hearing Screening - Comments:: No issues Vision Screening - Comments:: glasses  Dietary issues and exercise activities discussed:     Goals Addressed             This Visit's Progress    Patient Stated       Would like to lose some weight       Depression Screen    01/01/2023    8:36 AM 07/19/2022  9:43 AM 05/07/2022    7:15 AM 01/16/2022   10:55 AM 12/28/2021    9:04 AM 11/23/2020    1:26 PM 11/23/2019   11:52 AM  PHQ 2/9 Scores  PHQ - 2 Score 0 0 1 0 0 0 0  PHQ- 9 Score 0          Fall Risk    01/01/2023    8:26 AM 07/19/2022    9:43 AM 05/07/2022    7:15 AM 01/16/2022   10:55 AM 12/28/2021    9:12 AM  Fall Risk   Falls in the past  year? 1 0 0 0 0  Number falls in past yr: 1 0 0 0   Comment trip on rug after surgery, no injury      Injury with Fall? 0 0 0 0   Risk for fall due to : History of fall(s);Impaired mobility No Fall Risks History of fall(s);Impaired mobility No Fall Risks   Follow up Falls prevention discussed;Falls evaluation completed Falls evaluation completed Falls evaluation completed Falls evaluation completed Falls evaluation completed    MEDICARE RISK AT HOME:  Medicare Risk at Home - 01/01/23 0828     Any stairs in or around the home? Yes    If so, are there any without handrails? Yes   garage   Home free of loose throw rugs in walkways, pet beds, electrical cords, etc? Yes    Adequate lighting in your home to reduce risk of falls? Yes    Life alert? Yes    Use of a cane, walker or w/c? Yes   walker   Grab bars in the bathroom? Yes    Shower chair or bench in shower? Yes    Elevated toilet seat or a handicapped toilet? Yes              Cognitive Function:    08/29/2017   11:39 AM 02/08/2016    9:05 AM  MMSE - Mini Mental State Exam  Orientation to time 5 5  Orientation to Place 5 5  Registration 3 3  Attention/ Calculation 5 5  Recall 1 3  Language- name 2 objects 2 2  Language- repeat 1 1  Language- follow 3 step command 3 3  Language- read & follow direction 1 1  Write a sentence 1 1  Copy design 1 1  Total score 28 30        01/01/2023    8:44 AM 11/23/2019   11:58 AM 11/19/2018    1:51 PM  6CIT Screen  What Year? 0 points 0 points 0 points  What month? 0 points 0 points 0 points  What time? 0 points  0 points  Count back from 20 0 points  0 points  Months in reverse 0 points 0 points 0 points  Repeat phrase 0 points  0 points  Total Score 0 points  0 points    Immunizations Immunization History  Administered Date(s) Administered   PFIZER(Purple Top)SARS-COV-2 Vaccination 07/07/2019, 07/28/2019, 04/15/2020, 11/01/2020    TDAP status: Due, Education has been  provided regarding the importance of this vaccine. Advised may receive this vaccine at local pharmacy or Health Dept. Aware to provide a copy of the vaccination record if obtained from local pharmacy or Health Dept. Verbalized acceptance and understanding.  Flu Vaccine status: Declined, Education has been provided regarding the importance of this vaccine but patient still declined. Advised may receive this vaccine at local pharmacy or Health Dept. Aware to provide  a copy of the vaccination record if obtained from local pharmacy or Health Dept. Verbalized acceptance and understanding.  Pneumococcal vaccine status: Declined,  Education has been provided regarding the importance of this vaccine but patient still declined. Advised may receive this vaccine at local pharmacy or Health Dept. Aware to provide a copy of the vaccination record if obtained from local pharmacy or Health Dept. Verbalized acceptance and understanding.   Covid-19 vaccine status: Completed vaccines  Qualifies for Shingles Vaccine? Yes   Zostavax completed No   Shingrix Completed?: No.    Education has been provided regarding the importance of this vaccine. Patient has been advised to call insurance company to determine out of pocket expense if they have not yet received this vaccine. Advised may also receive vaccine at local pharmacy or Health Dept. Verbalized acceptance and understanding.  Screening Tests Health Maintenance  Topic Date Due   DTaP/Tdap/Td (1 - Tdap) Never done   COVID-19 Vaccine (5 - 2023-24 season) 02/09/2022   Medicare Annual Wellness (AWV)  12/29/2022   Zoster Vaccines- Shingrix (1 of 2) 01/03/2023 (Originally 03/18/1953)   Pneumonia Vaccine 100+ Years old (1 of 1 - PCV) 07/20/2023 (Originally 03/17/2001)   INFLUENZA VACCINE  01/10/2023   MAMMOGRAM  02/21/2023   DEXA SCAN  Completed   HPV VACCINES  Aged Out    Health Maintenance  Health Maintenance Due  Topic Date Due   DTaP/Tdap/Td (1 - Tdap) Never  done   COVID-19 Vaccine (5 - 2023-24 season) 02/09/2022   Medicare Annual Wellness (AWV)  12/29/2022    Colorectal cancer screening: No longer required.   Mammogram status: Completed 9/23. Repeat every year  Bone Density status: Completed 10/22. Results reflect: Bone density results: OSTEOPOROSIS. Repeat every 2 years.  Lung Cancer Screening: (Low Dose CT Chest recommended if Age 60-80 years, 20 pack-year currently smoking OR have quit w/in 15years.) does not qualify.     Additional Screening:  Hepatitis C Screening: does not qualify; Completed NA age  Vision Screening: Recommended annual ophthalmology exams for early detection of glaucoma and other disorders of the eye. Is the patient up to date with their annual eye exam?  Yes  Who is the provider or what is the name of the office in which the patient attends annual eye exams? [facility] If pt is not established with a provider, would they like to be referred to a provider to establish care? No .   Dental Screening: Recommended annual dental exams for proper oral hygiene  Community Resource Referral / Chronic Care Management: CRR required this visit?  No   CCM required this visit?  No     Plan:     I have personally reviewed and noted the following in the patient's chart:   Medical and social history Use of alcohol, tobacco or illicit drugs  Current medications and supplements including opioid prescriptions. Patient is not currently taking opioid prescriptions. Functional ability and status Nutritional status Physical activity Advanced directives List of other physicians Hospitalizations, surgeries, and ER visits in previous 12 months Vitals Screenings to include cognitive, depression, and falls Referrals and appointments  In addition, I have reviewed and discussed with patient certain preventive protocols, quality metrics, and best practice recommendations. A written personalized care plan for preventive  services as well as general preventive health recommendations were provided to patient.     Sydell Axon, LPN   [id number]   After Visit Summary: (MyChart) Due to this being a telephonic visit, the after visit summary  with patients personalized plan was offered to patient via MyChart   Nurse Notes: None

## 2023-01-01 NOTE — Patient Instructions (Signed)
Per patient no change in vitals since last visit; unable to obtain new vitals due to this being a telehealth visit. No changes per patient since last visit of 11/19/22 [name] [name] , Thank you for taking time to come for your Medicare Wellness Visit. I appreciate your ongoing commitment to your health goals. Please review the following plan we discussed and let me know if I can assist you in the future.   These are the goals we discussed:  Goals       Follow up with Primary Care Provider (pt-stated)      As needed. Arthritic exercises like stretching more often. Lose a little weight. Portion control.        Patient Stated      Would like to lose some weight        This is a list of the screening recommended for you and due dates:  Health Maintenance  Topic Date Due   DTaP/Tdap/Td vaccine (1 - Tdap) Never done   COVID-19 Vaccine (5 - 2023-24 season) 02/09/2022   Zoster (Shingles) Vaccine (1 of 2) 01/03/2023*   Pneumonia Vaccine (1 of 1 - PCV) 07/20/2023*   Flu Shot  01/10/2023   Mammogram  02/21/2023   Medicare Annual Wellness Visit  01/01/2024   DEXA scan (bone density measurement)  Completed   HPV Vaccine  Aged Out  *Topic was postponed. The date shown is not the original due date.    Advanced directives: Will work on this  Conditions/risks identified: None  Next appointment: Follow up in one year for your annual wellness visit 01/03/24 @ 8:15   Preventive Care 65 Years and Older, Female Preventive care refers to lifestyle choices and visits with your health care provider that can promote health and wellness. What does preventive care include? A yearly physical exam. This is also called an annual well check. Dental exams once or twice a year. Routine eye exams. Ask your health care provider how often you should have your eyes checked. Personal lifestyle choices, including: Daily care of your teeth and gums. Regular physical activity. Eating a healthy diet. Avoiding  tobacco and drug use. Limiting alcohol use. Practicing safe sex. Taking low-dose aspirin every day. Taking vitamin and mineral supplements as recommended by your health care provider. What happens during an annual well check? The services and screenings done by your health care provider during your annual well check will depend on your age, overall health, lifestyle risk factors, and family history of disease. Counseling  Your health care provider may ask you questions about your: Alcohol use. Tobacco use. Drug use. Emotional well-being. Home and relationship well-being. Sexual activity. Eating habits. History of falls. Memory and ability to understand (cognition). Work and work Astronomer. Reproductive health. Screening  You may have the following tests or measurements: Height, weight, and BMI. Blood pressure. Lipid and cholesterol levels. These may be checked every 5 years, or more frequently if you are over 31 years old. Skin check. Lung cancer screening. You may have this screening every year starting at age 67 if you have a 30-pack-year history of smoking and currently smoke or have quit within the past 15 years. Fecal occult blood test (FOBT) of the stool. You may have this test every year starting at age 60. Flexible sigmoidoscopy or colonoscopy. You may have a sigmoidoscopy every 5 years or a colonoscopy every 10 years starting at age 83. Hepatitis C blood test. Hepatitis B blood test. Sexually transmitted disease (STD) testing. Diabetes screening. This  is done by checking your blood sugar (glucose) after you have not eaten for a while (fasting). You may have this done every 1-3 years. Bone density scan. This is done to screen for osteoporosis. You may have this done starting at age 48. Mammogram. This may be done every 1-2 years. Talk to your health care provider about how often you should have regular mammograms. Talk with your health care provider about your test  results, treatment options, and if necessary, the need for more tests. Vaccines  Your health care provider may recommend certain vaccines, such as: Influenza vaccine. This is recommended every year. Tetanus, diphtheria, and acellular pertussis (Tdap, Td) vaccine. You may need a Td booster every 10 years. Zoster vaccine. You may need this after age 63. Pneumococcal 13-valent conjugate (PCV13) vaccine. One dose is recommended after age 11. Pneumococcal polysaccharide (PPSV23) vaccine. One dose is recommended after age 56. Talk to your health care provider about which screenings and vaccines you need and how often you need them. This information is not intended to replace advice given to you by your health care provider. Make sure you discuss any questions you have with your health care provider. Document Released: 06/24/2015 Document Revised: 02/15/2016 Document Reviewed: 03/29/2015 Elsevier Interactive Patient Education  2017 [employer].  Fall Prevention in the Home Falls can cause injuries. They can happen to people of all ages. There are many things you can do to make your home safe and to help prevent falls. What can I do on the outside of my home? Regularly fix the edges of walkways and driveways and fix any cracks. Remove anything that might make you trip as you walk through a door, such as a raised step or threshold. Trim any bushes or trees on the path to your home. Use bright outdoor lighting. Clear any walking paths of anything that might make someone trip, such as rocks or tools. Regularly check to see if handrails are loose or broken. Make sure that both sides of any steps have handrails. Any raised decks and porches should have guardrails on the edges. Have any leaves, snow, or ice cleared regularly. Use sand or salt on walking paths during winter. Clean up any spills in your garage right away. This includes oil or grease spills. What can I do in the bathroom? Use night  lights. Install grab bars by the toilet and in the tub and shower. Do not use towel bars as grab bars. Use non-skid mats or decals in the tub or shower. If you need to sit down in the shower, use a plastic, non-slip stool. Keep the floor dry. Clean up any water that spills on the floor as soon as it happens. Remove soap buildup in the tub or shower regularly. Attach bath mats securely with double-sided non-slip rug tape. Do not have throw rugs and other things on the floor that can make you trip. What can I do in the bedroom? Use night lights. Make sure that you have a light by your bed that is easy to reach. Do not use any sheets or blankets that are too big for your bed. They should not hang down onto the floor. Have a firm chair that has side arms. You can use this for support while you get dressed. Do not have throw rugs and other things on the floor that can make you trip. What can I do in the kitchen? Clean up any spills right away. Avoid walking on wet floors. Keep items that  you use a lot in easy-to-reach places. If you need to reach something above you, use a strong step stool that has a grab bar. Keep electrical cords out of the way. Do not use floor polish or wax that makes floors slippery. If you must use wax, use non-skid floor wax. Do not have throw rugs and other things on the floor that can make you trip. What can I do with my stairs? Do not leave any items on the stairs. Make sure that there are handrails on both sides of the stairs and use them. Fix handrails that are broken or loose. Make sure that handrails are as long as the stairways. Check any carpeting to make sure that it is firmly attached to the stairs. Fix any carpet that is loose or worn. Avoid having throw rugs at the top or bottom of the stairs. If you do have throw rugs, attach them to the floor with carpet tape. Make sure that you have a light switch at the top of the stairs and the bottom of the stairs. If  you do not have them, ask someone to add them for you. What else can I do to help prevent falls? Wear shoes that: Do not have high heels. Have rubber bottoms. Are comfortable and fit you well. Are closed at the toe. Do not wear sandals. If you use a stepladder: Make sure that it is fully opened. Do not climb a closed stepladder. Make sure that both sides of the stepladder are locked into place. Ask someone to hold it for you, if possible. Clearly mark and make sure that you can see: Any grab bars or handrails. First and last steps. Where the edge of each step is. Use tools that help you move around (mobility aids) if they are needed. These include: Canes. Walkers. Scooters. Crutches. Turn on the lights when you go into a dark area. Replace any light bulbs as soon as they burn out. Set up your furniture so you have a clear path. Avoid moving your furniture around. If any of your floors are uneven, fix them. If there are any pets around you, be aware of where they are. Review your medicines with your doctor. Some medicines can make you feel dizzy. This can increase your chance of falling. Ask your doctor what other things that you can do to help prevent falls. This information is not intended to replace advice given to you by your health care provider. Make sure you discuss any questions you have with your health care provider. Document Released: 03/24/2009 Document Revised: 11/03/2015 Document Reviewed: 07/02/2014 Elsevier Interactive Patient Education  2017 [employer].

## 2023-01-07 ENCOUNTER — Encounter: Payer: Self-pay | Admitting: *Deleted

## 2023-01-07 ENCOUNTER — Telehealth: Payer: Self-pay | Admitting: *Deleted

## 2023-01-07 NOTE — Telephone Encounter (Signed)
$  10 due for Prolia Pt due on or after 01/31/23 New Prior Auth required-(sent to PA team)

## 2023-01-09 ENCOUNTER — Encounter (INDEPENDENT_AMBULATORY_CARE_PROVIDER_SITE_OTHER): Payer: Self-pay

## 2023-01-09 NOTE — Telephone Encounter (Signed)
Pharmacy Patient Advocate Encounter   PA for Prolia submitted to [facility] via [employer]. Status pending.  Authorization Number : [id number]

## 2023-01-09 NOTE — Telephone Encounter (Signed)
PA has been APPROVED from 01/09/2023-01/09/2024  Approval letter has been attached in patients media

## 2023-01-10 NOTE — Telephone Encounter (Signed)
Prolia approved through [employer]  Authorization Number : [id number]   Valid: 01/09/23-01/09/24

## 2023-01-18 NOTE — Telephone Encounter (Signed)
Notes, thanks~!

## 2023-01-31 ENCOUNTER — Ambulatory Visit: Payer: Medicare HMO

## 2023-01-31 DIAGNOSIS — M810 Age-related osteoporosis without current pathological fracture: Secondary | ICD-10-CM

## 2023-01-31 MED ORDER — DENOSUMAB 60 MG/ML ~~LOC~~ SOSY
60.0000 mg | PREFILLED_SYRINGE | Freq: Once | SUBCUTANEOUS | Status: AC
Start: 2023-01-31 — End: 2023-01-31
  Administered 2023-01-31: 60 mg via SUBCUTANEOUS

## 2023-01-31 NOTE — Progress Notes (Signed)
 Patient arrived for a Prolia injection and it was administered into her left arm SQ. Patient tolerated the injection well and did not show any signs of distress or voice any concerns.

## 2023-02-25 ENCOUNTER — Ambulatory Visit
Admission: RE | Admit: 2023-02-25 | Discharge: 2023-02-25 | Disposition: A | Discharge status: Home / Self Care | Payer: Medicare HMO | Source: Ambulatory Visit | Attending: Nurse Practitioner | Admitting: Nurse Practitioner

## 2023-02-25 DIAGNOSIS — Z08 Encounter for follow-up examination after completed treatment for malignant neoplasm: Secondary | ICD-10-CM

## 2023-02-25 DIAGNOSIS — Z1231 Encounter for screening mammogram for malignant neoplasm of breast: Secondary | ICD-10-CM | POA: Insufficient documentation

## 2023-02-25 DIAGNOSIS — Z853 Personal history of malignant neoplasm of breast: Secondary | ICD-10-CM | POA: Insufficient documentation

## 2023-03-01 ENCOUNTER — Ambulatory Visit: Payer: Medicare HMO | Admitting: Oncology

## 2023-03-07 ENCOUNTER — Telehealth: Payer: Self-pay | Admitting: Internal Medicine

## 2023-03-07 DIAGNOSIS — M25562 Pain in left knee: Secondary | ICD-10-CM

## 2023-03-07 NOTE — Telephone Encounter (Signed)
Pt had right knee replaced by Dr. Odis Luster but does not wish to continue with him.  Would like to see Dr. Ernest Pine for an evaluation of the left knee.  Pt is aware that Dr. Lorin Picket is out of the office until next week.

## 2023-03-07 NOTE — Telephone Encounter (Signed)
Patient would like a referral to see Dr Ernest Pine for her knee.

## 2023-03-08 NOTE — Telephone Encounter (Signed)
Order placed for ortho referral.  Someone should be contacting her with an appt date and time.  Let us know if does not hear about appt.

## 2023-03-08 NOTE — Addendum Note (Signed)
Addended by: Charm Barges on: 03/08/2023 05:38 AM   Modules accepted: Orders

## 2023-04-08 ENCOUNTER — Ambulatory Visit
Admission: RE | Admit: 2023-04-08 | Discharge: 2023-04-08 | Disposition: A | Discharge status: Home / Self Care | Payer: Medicare HMO | Source: Ambulatory Visit | Attending: Nurse Practitioner | Admitting: Nurse Practitioner

## 2023-04-08 DIAGNOSIS — Z79810 Long term (current) use of selective estrogen receptor modulators (SERMs): Secondary | ICD-10-CM | POA: Insufficient documentation

## 2023-04-08 DIAGNOSIS — M810 Age-related osteoporosis without current pathological fracture: Secondary | ICD-10-CM | POA: Insufficient documentation

## 2023-04-08 DIAGNOSIS — Z5181 Encounter for therapeutic drug level monitoring: Secondary | ICD-10-CM | POA: Diagnosis present

## 2023-04-12 ENCOUNTER — Encounter: Payer: Self-pay | Admitting: Oncology

## 2023-04-12 ENCOUNTER — Inpatient Hospital Stay: Payer: Medicare HMO | Attending: Oncology | Admitting: Oncology

## 2023-04-12 VITALS — BP 126/57 | HR 74 | Temp 96.6°F | Resp 17 | Wt 166.8 lb

## 2023-04-12 DIAGNOSIS — Z79810 Long term (current) use of selective estrogen receptor modulators (SERMs): Secondary | ICD-10-CM | POA: Diagnosis not present

## 2023-04-12 DIAGNOSIS — Z170 Estrogen receptor positive status [ER+]: Secondary | ICD-10-CM | POA: Diagnosis not present

## 2023-04-12 DIAGNOSIS — Z7983 Long term (current) use of bisphosphonates: Secondary | ICD-10-CM

## 2023-04-12 DIAGNOSIS — M810 Age-related osteoporosis without current pathological fracture: Secondary | ICD-10-CM

## 2023-04-12 DIAGNOSIS — Z923 Personal history of irradiation: Secondary | ICD-10-CM | POA: Insufficient documentation

## 2023-04-12 DIAGNOSIS — Z853 Personal history of malignant neoplasm of breast: Secondary | ICD-10-CM

## 2023-04-12 DIAGNOSIS — C50911 Malignant neoplasm of unspecified site of right female breast: Secondary | ICD-10-CM | POA: Diagnosis present

## 2023-04-12 DIAGNOSIS — Z9221 Personal history of antineoplastic chemotherapy: Secondary | ICD-10-CM | POA: Diagnosis not present

## 2023-04-12 DIAGNOSIS — Z08 Encounter for follow-up examination after completed treatment for malignant neoplasm: Secondary | ICD-10-CM | POA: Diagnosis not present

## 2023-04-12 DIAGNOSIS — Z5181 Encounter for therapeutic drug level monitoring: Secondary | ICD-10-CM

## 2023-04-12 NOTE — Progress Notes (Signed)
Hematology/Oncology Consult note [facility]  [phone] [phone]  Patient Care Team: Dale Harding, MD as PCP - General (Internal Medicine) Birdie Sons Yehuda Mao, MD as Consulting Physician (Family Medicine) Kieth Brightly, MD (General Surgery) Dale Cooper[place], MD (Internal Medicine) Lemar Livings, Merrily Pew, MD (General Surgery) Carmina Miller, MD as Referring Physician (Radiation Oncology) Gilda Crease, Latina Craver, MD as Consulting Physician (Vascular Surgery) Creig Hines, MD as Consulting Physician (Hematology and Oncology)   Name of the patient: [name] [name]  [id number]  1933/07/25   Date of visit: 04/12/23  Diagnosis- stage I right breast cancer in 2019   Chief complaint/ Reason for visit-routine follow-up of breast cancer  Heme/Onc history: Patient is a 89 year old female who was diagnosed with stage I HER2 positiveRight breast cancer in 2019 s/p lumpectomy and sentinel lymph node biopsy.  Final pathology showed 0.6 cm grade 3 invasive mammary carcinoma ER 99% positive PR 25% positive and HER2 positive by FISH.  She received 12 weeks of Herceptin and Taxol followed by weekly Herceptin for 1 year.  She also received adjuvant radiation treatment she is currently on tamoxifen for hormone therapy which she started sometime in February 2020   Also has a history of stage IIb ovarian cancer s/p exploratory laparotomy TAH/BSO omentectomy peritoneal biopsies and pelvic and periaortic lymph node sampling back in 2003.  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  She has also received adjuvant CarboTaxol for it back then.  Has baseline osteoporosis on bone density.    Interval history-tolerating tamoxifen well without any significant side effects.  She is also receiving Xgeva every 6 months through Dr. Lorin Picket.  Denies any changes in her appetite or weight.  Denies any breast concerns  ECOG PS- 1 Pain scale-  0   Review of systems- Review of Systems  Constitutional:  Positive for malaise/fatigue. Negative for chills, fever and weight loss.  HENT:  Negative for congestion, ear discharge and nosebleeds.   Eyes:  Negative for blurred vision.  Respiratory:  Negative for cough, hemoptysis, sputum production, shortness of breath and wheezing.   Cardiovascular:  Negative for chest pain, palpitations, orthopnea and claudication.  Gastrointestinal:  Negative for abdominal pain, blood in stool, constipation, diarrhea, heartburn, melena, nausea and vomiting.  Genitourinary:  Negative for dysuria, flank pain, frequency, hematuria and urgency.  Musculoskeletal:  Negative for back pain, joint pain and myalgias.  Skin:  Negative for rash.  Neurological:  Negative for dizziness, tingling, focal weakness, seizures, weakness and headaches.  Endo/Heme/Allergies:  Does not bruise/bleed easily.  Psychiatric/Behavioral:  Negative for depression and suicidal ideas. The patient does not have insomnia.       No Known Allergies   Past Medical History:  Diagnosis Date   Breast cancer (HCC)    Diverticulitis of sigmoid colon    Endometriosis    Family history of breast cancer    Family history of ovarian cancer    Family history of prostate cancer    Family history of uterine cancer    Fibrocystic breast disease    Glaucoma    H/O urinary incontinence    History of chicken pox    History of colon polyps 2014   Hyperlipidemia    Hypertension    Ovarian cancer (HCC) 2002   Chemo tx's @ [facility] with Total Hysterectomy. Dr Logan Bores   Personal history of chemotherapy    ovarian cancer   Personal history of ovarian cancer    Personal history of  radiation therapy    Port-A-Cath in place    Sinusitis    Tuberculosis    positve skin test     Past Surgical History:  Procedure Laterality Date   ABDOMINAL HYSTERECTOMY  2003   complete Dr Logan Bores   ABSCESS DRAINAGE  2017   right buttock at [facility]   APPENDECTOMY   2003   BREAST EXCISIONAL BIOPSY Left 1993   excisional bx. negative results   BREAST LUMPECTOMY Right 03/18/2018   done at [facility] breast ca rad and chemo Invasive ductal carcinoma with focal squamous differentiation, Nottingham grade 3, is identified forming a 6 mm mass, associated with high-grade ductal carcinoma in situ, radial scar and biopsy site changes.   BREAST SURGERY Left 1982   papilloma removal   COLONOSCOPY  2006, 2014   Dr Mechele Collin    COLONOSCOPY N/A 03/27/2016   Procedure: COLONOSCOPY;  Surgeon: Earline Mayotte, MD;  Location: [facility] ENDOSCOPY;  Service: Endoscopy;  Laterality: N/A;   COLOSTOMY REVERSAL  2014   at [facility] Dr Joaquim Nam   KNEE SURGERY Right 10/2022   knee replacement   NASAL SINUS SURGERY     For fungal infection   NASAL SINUS SURGERY  8/05 8/06   OOPHORECTOMY     OSTOMY  05/22/2012   diverticulitis with abcess/ Dr Katrinka Blazing   ovarian cancer     Exploratory lap   PORTA CATH INSERTION N/A 05/26/2018   Procedure: PORTA CATH INSERTION;  Surgeon: Annice Needy, MD;  Location: [facility] INVASIVE CV LAB;  Service: Cardiovascular;  Laterality: N/A;   PORTA CATH REMOVAL N/A 11/12/2019   Procedure: PORTA CATH REMOVAL;  Surgeon: Annice Needy, MD;  Location: [facility] INVASIVE CV LAB;  Service: Cardiovascular;  Laterality: N/A;   TONSILLECTOMY AND ADENOIDECTOMY  1947    Social History   Socioeconomic History   Marital status: Widowed    Spouse name: Not on file   Number of children: 0   Years of education: Not on file   Highest education level: Not on file  Occupational History   Not on file  Tobacco Use   Smoking status: Never   Smokeless tobacco: Never  Vaping Use   Vaping status: Never Used  Substance and Sexual Activity   Alcohol use: No    Alcohol/week: 0.0 standard drinks of alcohol   Drug use: Yes   Sexual activity: Not Currently  Other Topics Concern   Not on file  Social History Narrative   Lives at home alone with dog   Social Determinants of Health    Financial Resource Strain: Low Risk  (01/01/2023)   Overall Financial Resource Strain (CARDIA)    Difficulty of Paying Living Expenses: Not hard at all  Food Insecurity: No Food Insecurity (01/01/2023)   Hunger Vital Sign    Worried About Running Out of Food in the Last Year: Never true    Ran Out of Food in the Last Year: Never true  Transportation Needs: No Transportation Needs (01/01/2023)   PRAPARE - Administrator, Civil Service (Medical): No    Lack of Transportation (Non-Medical): No  Physical Activity: Inactive (01/01/2023)   Exercise Vital Sign    Days of Exercise per Week: 0 days    Minutes of Exercise per Session: 0 min  Stress: No Stress Concern Present (01/01/2023)   [employer] of Occupational Health - Occupational Stress Questionnaire    Feeling of Stress : Not at all  Social Connections: Moderately Integrated (01/01/2023)  Social Advertising account executive [NHANES]    Frequency of Communication with Friends and Family: More than three times a week    Frequency of Social Gatherings with Friends and Family: More than three times a week    Attends Religious Services: More than 4 times per year    Active Member of [employer] or Organizations: Yes    Attends Banker Meetings: More than 4 times per year    Marital Status: Widowed  Intimate Partner Violence: Not At Risk (01/01/2023)   Humiliation, Afraid, Rape, and Kick questionnaire    Fear of Current or Ex-Partner: No    Emotionally Abused: No    Physically Abused: No    Sexually Abused: No    Family History  Problem Relation Age of Onset   Hypertension Mother    Coronary artery disease Mother    Heart disease Mother    Kidney disease Mother    Diabetes Mother    Cancer Mother        uterine cancer dx 90s   Diabetes Father    Kidney failure Father    Stroke Sister    Heart disease Sister        Visual merchandiser   Dementia Sister    Diabetes Brother    Coronary artery disease Brother     Cancer Brother        bladder cancer   Cancer Brother        Prostate   Cancer Maternal Aunt 49       ovarian cancer   Breast cancer Cousin      Current Outpatient Medications:    acetaminophen (TYLENOL) 650 MG CR tablet, Take 650 mg by mouth every 8 (eight) hours as needed for pain., Disp: , Rfl:    amLODipine (NORVASC) 2.5 MG tablet, Take 1 tablet (2.5 mg total) by mouth daily., Disp: 90 tablet, Rfl: 1   aspirin 81 MG chewable tablet, Chew by mouth in the morning and at bedtime. (Patient not taking: Reported on 04/12/2023), Disp: , Rfl:    Calcium Carbonate-Vitamin D 600-400 MG-UNIT tablet, Take 1 tablet by mouth daily. , Disp: , Rfl:    cholecalciferol (VITAMIN D3) 25 MCG (1000 UNIT) tablet, Take 1,000 Units by mouth daily. (Patient not taking: Reported on 04/12/2023), Disp: , Rfl:    meloxicam (MOBIC) 15 MG tablet, Take 15 mg by mouth daily. (Patient not taking: Reported on 04/12/2023), Disp: , Rfl:    Multiple Vitamins-Minerals (MULTIVITAMIN WITH MINERALS) tablet, Take 1 tablet by mouth daily., Disp: , Rfl:    tamoxifen (NOLVADEX) 20 MG tablet, Take 1 tablet (20 mg total) by mouth daily., Disp: 90 tablet, Rfl: 3 No current facility-administered medications for this visit.  Facility-Administered Medications Ordered in Other Visits:    sodium chloride 0.9 % 50 mL with famotidine (PEPCID) 20 mg infusion, 20 mg, Intravenous, Once, Brahmanday, Stefano Gaul R, MD   sodium chloride flush (NS) 0.9 % injection 10 mL, 10 mL, Intravenous, PRN, Earna Coder, MD, 10 mL at 06/24/18 0854  Physical exam:  Vitals:   04/12/23 1136  BP: (!) 126/57  Pulse: 74  Resp: 17  Temp: (!) 96.6 F (35.9 C)  TempSrc: Tympanic  SpO2: 96%  Weight: 166 lb 12.8 oz (75.7 kg)   Physical Exam Cardiovascular:     Rate and Rhythm: Normal rate and regular rhythm.     Heart sounds: Normal heart sounds.  Pulmonary:     Effort: Pulmonary effort is normal.  Breath sounds: Normal breath sounds.  Abdominal:      General: Bowel sounds are normal.     Palpations: Abdomen is soft.  Skin:    General: Skin is warm and dry.  Neurological:     Mental Status: She is alert and oriented to person, place, and time.    Breast exam was performed in seated and lying down position. Patient is status post right lumpectomy with a well-healed surgical scar. No evidence of any palpable masses. No evidence of axillary adenopathy. No evidence of any palpable masses or lumps in the left breast. No evidence of leftt axillary adenopathy      Latest Ref Rng & Units 10/04/2022   10:29 AM  CMP  Glucose 70 - 99 mg/dL 86   BUN 6 - 23 mg/dL 12   Creatinine 4.40 - 1.20 mg/dL 3.47   Sodium 425 - 956 mEq/L 143   Potassium 3.5 - 5.1 mEq/L 3.9   Chloride 96 - 112 mEq/L 107   CO2 19 - 32 mEq/L 27   Calcium 8.4 - 10.5 mg/dL 8.9       Latest Ref Rng & Units 10/04/2022   10:29 AM  CBC  WBC 4.0 - 10.5 K/uL 8.3   Hemoglobin 12.0 - 15.0 g/dL 38.7   Hematocrit 56.4 - 46.0 % 39.4   Platelets 150.0 - 400.0 K/uL 264.0     No images are attached to the encounter.  DG Bone Density  Result Date: 04/08/2023 EXAM: DUAL X-RAY ABSORPTIOMETRY (DXA) FOR BONE MINERAL DENSITY IMPRESSION: Your patient Vibha Ferdig completed a BMD test on 04/08/2023 using the [employer] iDXA DXA System (software version: 14.10) manufactured by [employer]. The following summarizes the results of our evaluation. Technologist: SCE PATIENT BIOGRAPHICAL: Name: Atiyah, Bauer Patient ID: [id number] Birth Date: 11-20-1933 Height: 63.0 in. Gender: Female Exam Date: 04/08/2023 Weight: 168.0 lbs. Indications: High Risk Meds, Height Loss, Oophorectomy Bilateral, Caucasian, History of Chemo, History of Breast Cancer, Postmenopausal, Hysterectomy, History of Radiation, History of Ovarian Cancer, Osteoarthritis, Advanced Age Fractures: Treatments: Multi-Vitamin, calcium w/ vit D, Tamoxifen, Prolia DENSITOMETRY RESULTS: Site      Region     Measured Date  Measured Age WHO Classification Young Adult T-score BMD         %Change vs. Previous Significant Change (*) AP Spine L1-L4 04/08/2025 87.0 Osteopenia -1.6 0.982 g/cm2 - - DualFemur Total Left 04/08/2023 87.0 Osteoporosis -2.5 0.697 g/cm2 -11.8% Yes DualFemur Total Left 04/06/2021 85.0 Osteopenia -1.7 0.790 g/cm2 3.5% Yes DualFemur Total Left 04/06/2019 83.0 Osteopenia -1.9 0.763 g/cm2 -1.0% - DualFemur Total Left 04/01/2017 81.0 Osteopenia -1.9 0.771 g/cm2 - - DualFemur Total Mean 04/08/2023 87.0 Osteopenia -2.3 0.714 g/cm2 -8.2% Yes DualFemur Total Mean 04/06/2021 85.0 Osteopenia -1.8 0.778 g/cm2 3.2% Yes DualFemur Total Mean 04/06/2019 83.0 Osteopenia -2.0 0.754 g/cm2 -3.8% Yes DualFemur Total Mean 04/01/2017 81.0 Osteopenia -1.8 0.784 g/cm2 - - ASSESSMENT: The BMD measured at Femur Total Left is 0.697 g/cm2 with a T-score of -2.5. This patient is considered osteoporotic according to World Health Organization [facility]) criteria. Compared with prior study, there has been a significant decrease in the total hip. The scan quality is good. World Health Organization [facility]) criteria for post-menopausal, Caucasian Women: Normal:                   T-score at or above -1 SD Osteopenia/low bone mass: T-score between -1 and -2.5 SD Osteoporosis:  T-score at or below -2.5 SD RECOMMENDATIONS: 1. All patients should optimize calcium and vitamin D intake. 2. Consider FDA-approved medical therapies in postmenopausal women and men aged 84 years and older, based on the following: a. A hip or vertebral(clinical or morphometric) fracture b. T-score < -2.5 at the femoral neck or spine after appropriate evaluation to exclude secondary causes c. Low bone mass (T-score between -1.0 and -2.5 at the femoral neck or spine) and a 10-year probability of a hip fracture > 3% or a 10-year probability of a major osteoporosis-related fracture > 20% based on the [place]-adapted WHO algorithm 3. Clinician judgment and/or patient preferences may  indicate treatment for people with 10-year fracture probabilities above or below these levels FOLLOW-UP: People with diagnosed cases of osteoporosis or at high risk for fracture should have regular bone mineral density tests. For patients eligible for Medicare, routine testing is allowed once every 2 years. The testing frequency can be increased to one year for patients who have rapidly progressing disease, those who are receiving or discontinuing medical therapy to restore bone mass, or have additional risk factors. I have reviewed this report, and agree with the above findings. [facility] Electronically Signed   By: Frederico Hamman M.D.   On: 04/08/2023 10:13     Assessment and plan- Patient is a 89 y.o. female  with history of right breast cancer ER/PR positive HER2 negative. She is here for routine for routine f/u of breast cancer  Clinically patient is doing well with no signs and symptoms of recurrence based on today's exam.  She will continue tamoxifen until February 2025 which would roughly mark 5 years from the time she started taking endocrine therapy.  With regards to osteoporosis. This patient is currently on Xgeva every 6 months which she started taking in February 2021.  Her present bone density scan shows worsening osteoporosis with T-score of -2.5 in her left femur as compared to 1.7 in 2022.  I would therefore like her to continue Xgeva for 1 more year before we consider discontinuation.  She gets this through Dr. Lorin Picket   Visit Diagnosis 1. Osteoporosis without current pathological fracture, unspecified osteoporosis type   2. Encounter for follow-up surveillance of breast cancer   3. Encounter for ongoing osteoporosis therapy, bisphosphonates   4. Encounter for monitoring tamoxifen therapy      Dr. Owens Shark, MD, MPH [facility] [phone] 04/12/2023 4:10 PM

## 2023-06-02 DIAGNOSIS — Z90710 Acquired absence of both cervix and uterus: Secondary | ICD-10-CM | POA: Insufficient documentation

## 2023-06-02 DIAGNOSIS — Z9049 Acquired absence of other specified parts of digestive tract: Secondary | ICD-10-CM | POA: Insufficient documentation

## 2023-06-02 DIAGNOSIS — Z96651 Presence of right artificial knee joint: Secondary | ICD-10-CM | POA: Insufficient documentation

## 2023-06-02 DIAGNOSIS — Z9089 Acquired absence of other organs: Secondary | ICD-10-CM | POA: Insufficient documentation

## 2023-06-10 ENCOUNTER — Encounter: Payer: Self-pay | Admitting: *Deleted

## 2023-06-10 MED ORDER — DENOSUMAB 60 MG/ML ~~LOC~~ SOSY
60.0000 mg | PREFILLED_SYRINGE | Freq: Once | SUBCUTANEOUS | Status: AC
Start: 2023-08-02 — End: 2023-10-18
  Administered 2023-10-18: 60 mg via SUBCUTANEOUS

## 2023-06-10 NOTE — Addendum Note (Signed)
Addended by: Warden Fillers on: 06/10/2023 01:24 PM   Modules accepted: Orders

## 2023-06-13 ENCOUNTER — Telehealth: Payer: Self-pay

## 2023-06-13 NOTE — Telephone Encounter (Signed)
 Prolia VOB initiated via [email or web address]  Next Prolia inj DUE: 07/31/23

## 2023-06-19 ENCOUNTER — Encounter: Payer: Self-pay | Admitting: Hematology and Oncology

## 2023-06-19 ENCOUNTER — Other Ambulatory Visit (HOSPITAL_COMMUNITY): Payer: Self-pay

## 2023-06-19 NOTE — Telephone Encounter (Signed)
 Marland Kitchen

## 2023-06-19 NOTE — Telephone Encounter (Signed)
 Pt ready for scheduling for PROLIA  on or after : 07/31/23  Out-of-pocket cost due at time of visit: $20  Number of injection/visits approved: 1  Primary: AET[employer]-MEDICARE Prolia  co-insurance: $10 Admin fee co-insurance: $10  Secondary: --- Prolia  co-insurance:  Admin fee co-insurance:   Medical Benefit Details: Date Benefits were checked: 06/18/23 Deductible: NO/ Coinsurance: $10/ Admin Fee: $10  Prior Auth: APPROVED PA# [id number] Expiration Date: 01/09/23-01/09/24  # of doses approved: 1  Pharmacy benefit: Copay $60 If patient wants fill through the pharmacy benefit please send prescription to:  [facility] , and include estimated need by date in rx notes. Pharmacy will ship medication directly to the office.  Patient NOT eligible for Prolia  Copay Card. Copay Card can make patient's cost as little as $25. Link to apply: https://www.amgensupportplus.com/copay  ** This summary of benefits is an estimation of the patient's out-of-pocket cost. Exact cost may very based on individual plan coverage.

## 2023-08-02 ENCOUNTER — Encounter: Payer: Self-pay | Admitting: *Deleted

## 2023-08-02 DIAGNOSIS — M810 Age-related osteoporosis without current pathological fracture: Secondary | ICD-10-CM

## 2023-08-05 ENCOUNTER — Telehealth: Payer: Self-pay

## 2023-08-05 ENCOUNTER — Encounter: Payer: Self-pay | Admitting: Hematology and Oncology

## 2023-08-05 NOTE — Telephone Encounter (Signed)
 Noted.

## 2023-08-05 NOTE — Telephone Encounter (Signed)
 Copied from CRM [id number]. Topic: Clinical - Medical Advice >> Aug 05, 2023  8:54 AM Isabell A wrote: Reason for CRM: Patient states she has shingles and it started Thursday night, patient would like to confirm if she can have a prescription called in or does she need to come in for an appointment.

## 2023-08-05 NOTE — Telephone Encounter (Signed)
 FYI- Called and spoke with patient. She thinks she may have shingles that started Thursday night. She says symptoms are mild and that family members who have had them before gave her recommendations of OTC regimens. She is using tylenol and L-lysine. She confirmed no lesions on her face/around her eyes. She has been scheduled for tomorrow morning with Dr Birdie Sons and gave UC advice if symptoms worsen.

## 2023-08-06 ENCOUNTER — Ambulatory Visit: Payer: Medicare HMO | Admitting: Family Medicine

## 2023-08-06 ENCOUNTER — Encounter: Payer: Self-pay | Admitting: Family Medicine

## 2023-08-06 VITALS — BP 136/74 | HR 90 | Temp 98.2°F | Wt 165.0 lb

## 2023-08-06 DIAGNOSIS — B029 Zoster without complications: Secondary | ICD-10-CM | POA: Diagnosis not present

## 2023-08-06 MED ORDER — VALACYCLOVIR HCL 1 G PO TABS
1000.0000 mg | ORAL_TABLET | Freq: Three times a day (TID) | ORAL | 0 refills | Status: DC
Start: 2023-08-06 — End: 2023-08-23

## 2023-08-06 NOTE — Assessment & Plan Note (Addendum)
 Rash is consistent with shingles.  Will treat with Valtrex as outlined.  Discussed continued use of Tylenol for pain.  If that is not effective she can contact us.  Discussed if the itching becomes unbearable she could try Claritin over-the-counter.  Advised to keep the area covered.  Discussed avoiding anybody who is immunocompromised, pregnant, or has not had chickenpox or the chickenpox vaccine.  Advised to contact us if she develops any side effects with the Valtrex.

## 2023-08-06 NOTE — Progress Notes (Signed)
  [name] Alar, MD Phone: [phone]  [name] [name] is a 89 y.o. female who presents today for same-day visit.  Rash: Patient notes onset of symptoms last Thursday night.  Thinks it is shingles.  Notes itching and some discomfort.  Taking Tylenol has helped with the pain.  She notes a few new lesions over her anterior right abdomen.  Has not had the shingles vaccine previously.  Social History   Tobacco Use  Smoking Status Never  Smokeless Tobacco Never    Current Outpatient Medications on File Prior to Visit  Medication Sig Dispense Refill   acetaminophen (TYLENOL) 650 MG CR tablet Take 650 mg by mouth every 8 (eight) hours as needed for pain.     amLODipine (NORVASC) 2.5 MG tablet Take 1 tablet (2.5 mg total) by mouth daily. 90 tablet 1   aspirin 81 MG chewable tablet Chew by mouth in the morning and at bedtime.     Calcium Carbonate-Vitamin D 600-400 MG-UNIT tablet Take 1 tablet by mouth daily.      cholecalciferol (VITAMIN D3) 25 MCG (1000 UNIT) tablet Take 1,000 Units by mouth daily.     meloxicam (MOBIC) 15 MG tablet Take 15 mg by mouth daily.     Multiple Vitamins-Minerals (MULTIVITAMIN WITH MINERALS) tablet Take 1 tablet by mouth daily.     tamoxifen (NOLVADEX) 20 MG tablet Take 1 tablet (20 mg total) by mouth daily. 90 tablet 3   Current Facility-Administered Medications on File Prior to Visit  Medication Dose Route Frequency Provider Last Rate Last Admin   denosumab (PROLIA) injection 60 mg  60 mg Subcutaneous Once Dale [place], MD       sodium chloride 0.9 % 50 mL with famotidine (PEPCID) 20 mg infusion  20 mg Intravenous Once Louretta Shorten R, MD       sodium chloride flush (NS) 0.9 % injection 10 mL  10 mL Intravenous PRN Earna Coder, MD   10 mL at 06/24/18 0854     ROS see history of present illness  Objective  Physical Exam Vitals:   08/06/23 1019  BP: 136/74  Pulse: 90  Temp: 98.2 F (36.8 C)  SpO2: 97%    BP Readings from Last  3 Encounters:  08/06/23 136/74  04/12/23 (!) 126/57  10/04/22 134/72   Wt Readings from Last 3 Encounters:  08/06/23 165 lb (74.8 kg)  04/12/23 166 lb 12.8 oz (75.7 kg)  01/01/23 168 lb (76.2 kg)    Physical Exam     Assessment/Plan: Please see individual problem list.  Herpes zoster without complication Assessment & Plan: Rash is consistent with shingles.  Will treat with Valtrex as outlined.  Discussed continued use of Tylenol for pain.  If that is not effective she can contact us.  Discussed if the itching becomes unbearable she could try Claritin over-the-counter.  Advised to keep the area covered.  Discussed avoiding anybody who is immunocompromised, pregnant, or has not had chickenpox or the chickenpox vaccine.  Advised to contact us if she develops any side effects with the Valtrex.  Orders: -     valACYclovir HCl; Take 1 tablet (1,000 mg total) by mouth 3 (three) times daily.  Dispense: 30 tablet; Refill: 0    Return if symptoms worsen or fail to improve.   [name] Alar, MD [facility]

## 2023-08-06 NOTE — Patient Instructions (Signed)
 Nice to see you. You have shingles. Please take the Valtrex as prescribed. If you notice any side effects please let us know.

## 2023-08-07 ENCOUNTER — Other Ambulatory Visit: Payer: Self-pay | Admitting: Internal Medicine

## 2023-08-11 NOTE — Discharge Instructions (Signed)
 Instructions after Total Knee Replacement   [name] [name] P. Angie Fava., M.D.    Dept. of Orthopaedics & Sports Medicine [facility] [address] [place], [place]  [address]  Phone: [phone]   Fax: [phone]       [email or web address]       DIET: Drink plenty of non-alcoholic fluids. Resume your normal diet. Include foods high in fiber.  ACTIVITY:  You may use crutches or a walker with weight-bearing as tolerated, unless instructed otherwise. You may be weaned off of the walker or crutches by your Physical Therapist.  Do NOT place pillows under the knee. Anything placed under the knee could limit your ability to straighten the knee.   Use the Bone Foam 3 times a day for 30 minutes each session to help straighten the knee. Continue doing gentle exercises. Exercising will reduce the pain and swelling, increase motion, and prevent muscle weakness.   Please continue to use the TED compression stockings for 6 weeks. You may remove the stockings at night, but should reapply them in the morning. Do not drive or operate any equipment until instructed.  WOUND CARE:  The initial dressing (Aquacel) can remain in place for 7 days (see separate instructions). Continue to use the PolarCare or ice packs periodically to reduce pain and swelling. You may bathe or shower after the staples are removed at the first office visit following surgery.  MEDICATIONS: You may resume your regular medications. Please take the pain medication as prescribed on the medication. Do not take pain medication on an empty stomach. Unless instructed otherwise, you should take an enteric-coated aspirin 81 mg. TWICE a day. (This along with elevation will help reduce the possibility of blood clots/phlebitis in your operated leg.) Use a stool softener (such as Senokot-S or Colace) daily and a laxative (such as Miralax or Dulcolax) as needed to prevent constipation.  Do not drive or drink alcoholic beverages when  taking pain medications.  CALL THE OFFICE FOR: Temperature above 101 degrees Excessive bleeding or drainage on the dressing. Excessive swelling, coldness, or paleness of the toes. Persistent nausea and vomiting.  FOLLOW-UP:  You should have an appointment to return to the office in 10-14 days after surgery. Arrangements have been made for continuation of Physical Therapy (either home therapy or outpatient therapy).     [facility] Department Directory         [email or web address]       [email or web address]          Cardiology  Appointments: [place] - [phone]  Endocrinology  Appointments: [place] [phone] [place] - [phone]  Gastroenterology  Appointments: [employer] [phone] [place] - [phone]        General Surgery   Appointments: [facility]  Internal Medicine/Family Medicine  Appointments: [facility] [place] - [phone] [place] - [phone]  Metabolic and Weigh Loss Surgery  Appointments: [facility]        Neurology  Appointments: [place] [phone] [place] - [phone]  Neurosurgery  Appointments: [place]  Obstetrics & Gynecology  Appointments: [place] [phone] [place] - [phone]        Pediatrics  Appointments: [place] [phone] [place] - [phone]  Physiatry  Appointments: [place] [phone]  Physical Therapy  Appointments: [place] - [phone]        Podiatry  Appointments: [place] [phone] [place] - [phone]  Pulmonology  Appointments: [place] [facility]  Rheumatology  Appointments: [place] [phone]  [place] Location: [facility]  [address] [place], [place]  [address]  [place] Location: [facility]. [address] [place], [place]  [address]  [place]  Location: [facility] [address] [place], [place]  [address]

## 2023-08-18 NOTE — Telephone Encounter (Signed)
 I Spoke with patient this morning to see if she was ready to schedule her overdue Prolia injection.    She states that her oncologist feels that she should try another regimen other than Prolia due to the Osteoporosis in her hip.  I notifed pt that I will send to PCP & someone will follow-up this week.

## 2023-08-19 NOTE — Telephone Encounter (Signed)
 I reviewed Dr Assunta Gambles last office note 04/2023 and per her note, she recommended continuing current osteoporosis treatment for one more year. If she has questions or wants to change, let me know.

## 2023-08-20 NOTE — Telephone Encounter (Signed)
 Noted.

## 2023-08-20 NOTE — Telephone Encounter (Signed)
 Ok to schedule appt this week to discuss and evaluate her ongoing symptoms.

## 2023-08-20 NOTE — Telephone Encounter (Signed)
 Spoke says she spoke Dr Smith Robert and was told that according to the recent scan, she had Osteoporosis in her hip. Patient says she currently has a shingles outbreak and she has had it for two weeks and she is still itching, but applying an OTC medication that was recommended by the pharmacist (Eucerin). Patient says she was not prescribed any medication by the provider she was seen by for the outbreak. Patient says if Dr Lorin Picket has any solutions to her problems, she is more than welcoming to them. Please advise?Patient she says she is unaware when she should come back to get the Prolia.

## 2023-08-23 ENCOUNTER — Encounter: Payer: Self-pay | Admitting: Internal Medicine

## 2023-08-23 ENCOUNTER — Ambulatory Visit (INDEPENDENT_AMBULATORY_CARE_PROVIDER_SITE_OTHER): Admitting: Internal Medicine

## 2023-08-23 VITALS — BP 132/78 | HR 71 | Temp 97.7°F | Ht 63.0 in | Wt 167.4 lb

## 2023-08-23 DIAGNOSIS — E7800 Pure hypercholesterolemia, unspecified: Secondary | ICD-10-CM | POA: Diagnosis not present

## 2023-08-23 DIAGNOSIS — B029 Zoster without complications: Secondary | ICD-10-CM

## 2023-08-23 DIAGNOSIS — D649 Anemia, unspecified: Secondary | ICD-10-CM

## 2023-08-23 DIAGNOSIS — I10 Essential (primary) hypertension: Secondary | ICD-10-CM

## 2023-08-23 DIAGNOSIS — M810 Age-related osteoporosis without current pathological fracture: Secondary | ICD-10-CM

## 2023-08-23 DIAGNOSIS — E559 Vitamin D deficiency, unspecified: Secondary | ICD-10-CM | POA: Diagnosis not present

## 2023-08-23 DIAGNOSIS — R739 Hyperglycemia, unspecified: Secondary | ICD-10-CM

## 2023-08-23 MED ORDER — GABAPENTIN 100 MG PO CAPS: 100.0000 mg | ORAL_CAPSULE | Freq: Three times a day (TID) | ORAL | 2 refills | Status: DC

## 2023-08-23 NOTE — Progress Notes (Signed)
 Subjective:    Patient ID: [name] [name], female    DOB: 1934-05-29, 89 y.o.   MRN: [id number]  Patient here for  Chief Complaint  Patient presents with   Herpes Zoster    Completed medication but still painful on right side of body    HPI Here for work in appt - diagnosed with shingles 08/06/23. Treated with valtrex. Completed. Reports persistent pain and itching. Sitting in shower, helps with itching. Has been using eucerin cream. No fever. No vomiting. Had questions regarding prolia. Her present bone density scan shows worsening osteoporosis with T-score of -2.5 in her left femur as compared to 1.7 in 2022. Recommended to continue treatment for 1 more year before consider discontinuation.    Past Medical History:  Diagnosis Date   Breast cancer (HCC)    Diverticulitis of sigmoid colon    Endometriosis    Family history of breast cancer    Family history of ovarian cancer    Family history of prostate cancer    Family history of uterine cancer    Fibrocystic breast disease    Glaucoma    H/O urinary incontinence    History of chicken pox    History of colon polyps 2014   Hyperlipidemia    Hypertension    Ovarian cancer (HCC) 2002   Chemo tx's @ [facility] with Total Hysterectomy. Dr Logan Bores   Personal history of chemotherapy    ovarian cancer   Personal history of ovarian cancer    Personal history of radiation therapy    Port-A-Cath in place    Sinusitis    Tuberculosis    positve skin test   Past Surgical History:  Procedure Laterality Date   ABDOMINAL HYSTERECTOMY  2003   complete Dr Logan Bores   ABSCESS DRAINAGE  2017   right buttock at [facility]   APPENDECTOMY  2003   BREAST EXCISIONAL BIOPSY Left 1993   excisional bx. negative results   BREAST LUMPECTOMY Right 03/18/2018   done at [facility] breast ca rad and chemo Invasive ductal carcinoma with focal squamous differentiation, Nottingham grade 3, is identified forming a 6 mm mass, associated with high-grade ductal carcinoma in  situ, radial scar and biopsy site changes.   BREAST SURGERY Left 1982   papilloma removal   COLONOSCOPY  2006, 2014   Dr Mechele Collin    COLONOSCOPY N/A 03/27/2016   Procedure: COLONOSCOPY;  Surgeon: Earline Mayotte, MD;  Location: [facility] ENDOSCOPY;  Service: Endoscopy;  Laterality: N/A;   COLOSTOMY REVERSAL  2014   at [facility] Dr Joaquim Nam   KNEE SURGERY Right 10/2022   knee replacement   NASAL SINUS SURGERY     For fungal infection   NASAL SINUS SURGERY  8/05 8/06   OOPHORECTOMY     OSTOMY  05/22/2012   diverticulitis with abcess/ Dr Katrinka Blazing   ovarian cancer     Exploratory lap   PORTA CATH INSERTION N/A 05/26/2018   Procedure: PORTA CATH INSERTION;  Surgeon: Annice Needy, MD;  Location: [facility] INVASIVE CV LAB;  Service: Cardiovascular;  Laterality: N/A;   PORTA CATH REMOVAL N/A 11/12/2019   Procedure: PORTA CATH REMOVAL;  Surgeon: Annice Needy, MD;  Location: [facility] INVASIVE CV LAB;  Service: Cardiovascular;  Laterality: N/A;   TONSILLECTOMY AND ADENOIDECTOMY  1947   Family History  Problem Relation Age of Onset   Hypertension Mother    Coronary artery disease Mother    Heart disease Mother    Kidney disease Mother  Diabetes Mother    Cancer Mother        uterine cancer dx 37s   Diabetes Father    Kidney failure Father    Stroke Sister    Heart disease Sister        Visual merchandiser   Dementia Sister    Diabetes Brother    Coronary artery disease Brother    Cancer Brother        bladder cancer   Cancer Brother        Prostate   Cancer Maternal Aunt 49       ovarian cancer   Breast cancer Cousin    Social History   Socioeconomic History   Marital status: Widowed    Spouse name: Not on file   Number of children: 0   Years of education: Not on file   Highest education level: Not on file  Occupational History   Not on file  Tobacco Use   Smoking status: Never   Smokeless tobacco: Never  Vaping Use   Vaping status: Never Used  Substance and Sexual Activity   Alcohol  use: No    Alcohol/week: 0.0 standard drinks of alcohol   Drug use: Yes   Sexual activity: Not Currently  Other Topics Concern   Not on file  Social History Narrative   Lives at home alone with dog   Social Drivers of Health   Financial Resource Strain: Low Risk  (01/01/2023)   Overall Financial Resource Strain (CARDIA)    Difficulty of Paying Living Expenses: Not hard at all  Food Insecurity: No Food Insecurity (01/01/2023)   Hunger Vital Sign    Worried About Running Out of Food in the Last Year: Never true    Ran Out of Food in the Last Year: Never true  Transportation Needs: No Transportation Needs (01/01/2023)   PRAPARE - Administrator, Civil Service (Medical): No    Lack of Transportation (Non-Medical): No  Physical Activity: Inactive (01/01/2023)   Exercise Vital Sign    Days of Exercise per Week: 0 days    Minutes of Exercise per Session: 0 min  Stress: No Stress Concern Present (01/01/2023)   [employer] of Occupational Health - Occupational Stress Questionnaire    Feeling of Stress : Not at all  Social Connections: Moderately Integrated (01/01/2023)   Social Connection and Isolation Panel [NHANES]    Frequency of Communication with Friends and Family: More than three times a week    Frequency of Social Gatherings with Friends and Family: More than three times a week    Attends Religious Services: More than 4 times per year    Active Member of [employer] or Organizations: Yes    Attends Banker Meetings: More than 4 times per year    Marital Status: Widowed     Review of Systems  Constitutional:  Negative for appetite change and unexpected weight change.  HENT:  Negative for congestion and sinus pressure.   Respiratory:  Negative for cough, chest tightness and shortness of breath.   Cardiovascular:  Negative for chest pain and palpitations.  Gastrointestinal:  Negative for diarrhea, nausea and vomiting.  Genitourinary:  Negative for  difficulty urinating and dysuria.  Musculoskeletal:  Negative for joint swelling and myalgias.       Shingles pain as outlined.   Skin:  Positive for rash. Negative for color change.  Neurological:  Negative for dizziness and headaches.  Psychiatric/Behavioral:  Negative for agitation and dysphoric mood.  Objective:     BP 132/78   Pulse 71   Temp 97.7 F (36.5 C) (Oral)   Ht 5\' 3"  (1.6 m)   Wt 167 lb 6.4 oz (75.9 kg)   SpO2 97%   BMI 29.65 kg/m  Wt Readings from Last 3 Encounters:  08/23/23 167 lb 6.4 oz (75.9 kg)  08/06/23 165 lb (74.8 kg)  04/12/23 166 lb 12.8 oz (75.7 kg)    Physical Exam Vitals reviewed.  Constitutional:      General: She is not in acute distress.    Appearance: Normal appearance.  HENT:     Head: Normocephalic and atraumatic.     Right Ear: External ear normal.     Left Ear: External ear normal.  Eyes:     General: No scleral icterus.       Right eye: No discharge.        Left eye: No discharge.     Conjunctiva/sclera: Conjunctivae normal.  Neck:     Thyroid: No thyromegaly.  Cardiovascular:     Rate and Rhythm: Normal rate and regular rhythm.  Pulmonary:     Effort: No respiratory distress.     Breath sounds: Normal breath sounds. No wheezing.  Abdominal:     General: Bowel sounds are normal.     Palpations: Abdomen is soft.     Tenderness: There is no abdominal tenderness.  Musculoskeletal:        General: No tenderness.     Cervical back: Neck supple. No tenderness.  Lymphadenopathy:     Cervical: No cervical adenopathy.  Skin:    Comments: Persistent shingles rash - lateral abd/flank extending around to back   Neurological:     Mental Status: She is alert.  Psychiatric:        Mood and Affect: Mood normal.        Behavior: Behavior normal.         Outpatient Encounter Medications as of 08/23/2023  Medication Sig   acetaminophen (TYLENOL) 650 MG CR tablet Take 650 mg by mouth every 8 (eight) hours as needed for  pain.   amLODipine (NORVASC) 2.5 MG tablet TAKE 1 TABLET BY MOUTH EVERY DAY   aspirin 81 MG chewable tablet Chew by mouth in the morning and at bedtime.   Calcium Carbonate-Vitamin D 600-400 MG-UNIT tablet Take 1 tablet by mouth daily.    cholecalciferol (VITAMIN D3) 25 MCG (1000 UNIT) tablet Take 1,000 Units by mouth daily.   gabapentin (NEURONTIN) 100 MG capsule Take 1 capsule (100 mg total) by mouth 3 (three) times daily.   meloxicam (MOBIC) 15 MG tablet Take 15 mg by mouth daily.   Multiple Vitamins-Minerals (MULTIVITAMIN WITH MINERALS) tablet Take 1 tablet by mouth daily.   tamoxifen (NOLVADEX) 20 MG tablet Take 1 tablet (20 mg total) by mouth daily.   [DISCONTINUED] valACYclovir (VALTREX) 1000 MG tablet Take 1 tablet (1,000 mg total) by mouth 3 (three) times daily. (Patient not taking: Reported on 08/23/2023)   Facility-Administered Encounter Medications as of 08/23/2023  Medication   denosumab (PROLIA) injection 60 mg   sodium chloride 0.9 % 50 mL with famotidine (PEPCID) 20 mg infusion   sodium chloride flush (NS) 0.9 % injection 10 mL     Lab Results  Component Value Date   WBC 8.3 10/04/2022   HGB 12.7 10/04/2022   HCT 39.4 10/04/2022   PLT 264.0 10/04/2022   GLUCOSE 86 10/04/2022   CHOL 147 07/19/2022   TRIG 176.0 (H) 07/19/2022  HDL 56.60 07/19/2022   LDLCALC 55 07/19/2022   ALT 18 07/19/2022   AST 20 07/19/2022   NA 143 10/04/2022   K 3.9 10/04/2022   CL 107 10/04/2022   CREATININE 0.61 10/04/2022   BUN 12 10/04/2022   CO2 27 10/04/2022   TSH 2.47 05/07/2022   INR 1.0 05/21/2012   HGBA1C 5.7 10/04/2022    DG Bone Density Result Date: 04/08/2023 EXAM: DUAL X-RAY ABSORPTIOMETRY (DXA) FOR BONE MINERAL DENSITY IMPRESSION: Your patient [name] [name] completed a BMD test on 04/08/2023 using the [employer] iDXA DXA System (software version: 14.10) manufactured by [employer]. The following summarizes the results of our evaluation. Technologist: SCE PATIENT  BIOGRAPHICAL: Name: [name] [name], [name] [name] Patient ID: [id number] Birth Date: [birth year] Height: 63.0 in. Gender: Female Exam Date: 04/08/2023 Weight: 168.0 lbs. Indications: High Risk Meds, Height Loss, Oophorectomy Bilateral, Caucasian, History of Chemo, History of Breast Cancer, Postmenopausal, Hysterectomy, History of Radiation, History of Ovarian Cancer, Osteoarthritis, Advanced Age Fractures: Treatments: Multi-Vitamin, calcium w/ vit D, Tamoxifen, Prolia DENSITOMETRY RESULTS: Site      Region     Measured Date Measured Age WHO Classification Young Adult T-score BMD         %Change vs. Previous Significant Change (*) AP Spine L1-L4 04/08/2023 87.0 Osteopenia -1.6 0.982 g/cm2 - - DualFemur Total Left 04/08/2023 87.0 Osteoporosis -2.5 0.697 g/cm2 -11.8% Yes DualFemur Total Left 04/06/2021 85.0 Osteopenia -1.7 0.790 g/cm2 3.5% Yes DualFemur Total Left 04/06/2019 83.0 Osteopenia -1.9 0.763 g/cm2 -1.0% - DualFemur Total Left 04/01/2017 81.0 Osteopenia -1.9 0.771 g/cm2 - - DualFemur Total Mean 04/08/2023 87.0 Osteopenia -2.3 0.714 g/cm2 -8.2% Yes DualFemur Total Mean 04/06/2021 85.0 Osteopenia -1.8 0.778 g/cm2 3.2% Yes DualFemur Total Mean 04/06/2019 83.0 Osteopenia -2.0 0.754 g/cm2 -3.8% Yes DualFemur Total Mean 04/01/2017 81.0 Osteopenia -1.8 0.784 g/cm2 - - ASSESSMENT: The BMD measured at Femur Total Left is 0.697 g/cm2 with a T-score of -2.5. This patient is considered osteoporotic according to World Health Organization [facility]) criteria. Compared with prior study, there has been a significant decrease in the total hip. The scan quality is good. World Science writer [facility]) criteria for post-menopausal, Caucasian Women: Normal:                   T-score at or above -1 SD Osteopenia/low bone mass: T-score between -1 and -2.5 SD Osteoporosis:             T-score at or below -2.5 SD RECOMMENDATIONS: 1. All patients should optimize calcium and vitamin D intake. 2. Consider FDA-approved medical therapies in postmenopausal  women and men aged 4 years and older, based on the following: a. A hip or vertebral(clinical or morphometric) fracture b. T-score < -2.5 at the femoral neck or spine after appropriate evaluation to exclude secondary causes c. Low bone mass (T-score between -1.0 and -2.5 at the femoral neck or spine) and a 10-year probability of a hip fracture > 3% or a 10-year probability of a major osteoporosis-related fracture > 20% based on the [place]-adapted WHO algorithm 3. Clinician judgment and/or patient preferences may indicate treatment for people with 10-year fracture probabilities above or below these levels FOLLOW-UP: People with diagnosed cases of osteoporosis or at high risk for fracture should have regular bone mineral density tests. For patients eligible for Medicare, routine testing is allowed once every 2 years. The testing frequency can be increased to one year for patients who have rapidly progressing disease, those who are receiving or discontinuing medical therapy to restore  bone mass, or have additional risk factors. I have reviewed this report, and agree with the above findings. [facility] Electronically Signed   By: Frederico Hamman M.D.   On: 04/08/2023 10:13       Assessment & Plan:  Hypertension, essential Assessment & Plan: Continue amlodipine 2.5mg . Follow pressures.  Follow metabolic panel.   Orders: -     Basic metabolic panel; Future  Hypercholesterolemia Assessment & Plan: Follow lipid panel.   Orders: -     Hepatic function panel; Future -     Lipid panel; Future -     TSH; Future  Osteoporosis without current pathological fracture, unspecified osteoporosis type Assessment & Plan: Her recent bone density scan shows worsening osteoporosis with T-score of -2.5 in her left femur as compared to 1.7 in 2022. Oncology recommended to continue current treatment for 1 more year before we consider discontinuation. Continue calcium and vitamin D and weight bearing  exercise.    Vitamin D deficiency -     VITAMIN D 25 Hydroxy (Vit-D Deficiency, Fractures); Future  Anemia, unspecified type -     CBC with Differential/Platelet; Future  Hyperglycemia Assessment & Plan: Follow met b and A1c.   Orders: -     Hemoglobin A1c; Future  Herpes zoster without complication Assessment & Plan: Recently diagnosed with shingles. Persistent rash and pain/itching. Completed valtrex. Can continue tylenol. Trial of gabapentin 100mg  tid. Discussed possible side effects of the medication. Follow.  Call with update.    Other orders -     Gabapentin; Take 1 capsule (100 mg total) by mouth 3 (three) times daily.  Dispense: 90 capsule; Refill: 2     Dale Kanarraville, MD

## 2023-08-25 ENCOUNTER — Encounter: Payer: Self-pay | Admitting: Internal Medicine

## 2023-08-25 NOTE — Assessment & Plan Note (Signed)
 Continue amlodipine 2.5mg . Follow pressures.  Follow metabolic panel.

## 2023-08-25 NOTE — Assessment & Plan Note (Signed)
 Follow met b and A1c.

## 2023-08-25 NOTE — Assessment & Plan Note (Signed)
 Recently diagnosed with shingles. Persistent rash and pain/itching. Completed valtrex. Can continue tylenol. Trial of gabapentin 100mg  tid. Discussed possible side effects of the medication. Follow.  Call with update.

## 2023-08-25 NOTE — Assessment & Plan Note (Signed)
 Her recent bone density scan shows worsening osteoporosis with T-score of -2.5 in her left femur as compared to 1.7 in 2022. Oncology recommended to continue current treatment for 1 more year before we consider discontinuation. Continue calcium and vitamin D and weight bearing exercise.

## 2023-08-25 NOTE — Assessment & Plan Note (Signed)
 Follow lipid panel.

## 2023-08-26 ENCOUNTER — Ambulatory Visit: Admit: 2023-08-26 | Payer: Medicare HMO | Admitting: Orthopedic Surgery

## 2023-08-26 DIAGNOSIS — D649 Anemia, unspecified: Secondary | ICD-10-CM

## 2023-08-26 DIAGNOSIS — M1712 Unilateral primary osteoarthritis, left knee: Secondary | ICD-10-CM

## 2023-08-26 DIAGNOSIS — E538 Deficiency of other specified B group vitamins: Secondary | ICD-10-CM

## 2023-08-26 SURGERY — ARTHROPLASTY, KNEE, TOTAL, USING IMAGELESS COMPUTER-ASSISTED NAVIGATION
Anesthesia: Choice | Site: Knee | Laterality: Left

## 2023-08-27 NOTE — Telephone Encounter (Signed)
 Pt was seen on 08/23/23 & scheduled for fasting labs on 3/28. Is she okay to get her Prolia shot on 3/28 or does she need to wait a little longer based on previous message?

## 2023-08-28 NOTE — Telephone Encounter (Signed)
 Notify Ms Rouillard that I spoke to Dr Smith Robert. Given she is on prolia and given decrease in bone density despite prolia, she is recommending a referral to endocrinology. If agreeable, let me know and I can place the order for the referral.

## 2023-08-29 NOTE — Telephone Encounter (Signed)
 Patient is aware of below. Agreeable to see endocrinology.

## 2023-08-29 NOTE — Telephone Encounter (Signed)
Order placed for endocrinology referral.  

## 2023-09-06 ENCOUNTER — Other Ambulatory Visit (INDEPENDENT_AMBULATORY_CARE_PROVIDER_SITE_OTHER)

## 2023-09-06 DIAGNOSIS — E559 Vitamin D deficiency, unspecified: Secondary | ICD-10-CM | POA: Diagnosis not present

## 2023-09-06 DIAGNOSIS — R739 Hyperglycemia, unspecified: Secondary | ICD-10-CM

## 2023-09-06 DIAGNOSIS — D649 Anemia, unspecified: Secondary | ICD-10-CM | POA: Diagnosis not present

## 2023-09-06 DIAGNOSIS — E7800 Pure hypercholesterolemia, unspecified: Secondary | ICD-10-CM | POA: Diagnosis not present

## 2023-09-06 DIAGNOSIS — I10 Essential (primary) hypertension: Secondary | ICD-10-CM | POA: Diagnosis not present

## 2023-09-06 LAB — CBC WITH DIFFERENTIAL/PLATELET
Basophils Absolute: 0.1 10*3/uL (ref 0.0–0.1)
Basophils Relative: 0.7 % (ref 0.0–3.0)
Eosinophils Absolute: 0.1 10*3/uL (ref 0.0–0.7)
Eosinophils Relative: 0.9 % (ref 0.0–5.0)
HCT: 39.4 % (ref 36.0–46.0)
Hemoglobin: 13 g/dL (ref 12.0–15.0)
Lymphocytes Relative: 30.1 % (ref 12.0–46.0)
Lymphs Abs: 2.2 10*3/uL (ref 0.7–4.0)
MCHC: 32.9 g/dL (ref 30.0–36.0)
MCV: 93.3 fl (ref 78.0–100.0)
Monocytes Absolute: 0.7 10*3/uL (ref 0.1–1.0)
Monocytes Relative: 9 % (ref 3.0–12.0)
Neutro Abs: 4.3 10*3/uL (ref 1.4–7.7)
Neutrophils Relative %: 59.3 % (ref 43.0–77.0)
Platelets: 251 10*3/uL (ref 150.0–400.0)
RBC: 4.22 Mil/uL (ref 3.87–5.11)
RDW: 15.6 % — ABNORMAL HIGH (ref 11.5–15.5)
WBC: 7.3 10*3/uL (ref 4.0–10.5)

## 2023-09-06 LAB — HEPATIC FUNCTION PANEL
ALT: 13 U/L (ref 0–35)
AST: 17 U/L (ref 0–37)
Albumin: 3.7 g/dL (ref 3.5–5.2)
Alkaline Phosphatase: 54 U/L (ref 39–117)
Bilirubin, Direct: 0.1 mg/dL (ref 0.0–0.3)
Total Bilirubin: 0.4 mg/dL (ref 0.2–1.2)
Total Protein: 6.4 g/dL (ref 6.0–8.3)

## 2023-09-06 LAB — LIPID PANEL
Cholesterol: 141 mg/dL (ref 0–200)
HDL: 53.7 mg/dL (ref >39.00)
LDL Cholesterol: 60 mg/dL (ref 0–99)
NonHDL: 87.29
Total CHOL/HDL Ratio: 3
Triglycerides: 137 mg/dL (ref 0.0–149.0)
VLDL: 27.4 mg/dL (ref 0.0–40.0)

## 2023-09-06 LAB — BASIC METABOLIC PANEL WITH GFR
BUN: 14 mg/dL (ref 6–23)
CO2: 27 meq/L (ref 19–32)
Calcium: 9 mg/dL (ref 8.4–10.5)
Chloride: 108 meq/L (ref 96–112)
Creatinine, Ser: 0.57 mg/dL (ref 0.40–1.20)
GFR: 81.68 mL/min (ref >60.00)
Glucose, Bld: 85 mg/dL (ref 70–99)
Potassium: 3.8 meq/L (ref 3.5–5.1)
Sodium: 142 meq/L (ref 135–145)

## 2023-09-06 LAB — TSH: TSH: 2.15 u[IU]/mL (ref 0.35–5.50)

## 2023-09-06 LAB — VITAMIN D 25 HYDROXY (VIT D DEFICIENCY, FRACTURES): VITD: 40.72 ng/mL (ref 30.00–100.00)

## 2023-09-07 LAB — HEMOGLOBIN A1C: Hgb A1c MFr Bld: 5.6 % (ref 4.6–6.5)

## 2023-09-08 ENCOUNTER — Encounter: Payer: Self-pay | Admitting: Internal Medicine

## 2023-10-03 ENCOUNTER — Telehealth: Payer: Self-pay | Admitting: Internal Medicine

## 2023-10-03 NOTE — Telephone Encounter (Signed)
 I received a note from endocrinology regarding osteoporosis treatment for [name] [name]. It was recommended for her to continue with prolia  injections for now. She is overdue. Please call and see if agreeable to restart prolia  injections and if agreeable, please ask Elease Grice what we need to do to get her restarted.

## 2023-10-04 NOTE — Telephone Encounter (Signed)
 Spoke with pt, she is agreeable to restart prolia . Advised that someone would be getting in touch with her.

## 2023-10-11 ENCOUNTER — Inpatient Hospital Stay: Payer: Medicare HMO | Attending: Oncology | Admitting: Oncology

## 2023-10-11 ENCOUNTER — Encounter: Payer: Self-pay | Admitting: Oncology

## 2023-10-11 VITALS — BP 146/63 | HR 63 | Temp 97.9°F | Resp 16 | Wt 160.0 lb

## 2023-10-11 DIAGNOSIS — Z853 Personal history of malignant neoplasm of breast: Secondary | ICD-10-CM | POA: Insufficient documentation

## 2023-10-11 DIAGNOSIS — Z9221 Personal history of antineoplastic chemotherapy: Secondary | ICD-10-CM | POA: Insufficient documentation

## 2023-10-11 DIAGNOSIS — M810 Age-related osteoporosis without current pathological fracture: Secondary | ICD-10-CM | POA: Diagnosis not present

## 2023-10-11 DIAGNOSIS — Z923 Personal history of irradiation: Secondary | ICD-10-CM | POA: Diagnosis not present

## 2023-10-11 DIAGNOSIS — Z08 Encounter for follow-up examination after completed treatment for malignant neoplasm: Secondary | ICD-10-CM | POA: Diagnosis not present

## 2023-10-11 NOTE — Addendum Note (Signed)
 Addended by: Marilyn Shropshire on: 10/11/2023 11:14 AM   Modules accepted: Orders

## 2023-10-11 NOTE — Progress Notes (Signed)
 Hematology/Oncology Consult note [facility]  [phone] [phone]  Patient Care Team: Dellar Fenton, MD as PCP - General (Internal Medicine) Lovetta Rucks Robbi Childs, MD (Inactive) as Consulting Physician (Family Medicine) Jerlean Mood, MD (General Surgery) Dellar Fenton, MD (Internal Medicine) Marquita Situ, Magali Schmitz, MD (General Surgery) Glenis Langdon, MD as Referring Physician (Radiation Oncology) Prescilla Brod, Ninette Basque, MD as Consulting Physician (Vascular Surgery) Avonne Boettcher, MD as Consulting Physician (Hematology and Oncology)   Name of the patient: [name] [name]  [id number]  Jul 24, 1933   Date of visit: 10/11/23  Diagnosis-history of adjuvant right breast cancer in 2019 Osteoporosis left femur neck  Chief complaint/ Reason for visit-routine follow-up of breast cancer  Heme/Onc history: Patient is a 89 year old female who was diagnosed with stage I HER2 positiveRight breast cancer in 2019 s/p lumpectomy and sentinel lymph node biopsy.  Final pathology showed 0.6 cm grade 3 invasive mammary carcinoma ER 99% positive PR 25% positive and HER2 positive by FISH.  She received 12 weeks of Herceptin  and Taxol  followed by weekly Herceptin  for 1 year.  She also received adjuvant radiation treatment she is currently on tamoxifen  for hormone therapy which she started sometime in February 2020.  She completed 5 years of endocrine therapy and stopped tamoxifen  in March 2025   Also has a history of stage IIb ovarian cancer s/p exploratory laparotomy TAH/BSO omentectomy peritoneal biopsies and pelvic and periaortic lymph node sampling back in 2003.  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  She has also received adjuvant CarboTaxol for it back then.  Has baseline osteoporosis on bone density.  She gets Prolia  through Dr. Geralyn Knee    Interval history-patient had an episode of shingles about a month ago and is gradually  recovering from the same.  Denies any breast concerns today  ECOG PS- 2 Pain scale- 0   Review of systems- Review of Systems  Constitutional:  Negative for chills, fever, malaise/fatigue and weight loss.  HENT:  Negative for congestion, ear discharge and nosebleeds.   Eyes:  Negative for blurred vision.  Respiratory:  Negative for cough, hemoptysis, sputum production, shortness of breath and wheezing.   Cardiovascular:  Negative for chest pain, palpitations, orthopnea and claudication.  Gastrointestinal:  Negative for abdominal pain, blood in stool, constipation, diarrhea, heartburn, melena, nausea and vomiting.  Genitourinary:  Negative for dysuria, flank pain, frequency, hematuria and urgency.  Musculoskeletal:  Negative for back pain, joint pain and myalgias.  Skin:  Negative for rash.  Neurological:  Negative for dizziness, tingling, focal weakness, seizures, weakness and headaches.  Endo/Heme/Allergies:  Does not bruise/bleed easily.  Psychiatric/Behavioral:  Negative for depression and suicidal ideas. The patient does not have insomnia.       No Known Allergies   Past Medical History:  Diagnosis Date   Breast cancer (HCC)    Diverticulitis of sigmoid colon    Endometriosis    Family history of breast cancer    Family history of ovarian cancer    Family history of prostate cancer    Family history of uterine cancer    Fibrocystic breast disease    Glaucoma    H/O urinary incontinence    History of chicken pox    History of colon polyps 2014   Hyperlipidemia    Hypertension    Ovarian cancer (HCC) 2002   Chemo tx's @ [facility] with Total Hysterectomy. Dr Luster Salters   Personal history of chemotherapy    ovarian cancer  Personal history of ovarian cancer    Personal history of radiation therapy    Port-A-Cath in place    Sinusitis    Tuberculosis    positve skin test     Past Surgical History:  Procedure Laterality Date   ABDOMINAL HYSTERECTOMY  2003   complete Dr  Luster Salters   ABSCESS DRAINAGE  2017   right buttock at [facility]   APPENDECTOMY  2003   BREAST EXCISIONAL BIOPSY Left 1993   excisional bx. negative results   BREAST LUMPECTOMY Right 03/18/2018   done at [facility] breast ca rad and chemo Invasive ductal carcinoma with focal squamous differentiation, Nottingham grade 3, is identified forming a 6 mm mass, associated with high-grade ductal carcinoma in situ, radial scar and biopsy site changes.   BREAST SURGERY Left 1982   papilloma removal   COLONOSCOPY  2006, 2014   Dr Felicita Horns    COLONOSCOPY N/A 03/27/2016   Procedure: COLONOSCOPY;  Surgeon: Marshall Skeeter, MD;  Location: [facility] ENDOSCOPY;  Service: Endoscopy;  Laterality: N/A;   COLOSTOMY REVERSAL  2014   at [facility] Dr Dorman Gaskin   KNEE SURGERY Right 10/2022   knee replacement   NASAL SINUS SURGERY     For fungal infection   NASAL SINUS SURGERY  8/05 8/06   OOPHORECTOMY     OSTOMY  05/22/2012   diverticulitis with abcess/ Dr Felipe Horton   ovarian cancer     Exploratory lap   PORTA CATH INSERTION N/A 05/26/2018   Procedure: PORTA CATH INSERTION;  Surgeon: Celso College, MD;  Location: [facility] INVASIVE CV LAB;  Service: Cardiovascular;  Laterality: N/A;   PORTA CATH REMOVAL N/A 11/12/2019   Procedure: PORTA CATH REMOVAL;  Surgeon: Celso College, MD;  Location: [facility] INVASIVE CV LAB;  Service: Cardiovascular;  Laterality: N/A;   TONSILLECTOMY AND ADENOIDECTOMY  1947    Social History   Socioeconomic History   Marital status: Widowed    Spouse name: Not on file   Number of children: 0   Years of education: Not on file   Highest education level: Not on file  Occupational History   Not on file  Tobacco Use   Smoking status: Never   Smokeless tobacco: Never  Vaping Use   Vaping status: Never Used  Substance and Sexual Activity   Alcohol use: No    Alcohol/week: 0.0 standard drinks of alcohol   Drug use: Yes   Sexual activity: Not Currently  Other Topics Concern   Not on file  Social History  Narrative   Lives at home alone with dog   Social Drivers of Health   Financial Resource Strain: Low Risk  (01/01/2023)   Overall Financial Resource Strain (CARDIA)    Difficulty of Paying Living Expenses: Not hard at all  Food Insecurity: No Food Insecurity (01/01/2023)   Hunger Vital Sign    Worried About Running Out of Food in the Last Year: Never true    Ran Out of Food in the Last Year: Never true  Transportation Needs: No Transportation Needs (01/01/2023)   PRAPARE - Administrator, Civil Service (Medical): No    Lack of Transportation (Non-Medical): No  Physical Activity: Inactive (01/01/2023)   Exercise Vital Sign    Days of Exercise per Week: 0 days    Minutes of Exercise per Session: 0 min  Stress: No Stress Concern Present (01/01/2023)   [employer] of Occupational Health - Occupational Stress Questionnaire    Feeling of Stress :  Not at all  Social Connections: Moderately Integrated (01/01/2023)   Social Connection and Isolation Panel [NHANES]    Frequency of Communication with Friends and Family: More than three times a week    Frequency of Social Gatherings with Friends and Family: More than three times a week    Attends Religious Services: More than 4 times per year    Active Member of [employer] or Organizations: Yes    Attends Banker Meetings: More than 4 times per year    Marital Status: Widowed  Intimate Partner Violence: Not At Risk (01/01/2023)   Humiliation, Afraid, Rape, and Kick questionnaire    Fear of Current or Ex-Partner: No    Emotionally Abused: No    Physically Abused: No    Sexually Abused: No    Family History  Problem Relation Age of Onset   Hypertension Mother    Coronary artery disease Mother    Heart disease Mother    Kidney disease Mother    Diabetes Mother    Cancer Mother        uterine cancer dx 52s   Diabetes Father    Kidney failure Father    Stroke Sister    Heart disease Sister        Visual merchandiser    Dementia Sister    Diabetes Brother    Coronary artery disease Brother    Cancer Brother        bladder cancer   Cancer Brother        Prostate   Cancer Maternal Aunt 49       ovarian cancer   Breast cancer Cousin      Current Outpatient Medications:    acetaminophen  (TYLENOL ) 650 MG CR tablet, Take 650 mg by mouth every 8 (eight) hours as needed for pain., Disp: , Rfl:    amLODipine  (NORVASC ) 2.5 MG tablet, TAKE 1 TABLET BY MOUTH EVERY DAY, Disp: 90 tablet, Rfl: 1   aspirin 81 MG chewable tablet, Chew by mouth in the morning and at bedtime., Disp: , Rfl:    Calcium Carbonate-Vitamin D  600-400 MG-UNIT tablet, Take 1 tablet by mouth daily. , Disp: , Rfl:    cholecalciferol (VITAMIN D3) 25 MCG (1000 UNIT) tablet, Take 1,000 Units by mouth daily., Disp: , Rfl:    gabapentin  (NEURONTIN ) 100 MG capsule, Take 1 capsule (100 mg total) by mouth 3 (three) times daily., Disp: 90 capsule, Rfl: 2   meloxicam (MOBIC) 15 MG tablet, Take 15 mg by mouth daily., Disp: , Rfl:    Multiple Vitamins-Minerals (MULTIVITAMIN WITH MINERALS) tablet, Take 1 tablet by mouth daily., Disp: , Rfl:    tamoxifen  (NOLVADEX ) 20 MG tablet, Take 1 tablet (20 mg total) by mouth daily., Disp: 90 tablet, Rfl: 3  Current Facility-Administered Medications:    denosumab  (PROLIA ) injection 60 mg, 60 mg, Subcutaneous, Once, Dellar Fenton, MD  Facility-Administered Medications Ordered in Other Visits:    sodium chloride  0.9 % 50 mL with famotidine  (PEPCID ) 20 mg infusion, 20 mg, Intravenous, Once, Brahmanday, Govinda R, MD   sodium chloride  flush (NS) 0.9 % injection 10 mL, 10 mL, Intravenous, PRN, Brahmanday, Govinda R, MD, 10 mL at 06/24/18 0854  Physical exam:  Vitals:   10/11/23 1034  BP: (!) 146/63  Pulse: 63  Resp: 16  Temp: 97.9 F (36.6 C)  TempSrc: Tympanic  SpO2: 98%  Weight: 160 lb (72.6 kg)   Physical Exam Constitutional:      Comments: Ambulates with a cane.  Appears in no acute distress   Cardiovascular:     Rate and Rhythm: Normal rate and regular rhythm.     Heart sounds: Normal heart sounds.  Pulmonary:     Effort: Pulmonary effort is normal.     Breath sounds: Normal breath sounds.  Abdominal:     General: Bowel sounds are normal.     Palpations: Abdomen is soft.  Skin:    General: Skin is warm and dry.  Neurological:     Mental Status: She is alert and oriented to person, place, and time.    Breast exam was performed in seated and lying down position. Patient is status post right lumpectomy with a well-healed surgical scar. No evidence of any palpable masses. No evidence of axillary adenopathy. No evidence of any palpable masses or lumps in the left breast. No evidence of leftt axillary adenopathy   I have personally reviewed labs listed below:    Latest Ref Rng & Units 09/06/2023    9:21 AM  CMP  Glucose 70 - 99 mg/dL 85   BUN 6 - 23 mg/dL 14   Creatinine 1.61 - 1.20 mg/dL 0.96   Sodium 045 - 409 mEq/L 142   Potassium 3.5 - 5.1 mEq/L 3.8   Chloride 96 - 112 mEq/L 108   CO2 19 - 32 mEq/L 27   Calcium 8.4 - 10.5 mg/dL 9.0   Total Protein 6.0 - 8.3 g/dL 6.4   Total Bilirubin 0.2 - 1.2 mg/dL 0.4   Alkaline Phos 39 - 117 U/L 54   AST 0 - 37 U/L 17   ALT 0 - 35 U/L 13       Latest Ref Rng & Units 09/06/2023    9:21 AM  CBC  WBC 4.0 - 10.5 K/uL 7.3   Hemoglobin 12.0 - 15.0 g/dL 81.1   Hematocrit 91.4 - 46.0 % 39.4   Platelets 150.0 - 400.0 K/uL 251.0     Assessment and plan- Patient is a 89 y.o. female with history of stage I right breast cancer ER/PR positive HER2 negative status postlumpectomy and adjuvant radiation therapy and 5 years of endocrine therapy.  She is here for routine follow-up  Patient stopped taking tamoxifen  about a month ago after completing 5 years of endocrine therapy.  Clinically she is doing well with no concerning signs and symptoms of recurrence based on today's exam.  I will schedule her mammogram for September 2025 and see  her back in 1 year.  Patient has known osteoporosis of left femur neck which has worsened but that is likely age-related as tamoxifen  is pro estrogen on the bone and does not make osteoporosis worse.  She had completed 3 years of Prolia  but given her worsening osteoporosis noted on her bone density in October 2024 I had recommended that she should at least continue it for 1 more year.  Patient had also seen Dr. Lorelei Rogers from endocrinology for her osteoporosis as well   Visit Diagnosis 1. Encounter for follow-up surveillance of breast cancer   2. Age-related osteoporosis without current pathological fracture      Dr. Seretha Dance, MD, MPH [facility] [phone] 10/11/2023 10:43 AM

## 2023-10-14 ENCOUNTER — Encounter: Payer: Self-pay | Admitting: *Deleted

## 2023-10-18 ENCOUNTER — Ambulatory Visit (INDEPENDENT_AMBULATORY_CARE_PROVIDER_SITE_OTHER): Admitting: Internal Medicine

## 2023-10-18 ENCOUNTER — Encounter: Payer: Self-pay | Admitting: Internal Medicine

## 2023-10-18 VITALS — BP 126/64 | HR 62 | Ht 63.0 in | Wt 161.8 lb

## 2023-10-18 DIAGNOSIS — T451X5A Adverse effect of antineoplastic and immunosuppressive drugs, initial encounter: Secondary | ICD-10-CM

## 2023-10-18 DIAGNOSIS — F439 Reaction to severe stress, unspecified: Secondary | ICD-10-CM

## 2023-10-18 DIAGNOSIS — E7800 Pure hypercholesterolemia, unspecified: Secondary | ICD-10-CM | POA: Diagnosis not present

## 2023-10-18 DIAGNOSIS — I10 Essential (primary) hypertension: Secondary | ICD-10-CM | POA: Diagnosis not present

## 2023-10-18 DIAGNOSIS — Z860100 Personal history of colon polyps, unspecified: Secondary | ICD-10-CM

## 2023-10-18 DIAGNOSIS — R739 Hyperglycemia, unspecified: Secondary | ICD-10-CM | POA: Diagnosis not present

## 2023-10-18 DIAGNOSIS — M810 Age-related osteoporosis without current pathological fracture: Secondary | ICD-10-CM

## 2023-10-18 DIAGNOSIS — C50919 Malignant neoplasm of unspecified site of unspecified female breast: Secondary | ICD-10-CM

## 2023-10-18 DIAGNOSIS — Z8543 Personal history of malignant neoplasm of ovary: Secondary | ICD-10-CM

## 2023-10-18 DIAGNOSIS — D649 Anemia, unspecified: Secondary | ICD-10-CM

## 2023-10-18 DIAGNOSIS — B029 Zoster without complications: Secondary | ICD-10-CM

## 2023-10-18 DIAGNOSIS — M25561 Pain in right knee: Secondary | ICD-10-CM

## 2023-10-18 DIAGNOSIS — M25562 Pain in left knee: Secondary | ICD-10-CM

## 2023-10-18 DIAGNOSIS — G620 Drug-induced polyneuropathy: Secondary | ICD-10-CM

## 2023-10-18 MED ORDER — AMLODIPINE BESYLATE 2.5 MG PO TABS: 2.5000 mg | ORAL_TABLET | Freq: Every day | ORAL | 1 refills | Status: AC

## 2023-10-18 MED ORDER — DENOSUMAB 60 MG/ML ~~LOC~~ SOSY: 60.0000 mg | PREFILLED_SYRINGE | Freq: Once | SUBCUTANEOUS | Status: AC

## 2023-10-18 MED ORDER — GABAPENTIN 100 MG PO CAPS: 100.0000 mg | ORAL_CAPSULE | Freq: Three times a day (TID) | ORAL | 1 refills | Status: AC

## 2023-10-18 NOTE — Progress Notes (Unsigned)
 Subjective:    Patient ID: [name] [name], female    DOB: Dec 13, 1933, 89 y.o.   MRN: [id number]  Patient here for  Chief Complaint  Patient presents with   Medical Management of Chronic Issues    7 week follow up on shingles    HPI Here for a scheduled follow up. Had f/u with Dr Randy Buttery 10/11/23 - f/u regarding breast cancer s/p lumpectomy and adjuvant radiation therapy and 5 years of endocrine therapy. and history of ovarian cancer. Due mammogram 02/2024.  Saw endocrinology 09/30/23. Recommended to continue prolia  for another 1-2 years and then f/u with bone density. If continues to decline, recommended evenity.  Diagnosed with shingles 07/2023. Treated with valtrex . Persistent pain and started gabapentin  08/23/23. Gabapentin  is helping. Saw Dr Aubry Blase. Recommended knee replacement. Surgery not planned. Waiting for date. No chest pain or sob reported. No cough or congestion. No abdominal pain or bowel change reported. Also is planning cataract surgery in the future as well.    Past Medical History:  Diagnosis Date   Breast cancer (HCC)    Diverticulitis of sigmoid colon    Endometriosis    Family history of breast cancer    Family history of ovarian cancer    Family history of prostate cancer    Family history of uterine cancer    Fibrocystic breast disease    Glaucoma    H/O urinary incontinence    History of chicken pox    History of colon polyps 2014   Hyperlipidemia    Hypertension    Ovarian cancer (HCC) 2002   Chemo tx's @ [facility] with Total Hysterectomy. Dr Luster Salters   Personal history of chemotherapy    ovarian cancer   Personal history of ovarian cancer    Personal history of radiation therapy    Port-A-Cath in place    Sinusitis    Tuberculosis    positve skin test   Past Surgical History:  Procedure Laterality Date   ABDOMINAL HYSTERECTOMY  2003   complete Dr Luster Salters   ABSCESS DRAINAGE  2017   right buttock at [facility]   APPENDECTOMY  2003   BREAST EXCISIONAL BIOPSY Left 1993    excisional bx. negative results   BREAST LUMPECTOMY Right 03/18/2018   done at [facility] breast ca rad and chemo Invasive ductal carcinoma with focal squamous differentiation, Nottingham grade 3, is identified forming a 6 mm mass, associated with high-grade ductal carcinoma in situ, radial scar and biopsy site changes.   BREAST SURGERY Left 1982   papilloma removal   COLONOSCOPY  2006, 2014   Dr Felicita Horns    COLONOSCOPY N/A 03/27/2016   Procedure: COLONOSCOPY;  Surgeon: Marshall Skeeter, MD;  Location: [facility] ENDOSCOPY;  Service: Endoscopy;  Laterality: N/A;   COLOSTOMY REVERSAL  2014   at [facility] Dr Dorman Gaskin   KNEE SURGERY Right 10/2022   knee replacement   NASAL SINUS SURGERY     For fungal infection   NASAL SINUS SURGERY  8/05 8/06   OOPHORECTOMY     OSTOMY  05/22/2012   diverticulitis with abcess/ Dr Felipe Horton   ovarian cancer     Exploratory lap   PORTA CATH INSERTION N/A 05/26/2018   Procedure: PORTA CATH INSERTION;  Surgeon: Celso College, MD;  Location: [facility] INVASIVE CV LAB;  Service: Cardiovascular;  Laterality: N/A;   PORTA CATH REMOVAL N/A 11/12/2019   Procedure: PORTA CATH REMOVAL;  Surgeon: Celso College, MD;  Location: [facility] INVASIVE CV LAB;  Service:  Cardiovascular;  Laterality: N/A;   TONSILLECTOMY AND ADENOIDECTOMY  1947   Family History  Problem Relation Age of Onset   Hypertension Mother    Coronary artery disease Mother    Heart disease Mother    Kidney disease Mother    Diabetes Mother    Cancer Mother        uterine cancer dx 83s   Diabetes Father    Kidney failure Father    Stroke Sister    Heart disease Sister        Visual merchandiser   Dementia Sister    Diabetes Brother    Coronary artery disease Brother    Cancer Brother        bladder cancer   Cancer Brother        Prostate   Cancer Maternal Aunt 49       ovarian cancer   Breast cancer Cousin    Social History   Socioeconomic History   Marital status: Widowed    Spouse name: Not on file   Number of  children: 0   Years of education: Not on file   Highest education level: Not on file  Occupational History   Not on file  Tobacco Use   Smoking status: Never   Smokeless tobacco: Never  Vaping Use   Vaping status: Never Used  Substance and Sexual Activity   Alcohol use: No    Alcohol/week: 0.0 standard drinks of alcohol   Drug use: Yes   Sexual activity: Not Currently  Other Topics Concern   Not on file  Social History Narrative   Lives at home alone with dog   Social Drivers of Health   Financial Resource Strain: Low Risk  (01/01/2023)   Overall Financial Resource Strain (CARDIA)    Difficulty of Paying Living Expenses: Not hard at all  Food Insecurity: No Food Insecurity (01/01/2023)   Hunger Vital Sign    Worried About Running Out of Food in the Last Year: Never true    Ran Out of Food in the Last Year: Never true  Transportation Needs: No Transportation Needs (01/01/2023)   PRAPARE - Administrator, Civil Service (Medical): No    Lack of Transportation (Non-Medical): No  Physical Activity: Inactive (01/01/2023)   Exercise Vital Sign    Days of Exercise per Week: 0 days    Minutes of Exercise per Session: 0 min  Stress: No Stress Concern Present (01/01/2023)   [employer] of Occupational Health - Occupational Stress Questionnaire    Feeling of Stress : Not at all  Social Connections: Moderately Integrated (01/01/2023)   Social Connection and Isolation Panel [NHANES]    Frequency of Communication with Friends and Family: More than three times a week    Frequency of Social Gatherings with Friends and Family: More than three times a week    Attends Religious Services: More than 4 times per year    Active Member of [employer] or Organizations: Yes    Attends Banker Meetings: More than 4 times per year    Marital Status: Widowed     Review of Systems  Constitutional:  Negative for appetite change and unexpected weight change.  HENT:  Negative for  congestion and sinus pressure.   Respiratory:  Negative for cough, chest tightness and shortness of breath.   Cardiovascular:  Negative for chest pain, palpitations and leg swelling.  Gastrointestinal:  Negative for abdominal pain, diarrhea, nausea and vomiting.  Genitourinary:  Negative for  difficulty urinating and dysuria.  Musculoskeletal:  Negative for joint swelling and myalgias.  Skin:  Negative for color change and rash.  Neurological:  Negative for dizziness and headaches.  Psychiatric/Behavioral:  Negative for agitation and dysphoric mood.        Objective:     BP 126/64   Pulse 62   Ht 5\' 3"  (1.6 m)   Wt 161 lb 12.8 oz (73.4 kg)   SpO2 99%   BMI 28.66 kg/m  Wt Readings from Last 3 Encounters:  10/18/23 161 lb 12.8 oz (73.4 kg)  10/11/23 160 lb (72.6 kg)  08/23/23 167 lb 6.4 oz (75.9 kg)    Physical Exam Vitals reviewed.  Constitutional:      General: She is not in acute distress.    Appearance: Normal appearance.  HENT:     Head: Normocephalic and atraumatic.     Right Ear: External ear normal.     Left Ear: External ear normal.     Mouth/Throat:     Pharynx: No oropharyngeal exudate or posterior oropharyngeal erythema.  Eyes:     General: No scleral icterus.       Right eye: No discharge.        Left eye: No discharge.     Conjunctiva/sclera: Conjunctivae normal.  Neck:     Thyroid : No thyromegaly.  Cardiovascular:     Rate and Rhythm: Normal rate and regular rhythm.  Pulmonary:     Effort: No respiratory distress.     Breath sounds: Normal breath sounds. No wheezing.  Abdominal:     General: Bowel sounds are normal.     Palpations: Abdomen is soft.     Tenderness: There is no abdominal tenderness.  Musculoskeletal:        General: No swelling or tenderness.     Cervical back: Neck supple. No tenderness.  Lymphadenopathy:     Cervical: No cervical adenopathy.  Skin:    Findings: No erythema or rash.  Neurological:     Mental Status: She is  alert.  Psychiatric:        Mood and Affect: Mood normal.        Behavior: Behavior normal.         Outpatient Encounter Medications as of 10/18/2023  Medication Sig   acetaminophen  (TYLENOL ) 650 MG CR tablet Take 650 mg by mouth every 8 (eight) hours as needed for pain.   [DISCONTINUED] aspirin 81 MG chewable tablet Chew by mouth in the morning and at bedtime.   [DISCONTINUED] Calcium Carbonate-Vitamin D  600-400 MG-UNIT tablet Take 1 tablet by mouth daily.    [DISCONTINUED] cholecalciferol (VITAMIN D3) 25 MCG (1000 UNIT) tablet Take 1,000 Units by mouth daily.   [DISCONTINUED] Multiple Vitamins-Minerals (MULTIVITAMIN WITH MINERALS) tablet Take 1 tablet by mouth daily.   amLODipine  (NORVASC ) 2.5 MG tablet Take 1 tablet (2.5 mg total) by mouth daily.   gabapentin  (NEURONTIN ) 100 MG capsule Take 1 capsule (100 mg total) by mouth 3 (three) times daily.   [DISCONTINUED] amLODipine  (NORVASC ) 2.5 MG tablet TAKE 1 TABLET BY MOUTH EVERY DAY   [DISCONTINUED] gabapentin  (NEURONTIN ) 100 MG capsule Take 1 capsule (100 mg total) by mouth 3 (three) times daily.   [DISCONTINUED] meloxicam (MOBIC) 15 MG tablet Take 15 mg by mouth daily. (Patient not taking: Reported on 10/18/2023)   [DISCONTINUED] tamoxifen  (NOLVADEX ) 20 MG tablet Take 1 tablet (20 mg total) by mouth daily. (Patient not taking: Reported on 10/18/2023)   Facility-Administered Encounter Medications as of 10/18/2023  Medication   [  COMPLETED] denosumab  (PROLIA ) injection 60 mg   [START ON 04/20/2024] denosumab  (PROLIA ) injection 60 mg   sodium chloride  0.9 % 50 mL with famotidine  (PEPCID ) 20 mg infusion   sodium chloride  flush (NS) 0.9 % injection 10 mL     Lab Results  Component Value Date   WBC 7.3 09/06/2023   HGB 13.0 09/06/2023   HCT 39.4 09/06/2023   PLT 251.0 09/06/2023   GLUCOSE 85 09/06/2023   CHOL 141 09/06/2023   TRIG 137.0 09/06/2023   HDL 53.70 09/06/2023   LDLCALC 60 09/06/2023   ALT 13 09/06/2023   AST 17 09/06/2023    NA 142 09/06/2023   K 3.8 09/06/2023   CL 108 09/06/2023   CREATININE 0.57 09/06/2023   BUN 14 09/06/2023   CO2 27 09/06/2023   TSH 2.15 09/06/2023   INR 1.0 05/21/2012   HGBA1C 5.6 09/06/2023    DG Bone Density Result Date: 04/08/2023 EXAM: DUAL X-RAY ABSORPTIOMETRY (DXA) FOR BONE MINERAL DENSITY IMPRESSION: Your patient [name] [name] completed a BMD test on 04/08/2023 using the [employer] iDXA DXA System (software version: 14.10) manufactured by [employer]. The following summarizes the results of our evaluation. Technologist: SCE PATIENT BIOGRAPHICAL: Name: [name] [name], [name] [name] Patient ID: [id number] Birth Date: [birth year] Height: 63.0 in. Gender: Female Exam Date: 04/08/2023 Weight: 168.0 lbs. Indications: High Risk Meds, Height Loss, Oophorectomy Bilateral, Caucasian, History of Chemo, History of Breast Cancer, Postmenopausal, Hysterectomy, History of Radiation, History of Ovarian Cancer, Osteoarthritis, Advanced Age Fractures: Treatments: Multi-Vitamin, calcium w/ vit D, Tamoxifen , Prolia  DENSITOMETRY RESULTS: Site      Region     Measured Date Measured Age WHO Classification Young Adult T-score BMD         %Change vs. Previous Significant Change (*) AP Spine L1-L4 04/08/20[age].0 Osteopenia -1.6 0.982 g/cm2 - - DualFemur Total Left 04/08/2023 87.0 Osteoporosis -2.5 0.697 g/cm2 -11.8% Yes DualFemur Total Left 04/06/2021 85.0 Osteopenia -1.7 0.790 g/cm2 3.5% Yes DualFemur Total Left 04/06/2019 83.0 Osteopenia -1.9 0.763 g/cm2 -1.0% - DualFemur Total Left 04/01/2017 81.0 Osteopenia -1.9 0.771 g/cm2 - - DualFemur Total Mean 04/08/2023 87.0 Osteopenia -2.3 0.714 g/cm2 -8.2% Yes DualFemur Total Mean 04/06/2021 85.0 Osteopenia -1.8 0.778 g/cm2 3.2% Yes DualFemur Total Mean 04/06/2019 83.0 Osteopenia -2.0 0.754 g/cm2 -3.8% Yes DualFemur Total Mean 04/01/2017 81.0 Osteopenia -1.8 0.784 g/cm2 - - ASSESSMENT: The BMD measured at Femur Total Left is 0.697 g/cm2 with a T-score of -2.5. This patient is  considered osteoporotic according to World Health Organization [facility]) criteria. Compared with prior study, there has been a significant decrease in the total hip. The scan quality is good. World Science writer [facility]) criteria for post-menopausal, Caucasian Women: Normal:                   T-score at or above -1 SD Osteopenia/low bone mass: T-score between -1 and -2.5 SD Osteoporosis:             T-score at or below -2.5 SD RECOMMENDATIONS: 1. All patients should optimize calcium and vitamin D  intake. 2. Consider FDA-approved medical therapies in postmenopausal women and men aged 33 years and older, based on the following: a. A hip or vertebral(clinical or morphometric) fracture b. T-score < -2.5 at the femoral neck or spine after appropriate evaluation to exclude secondary causes c. Low bone mass (T-score between -1.0 and -2.5 at the femoral neck or spine) and a 10-year probability of a hip fracture > 3% or a 10-year probability of a major osteoporosis-related  fracture > 20% based on the [place] -adapted WHO algorithm 3. Clinician judgment and/or patient preferences may indicate treatment for people with 10-year fracture probabilities above or below these levels FOLLOW-UP: People with diagnosed cases of osteoporosis or at high risk for fracture should have regular bone mineral density tests. For patients eligible for Medicare, routine testing is allowed once every 2 years. The testing frequency can be increased to one year for patients who have rapidly progressing disease, those who are receiving or discontinuing medical therapy to restore bone mass, or have additional risk factors. I have reviewed this report, and agree with the above findings. [facility] Electronically Signed   By: Alinda Apley M.D.   On: 04/08/2023 10:13       Assessment & Plan:  Osteoporosis without current pathological fracture, unspecified osteoporosis type Assessment & Plan: Her recent bone density scan shows  worsening osteoporosis with T-score of -2.5 in her left femur as compared to 1.7 in 2022. Continue calcium and vitamin D  and weight bearing exercise. Continue prolia . Continue for 1-2 more years.  Repeat bone density. If continues to decline, recommend evenity.   Orders: -     Denosumab   Hypertension, essential Assessment & Plan: Continue amlodipine  2.5mg  q day. Follow pressures. Follow metabolic panel.   Orders: -     Basic metabolic panel with GFR; Future  Hypercholesterolemia Assessment & Plan: Follow lipid panel.   Orders: -     Hepatic function panel; Future -     Lipid panel; Future  Hyperglycemia Assessment & Plan: Follow met b and A1c.   Orders: -     Hemoglobin A1c; Future  Anemia, unspecified type Assessment & Plan: Followed by oncology.  They follow cbc. Recheck today - prior to surgery. Follow [facility].    Malignant neoplasm of female breast, unspecified estrogen receptor status, unspecified laterality, unspecified site of breast (HCC) Assessment & Plan: Stage I, ER/PR positive HER2 positive s/p lumpectomy and slnbx invasive mammary carcinoma of right breast.  Followed by oncology.  Completed tamoxifen .    Chemotherapy-induced neuropathy (HCC) Assessment & Plan: Documented - oncology. Stable.    History of colonic polyps Assessment & Plan: Colonoscopy 03/2016 - normal.     History of ovarian cancer Assessment & Plan: Previously followed by oncology.  Doing well.     Stress Assessment & Plan: Overall doing well.  Follow.    Herpes zoster without complication Assessment & Plan: Recently diagnosed with shingles.  Completed valtrex . Can continue tylenol . Continue gabapentin  100mg  tid. Stable.    Pain in both knees, unspecified chronicity Assessment & Plan: Saw ortho. Recommended total knee surgery. Discussed surgery. No chest pain or sob reported. Discussed pre op evaluation. Discussed w/up. She wants to hold on w/up until has exact appt date  scheduled.    Other orders -     amLODIPine  Besylate; Take 1 tablet (2.5 mg total) by mouth daily.  Dispense: 90 tablet; Refill: 1 -     Gabapentin ; Take 1 capsule (100 mg total) by mouth 3 (three) times daily.  Dispense: 90 capsule; Refill: 1     Dellar Fenton, MD

## 2023-10-20 ENCOUNTER — Encounter: Payer: Self-pay | Admitting: Internal Medicine

## 2023-10-20 NOTE — Assessment & Plan Note (Signed)
 Follow lipid panel.

## 2023-10-20 NOTE — Assessment & Plan Note (Signed)
Previously followed by oncology.  Doing well.

## 2023-10-20 NOTE — Assessment & Plan Note (Signed)
 Stage I, ER/PR positive HER2 positive s/p lumpectomy and slnbx invasive mammary carcinoma of right breast.  Followed by oncology.  Completed tamoxifen .

## 2023-10-20 NOTE — Assessment & Plan Note (Signed)
Overall doing well.  Follow.   

## 2023-10-20 NOTE — Assessment & Plan Note (Signed)
Continue amlodipine 2.5mg  q day.  Follow pressures.  Follow metabolic panel.

## 2023-10-20 NOTE — Assessment & Plan Note (Signed)
Colonoscopy 03/2016 - normal.   

## 2023-10-20 NOTE — Assessment & Plan Note (Signed)
 Saw ortho. Recommended total knee surgery. Discussed surgery. No chest pain or sob reported. Discussed pre op evaluation. Discussed w/up. She wants to hold on w/up until has exact appt date scheduled.

## 2023-10-20 NOTE — Assessment & Plan Note (Signed)
 Recently diagnosed with shingles.  Completed valtrex . Can continue tylenol . Continue gabapentin  100mg  tid. Stable.

## 2023-10-20 NOTE — Assessment & Plan Note (Signed)
Documented - oncology.  Stable.   

## 2023-10-20 NOTE — Assessment & Plan Note (Signed)
 Follow met b and A1c.

## 2023-10-20 NOTE — Assessment & Plan Note (Signed)
 Her recent bone density scan shows worsening osteoporosis with T-score of -2.5 in her left femur as compared to 1.7 in 2022. Continue calcium and vitamin D  and weight bearing exercise. Continue prolia . Continue for 1-2 more years.  Repeat bone density. If continues to decline, recommend evenity.

## 2023-10-20 NOTE — Assessment & Plan Note (Signed)
 Followed by oncology.  They follow [facility]. Recheck today - prior to surgery. Follow [facility].

## 2023-10-23 NOTE — Discharge Instructions (Addendum)
 Instructions after Total Knee Replacement   [name] P. Angie Fava., M.D.    Dept. of Orthopaedics & Sports Medicine [facility] [address] [place], [place]  [address]  Phone: [phone]   Fax: [phone]       [email or web address]       DIET: Drink plenty of non-alcoholic fluids. Resume your normal diet. Include foods high in fiber.  ACTIVITY:  You may use crutches or a walker with weight-bearing as tolerated, unless instructed otherwise. You may be weaned off of the walker or crutches by your Physical Therapist.  Do NOT place pillows under the knee. Anything placed under the knee could limit your ability to straighten the knee.   Use the Bone Foam 3 times a day for 30 minutes each session to help straighten the knee. Continue doing gentle exercises. Exercising will reduce the pain and swelling, increase motion, and prevent muscle weakness.   Please continue to use the TED compression stockings for 6 weeks. You may remove the stockings at night, but should reapply them in the morning. Do not drive or operate any equipment until instructed.  WOUND CARE:  The initial dressing (Aquacel) can remain in place for 7 days (see separate instructions). Continue to use the PolarCare or ice packs periodically to reduce pain and swelling. You may bathe or shower after the staples are removed at the first office visit following surgery.  MEDICATIONS: You may resume your regular medications. Please take the pain medication as prescribed on the medication. Do not take pain medication on an empty stomach. Unless instructed otherwise, you should take an enteric-coated aspirin 81 mg. TWICE a day. (This along with elevation will help reduce the possibility of blood clots/phlebitis in your operated leg.) Use a stool softener (such as Senokot-S or Colace) daily and a laxative (such as Miralax or Dulcolax) as needed to prevent constipation.  Do not drive or drink alcoholic beverages when  taking pain medications.  CALL THE OFFICE FOR: Temperature above 101 degrees Excessive bleeding or drainage on the dressing. Excessive swelling, coldness, or paleness of the toes. Persistent nausea and vomiting.  FOLLOW-UP:  You should have an appointment to return to the office in 10-14 days after surgery. Arrangements have been made for continuation of Physical Therapy (either home therapy or outpatient therapy).     [facility] Department Directory         [email or web address]       [email or web address]          Cardiology  Appointments: [place] - [phone]  Endocrinology  Appointments: [place] [phone] [place] - [phone]  Gastroenterology  Appointments: [place] [phone] [place] - [phone]        General Surgery   Appointments: [facility]  Internal Medicine/Family Medicine  Appointments: [facility] [place] - [phone] [place] - [phone]  Metabolic and Weigh Loss Surgery  Appointments: [facility]        Neurology  Appointments: [place] [phone] [place] - [phone]  Neurosurgery  Appointments: [place]  Obstetrics & Gynecology  Appointments: [place] [phone] [place] - [phone]        Pediatrics  Appointments: [place] [phone] [place] - [phone]  Physiatry  Appointments: [place] [phone]  Physical Therapy  Appointments: [place] - [phone]        Podiatry  Appointments: [place] [phone] [place] - [phone]  Pulmonology  Appointments: [place]  Rheumatology  Appointments: [place] [phone]  [place] Location: [facility]  [address] [place], [place]  [address]  [place] Location: [facility]. [address] [place], [place]  [address]  [place]  Location: [facility] [address] [place], [place]  [address]     [employer].  [address] , [place], [place], [address]. [phone] They will call you to arrange when they can come to see you

## 2023-10-23 NOTE — H&P (Signed)
 ORTHOPAEDIC HISTORY & PHYSICAL [name] [name], [name] [name]., MD - 10/17/2023 2:15 PM EDT Formatting of this note is different from the original. Images from the original note were not included. Chief Complaint: Chief Complaint Patient presents with Left Knee - Pain Discuss Left knee surgery  Reason for Visit: The patient is a 89 y.o. female who presents today with her niece [name] [name] [phone]) for reevaluation of her left knee. She has a long history of left knee pain. She localizes most of the pain along the medial aspect of the knee. She reports some swelling, no locking, and some giving way of the knee. The pain is aggravated by any weight bearing. The knee pain limits the patient's ability to ambulate long distances. The patient has not appreciated any significant improvement despite Tylenol , physical therapy, and activity modification. She is using a cane for ambulation. The patient states that the knee pain has progressed to the point that it is significantly interfering with her activities of daily living. She had been scheduled for left total knee arthroplasty in March but surgery was postponed due to shingles.  Of note, she is 7 months status post right total knee arthroplasty performed by Dr. Barba Bonnet. She states that the right knee still feels "stiff'.  Medications: Current Outpatient Medications Medication Sig Dispense Refill acetaminophen  (TYLENOL ) 650 MG ER tablet Take 650 mg by mouth every 8 (eight) hours as needed for Pain amLODIPine  (NORVASC ) 2.5 MG tablet Take 2.5 mg by mouth once daily calcium carbonate-vitamin D3 (CALTRATE 600+D) 600 mg(1,500mg ) -400 unit tablet Take 1 tablet by mouth once daily gabapentin  (NEURONTIN ) 100 MG capsule Take 100 mg by mouth 3 (three) times daily MULTIVITAMIN ORAL Take 1 tablet by mouth once daily as needed tamoxifen  (NOLVADEX ) 20 MG tablet Take 20 mg by mouth once daily FUROsemide (LASIX) 20 MG tablet Take 1 tablet (20 mg total) by mouth once  daily 12 tablet 0  No current facility-administered medications for this visit.  Allergies: No Known Allergies  Past Medical History: Past Medical History: Diagnosis Date Chicken pox Colon polyps Colostomy in place (CMS/HHS-HCC) 02/01/2013 Diverticulitis Endometriosis Fibrocystic breast disease Glaucoma Headache with anesthesia- however not last 2 procedures History of anesthesia reaction history of headaches with surgeries. no h/o csf leak per pt. Has not happened w/ recent procedures History of chemotherapy History of radiation therapy Hyperlipidemia Hypertension Malignant neoplasm of unspecified site of right female breast (CMS/HHS-HCC) 02/14/2018 02/14/2018: Right breast stereotactic core needle biopsy of calcifications at 11:00 (infinity clip placed/verified) - invasive ductal carcinoma, grade 3, ER+/PR+/HER2+ Ovarian cancer on right (CMS/HHS-HCC) 2003 TAH/BSO, chemotherapy - [facility] at [facility] by Dr. Comer Decamp Pelvic abscess in female Positive skin test for tuberculosis Primary osteoarthritis of right knee 04/10/2017 Sinusitis, unspecified  Past Surgical History: Past Surgical History: Procedure Laterality Date BREAST EXCISIONAL BIOPSY Left 05/1981 left breast excisional biopsy (Dr. Woodrow Hazy) - benign papilloma COLOSTOMY Left 05/2012 CLOSURE ENTEROSTOMY LARGE/SMALL INTESTINE N/A 03/19/2013 Procedure: CLOSURE ENTEROSTOMY LARGE/SMALL INTESTINE/HARTMAN REVERSAL; Surgeon: Earlean Glaze, MD; Location: [facility] OR; Service: General Surgery; Laterality: N/A; CYSTOURETHROSCOPY W/INSERTION/EXCHANGE URETERAL STENT Bilateral 03/19/2013 Procedure: Cystoscopy/Bilateral Lighted Ureteral Stents; Surgeon: Angeline Kemps, MD; [facility] OR; Service: Urology; Laterality: Bilateral; PERCUTANEOUS BIOPSY BREAST Right 02/14/2018 02/14/2018: Right breast stereotactic core needle biopsy of the calcifications located in the right breast at at 11 o'clock (infinity clip placed/verified) -  invasive ductal carcinoma, grade 3, ER+/PR+/HER2+ MASTECTOMY PARTIAL Right 03/18/2018 Procedure: MASTECTOMY, PARTIAL, SEED LOC; Surgeon: Karlyne Oxford, MD; Location: [facility] OR; Service: General Surgery; Laterality: Right;  BIOPSY/EXCISION LYMPH NODE AXILLARY Right 03/18/2018 Procedure: BIOPSY OR EXCISION OF LYMPH NODE(S); OPEN, DEEP AXILLARY NODE(S); Surgeon: Karlyne Oxford, MD; Location: [facility] OR; Service: General Surgery; Laterality: Right; Right total knee arthroplasty 10/25/2022 Dr. Hobart Lulas APPENDECTOMY FUNCTIONAL ENDOSCOPIC SINUS SURGERY Left twice- once for fungal infection- the 2nd one for drainage HYSTERECTOMY TOTAL ABDOMINAL W/REMOVAL TUBES &/OR OVARIES 2003 INCISION & DRAINAGE ABSCESS right buttock nasal sinus ostomy TONSILLECTOMY  Social History: Social History  Socioeconomic History Marital status: Widowed Number of children: 0 Years of education: 16 Highest education level: Bachelor's degree (e.g., BA, AB, BS) Occupational History Occupation: Retired Tobacco Use Smoking status: Never Smokeless tobacco: Never Vaping Use Vaping status: Never Used Substance and Sexual Activity Alcohol use: No Drug use: No Sexual activity: Not Currently Social History Narrative Preferred pronouns: She/Her/Hers Closest relative: Se[place]ron [place], [phone] Single (widowed), no children. 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 Religious preference: Friendship Exercise: walks at least a 1/2 mile twice daily with her dog Diet: "regular diet"; no dietary restrictions  Social Drivers of Catering manager Strain: Low Risk (01/01/2023) Received from [facility] Overall Financial Resource Strain (CARDIA) Difficulty of Paying Living Expenses: Not hard at all Food Insecurity: No Food Insecurity (01/01/2023) Received from [facility] Hunger Vital Sign Worried About Running Out of Food in the Last Year: Never true Ran Out of Food in the Last Year: Never true Transportation Needs: No Transportation Needs (01/01/2023) Received from [facility] - Transportation Lack of Transportation (Medical): No Lack of Transportation (Non-Medical): No Physical Activity: Inactive (01/01/2023) Received from [facility] Exercise Vital Sign Days of Exercise per Week: 0 days Minutes of Exercise per Session: 0 min Stress: No Stress Concern Present (01/01/2023) Received from [facility] of Occupational Health - Occupational Stress Questionnaire Feeling of Stress : Not at all Social Connections: Moderately Integrated (01/01/2023) Received from [facility] Social Connection and Isolation Panel [NHANES] Frequency of Communication with Friends and Family: More than three times a week Frequency of Social Gatherings with Friends and Family: More than three times a week Attends Religious Services: More than 4 times per year Active Member of [employer] or Organizations: Yes Attends Banker Meetings: More than 4 times per year Marital Status: Widowed Housing Stability: Unknown (09/30/2023) Housing Stability Vital Sign Homeless in the Last Year: No  Family History: Family History Problem Relation Name Age of Onset High blood pressure (Hypertension) Mother Heart disease Mother Kidney disease Mother Diabetes type II Mother Uterine cancer Mother dx in her 46s Myocardial Infarction (Heart attack) Mother 71 Ovarian cancer Maternal Aunt Breast cancer Cousin Diabetes type II Father Kidney disease Father Diabetes type II Brother Heart disease Brother Prostate cancer Brother Cancer Brother Bladder cancer Prostate cancer Brother Stroke Sister Heart disease Sister Stroke Sister Anesthesia problems Neg Hx  Review of Systems: A comprehensive 14 point ROS was performed, reviewed, and the pertinent  orthopaedic findings are documented in the HPI.  Exam BP 118/78  Ht 157.5 cm (5\' 2" )  Wt 73.9 kg (163 lb)  BMI 29.81 kg/m  General: Well-developed, well-nourished female seen in no acute distress. Antalgic gait. Varus thrust to the left knee. The previous shingles skin lesions to the right thoracic region have healed.  HEENT: Atraumatic, normocephalic. Pupils are equal and reactive to light. Extraocular motion is intact. Sclera are clear. Oropharynx is clear with moist mucosa.  Neck: Supple, nontender, and with good  ROM. No thyromegaly, adenopathy, JVD, or carotid bruits.  Lungs: Clear to auscultation bilaterally.  Cardiovascular: Regular rate and rhythm. Normal S1, S2. 2/6 murmur . No appreciable gallops or rubs. Peripheral pulses are palpable. No lower extremity edema. Homan`s test is negative.  Abdomen: Soft, nontender, nondistended. Bowel sounds are present.  Extremities: Good strength, stability, and range of motion of the upper extremities. Good range of motion of the hips and ankles.  Left Knee: Soft tissue swelling: mild Effusion: none Erythema: none Crepitance: mild Tenderness: medial Alignment: relative varus Mediolateral laxity: medial pseudolaxity Posterior sag: negative Patellar tracking: Good tracking without evidence of subluxation or tilt Atrophy: No significant atrophy. Quadriceps tone was good. Range of motion: 0/14/110 degrees  Right Knee: Soft tissue swelling: minimal Effusion: none Erythema: none Crepitance: none Tenderness: No focal tenderness Alignment: normal Atrophy: No significant atrophy. Quadriceps tone was good. Range of Motion: 0/6/106 degrees Hamstrings are tight.  Neurologic: Awake, alert, and oriented. Sensory function is intact to pinprick and light touch. Motor strength is judged to be 5/5. Motor coordination is within normal limits. No apparent clonus. No tremor.  X-rays: I reviewed the left knee radiographs that  were performed at [facility] on 05/31/2023. There is significant narrowing of the medial cartilage space with bone-on-bone articulation and associated varus alignment. Osteophyte formation is noted. Subchondral sclerosis is noted. No evidence of fracture or dislocation.  Impression: Degenerative arthrosis of the left knee Right total knee arthroplasty  Plan: The findings were discussed in detail with the patient. The patient was given informational material on total knee replacement. Conservative treatment options were reviewed with the patient. We discussed the risks and benefits of surgical intervention. The usual perioperative course was also discussed in detail. The patient expressed understanding of the risks and benefits of surgical intervention and would like to proceed with plans for left total knee arthroplasty.  She again was encouraged to work on quadriceps strengthening and hamstring stretching.  I spent a total of 45 minutes in both face-to-face and non-face-to-face activities, excluding procedures performed, for this visit on the date of this encounter.  MEDICAL CLEARANCE: Per anesthesiology. ACTIVITY: As tolerated. WORK STATUS: Not applicable. THERAPY: Preoperative physical therapy evaluation. MEDICATIONS: Requested Prescriptions  No prescriptions requested or ordered in this encounter  FOLLOW-UP: Return for preop History & Physical pending surgery date.  Darris Staiger P. Brynley Cuddeback, Jr., M.D.  This note was generated in part with voice recognition software and I apologize for any typographical errors that were not detected and corrected.  Electronically signed by Marilou Showman., MD at 10/18/2023 3:32 PM EDT

## 2023-10-25 ENCOUNTER — Other Ambulatory Visit: Payer: Self-pay

## 2023-10-25 ENCOUNTER — Encounter
Admission: RE | Admit: 2023-10-25 | Discharge: 2023-10-25 | Disposition: A | Discharge status: Home / Self Care | Source: Ambulatory Visit | Attending: Orthopedic Surgery | Admitting: Orthopedic Surgery

## 2023-10-25 VITALS — BP 155/62 | HR 79 | Resp 16 | Ht 63.0 in | Wt 160.0 lb

## 2023-10-25 DIAGNOSIS — M1712 Unilateral primary osteoarthritis, left knee: Secondary | ICD-10-CM | POA: Insufficient documentation

## 2023-10-25 DIAGNOSIS — Z01818 Encounter for other preprocedural examination: Secondary | ICD-10-CM | POA: Insufficient documentation

## 2023-10-25 DIAGNOSIS — Z01810 Encounter for preprocedural cardiovascular examination: Secondary | ICD-10-CM | POA: Diagnosis not present

## 2023-10-25 DIAGNOSIS — Z01812 Encounter for preprocedural laboratory examination: Secondary | ICD-10-CM

## 2023-10-25 LAB — URINALYSIS, COMPLETE (UACMP) WITH MICROSCOPIC
Bilirubin Urine: NEGATIVE
Glucose, UA: NEGATIVE mg/dL
Hgb urine dipstick: NEGATIVE
Ketones, ur: 5 mg/dL — AB
Leukocytes,Ua: NEGATIVE
Nitrite: NEGATIVE
Protein, ur: NEGATIVE mg/dL
Specific Gravity, Urine: 1.018 (ref 1.005–1.030)
pH: 5 (ref 5.0–8.0)

## 2023-10-25 LAB — SURGICAL PCR SCREEN
MRSA, PCR: NEGATIVE
Staphylococcus aureus: NEGATIVE

## 2023-10-25 LAB — C-REACTIVE PROTEIN: CRP: 0.5 mg/dL (ref <1.0)

## 2023-10-25 LAB — SEDIMENTATION RATE: Sed Rate: 7 mm/h (ref 0–30)

## 2023-10-25 NOTE — Patient Instructions (Addendum)
 Your procedure is scheduled on: Monday 10/28/23. Arrival time is 10:00 am in the [facility]    Report to the Registration Desk on the 1st floor of the [facility]. Free Valet parking is available.  If your arrival time is 6:00 am, do not arrive before that time as the [facility] entrance doors do not open until 6:00 am.  REMEMBER: Instructions that are not followed completely may result in serious medical risk, up to and including death; or upon the discretion of your surgeon and anesthesiologist your surgery may need to be rescheduled.  Do not eat food after midnight the night before surgery.  No gum chewing or hard candies.  You may however, drink CLEAR liquids up to 2 hours before you are scheduled to arrive for your surgery. Do not drink anything within 2 hours of your scheduled arrival time.  Clear liquids include: - water  - apple juice without pulp - gatorade (not RED colors) - black coffee or tea (Do NOT add milk or creamers to the coffee or tea) Do NOT drink anything that is not on this list.  In addition, your doctor has ordered for you to drink the provided:  Ensure Pre-Surgery Clear Carbohydrate Drink  Drinking this carbohydrate drink up to two hours before surgery helps to reduce insulin resistance and improve patient outcomes. Please complete drinking 2 hours before scheduled arrival time.  One week prior to surgery: Stop Anti-inflammatories (NSAIDS) such as Advil, Aleve, Ibuprofen, Motrin, Naproxen, Naprosyn and Aspirin based products such as Excedrin, Goody's Powder, BC Powder. You may however, continue to take Tylenol  if needed for pain up until the day of surgery.  Stop ALL OVER THE COUNTER supplements and vitamins today until after surgery.  Continue taking all prescribed medications as usual.   TAKE ONLY THESE MEDICATIONS THE MORNING OF SURGERY WITH A SIP OF WATER:  amLODipine  (NORVASC ) 2.5 MG tablet   No Alcohol for 24 hours before or after surgery.  No  Smoking including e-cigarettes for 24 hours before surgery.  No chewable tobacco products for at least 6 hours before surgery.  No nicotine patches on the day of surgery.  Do not use any "recreational" drugs for at least a week (preferably 2 weeks) before your surgery.  Please be advised that the combination of cocaine and anesthesia may have negative outcomes, up to and including death. If you test positive for cocaine, your surgery will be cancelled.  On the morning of surgery brush your teeth with toothpaste and water, you may rinse your mouth with mouthwash if you wish. Do not swallow any toothpaste or mouthwash.  Use CHG Soap or wipes as directed on instruction sheet.  Do not wear lotions, powders, or perfumes on the day of surgery.   Do not shave body hair from the neck down 48 hours before surgery.  Wear comfortable clothing (specific to your surgery type) to the hospital.  Do not wear jewelry, make-up, hairpins, clips or toenail polish.  For welded (permanent) jewelry: bracelets, anklets, waist bands, etc.  Please have this removed prior to surgery.  If it is not removed, there is a chance that hospital personnel will need to cut it off on the day of surgery. Contact lenses, hearing aids and dentures may not be worn into surgery.   Do not bring valuables to the hospital. [facility] is not responsible for any missing/lost belongings or valuables.   Notify your doctor if there is any change in your medical condition (cold, fever,  infection).  If you are being discharged the day of surgery, you will not be allowed to drive home. You will need a responsible individual to drive you home and stay with you for 24 hours after surgery.   If you are taking public transportation, you will need to have a responsible individual with you.  If you are being admitted to the hospital overnight, leave your suitcase in the car. After surgery it may be brought to your room.  In case of  increased patient census, it may be necessary for you, the patient, to continue your postoperative care in the Same Day Surgery department.  After surgery, you can help prevent lung complications by doing breathing exercises.  Take deep breaths and cough every 1-2 hours. Your doctor may order a device called an Incentive Spirometer to help you take deep breaths. When coughing or sneezing, hold a pillow firmly against your incision with both hands. This is called "splinting." Doing this helps protect your incision. It also decreases belly discomfort.  Surgery Visitation Policy:  Patients undergoing a surgery or procedure may have two family members or support persons with them as long as the person is not COVID-19 positive or experiencing its symptoms.   Please call the Pre-admissions Testing Dept. at [phone] if you have any questions about these instructions.    Pre-operative 5 CHG Bath Instructions   You can play a key role in reducing the risk of infection after surgery. Your skin needs to be as free of germs as possible. You can reduce the number of germs on your skin by washing with CHG (chlorhexidine gluconate) soap before surgery. CHG is an antiseptic soap that kills germs and continues to kill germs even after washing.   DO NOT use if you have an allergy to chlorhexidine/CHG or antibacterial soaps. If your skin becomes reddened or irritated, stop using the CHG and notify one of our RNs at [phone].   Please shower with the CHG soap starting 2 days before surgery using the following schedule: (PAT 10/25/23)  Saturday 10/26/23 - Monday 10/28/23    Please keep in mind the following:  DO NOT shave, including legs and underarms, starting the day of your first shower.   You may shave your face at any point before/day of surgery.  Place clean sheets on your bed the day you start using CHG soap. Use a clean washcloth (not used since being washed) for each shower. DO NOT sleep  with pets once you start using the CHG.   CHG Shower Instructions:  If you choose to wash your hair and private area, wash first with your normal shampoo/soap.  After you use shampoo/soap, rinse your hair and body thoroughly to remove shampoo/soap residue.  Turn the water OFF and apply about 3 tablespoons (45 ml) of CHG soap to a CLEAN washcloth.  Apply CHG soap ONLY FROM YOUR NECK DOWN TO YOUR TOES (washing for 3-5 minutes)  DO NOT use CHG soap on face, private areas, open wounds, or sores.  Pay special attention to the area where your surgery is being performed.  If you are having back surgery, having someone wash your back for you may be helpful. Wait 2 minutes after CHG soap is applied, then you may rinse off the CHG soap.  Pat dry with a clean towel  Put on clean clothes/pajamas   If you choose to wear lotion, please use ONLY the CHG-compatible lotions on the back of this paper.  Additional instructions for the day of surgery: DO NOT APPLY any lotions, deodorants, cologne, or perfumes.   Put on clean/comfortable clothes.  Brush your teeth.  Ask your nurse before applying any prescription medications to the skin.      CHG Compatible Lotions   Aveeno Moisturizing lotion  Cetaphil Moisturizing Cream  Cetaphil Moisturizing Lotion  Clairol Herbal Essence Moisturizing Lotion, Dry Skin  Clairol Herbal Essence Moisturizing Lotion, Extra Dry Skin  Clairol Herbal Essence Moisturizing Lotion, Normal Skin  Curel Age Defying Therapeutic Moisturizing Lotion with Alpha Hydroxy  Curel Extreme Care Body Lotion  Curel Soothing Hands Moisturizing Hand Lotion  Curel Therapeutic Moisturizing Cream, Fragrance-Free  Curel Therapeutic Moisturizing Lotion, Fragrance-Free  Curel Therapeutic Moisturizing Lotion, Original Formula  Eucerin Daily Replenishing Lotion  Eucerin Dry Skin Therapy Plus Alpha Hydroxy Crme  Eucerin Dry Skin Therapy Plus Alpha Hydroxy Lotion  Eucerin Original Crme   Eucerin Original Lotion  Eucerin Plus Crme Eucerin Plus Lotion  Eucerin TriLipid Replenishing Lotion  Keri Anti-Bacterial Hand Lotion  Keri Deep Conditioning Original Lotion Dry Skin Formula Softly Scented  Keri Deep Conditioning Original Lotion, Fragrance Free Sensitive Skin Formula  Keri Lotion Fast Absorbing Fragrance Free Sensitive Skin Formula  Keri Lotion Fast Absorbing Softly Scented Dry Skin Formula  Keri Original Lotion  Keri Skin Renewal Lotion Keri Silky Smooth Lotion  Keri Silky Smooth Sensitive Skin Lotion  Nivea Body Creamy Conditioning Oil  Nivea Body Extra Enriched Lotion  Nivea Body Original Lotion  Nivea Body Sheer Moisturizing Lotion Nivea Crme  Nivea Skin Firming Lotion  NutraDerm 30 Skin Lotion  NutraDerm Skin Lotion  NutraDerm Therapeutic Skin Cream  NutraDerm Therapeutic Skin Lotion  ProShield Protective Hand Cream  Provon moisturizing lotion  How to Use an Incentive Spirometer  An incentive spirometer is a tool that measures how well you are filling your lungs with each breath. Learning to take long, deep breaths using this tool can help you keep your lungs clear and active. This may help to reverse or lessen your chance of developing breathing (pulmonary) problems, especially infection. You may be asked to use a spirometer: After a surgery. If you have a lung problem or a history of smoking. After a long period of time when you have been unable to move or be active. If the spirometer includes an indicator to show the highest number that you have reached, your health care provider or respiratory therapist will help you set a goal. Keep a log of your progress as told by your health care provider. What are the risks? Breathing too quickly may cause dizziness or cause you to pass out. Take your time so you do not get dizzy or light-headed. If you are in pain, you may need to take pain medicine before doing incentive spirometry. It is harder to take a deep breath  if you are having pain. How to use your incentive spirometer  Sit up on the edge of your bed or on a chair. Hold the incentive spirometer so that it is in an upright position. Before you use the spirometer, breathe out normally. Place the mouthpiece in your mouth. Make sure your lips are closed tightly around it. Breathe in slowly and as deeply as you can through your mouth, causing the piston or the ball to rise toward the top of the chamber. Hold your breath for 3-5 seconds, or for as long as possible. If the spirometer includes a coach indicator, use this to guide you in breathing. Slow down your  breathing if the indicator goes above the marked areas. Remove the mouthpiece from your mouth and breathe out normally. The piston or ball will return to the bottom of the chamber. Rest for a few seconds, then repeat the steps 10 or more times. Take your time and take a few normal breaths between deep breaths so that you do not get dizzy or light-headed. Do this every 1-2 hours when you are awake. If the spirometer includes a goal marker to show the highest number you have reached (best effort), use this as a goal to work toward during each repetition. After each set of 10 deep breaths, cough a few times. This will help to make sure that your lungs are clear. If you have an incision on your chest or abdomen from surgery, place a pillow or a rolled-up towel firmly against the incision when you cough. This can help to reduce pain while taking deep breaths and coughing. General tips When you are able to get out of bed: Walk around often. Continue to take deep breaths and cough in order to clear your lungs. Keep using the incentive spirometer until your health care provider says it is okay to stop using it. If you have been in the hospital, you may be told to keep using the spirometer at home. Contact a health care provider if: You are having difficulty using the spirometer. You have trouble using the  spirometer as often as instructed. Your pain medicine is not giving enough relief for you to use the spirometer as told. You have a fever. Get help right away if: You develop shortness of breath. You develop a cough with bloody mucus from the lungs. You have fluid or blood coming from an incision site after you cough. Summary An incentive spirometer is a tool that can help you learn to take long, deep breaths to keep your lungs clear and active. You may be asked to use a spirometer after a surgery, if you have a lung problem or a history of smoking, or if you have been inactive for a long period of time. Use your incentive spirometer as instructed every 1-2 hours while you are awake. If you have an incision on your chest or abdomen, place a pillow or a rolled-up towel firmly against your incision when you cough. This will help to reduce pain. Get help right away if you have shortness of breath, you cough up bloody mucus, or blood comes from your incision when you cough. This information is not intended to replace advice given to you by your health care provider. Make sure you discuss any questions you have with your health care provider. Document Revised: 08/17/2019 Document Reviewed: 08/17/2019 Elsevier Patient Education  2023 [employer].

## 2023-10-27 ENCOUNTER — Encounter: Payer: Self-pay | Admitting: Orthopedic Surgery

## 2023-10-27 DIAGNOSIS — C569 Malignant neoplasm of unspecified ovary: Secondary | ICD-10-CM | POA: Insufficient documentation

## 2023-10-28 ENCOUNTER — Observation Stay
Admission: RE | Admit: 2023-10-28 | Discharge: 2023-10-29 | Disposition: A | Discharge status: Home / Self Care | Attending: Orthopedic Surgery | Admitting: Orthopedic Surgery

## 2023-10-28 ENCOUNTER — Ambulatory Visit: Payer: Self-pay | Admitting: Anesthesiology

## 2023-10-28 ENCOUNTER — Other Ambulatory Visit: Payer: Self-pay

## 2023-10-28 ENCOUNTER — Ambulatory Visit: Payer: Self-pay | Admitting: Urgent Care

## 2023-10-28 ENCOUNTER — Encounter
Admission: RE | Disposition: A | Discharge status: Home / Self Care | Payer: Self-pay | Source: Home / Self Care | Attending: Orthopedic Surgery

## 2023-10-28 ENCOUNTER — Ambulatory Visit

## 2023-10-28 ENCOUNTER — Encounter: Payer: Self-pay | Admitting: Orthopedic Surgery

## 2023-10-28 DIAGNOSIS — I10 Essential (primary) hypertension: Secondary | ICD-10-CM | POA: Insufficient documentation

## 2023-10-28 DIAGNOSIS — Z8543 Personal history of malignant neoplasm of ovary: Secondary | ICD-10-CM | POA: Diagnosis not present

## 2023-10-28 DIAGNOSIS — Z853 Personal history of malignant neoplasm of breast: Secondary | ICD-10-CM | POA: Insufficient documentation

## 2023-10-28 DIAGNOSIS — Z79899 Other long term (current) drug therapy: Secondary | ICD-10-CM | POA: Insufficient documentation

## 2023-10-28 DIAGNOSIS — M1712 Unilateral primary osteoarthritis, left knee: Principal | ICD-10-CM | POA: Insufficient documentation

## 2023-10-28 DIAGNOSIS — Z96652 Presence of left artificial knee joint: Secondary | ICD-10-CM

## 2023-10-28 HISTORY — PX: KNEE ARTHROPLASTY: SHX992

## 2023-10-28 SURGERY — ARTHROPLASTY, KNEE, TOTAL, USING IMAGELESS COMPUTER-ASSISTED NAVIGATION
Anesthesia: General | Site: Knee | Laterality: Left

## 2023-10-28 MED ORDER — ACETAMINOPHEN 10 MG/ML IV SOLN
1000.0000 mg | Freq: Four times a day (QID) | INTRAVENOUS | Status: DC
  Administered 2023-10-28 – 2023-10-29 (×3): 1000 mg via INTRAVENOUS
  Filled 2023-10-28 (×3): qty 100

## 2023-10-28 MED ORDER — DROPERIDOL 2.5 MG/ML IJ SOLN: 0.6250 mg | Freq: Once | INTRAMUSCULAR | Status: DC | PRN

## 2023-10-28 MED ORDER — BISACODYL 10 MG RE SUPP: 10.0000 mg | Freq: Every day | RECTAL | Status: DC | PRN

## 2023-10-28 MED ORDER — CEFAZOLIN SODIUM-DEXTROSE 2-4 GM/100ML-% IV SOLN
INTRAVENOUS | Status: AC
  Filled 2023-10-28: qty 100

## 2023-10-28 MED ORDER — PHENYLEPHRINE 80 MCG/ML (10ML) SYRINGE FOR IV PUSH (FOR BLOOD PRESSURE SUPPORT)
PREFILLED_SYRINGE | INTRAVENOUS | Status: AC
  Filled 2023-10-28: qty 10

## 2023-10-28 MED ORDER — CELECOXIB 200 MG PO CAPS
400.0000 mg | ORAL_CAPSULE | Freq: Once | ORAL | Status: AC
  Administered 2023-10-28: 400 mg via ORAL

## 2023-10-28 MED ORDER — FLEET ENEMA RE ENEM: 1.0000 | ENEMA | Freq: Once | RECTAL | Status: DC | PRN

## 2023-10-28 MED ORDER — SODIUM CHLORIDE 0.9 % IV SOLN
INTRAVENOUS | Status: DC | PRN
  Administered 2023-10-28: 60 mL

## 2023-10-28 MED ORDER — DEXAMETHASONE SODIUM PHOSPHATE 10 MG/ML IJ SOLN
INTRAMUSCULAR | Status: AC
  Filled 2023-10-28: qty 1

## 2023-10-28 MED ORDER — ENSURE PRE-SURGERY PO LIQD
296.0000 mL | Freq: Once | ORAL | Status: AC
  Administered 2023-10-28: 296 mL via ORAL
  Filled 2023-10-28: qty 296

## 2023-10-28 MED ORDER — MAGNESIUM HYDROXIDE 400 MG/5ML PO SUSP
30.0000 mL | Freq: Every day | ORAL | Status: DC
  Filled 2023-10-28: qty 30

## 2023-10-28 MED ORDER — MENTHOL 3 MG MT LOZG: 1.0000 | LOZENGE | OROMUCOSAL | Status: DC | PRN

## 2023-10-28 MED ORDER — FENTANYL CITRATE (PF) 100 MCG/2ML IJ SOLN: 25.0000 ug | INTRAMUSCULAR | Status: DC | PRN

## 2023-10-28 MED ORDER — TRANEXAMIC ACID-NACL 1000-0.7 MG/100ML-% IV SOLN
1000.0000 mg | Freq: Once | INTRAVENOUS | Status: AC
  Administered 2023-10-28: 1000 mg via INTRAVENOUS

## 2023-10-28 MED ORDER — LACTATED RINGERS IV SOLN: INTRAVENOUS | Status: DC

## 2023-10-28 MED ORDER — TRANEXAMIC ACID-NACL 1000-0.7 MG/100ML-% IV SOLN
INTRAVENOUS | Status: AC
  Filled 2023-10-28: qty 100

## 2023-10-28 MED ORDER — SODIUM CHLORIDE 0.9 % IR SOLN
Status: DC | PRN
  Administered 2023-10-28: 3000 mL

## 2023-10-28 MED ORDER — FENTANYL CITRATE (PF) 100 MCG/2ML IJ SOLN
INTRAMUSCULAR | Status: DC | PRN
Start: 2023-10-28 — End: 2023-10-28
  Administered 2023-10-28: 50 ug via INTRAVENOUS

## 2023-10-28 MED ORDER — CELECOXIB 200 MG PO CAPS
ORAL_CAPSULE | ORAL | Status: AC
Start: 2023-10-28 — End: ?
  Filled 2023-10-28: qty 2

## 2023-10-28 MED ORDER — GABAPENTIN 100 MG PO CAPS
100.0000 mg | ORAL_CAPSULE | Freq: Three times a day (TID) | ORAL | Status: DC
  Administered 2023-10-28 – 2023-10-29 (×3): 100 mg via ORAL
  Filled 2023-10-28 (×3): qty 1

## 2023-10-28 MED ORDER — GABAPENTIN 300 MG PO CAPS
ORAL_CAPSULE | ORAL | Status: AC
  Filled 2023-10-28: qty 1

## 2023-10-28 MED ORDER — EPHEDRINE 5 MG/ML INJ
INTRAVENOUS | Status: AC
  Filled 2023-10-28: qty 5

## 2023-10-28 MED ORDER — CEFAZOLIN SODIUM-DEXTROSE 2-4 GM/100ML-% IV SOLN
2.0000 g | INTRAVENOUS | Status: AC
  Administered 2023-10-28: 2 g via INTRAVENOUS

## 2023-10-28 MED ORDER — TRAMADOL HCL 50 MG PO TABS
50.0000 mg | ORAL_TABLET | ORAL | Status: DC | PRN
  Administered 2023-10-28: 50 mg via ORAL
  Filled 2023-10-28: qty 1

## 2023-10-28 MED ORDER — METOCLOPRAMIDE HCL 10 MG PO TABS
10.0000 mg | ORAL_TABLET | Freq: Three times a day (TID) | ORAL | Status: DC
  Administered 2023-10-28 – 2023-10-29 (×3): 10 mg via ORAL
  Filled 2023-10-28 (×3): qty 1

## 2023-10-28 MED ORDER — ACETAMINOPHEN 10 MG/ML IV SOLN
INTRAVENOUS | Status: DC | PRN
  Administered 2023-10-28: 1000 mg via INTRAVENOUS

## 2023-10-28 MED ORDER — PHENOL 1.4 % MT LIQD: 1.0000 | OROMUCOSAL | Status: DC | PRN

## 2023-10-28 MED ORDER — AMLODIPINE BESYLATE 5 MG PO TABS
ORAL_TABLET | ORAL | Status: AC
Start: 2023-10-28 — End: ?
  Filled 2023-10-28: qty 1

## 2023-10-28 MED ORDER — CELECOXIB 200 MG PO CAPS
200.0000 mg | ORAL_CAPSULE | Freq: Two times a day (BID) | ORAL | Status: DC
  Administered 2023-10-28 – 2023-10-29 (×2): 200 mg via ORAL
  Filled 2023-10-28 (×2): qty 1

## 2023-10-28 MED ORDER — MIDAZOLAM HCL 2 MG/2ML IJ SOLN
INTRAMUSCULAR | Status: AC
  Filled 2023-10-28: qty 2

## 2023-10-28 MED ORDER — BUPIVACAINE HCL (PF) 0.5 % IJ SOLN
INTRAMUSCULAR | Status: AC
  Filled 2023-10-28: qty 10

## 2023-10-28 MED ORDER — ONDANSETRON HCL 4 MG PO TABS: 4.0000 mg | ORAL_TABLET | Freq: Four times a day (QID) | ORAL | Status: DC | PRN

## 2023-10-28 MED ORDER — CEFAZOLIN SODIUM-DEXTROSE 2-4 GM/100ML-% IV SOLN
2.0000 g | Freq: Four times a day (QID) | INTRAVENOUS | Status: AC
  Administered 2023-10-28 – 2023-10-29 (×2): 2 g via INTRAVENOUS
  Filled 2023-10-28 (×2): qty 100

## 2023-10-28 MED ORDER — ACETAMINOPHEN 10 MG/ML IV SOLN: 1000.0000 mg | Freq: Once | INTRAVENOUS | Status: DC | PRN

## 2023-10-28 MED ORDER — DIPHENHYDRAMINE HCL 12.5 MG/5ML PO ELIX: 12.5000 mg | ORAL_SOLUTION | ORAL | Status: DC | PRN

## 2023-10-28 MED ORDER — OXYCODONE HCL 5 MG/5ML PO SOLN: 5.0000 mg | Freq: Once | ORAL | Status: DC | PRN

## 2023-10-28 MED ORDER — PROPOFOL 500 MG/50ML IV EMUL
INTRAVENOUS | Status: DC | PRN
  Administered 2023-10-28: 75 ug/kg/min via INTRAVENOUS

## 2023-10-28 MED ORDER — ONDANSETRON HCL 4 MG/2ML IJ SOLN: 4.0000 mg | Freq: Four times a day (QID) | INTRAMUSCULAR | Status: DC | PRN

## 2023-10-28 MED ORDER — AMLODIPINE BESYLATE 5 MG PO TABS
2.5000 mg | ORAL_TABLET | Freq: Every day | ORAL | Status: DC
  Administered 2023-10-28 – 2023-10-29 (×2): 2.5 mg via ORAL

## 2023-10-28 MED ORDER — ASPIRIN 81 MG PO CHEW
81.0000 mg | CHEWABLE_TABLET | Freq: Two times a day (BID) | ORAL | Status: DC
  Administered 2023-10-28 – 2023-10-29 (×2): 81 mg via ORAL
  Filled 2023-10-28 (×2): qty 1

## 2023-10-28 MED ORDER — FENTANYL CITRATE (PF) 100 MCG/2ML IJ SOLN
INTRAMUSCULAR | Status: AC
  Filled 2023-10-28: qty 2

## 2023-10-28 MED ORDER — FERROUS SULFATE 325 (65 FE) MG PO TABS
325.0000 mg | ORAL_TABLET | Freq: Two times a day (BID) | ORAL | Status: DC
  Administered 2023-10-28 – 2023-10-29 (×2): 325 mg via ORAL
  Filled 2023-10-28 (×2): qty 1

## 2023-10-28 MED ORDER — SURGIPHOR WOUND IRRIGATION SYSTEM - OPTIME
TOPICAL | Status: DC | PRN
  Administered 2023-10-28: 450 mL

## 2023-10-28 MED ORDER — CHLORHEXIDINE GLUCONATE 0.12 % MT SOLN
15.0000 mL | Freq: Once | OROMUCOSAL | Status: AC
  Administered 2023-10-28: 15 mL via OROMUCOSAL

## 2023-10-28 MED ORDER — SODIUM CHLORIDE 0.9 % IV SOLN: INTRAVENOUS | Status: DC

## 2023-10-28 MED ORDER — CHLORHEXIDINE GLUCONATE 0.12 % MT SOLN
OROMUCOSAL | Status: AC
  Filled 2023-10-28: qty 15

## 2023-10-28 MED ORDER — SENNOSIDES-DOCUSATE SODIUM 8.6-50 MG PO TABS
1.0000 | ORAL_TABLET | Freq: Two times a day (BID) | ORAL | Status: DC
  Administered 2023-10-28 – 2023-10-29 (×2): 1 via ORAL
  Filled 2023-10-28 (×2): qty 1

## 2023-10-28 MED ORDER — ORAL CARE MOUTH RINSE: 15.0000 mL | Freq: Once | OROMUCOSAL | Status: AC

## 2023-10-28 MED ORDER — DEXAMETHASONE SODIUM PHOSPHATE 10 MG/ML IJ SOLN
8.0000 mg | Freq: Once | INTRAMUSCULAR | Status: AC
  Administered 2023-10-28: 8 mg via INTRAVENOUS

## 2023-10-28 MED ORDER — OXYCODONE HCL 5 MG PO TABS: 5.0000 mg | ORAL_TABLET | Freq: Once | ORAL | Status: DC | PRN

## 2023-10-28 MED ORDER — CHLORHEXIDINE GLUCONATE 4 % EX SOLN
60.0000 mL | Freq: Once | CUTANEOUS | Status: AC
  Administered 2023-10-28: 4 via TOPICAL

## 2023-10-28 MED ORDER — HYDROMORPHONE HCL 1 MG/ML IJ SOLN: 0.5000 mg | INTRAMUSCULAR | Status: DC | PRN

## 2023-10-28 MED ORDER — OXYCODONE HCL 5 MG PO TABS: 10.0000 mg | ORAL_TABLET | ORAL | Status: DC | PRN

## 2023-10-28 MED ORDER — PROPOFOL 10 MG/ML IV BOLUS
INTRAVENOUS | Status: AC
  Filled 2023-10-28: qty 20

## 2023-10-28 MED ORDER — ACETAMINOPHEN 325 MG PO TABS: 325.0000 mg | ORAL_TABLET | Freq: Four times a day (QID) | ORAL | Status: DC | PRN

## 2023-10-28 MED ORDER — MIDAZOLAM HCL 5 MG/5ML IJ SOLN
INTRAMUSCULAR | Status: DC | PRN
  Administered 2023-10-28: 1 mg via INTRAVENOUS

## 2023-10-28 MED ORDER — GABAPENTIN 300 MG PO CAPS
300.0000 mg | ORAL_CAPSULE | Freq: Once | ORAL | Status: AC
  Administered 2023-10-28: 300 mg via ORAL

## 2023-10-28 MED ORDER — ALUM & MAG HYDROXIDE-SIMETH 200-200-20 MG/5ML PO SUSP: 30.0000 mL | ORAL | Status: DC | PRN

## 2023-10-28 MED ORDER — PANTOPRAZOLE SODIUM 40 MG PO TBEC
40.0000 mg | DELAYED_RELEASE_TABLET | Freq: Two times a day (BID) | ORAL | Status: DC
  Administered 2023-10-28 – 2023-10-29 (×2): 40 mg via ORAL
  Filled 2023-10-28 (×2): qty 1

## 2023-10-28 MED ORDER — PROPOFOL 1000 MG/100ML IV EMUL
INTRAVENOUS | Status: AC
  Filled 2023-10-28: qty 100

## 2023-10-28 MED ORDER — BUPIVACAINE HCL (PF) 0.25 % IJ SOLN
INTRAMUSCULAR | Status: DC | PRN
  Administered 2023-10-28: 60 mL

## 2023-10-28 MED ORDER — OXYCODONE HCL 5 MG PO TABS: 5.0000 mg | ORAL_TABLET | ORAL | Status: DC | PRN

## 2023-10-28 MED ORDER — TRANEXAMIC ACID-NACL 1000-0.7 MG/100ML-% IV SOLN
1000.0000 mg | INTRAVENOUS | Status: AC
  Administered 2023-10-28: 1000 mg via INTRAVENOUS
  Filled 2023-10-28: qty 100

## 2023-10-28 MED ORDER — ACETAMINOPHEN 10 MG/ML IV SOLN
INTRAVENOUS | Status: AC
  Filled 2023-10-28: qty 100

## 2023-10-28 SURGICAL SUPPLY — 63 items
ATTUNE PSFEM LTSZ5 NARCEM KNEE (Femur) IMPLANT
ATTUNE PSRP INSR SZ5 5 KNEE (Insert) IMPLANT
BASEPLATE TIBIAL ROTATING SZ 4 (Knees) IMPLANT
BATTERY INSTRU NAVIGATION (MISCELLANEOUS) ×4 IMPLANT
BIT DRILL QUICK REL 1/8 2PK SL (BIT) ×1 IMPLANT
BLADE CLIPPER SURG (BLADE) IMPLANT
BLADE SAW 70X12.5 (BLADE) ×1 IMPLANT
BLADE SAW 90X13X1.19 OSCILLAT (BLADE) ×1 IMPLANT
BLADE SAW 90X25X1.19 OSCILLAT (BLADE) ×1 IMPLANT
BRUSH SCRUB EZ PLAIN DRY (MISCELLANEOUS) ×1 IMPLANT
CEMENT HV SMART SET (Cement) IMPLANT
COOLER POLAR GLACIER W/PUMP (MISCELLANEOUS) ×1 IMPLANT
CUFF TRNQT CYL 24X4X16.5-23 (TOURNIQUET CUFF) IMPLANT
CUFF TRNQT CYL 30X4X21-28X (TOURNIQUET CUFF) IMPLANT
DRAPE SHEET LG 3/4 BI-LAMINATE (DRAPES) ×1 IMPLANT
DRSG AQUACEL AG ADV 3.5X14 (GAUZE/BANDAGES/DRESSINGS) ×1 IMPLANT
DRSG MEPILEX SACRM 8.7X9.8 (GAUZE/BANDAGES/DRESSINGS) ×1 IMPLANT
DRSG TEGADERM 4X4.75 (GAUZE/BANDAGES/DRESSINGS) ×1 IMPLANT
DURAPREP 26ML APPLICATOR (WOUND CARE) ×2 IMPLANT
ELECT CAUTERY BLADE 6.4 (BLADE) ×1 IMPLANT
ELECTRODE REM PT RTRN 9FT ADLT (ELECTROSURGICAL) ×1 IMPLANT
EVACUATOR 1/8 PVC DRAIN (DRAIN) ×1 IMPLANT
EX-PIN ORTHOLOCK NAV 4X150 (PIN) ×2 IMPLANT
GAUZE XEROFORM 1X8 LF (GAUZE/BANDAGES/DRESSINGS) ×1 IMPLANT
GLOVE BIOGEL M STRL SZ7.5 (GLOVE) ×6 IMPLANT
GLOVE BIOGEL PI IND STRL 8 (GLOVE) ×1 IMPLANT
GLOVE SRG 8 PF TXTR STRL LF DI (GLOVE) ×1 IMPLANT
GOWN STRL REUS W/ TWL LRG LVL3 (GOWN DISPOSABLE) ×1 IMPLANT
GOWN STRL REUS W/ TWL XL LVL3 (GOWN DISPOSABLE) ×1 IMPLANT
GOWN TOGA ZIPPER T7+ PEEL AWAY (MISCELLANEOUS) ×1 IMPLANT
HOLDER FOLEY CATH W/STRAP (MISCELLANEOUS) ×1 IMPLANT
HOOD PEEL AWAY T7 (MISCELLANEOUS) ×1 IMPLANT
KIT TURNOVER KIT A (KITS) ×1 IMPLANT
KNIFE SCULPS 14X20 (INSTRUMENTS) ×1 IMPLANT
MANIFOLD NEPTUNE II (INSTRUMENTS) ×2 IMPLANT
NDL SPNL 20GX3.5 QUINCKE YW (NEEDLE) ×2 IMPLANT
NEEDLE SPNL 20GX3.5 QUINCKE YW (NEEDLE) ×2 IMPLANT
PACK TOTAL KNEE (MISCELLANEOUS) ×1 IMPLANT
PAD ABD DERMACEA PRESS 5X9 (GAUZE/BANDAGES/DRESSINGS) ×2 IMPLANT
PAD ARMBOARD POSITIONER FOAM (MISCELLANEOUS) ×3 IMPLANT
PAD WRAPON POLAR KNEE (MISCELLANEOUS) ×1 IMPLANT
PATELLA MEDIAL ATTUN 35MM KNEE (Knees) IMPLANT
PENCIL SMOKE EVACUATOR COATED (MISCELLANEOUS) ×1 IMPLANT
PIN DRILL FIX HALF THREAD (BIT) ×2 IMPLANT
PIN FIXATION 1/8DIA X 3INL (PIN) ×1 IMPLANT
SOL .9 NS 3000ML IRR UROMATIC (IV SOLUTION) ×1 IMPLANT
SOLUTION IRRIG SURGIPHOR (IV SOLUTION) ×1 IMPLANT
SPONGE DRAIN TRACH 4X4 STRL 2S (GAUZE/BANDAGES/DRESSINGS) ×1 IMPLANT
STAPLER SKIN PROX 35W (STAPLE) ×1 IMPLANT
STOCKINETTE IMPERV 14X48 (MISCELLANEOUS) ×1 IMPLANT
STOCKINETTE STRL BIAS CUT 8X4 (MISCELLANEOUS) ×1 IMPLANT
STRAP TIBIA SHORT (MISCELLANEOUS) ×1 IMPLANT
SUCTION TUBE FRAZIER 10FR DISP (SUCTIONS) ×1 IMPLANT
SUT VIC AB 0 CT1 36 (SUTURE) ×1 IMPLANT
SUT VIC AB 1 CT1 36 (SUTURE) ×2 IMPLANT
SUT VIC AB 2-0 CT2 27 (SUTURE) ×1 IMPLANT
SYR 30ML LL (SYRINGE) ×2 IMPLANT
TIP FAN IRRIG PULSAVAC PLUS (DISPOSABLE) ×1 IMPLANT
TOWEL OR 17X26 4PK STRL BLUE (TOWEL DISPOSABLE) ×1 IMPLANT
TOWER CARTRIDGE SMART MIX (DISPOSABLE) ×1 IMPLANT
TRAP FLUID SMOKE EVACUATOR (MISCELLANEOUS) ×1 IMPLANT
TRAY FOLEY MTR SLVR 16FR STAT (SET/KITS/TRAYS/PACK) ×1 IMPLANT
WATER STERILE IRR 1000ML POUR (IV SOLUTION) ×1 IMPLANT

## 2023-10-28 NOTE — Anesthesia Procedure Notes (Signed)
 Spinal  Start time: 10/28/2023 11:45 AM End time: 10/28/2023 11:50 AM Staffing Performed: resident/CRNA  Resident/CRNA: Wilkins Hardy I, CRNA Performed by: Wilkins Hardy I, CRNA Authorized by: Baltazar Bonier, MD   Preanesthetic Checklist Completed: patient identified, IV checked, site marked, risks and benefits discussed, surgical consent, monitors and equipment checked, pre-op evaluation and timeout performed Spinal Block Patient position: sitting Prep: ChloraPrep Patient monitoring: heart rate, continuous pulse ox and blood pressure Location: L4-5 Injection technique: single-shot Needle Needle type: Pencil-Tip  Needle gauge: 24 G Needle length: 9 cm Needle insertion depth: 7 cm Assessment Events: CSF return

## 2023-10-28 NOTE — Plan of Care (Signed)
  Problem: Nutrition: Goal: Adequate nutrition will be maintained Outcome: Progressing   Problem: Pain Managment: Goal: General experience of comfort will improve and/or be controlled Outcome: Progressing

## 2023-10-28 NOTE — Anesthesia Preprocedure Evaluation (Addendum)
 Anesthesia Evaluation  Patient identified by MRN, date of birth, ID band Patient awake    Reviewed: Allergy & Precautions, H&P , NPO status , Patient's Chart, lab work & pertinent test results  Airway Mallampati: I  TM Distance: >3 FB Neck ROM: full    Dental  (+) Edentulous Upper   Pulmonary neg pulmonary ROS   Pulmonary exam normal        Cardiovascular hypertension, Normal cardiovascular exam     Neuro/Psych negative neurological ROS  negative psych ROS   GI/Hepatic negative GI ROS, Neg liver ROS,,,  Endo/Other  negative endocrine ROS    Renal/GU negative Renal ROS  negative genitourinary   Musculoskeletal  (+) Arthritis ,    Abdominal Normal abdominal exam  (+)   Peds  Hematology negative hematology ROS (+)   Anesthesia Other Findings Past Medical History: No date: Breast cancer (HCC) No date: Diverticulitis of sigmoid colon No date: Endometriosis No date: Family history of breast cancer No date: Family history of ovarian cancer No date: Family history of prostate cancer No date: Family history of uterine cancer No date: Fibrocystic breast disease No date: Glaucoma No date: H/O urinary incontinence No date: History of chicken pox 2014: History of colon polyps No date: Hyperlipidemia No date: Hypertension 2002: Ovarian cancer (HCC)     Comment:  Chemo tx's @ [facility] with Total Hysterectomy. Dr Luster Salters No date: Personal history of chemotherapy     Comment:  ovarian cancer No date: Personal history of ovarian cancer No date: Personal history of radiation therapy No date: Port-A-Cath in place No date: Sinusitis No date: Tuberculosis     Comment:  positve skin test  Past Surgical History: 2003: ABDOMINAL HYSTERECTOMY     Comment:  complete Dr Luster Salters 2017: ABSCESS DRAINAGE     Comment:  right buttock at [facility] 2003: APPENDECTOMY 1993: BREAST EXCISIONAL BIOPSY; Left     Comment:  excisional bx. 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 1982: BREAST SURGERY; Left     Comment:  papilloma removal 2006, 2014: COLONOSCOPY     Comment:  Dr Felicita Horns  03/27/2016: COLONOSCOPY; N/A     Comment:  Procedure: COLONOSCOPY;  Surgeon: Marshall Skeeter, MD;              Location: [facility] ENDOSCOPY;  Service: Endoscopy;                Laterality: N/A; 2014: COLOSTOMY REVERSAL     Comment:  at [facility] Dr Dorman Gaskin 10/2022: KNEE SURGERY; Right     Comment:  knee replacement No date: NASAL SINUS SURGERY     Comment:  For fungal infection 8/05 8/06: NASAL SINUS SURGERY No date: OOPHORECTOMY 05/22/2012: OSTOMY     Comment:  diverticulitis with abcess/ Dr Felipe Horton No date: ovarian cancer     Comment:  Exploratory lap 05/26/2018: PORTA CATH INSERTION; N/A     Comment:  Procedure: PORTA CATH INSERTION;  Surgeon: Celso College,              MD;  Location: [facility] INVASIVE CV LAB;  Service:               Cardiovascular;  Laterality: N/A; 11/12/2019: PORTA CATH REMOVAL; N/A     Comment:  Procedure: PORTA CATH REMOVAL;  Surgeon: Celso College,               MD;  Location: [facility] INVASIVE CV LAB;  Service:               Cardiovascular;  Laterality: N/A; 1947: TONSILLECTOMY AND ADENOIDECTOMY     Reproductive/Obstetrics negative OB ROS                             Anesthesia Physical Anesthesia Plan  ASA: 2  Anesthesia Plan: General   Post-op Pain Management:    Induction:   PONV Risk Score and Plan: Propofol  infusion and TIVA  Airway Management Planned: Natural Airway  Additional Equipment:   Intra-op Plan:   Post-operative Plan:   Informed Consent: I have  reviewed the patients History and Physical, chart, labs and discussed the procedure including the risks, benefits and alternatives for the proposed anesthesia with the patient or authorized representative who has indicated his/her understanding and acceptance.     Dental Advisory Given  Plan Discussed with: CRNA and Surgeon  Anesthesia Plan Comments:         Anesthesia Quick Evaluation

## 2023-10-28 NOTE — Interval H&P Note (Signed)
 History and Physical Interval Note:  10/28/2023 11:12 AM  [name] [name]  has presented today for surgery, with the diagnosis of PRIMARY OSTEOARTHRITIS OF LEFT KNEE..  The various methods of treatment have been discussed with the patient and family. After consideration of risks, benefits and other options for treatment, the patient has consented to  Procedure(s): ARTHROPLASTY, KNEE, TOTAL, USING IMAGELESS COMPUTER-ASSISTED NAVIGATION (Left) as a surgical intervention.  The patient's history has been reviewed, patient examined, no change in status, stable for surgery.  I have reviewed the patient's chart and labs.  Questions were answered to the patient's satisfaction.     Lemon Sternberg P Jamyiah Labella

## 2023-10-28 NOTE — Transfer of Care (Signed)
 Immediate Anesthesia Transfer of Care Note  Patient: [name] [name]  Procedure(s) Performed: ARTHROPLASTY, KNEE, TOTAL, USING IMAGELESS COMPUTER-ASSISTED NAVIGATION (Left: Knee)  Patient Location: PACU  Anesthesia Type:MAC and Spinal  Level of Consciousness: awake and alert   Airway & Oxygen Therapy: Patient Spontanous Breathing and Patient connected to face mask oxygen  Post-op Assessment: Report given to RN and Post -op Vital signs reviewed and stable  Post vital signs: stable  Last Vitals:  Vitals Value Taken Time  BP 150/75 10/28/23 1534  Temp 37 C 10/28/23 1534  Pulse 74 10/28/23 1536  Resp 21 10/28/23 1536  SpO2 100 % 10/28/23 1536  Vitals shown include unfiled device data.  Last Pain:  Vitals:   10/28/23 1047  TempSrc: Temporal  PainSc: 0-No pain         Complications: No notable events documented.

## 2023-10-28 NOTE — Op Note (Signed)
 OPERATIVE NOTE  DATE OF SURGERY:  10/28/2023  PATIENT NAME:  [name] [name]   DOB: 08-30-1933  MRN: [id number]  PRE-OPERATIVE DIAGNOSIS: Degenerative arthrosis of the left knee, primary  POST-OPERATIVE DIAGNOSIS:  Same  PROCEDURE:  Left total knee arthroplasty using computer-assisted navigation  SURGEON:  Maxene Span. M.D.  ASSISTANT:  Benjiman Bras, PA-C (present and scrubbed throughout the case, critical for assistance with exposure, retraction, instrumentation, and closure)  ANESTHESIA: spinal  ESTIMATED BLOOD LOSS: 50 mL  FLUIDS REPLACED: 1400 mL of crystalloid  TOURNIQUET TIME: 97 minutes  DRAINS: 2 medium Hemovac drains  SOFT TISSUE RELEASES: Anterior cruciate ligament, posterior cruciate ligament, deep medial collateral ligament, patellofemoral ligament  IMPLANTS UTILIZED: DePuy Attune size 5N posterior stabilized femoral component (cemented), size 4 rotating platform tibial component (cemented), 35 mm medialized dome patella (cemented), and a 5 mm stabilized rotating platform polyethylene insert.  INDICATIONS FOR SURGERY: [name] [name] is a 89 y.o. year old female with a long history of progressive knee pain. X-rays demonstrated severe degenerative changes in tricompartmental fashion. The patient had not seen any significant improvement despite conservative nonsurgical intervention. After discussion of the risks and benefits of surgical intervention, the patient expressed understanding of the risks benefits and agree with plans for total knee arthroplasty.   The risks, benefits, and alternatives were discussed at length including but not limited to the risks of infection, bleeding, nerve injury, stiffness, blood clots, the need for revision surgery, cardiopulmonary complications, among others, and they were willing to proceed.  PROCEDURE IN DETAIL: The patient was brought into the operating room and, after adequate spinal anesthesia was achieved, a tourniquet was  placed on the patient's upper thigh. The patient's knee and leg were cleaned and prepped with alcohol and DuraPrep and draped in the usual sterile fashion. A "timeout" was performed as per usual protocol. The lower extremity was exsanguinated using an Esmarch, and the tourniquet was inflated to 300 mmHg. An anterior longitudinal incision was made followed by a standard mid vastus approach. The deep fibers of the medial collateral ligament were elevated in a subperiosteal fashion off of the medial flare of the tibia so as to maintain a continuous soft tissue sleeve. The patella was subluxed laterally and the patellofemoral ligament was incised. Inspection of the knee demonstrated severe degenerative changes with full-thickness loss of articular cartilage. Osteophytes were debrided using a rongeur. Anterior and posterior cruciate ligaments were excised. Two 4.0 mm Schanz pins were inserted in the femur and into the tibia for attachment of the array of trackers used for computer-assisted navigation. Hip center was identified using a circumduction technique. Distal landmarks were mapped using the computer. The distal femur and proximal tibia were mapped using the computer. The distal femoral cutting guide was positioned using computer-assisted navigation so as to achieve a 5 distal valgus cut. The femur was sized and it was felt that a size 5N femoral component was appropriate. A size 5 femoral cutting guide was positioned and the anterior cut was performed and verified using the computer. This was followed by completion of the posterior and chamfer cuts. Femoral cutting guide for the central box was then positioned in the center box cut was performed.  Attention was then directed to the proximal tibia. Medial and lateral menisci were excised. The extramedullary tibial cutting guide was positioned using computer-assisted navigation so as to achieve a 0 varus-valgus alignment and 3 posterior slope. The cut was  performed and verified using the computer. The proximal tibia  was sized and it was felt that a size 4 tibial tray was appropriate. Tibial and femoral trials were inserted followed by insertion of a 5 mm polyethylene insert. This allowed for excellent mediolateral soft tissue balancing both in flexion and in full extension. Finally, the patella was cut and prepared so as to accommodate a 35 mm medialized dome patella. A patella trial was placed and the knee was placed through a range of motion with excellent patellar tracking appreciated. The femoral trial was removed after debridement of posterior osteophytes. The central post-hole for the tibial component was reamed followed by insertion of a keel punch. Tibial trials were then removed. Cut surfaces of bone were irrigated with copious amounts of normal saline using pulsatile lavage and then suctioned dry. Polymethylmethacrylate cement was prepared in the usual fashion using a vacuum mixer. Cement was applied to the cut surface of the proximal tibia as well as along the undersurface of a size 4 rotating platform tibial component. Tibial component was positioned and impacted into place. Excess cement was removed using Personal assistant. Cement was then applied to the cut surfaces of the femur as well as along the posterior flanges of the size 5N femoral component. The femoral component was positioned and impacted into place. Excess cement was removed using Personal assistant. A 5 mm polyethylene trial was inserted and the knee was brought into full extension with steady axial compression applied. Finally, cement was applied to the backside of a 35 mm medialized dome patella and the patellar component was positioned and patellar clamp applied. Excess cement was removed using Personal assistant. After adequate curing of the cement, the tourniquet was deflated after a total tourniquet time of 97 minutes. Hemostasis was achieved using electrocautery. The knee was irrigated with  copious amounts of normal saline using pulsatile lavage followed by 450 ml of Surgiphor and then suctioned dry. 20 mL of 1.3% Exparel  and 60 mL of 0.25% Marcaine  in 40 mL of normal saline was injected along the posterior capsule, medial and lateral gutters, and along the arthrotomy site. A 5 mm stabilized rotating platform polyethylene insert was inserted and the knee was placed through a range of motion with excellent mediolateral soft tissue balancing appreciated and excellent patellar tracking noted. 2 medium drains were placed in the wound bed and brought out through separate stab incisions. The medial parapatellar portion of the incision was reapproximated using interrupted sutures of #1 Vicryl. Subcutaneous tissue was approximated in layers using first #0 Vicryl followed #2-0 Vicryl. The skin was approximated with skin staples. A sterile dressing was applied.  The patient tolerated the procedure well and was transported to the recovery room in stable condition.    Aminat Shelburne P. Myalynn Lingle, Jr., M.D.

## 2023-10-28 NOTE — Anesthesia Postprocedure Evaluation (Signed)
 Anesthesia Post Note  Patient: [name] [name]  Procedure(s) Performed: ARTHROPLASTY, KNEE, TOTAL, USING IMAGELESS COMPUTER-ASSISTED NAVIGATION (Left: Knee)  Patient location during evaluation: PACU Anesthesia Type: Spinal Level of consciousness: oriented and awake and alert Pain management: pain level controlled Vital Signs Assessment: post-procedure vital signs reviewed and stable Respiratory status: spontaneous breathing, respiratory function stable and patient connected to nasal cannula oxygen Cardiovascular status: blood pressure returned to baseline and stable Postop Assessment: no headache, no backache and no apparent nausea or vomiting Anesthetic complications: no   No notable events documented.   Last Vitals:  Vitals:   10/28/23 1545 10/28/23 1600  BP: (!) 149/63 (!) 161/70  Pulse: 72 71  Resp: 20 20  Temp:    SpO2: 100% 100%    Last Pain:  Vitals:   10/28/23 1600  TempSrc:   PainSc: 0-No pain                 Dorothey Gate

## 2023-10-28 NOTE — Progress Notes (Signed)
 Patient is not able to walk the distance required to go the bathroom, or he/she is unable to safely negotiate stairs required to access the bathroom.  A 3in1 BSC will alleviate this problem   Amenda Duclos P. Angie Fava M.D.

## 2023-10-29 ENCOUNTER — Encounter: Payer: Self-pay | Admitting: Orthopedic Surgery

## 2023-10-29 DIAGNOSIS — M1712 Unilateral primary osteoarthritis, left knee: Secondary | ICD-10-CM | POA: Diagnosis not present

## 2023-10-29 MED ORDER — ASPIRIN 81 MG PO CHEW: 81.0000 mg | CHEWABLE_TABLET | Freq: Two times a day (BID) | ORAL | Status: AC

## 2023-10-29 MED ORDER — ACETAMINOPHEN 10 MG/ML IV SOLN
INTRAVENOUS | Status: AC
  Filled 2023-10-29: qty 100

## 2023-10-29 MED ORDER — CELECOXIB 200 MG PO CAPS: 200.0000 mg | ORAL_CAPSULE | Freq: Two times a day (BID) | ORAL | 1 refills | Status: AC

## 2023-10-29 MED ORDER — OXYCODONE HCL 5 MG PO TABS: 5.0000 mg | ORAL_TABLET | ORAL | 0 refills | Status: AC | PRN

## 2023-10-29 MED ORDER — TRAMADOL HCL 50 MG PO TABS: 50.0000 mg | ORAL_TABLET | ORAL | 0 refills | Status: AC | PRN

## 2023-10-29 MED ORDER — AMLODIPINE BESYLATE 5 MG PO TABS
ORAL_TABLET | ORAL | Status: AC
  Filled 2023-10-29: qty 1

## 2023-10-29 NOTE — Progress Notes (Signed)
 Subjective: 1 Day Post-Op Procedure(s) (LRB): ARTHROPLASTY, KNEE, TOTAL, USING IMAGELESS COMPUTER-ASSISTED NAVIGATION (Left) Patient reports pain as mild.   Patient seen in rounds with Dr. Aubry Blase. Patient is well, and has had no acute complaints or problems. Denies any CP, SOB, N/V, fevers or chills We will start therapy today.  Plan is to go Home after hospital stay.  Objective: Vital signs in last 24 hours: Temp:  [97.5 F (36.4 C)-98.6 F (37 C)] 97.8 F (36.6 C) (05/20 0754) Pulse Rate:  [60-80] 60 (05/20 0754) Resp:  [14-20] 15 (05/20 0754) BP: (117-161)/(49-86) 127/58 (05/20 0754) SpO2:  [94 %-100 %] 97 % (05/20 0754) Weight:  [72.6 kg] 72.6 kg (05/19 1047)  Intake/Output from previous day:  Intake/Output Summary (Last 24 hours) at 10/29/2023 0758 Last data filed at 10/29/2023 0342 Gross per 24 hour  Intake 1850 ml  Output 660 ml  Net 1190 ml    Intake/Output this shift: No intake/output data recorded.  Labs: No results for input(s): "HGB" in the last 72 hours. No results for input(s): "WBC", "RBC", "HCT", "PLT" in the last 72 hours. No results for input(s): "NA", "K", "CL", "CO2", "BUN", "CREATININE", "GLUCOSE", "CALCIUM" in the last 72 hours. No results for input(s): "LABPT", "INR" in the last 72 hours.  EXAM General - Patient is Alert, Appropriate, and Oriented Extremity - Neurologically intact Neurovascular intact Sensation intact distally Intact pulses distally Dorsiflexion/Plantar flexion intact No cellulitis present Compartment soft Dressing - dressing C/D/I and no drainage Motor Function - intact, moving foot and toes well on exam.  JP Drain pulled without difficulty. Intact  Past Medical History:  Diagnosis Date   Breast cancer (HCC)    Diverticulitis of sigmoid colon    Endometriosis    Family history of breast cancer    Family history of ovarian cancer    Family history of prostate cancer    Family history of uterine cancer    Fibrocystic  breast disease    Glaucoma    H/O urinary incontinence    History of chicken pox    History of colon polyps 2014   Hyperlipidemia    Hypertension    Ovarian cancer (HCC) 2002   Chemo tx's @ [facility] with Total Hysterectomy. Dr Luster Salters   Personal history of chemotherapy    ovarian cancer   Personal history of ovarian cancer    Personal history of radiation therapy    Port-A-Cath in place    Sinusitis    Tuberculosis    positve skin test    Assessment/Plan: 1 Day Post-Op Procedure(s) (LRB): ARTHROPLASTY, KNEE, TOTAL, USING IMAGELESS COMPUTER-ASSISTED NAVIGATION (Left) Principal Problem:   History of total knee arthroplasty, left  Estimated body mass index is 28.34 kg/m as calculated from the following:   Height as of this encounter: 5\' 3"  (1.6 m).   Weight as of this encounter: 72.6 kg. Advance diet Up with therapy  Patient will continue to work with physical therapy to pass postoperative PT protocols, ROM and strengthening  Discussed with the patient continuing to utilize Polar Care  Patient will use bone foam in 20-30 minute intervals  Patient will wear TED hose bilaterally to help prevent DVT and clot formation  Discussed the Aquacel bandage.  This bandage will stay in place 7 days postoperatively.  Can be replaced with honeycomb bandages that will be sent home with the patient  Discussed sending the patient home with tramadol  and oxycodone  for as needed pain management.  Patient will also be sent home with  Celebrex  to help with swelling and inflammation.  Patient will take an 81 mg aspirin  twice daily for DVT prophylaxis  JP drain removed without difficulty, intact  Weight-Bearing as tolerated to left leg  Patient will follow-up with [facility] orthopedics in 2 weeks for staple removal and reevaluation  Wadie Guile, PA-C [facility] 10/29/2023, 7:58 AM

## 2023-10-29 NOTE — Progress Notes (Signed)
 Physical Therapy Treatment Patient Details Name: [name] [name] MRN: [id number] DOB: 01/04/1934 Today's Date: 10/29/2023   History of Present Illness Pt is an 89 yo F diagnosed with degenerative arthrosis of the left knee and is s/p elective L TKA.  PMH includes HTN, HLD, ovarian cancer, colostomy, breast cancer, and glaucoma.    PT Comments  Overall the pt mobilized well this session, supervision with RW for transfers and ambulation, CGA for stair navigation. Pt needed one verbal cue for safe hand placement for transfers, and then with a different transfer education on RW placement (left RW outside BOS). Pt able to verbalize and perform safe stair technique as well. Family present for education on TKE positioning at rest, HEP review, polar care use and positioning, as well as need for assistance with ADLs/IADLs at discharge. Pt and family verbalized understanding of education, PT also provided written instructions as appropriate. The patient would benefit from further skilled PT intervention to continue to progress towards goals.    If plan is discharge home, recommend the following: A little help with walking and/or transfers;A little help with bathing/dressing/bathroom;Assistance with cooking/housework;Assist for transportation;Help with stairs or ramp for entrance   Can travel by private vehicle        Equipment Recommendations  Rolling walker (2 wheels);BSC/3in1    Recommendations for Other Services       Precautions / Restrictions Precautions Precautions: Fall Recall of Precautions/Restrictions: Intact Restrictions Weight Bearing Restrictions Per Provider Order: Yes LLE Weight Bearing Per Provider Order: Weight bearing as tolerated     Mobility  Bed Mobility               General bed mobility comments: NT up in recliner    Transfers Overall transfer level: Needs assistance Equipment used: Rolling walker (2 wheels) Transfers: Sit to/from Stand Sit to Stand:  Supervision           General transfer comment: cued for hand placement once    Ambulation/Gait   Gait Distance (Feet): 200 Feet Assistive device: Rolling walker (2 wheels) Gait Pattern/deviations: Step-through pattern, Decreased step length - right, Decreased stance time - left Gait velocity: decreased         Stairs   Stairs assistance: Contact guard assist Stair Management: With walker, Backwards, Forwards, Step to pattern Number of Stairs: 1 General stair comments: able to verbalize correct technique, and then also able to perform correct technique. CGA. family present for education   Wheelchair Mobility     Tilt Bed    Modified Rankin (Stroke Patients Only)       Balance Overall balance assessment: Needs assistance Sitting-balance support: Feet supported Sitting balance-Leahy Scale: Good     Standing balance support: Bilateral upper extremity supported, During functional activity Standing balance-Leahy Scale: Good                              Communication Communication Communication: No apparent difficulties  Cognition Arousal: Alert Behavior During Therapy: WFL for tasks assessed/performed   PT - Cognitive impairments: No apparent impairments                         Following commands: Intact      Cueing Cueing Techniques: Verbal cues, Visual cues, Tactile cues  Exercises      General Comments        Pertinent Vitals/Pain Pain Assessment Pain Assessment: 0-10 Pain Score: 4  Pain Descriptors /  Indicators: Sore Pain Intervention(s): Limited activity within patient's tolerance, Monitored during session, Repositioned    Home Living Family/patient expects to be discharged to:: Private residence Living Arrangements: Alone Available Help at Discharge: Family;Available PRN/intermittently (niece can stay for one night) Type of Home: House Home Access: Stairs to enter Entrance Stairs-Rails: None Entrance  Stairs-Number of Steps: 1 with a post   Home Layout: One level Home Equipment: Grab bars - tub/shower;Shower seat;Cane - single point;Rollator (4 wheels) Additional Comments: niece and nephew that can check in-niece will stay her first night home and can stay more if needed/check in or pt reports she can get someone to stay if needed    Prior Function            PT Goals (current goals can now be found in the care plan section) Acute Rehab PT Goals Patient Stated Goal: To walk better without pain PT Goal Formulation: With patient Time For Goal Achievement: 11/11/23 Potential to Achieve Goals: Good Progress towards PT goals: Progressing toward goals    Frequency    BID      PT Plan      Co-evaluation              AM-PAC PT "6 Clicks" Mobility   Outcome Measure  Help needed turning from your back to your side while in a flat bed without using bedrails?: A Little Help needed moving from lying on your back to sitting on the side of a flat bed without using bedrails?: A Little Help needed moving to and from a bed to a chair (including a wheelchair)?: A Little Help needed standing up from a chair using your arms (e.g., wheelchair or bedside chair)?: A Little Help needed to walk in hospital room?: A Little Help needed climbing 3-5 steps with a railing? : A Little 6 Click Score: 18    End of Session Equipment Utilized During Treatment: Gait belt Activity Tolerance: Patient tolerated treatment well Patient left: in chair;with call bell/phone within reach (polar care donned to L knee) Nurse Communication: Mobility status;Weight bearing status PT Visit Diagnosis: Other abnormalities of gait and mobility (R26.89);Muscle weakness (generalized) (M62.81);Pain Pain - Right/Left: Left Pain - part of body: Knee     Time: 0960-4540 PT Time Calculation (min) (ACUTE ONLY): 28 min  Charges:    $Gait Training: 8-22 mins $Therapeutic Exercise: 8-22 mins $Therapeutic Activity:  23-37 mins PT General Charges $$ ACUTE PT VISIT: 1 Visit                     Darien Eden PT, DPT 3:17 PM,10/29/23

## 2023-10-29 NOTE — TOC Progression Note (Signed)
 Transition of Care [facility]) - Progression Note    Patient Details  Name: [name] [name] MRN: [id number] Date of Birth: 01/28/1934  [facility] CM/SW Contact  Alexandra Ice, RN Phone Number: 10/29/2023, 7:48 AM  Clinical Narrative:     Received message from bedside nurse that patient needs RW and BSC. Sent DME orders to Jon with [employer] for processing.  Patient set up with [facility] by surgeon's office prior to surgery. Agency information added to AVS.       Expected Discharge Plan and Services                                               Social Determinants of Health (SDOH) Interventions SDOH Screenings   Food Insecurity: No Food Insecurity (10/28/2023)  Housing: Low Risk  (10/28/2023)  Transportation Needs: No Transportation Needs (10/28/2023)  Utilities: Not At Risk (10/28/2023)  Alcohol Screen: Low Risk  (01/01/2023)  Depression (PHQ2-9): Low Risk  (10/18/2023)  Financial Resource Strain: Low Risk  (01/01/2023)  Physical Activity: Inactive (01/01/2023)  Social Connections: Moderately Integrated (10/28/2023)  Stress: No Stress Concern Present (01/01/2023)  Tobacco Use: Low Risk  (10/28/2023)  Health Literacy: Adequate Health Literacy (01/01/2023)    Readmission Risk Interventions     No data to display

## 2023-10-29 NOTE — Progress Notes (Signed)
 DISCHARGE NOTE:    Pt's IV removed and pt dc instructions given. Pt equipment delivered to hospital room. Pt also received polar care, bone foam, medication scripts, 2 honeycomb dressings and has both TED hose on and in place. Pt wheeled down to medical mall entrance by staff and pt's niece provided transportation.

## 2023-10-29 NOTE — Discharge Summary (Signed)
 Physician Discharge Summary  Subjective: 1 Day Post-Op Procedure(s) (LRB): ARTHROPLASTY, KNEE, TOTAL, USING IMAGELESS COMPUTER-ASSISTED NAVIGATION (Left) Patient reports pain as mild.   Patient seen in rounds with Dr. Aubry Blase. Patient is well, and has had no acute complaints or problems. Denies any CP, SOB, N/V, fevers or chills We will start therapy today.  Patient is ready to go home  Physician Discharge Summary  Patient ID: [name] [name] MRN: [id number] DOB/AGE: [birth year] 89 y.o.  Admit date: 10/28/2023 Discharge date: 10/29/2023  Admission Diagnoses:  Discharge Diagnoses:  Principal Problem:   History of total knee arthroplasty, left   Discharged Condition: good  Hospital Course: Patient presented to the hospital on 10/28/2023 for an elective left total knee arthroplasty performed by Dr. Aubry Blase. Patient was given 1g of TXA and 2g of Ancef  prior to the procedure. she tolerated the procedure well without any complications. See procedural note below for details. Postoperatively, the patient did very well. she was able to pass PT protocols on post-op day one without any issues. JP drain was removed without any difficulty and was intact. she was able to void her bladder without any difficulty. Physical exam was unremarkable. she denies any SOB, CP, N/V, fevers or chills. Vital signs are stable. Patient is stable to discharge home.  PROCEDURE:  Left total knee arthroplasty using computer-assisted navigation   SURGEON:  Maxene Span. M.D.   ASSISTANT:  Benjiman Bras, PA-C (present and scrubbed throughout the case, critical for assistance with exposure, retraction, instrumentation, and closure)   ANESTHESIA: spinal   ESTIMATED BLOOD LOSS: 50 mL   FLUIDS REPLACED: 1400 mL of crystalloid   TOURNIQUET TIME: 97 minutes   DRAINS: 2 medium Hemovac drains   SOFT TISSUE RELEASES: Anterior cruciate ligament, posterior cruciate ligament, deep medial collateral ligament,  patellofemoral ligament   IMPLANTS UTILIZED: DePuy Attune size 5N posterior stabilized femoral component (cemented), size 4 rotating platform tibial component (cemented), 35 mm medialized dome patella (cemented), and a 5 mm stabilized rotating platform polyethylene insert.  Treatments: None  Discharge Exam: Blood pressure (!) 127/58, pulse 60, temperature 97.8 F (36.6 C), temperature source Oral, resp. rate 15, height 5\' 3"  (1.6 m), weight 72.6 kg, SpO2 97%.   Disposition: home   Allergies as of 10/29/2023   No Known Allergies      Medication List     TAKE these medications    acetaminophen  650 MG CR tablet Commonly known as: TYLENOL  Take 650 mg by mouth every 8 (eight) hours as needed for pain.   amLODipine  2.5 MG tablet Commonly known as: NORVASC  Take 1 tablet (2.5 mg total) by mouth daily.   aspirin  81 MG chewable tablet Chew 1 tablet (81 mg total) by mouth 2 (two) times daily.   Caltrate 600+D3 Soft 600-20 MG-MCG Chew Generic drug: Calcium Carb-Cholecalciferol Chew 1 tablet by mouth in the morning and at bedtime.   celecoxib  200 MG capsule Commonly known as: CELEBREX  Take 1 capsule (200 mg total) by mouth 2 (two) times daily.   denosumab  60 MG/ML Sosy injection Commonly known as: PROLIA  Inject 60 mg into the skin every 6 (six) months.   gabapentin  100 MG capsule Commonly known as: NEURONTIN  Take 1 capsule (100 mg total) by mouth 3 (three) times daily. What changed:  when to take this reasons to take this   multivitamin with minerals Tabs tablet Take 1 tablet by mouth in the morning.   oxyCODONE  5 MG immediate release tablet Commonly known as: Oxy IR/ROXICODONE  Take 1  tablet (5 mg total) by mouth every 4 (four) hours as needed for moderate pain (pain score 4-6) (pain score 4-6).   traMADol  50 MG tablet Commonly known as: ULTRAM  Take 1-2 tablets (50-100 mg total) by mouth every 4 (four) hours as needed for moderate pain (pain score 4-6).                Durable Medical Equipment  (From admission, onward)           Start     Ordered   10/28/23 1725  DME Walker rolling  Once       Question:  Patient needs a walker to treat with the following condition  Answer:  Total knee replacement status   10/28/23 1724   10/28/23 1725  DME Bedside commode  Once       Comments: Patient is not able to walk the distance required to go the bathroom, or he/she is unable to safely negotiate stairs required to access the bathroom.  A 3in1 BSC will alleviate this problem  Question:  Patient needs a bedside commode to treat with the following condition  Answer:  Total knee replacement status   10/28/23 1724            Follow-up Information     Wadie Guile, PA-C Follow up on 11/12/2023.   Specialty: Orthopedic Surgery Why: at 2:45pm Contact information: [address] [place] [address] [phone]         Arlyne Lame, MD Follow up on 12/10/2023.   Specialty: Orthopedic Surgery Why: at 1:30pm Contact information: [address] [place] [address] [phone]                 Signed: Benjiman Bras 10/29/2023, 8:35 AM   Objective: Vital signs in last 24 hours: Temp:  [97.5 F (36.4 C)-98.6 F (37 C)] 97.8 F (36.6 C) (05/20 0754) Pulse Rate:  [60-80] 60 (05/20 0754) Resp:  [14-20] 15 (05/20 0754) BP: (117-161)/(49-86) 127/58 (05/20 0754) SpO2:  [94 %-100 %] 97 % (05/20 0754) Weight:  [72.6 kg] 72.6 kg (05/19 1047)  Intake/Output from previous day:  Intake/Output Summary (Last 24 hours) at 10/29/2023 0835 Last data filed at 10/29/2023 0342 Gross per 24 hour  Intake 1850 ml  Output 660 ml  Net 1190 ml    Intake/Output this shift: No intake/output data recorded.  Labs: No results for input(s): "HGB" in the last 72 hours. No results for input(s): "WBC", "RBC", "HCT", "PLT" in the last 72 hours. No results for input(s): "NA", "K", "CL", "CO2", "BUN", "CREATININE",  "GLUCOSE", "CALCIUM" in the last 72 hours. No results for input(s): "LABPT", "INR" in the last 72 hours.  EXAM: General - Patient is Alert, Appropriate, and Oriented Extremity - Neurologically intact Neurovascular intact Sensation intact distally Intact pulses distally Dorsiflexion/Plantar flexion intact No cellulitis present Compartment soft Dressing - dressing C/D/I and no drainage Motor Function - intact, moving foot and toes well on exam.  JP Drain pulled without difficulty. Intact  Assessment/Plan: 1 Day Post-Op Procedure(s) (LRB): ARTHROPLASTY, KNEE, TOTAL, USING IMAGELESS COMPUTER-ASSISTED NAVIGATION (Left) Procedure(s) (LRB): ARTHROPLASTY, KNEE, TOTAL, USING IMAGELESS COMPUTER-ASSISTED NAVIGATION (Left) Past Medical History:  Diagnosis Date   Breast cancer (HCC)    Diverticulitis of sigmoid colon    Endometriosis    Family history of breast cancer    Family history of ovarian cancer    Family history of prostate cancer    Family history of uterine cancer  Fibrocystic breast disease    Glaucoma    H/O urinary incontinence    History of chicken pox    History of colon polyps 2014   Hyperlipidemia    Hypertension    Ovarian cancer (HCC) 2002   Chemo tx's @ [facility] with Total Hysterectomy. Dr Luster Salters   Personal history of chemotherapy    ovarian cancer   Personal history of ovarian cancer    Personal history of radiation therapy    Port-A-Cath in place    Sinusitis    Tuberculosis    positve skin test   Principal Problem:   History of total knee arthroplasty, left  Estimated body mass index is 28.34 kg/m as calculated from the following:   Height as of this encounter: 5\' 3"  (1.6 m).   Weight as of this encounter: 72.6 kg.  Patient will continue to work with physical therapy   Discussed with the patient continuing to utilize Polar Care   Patient will use bone foam in 20-30 minute intervals   Patient will wear TED hose bilaterally to help prevent DVT and  clot formation   Discussed the Aquacel bandage.  This bandage will stay in place 7 days postoperatively.  Can be replaced with honeycomb bandages that will be sent home with the patient   Discussed sending the patient home with tramadol  and oxycodone  for as needed pain management.  Patient will also be sent home with Celebrex  to help with swelling and inflammation.  Patient will take an 81 mg aspirin  twice daily for DVT prophylaxis   JP drain removed without difficulty, intact   Weight-Bearing as tolerated to left leg   Patient will follow-up with [facility] orthopedics in 2 weeks for staple removal and reevaluation  Diet - Regular diet Follow up - in 2 weeks Activity - WBAT Disposition - Home Condition Upon Discharge - Good DVT Prophylaxis - Aspirin  and TED hose  Standley Earing, PA-C Orthopaedic Surgery 10/29/2023, 8:35 AM

## 2023-10-29 NOTE — Care Management Obs Status (Signed)
 MEDICARE OBSERVATION STATUS NOTIFICATION   Patient Details  Name: [name] [name] MRN: [id number] Date of Birth: 1933-10-19   Medicare Observation Status Notification Given:  Yes    Tailor Westfall W, CMA 10/29/2023, 11:17 AM

## 2023-10-29 NOTE — Evaluation (Signed)
 Occupational Therapy Evaluation Patient Details Name: [name] [name] MRN: [id number] DOB: 04-Jun-1934 Today's Date: 10/29/2023   History of Present Illness   Pt is an 89 yo F diagnosed with degenerative arthrosis of the left knee and is s/p elective L TKA.  PMH includes HTN, HLD, ovarian cancer, colostomy, breast cancer, and glaucoma.     Clinical Impressions Pt seen for OT evaluation this date, POD#1 from above surgery. Pt was independent in all ADLs prior to surgery, living alone. Ambulatory with SPC. Pt currently requires supervision for bed mobility, STS from EOB to RW and CGA for mobility to the bathroom and back. Pt needed CGA for toilet transfer using grab bars, SUP for peri-care. She required Min assist for LB dressing to don socks while in seated position due to pain and limited AROM of L knee. Able to dress UB with set up assist and don pants with supervision. Standing hand hygiene with RW with CGA/SBA. Pt instructed in polar care mgt, falls prevention strategies, home/routines modifications, DME/AE for LB bathing and dressing tasks, and compression stocking mgt. Pt would benefit from skilled OT services including additional instruction in dressing techniques with or without assistive devices for dressing and bathing skills to support recall and carryover prior to discharge and ultimately to maximize safety, independence, and minimize falls risk and caregiver burden. Recommend HHOT if indicated to ensure safety with ADLs/IADLs as pt is unsure of getting a caregiver in the home to assist at this time.          If plan is discharge home, recommend the following:   A little help with walking and/or transfers;A little help with bathing/dressing/bathroom;Help with stairs or ramp for entrance;Assistance with cooking/housework     Functional Status Assessment   Patient has had a recent decline in their functional status and demonstrates the ability to make significant improvements in  function in a reasonable and predictable amount of time.     Equipment Recommendations   None recommended by OT (has needed equipment)     Recommendations for Other Services         Precautions/Restrictions   Precautions Precautions: Fall Recall of Precautions/Restrictions: Intact Restrictions Weight Bearing Restrictions Per Provider Order: Yes LLE Weight Bearing Per Provider Order: Weight bearing as tolerated     Mobility Bed Mobility Overal bed mobility: Needs Assistance Bed Mobility: Supine to Sit     Supine to sit: Supervision, Used rails     General bed mobility comments: increased time/effort    Transfers Overall transfer level: Needs assistance Equipment used: Rolling walker (2 wheels) Transfers: Sit to/from Stand Sit to Stand: Supervision           General transfer comment: supervision for STS, CGA for mobility to the bathroom and back      Balance Overall balance assessment: Needs assistance Sitting-balance support: Feet supported Sitting balance-Leahy Scale: Good     Standing balance support: Bilateral upper extremity supported, During functional activity Standing balance-Leahy Scale: Good Standing balance comment: RW use                           ADL either performed or assessed with clinical judgement   ADL Overall ADL's : Needs assistance/impaired     Grooming: Wash/dry hands;Supervision/safety;Standing;Contact guard assist           Upper Body Dressing : Set up;Sitting   Lower Body Dressing: Minimal assistance;Sitting/lateral leans;Sit to/from stand Lower Body Dressing Details (indicate cue type and  reason): to don socks Toilet Transfer: Contact guard assist;Rolling walker (2 wheels);Grab bars;Regular Toilet   Toileting- Clothing Manipulation and Hygiene: Supervision/safety;Sitting/lateral lean       Functional mobility during ADLs: Contact guard assist;Rolling walker (2 wheels)       Vision          Perception         Praxis         Pertinent Vitals/Pain Pain Assessment Pain Assessment: No/denies pain     Extremity/Trunk Assessment Upper Extremity Assessment Upper Extremity Assessment: Overall WFL for tasks assessed   Lower Extremity Assessment Lower Extremity Assessment: Generalized weakness LLE Deficits / Details: Pt able to perform Ind LLE SLR without extensor lag, no KI required LLE: Unable to fully assess due to pain LLE Sensation: WNL LLE Coordination: WNL       Communication Communication Communication: No apparent difficulties   Cognition Arousal: Alert Behavior During Therapy: WFL for tasks assessed/performed Cognition: No apparent impairments                               Following commands: Intact       Cueing  General Comments   Cueing Techniques: Verbal cues      Exercises Other Exercises Other Exercises: Edu on role of OT in acute setting, use of AE/AD/DME, home modifications, polar care and compression stockings.   Shoulder Instructions      Home Living Family/patient expects to be discharged to:: Private residence Living Arrangements: Alone Available Help at Discharge: Family;Available PRN/intermittently (niece can stay for one night) Type of Home: House Home Access: Stairs to enter [employer] of Steps: 1 with a post Entrance Stairs-Rails: None Home Layout: One level     Bathroom Shower/Tub: Producer, television/film/video: Standard     Home Equipment: Grab bars - tub/shower;Shower seat;Cane - single point;Rollator (4 wheels)   Additional Comments: niece and nephew that can check in-niece will stay her first night home and can stay more if needed/check in or pt reports she can get someone to stay if needed      Prior Functioning/Environment Prior Level of Function : Independent/Modified Independent;Driving             Mobility Comments: Mod Ind amb with a SPC community distances ADLs  Comments: Ind with ADLs    OT Problem List: Decreased strength   OT Treatment/Interventions: Self-care/ADL training;Balance training;Therapeutic exercise;Therapeutic activities;Energy conservation;Patient/family education;DME and/or AE instruction      OT Goals(Current goals can be found in the care plan section)   Acute Rehab OT Goals Patient Stated Goal: return home OT Goal Formulation: With patient Time For Goal Achievement: 11/12/23 Potential to Achieve Goals: Good ADL Goals Pt Will Perform Lower Body Bathing: with modified independence;sitting/lateral leans;sit to/from stand Pt Will Perform Lower Body Dressing: with modified independence;sit to/from stand;sitting/lateral leans Pt Will Transfer to Toilet: with modified independence;ambulating;regular height toilet;bedside commode;grab bars Pt Will Perform Toileting - Clothing Manipulation and hygiene: with modified independence;sit to/from stand;sitting/lateral leans   OT Frequency:  Min 2X/week    Co-evaluation              AM-PAC OT "6 Clicks" Daily Activity     Outcome Measure Help from another person eating meals?: None Help from another person taking care of personal grooming?: None Help from another person toileting, which includes using toliet, bedpan, or urinal?: A Little Help from another person bathing (including washing, rinsing,  drying)?: A Little Help from another person to put on and taking off regular upper body clothing?: None Help from another person to put on and taking off regular lower body clothing?: A Little 6 Click Score: 21   End of Session Equipment Utilized During Treatment: Gait belt;Rolling walker (2 wheels) Nurse Communication: Mobility status  Activity Tolerance: Patient tolerated treatment well Patient left: in chair;with call bell/phone within reach;with SCD's reapplied  OT Visit Diagnosis: Other abnormalities of gait and mobility (R26.89)                Time: 2956-2130 OT Time  Calculation (min): 42 min Charges:  OT General Charges $OT Visit: 1 Visit OT Evaluation $OT Eval Moderate Complexity: 1 Mod OT Treatments $Self Care/Home Management : 23-37 mins  Ranier Coach, OTR/L 10/29/23, 1:10 PM  Quantavia Frith E Milton Sagona 10/29/2023, 1:03 PM

## 2023-10-29 NOTE — Plan of Care (Signed)
  Problem: Activity: Goal: Risk for activity intolerance will decrease Outcome: Progressing   Problem: Coping: Goal: Level of anxiety will decrease Outcome: Progressing   Problem: Activity: Goal: Ability to avoid complications of mobility impairment will improve Outcome: Progressing

## 2023-10-29 NOTE — TOC Transition Note (Signed)
 Transition of Care [facility]) - Discharge Note   Patient Details  Name: [name] [name] MRN: [id number] Date of Birth: [birth year]  [facility] CM/SW Contact:  Alexandra Ice, RN Phone Number: 10/29/2023, 9:47 AM   Clinical Narrative:    Patient to discharge home with home health. BSC and RW delivered by [employer] to bedside. [facility] set up by surgeon's office prior to surgery   Barriers to Discharge: Barriers Resolved   Patient Goals and CMS Choice Patient states their goals for this hospitalization and ongoing recovery are:: get better CMS Medicare.gov Compare Post Acute Care list provided to:: Patient Choice offered to / list presented to : Patient  Expected Discharge Plan and Services           Expected Discharge Date: 10/29/23               DME Arranged: Bedside commode, Walker rolling DME Agency: [facility] Date DME Agency Contacted: 10/29/23 Time DME Agency Contacted: [phone] Representative spoke with at DME Agency: Sam Creighton HH Arranged: PT, OT HH Agency: [facility] Date [facility] Agency Contacted: 10/29/23 Time HH Agency Contacted: [phone] Representative spoke with at [facility] Agency: Loetta Ringer  Prior Living Arrangements/Services                       Activities of Daily Living   ADL Screening (condition at time of admission) Independently performs ADLs?: Yes (appropriate for developmental age) Is the patient deaf or have difficulty hearing?: No Does the patient have difficulty seeing, even when wearing glasses/contacts?: No Does the patient have difficulty concentrating, remembering, or making decisions?: No  Permission Sought/Granted                  Emotional Assessment              Admission diagnosis:  Primary osteoarthritis of left knee [M17.12] History of total knee arthroplasty, left [Z96.652] Patient Active Problem List   Diagnosis Date Noted   History of total knee arthroplasty, left 10/28/2023   Malignant neoplasm of  ovary (HCC) 10/27/2023   Hyperglycemia 08/23/2023   Shingles 08/06/2023   History of colectomy 06/02/2023   History of hysterectomy 06/02/2023   History of tonsillectomy 06/02/2023   Status post total right knee replacement 06/02/2023   Pre-op evaluation 10/05/2022   Breast cancer (HCC) 08/28/2022   Change in bowel movement 07/22/2022   Arthralgia of both knees 06/14/2022   Personal history of breast cancer 06/11/2022   Hypertension, essential 06/20/2021   B12 deficiency 06/19/2021   Anemia 03/09/2021   Sleep disturbance 09/27/2020   Incontinence of urine 07/30/2020   Knee pain 07/30/2020   Nocturia 07/29/2020   Port-A-Cath in place 10/21/2019   Genetic testing 01/16/2019   Family history of breast cancer    Family history of ovarian cancer    Family history of prostate cancer    Family history of uterine cancer    Personal history of ovarian cancer    Decreased cardiac ejection fraction 11/19/2018   Goals of care, counseling/discussion 11/19/2018   Chemotherapy-induced neuropathy (HCC) 07/29/2018   Family history of cancer 07/29/2018   Encounter for antineoplastic chemotherapy 07/22/2018   Encounter for antineoplastic immunotherapy 07/22/2018   Carcinoma of upper-outer quadrant of right breast in female, estrogen receptor positive (HCC) 04/29/2018   Vitamin D  deficiency 01/21/2018   Lower extremity edema 01/21/2018   Osteoporosis 09/18/2017   Murmur, cardiac 06/13/2017   Primary osteoarthritis of left knee 04/10/2017  Pelvic abscess in female 03/08/2016   Fistula 03/08/2016   Abdominal pain 07/07/2015   Health care maintenance 09/16/2014   Vaginal discharge 07/15/2014   Stress 02/14/2014   Elevated blood pressure reading 11/03/2012   Hypercholesterolemia 11/03/2012   History of ovarian cancer 11/03/2012   Diverticulitis 11/03/2012   History of colonic polyps 11/03/2012   PCP:  Dellar Fenton, MD Pharmacy:   [facility] [phone] - [place], [place] - [address] [place] [address] Phone: [phone] Fax: [phone]     Social Determinants of Health (SDOH) Interventions    Readmission Risk Interventions     No data to display            Final next level of care: Home w Home Health Services Barriers to Discharge: Barriers Resolved   Patient Goals and CMS Choice Patient states their goals for this hospitalization and ongoing recovery are:: get better CMS Medicare.gov Compare Post Acute Care list provided to:: Patient Choice offered to / list presented to : Patient      Discharge Placement                Patient to be transferred to facility by: Family Name of family member notified: Family Patient and family notified of of transfer: 10/29/23  Discharge Plan and Services Additional resources added to the After Visit Summary for                  DME Arranged: Bedside commode, Walker rolling DME Agency: [facility] Date DME Agency Contacted: 10/29/23 Time DME Agency Contacted: [phone] Representative spoke with at DME Agency: Sam Creighton HH Arranged: PT, OT [facility] Agency: [facility] Date [facility] Agency Contacted: 10/29/23 Time HH Agency Contacted: [phone] Representative spoke with at [facility] Agency: Loetta Ringer  Social Drivers of Health (SDOH) Interventions SDOH Screenings   Food Insecurity: No Food Insecurity (10/28/2023)  Housing: Low Risk  (10/28/2023)  Transportation Needs: No Transportation Needs (10/28/2023)  Utilities: Not At Risk (10/28/2023)  Alcohol Screen: Low Risk  (01/01/2023)  Depression (PHQ2-9): Low Risk  (10/18/2023)  Financial Resource Strain: Low Risk  (01/01/2023)  Physical Activity: Inactive (01/01/2023)  Social Connections: Moderately Integrated (10/28/2023)  Stress: No Stress Concern Present (01/01/2023)  Tobacco Use: Low Risk  (10/28/2023)  Health Literacy: Adequate Health Literacy (01/01/2023)     Readmission Risk Interventions     No data to display

## 2023-10-29 NOTE — Evaluation (Signed)
 Physical Therapy Evaluation Patient Details Name: [name] [name] MRN: [id number] DOB: December 10, 1933 Today's Date: 10/29/2023  History of Present Illness  Pt is an 89 yo F diagnosed with degenerative arthrosis of the left knee and is s/p elective L TKA.  PMH includes HTN, HLD, ovarian cancer, colostomy, breast cancer, and glaucoma.  Clinical Impression  Pt was pleasant and motivated to participate during the session and put forth good effort throughout. Pt required no physical assistance with transfers from various surfaces but did require min cuing for proper sequencing.  Pt initially ambulated with step-to pattern that quickly progressed to beginning step-through pattern with decreased LLE stance time and was steady throughout without overt LOB.  Pt able to ascend backwards and descend forwards one step x 4 with a RW and was steady with very good eccentric and concentric control but required frequent cuing for proper sequencing with poor carryover initially that improved to fair with practice.  Pt reported no adverse symptoms during the session other than L knee pain with SpO2 and HR WNL throughout on room air.  Pt will benefit from continued PT services upon discharge to safely address deficits listed in patient problem list for decreased caregiver assistance and eventual return to PLOF.           If plan is discharge home, recommend the following: A little help with walking and/or transfers;A little help with bathing/dressing/bathroom;Assistance with cooking/housework;Assist for transportation;Help with stairs or ramp for entrance   Can travel by private vehicle        Equipment Recommendations Rolling walker (2 wheels);BSC/3in1  Recommendations for Other Services       Functional Status Assessment Patient has had a recent decline in their functional status and demonstrates the ability to make significant improvements in function in a reasonable and predictable amount of time.      Precautions / Restrictions Precautions Precautions: Fall Restrictions Weight Bearing Restrictions Per Provider Order: Yes LLE Weight Bearing Per Provider Order: Weight bearing as tolerated      Mobility  Bed Mobility               General bed mobility comments: NT, pt in recliner pre-post session    Transfers Overall transfer level: Needs assistance Equipment used: Rolling walker (2 wheels) Transfers: Sit to/from Stand Sit to Stand: Supervision           General transfer comment: Good eccentric and concentric control and stability with min verbal cues for sequencing    Ambulation/Gait Ambulation/Gait assistance: Contact guard assist Gait Distance (Feet): 125 Feet Assistive device: Rolling walker (2 wheels) Gait Pattern/deviations: Step-to pattern, Step-through pattern, Decreased step length - right, Decreased stance time - left Gait velocity: decreased     General Gait Details: Pt initially ambulated with step-to pattern that quickly progressed to beginning step-through pattern with decreased LLE stance time, steady throughout without overt LOB  Stairs Stairs: Yes Stairs assistance: Contact guard assist Stair Management: With walker, Backwards, Forwards, Step to pattern Number of Stairs: 1 General stair comments: Pt able to ascend backwards and descend forwards 1 step x 4 with frequent multi-modal cuing for proper sequencing; good eccentric and concentric control and stability throughout  Wheelchair Mobility     Tilt Bed    Modified Rankin (Stroke Patients Only)       Balance Overall balance assessment: Needs assistance   Sitting balance-Leahy Scale: Good     Standing balance support: Bilateral upper extremity supported, During functional activity Standing balance-Leahy Scale: Good  Pertinent Vitals/Pain Pain Assessment Pain Assessment: 0-10 Pain Score: 5  Pain Location: L knee Pain Descriptors /  Indicators: Sore Pain Intervention(s): Premedicated before session, Repositioned, Monitored during session, Ice applied    Home Living Family/patient expects to be discharged to:: Private residence Living Arrangements: Alone Available Help at Discharge: Family;Available PRN/intermittently (niece can stay for one night) Type of Home: House Home Access: Stairs to enter Entrance Stairs-Rails: None Entrance Stairs-Number of Steps: 1 with a post   Home Layout: One level Home Equipment: Grab bars - tub/shower;Shower seat;Cane - single point;Rollator (4 wheels) Additional Comments: niece and nephew that can check in-niece will stay her first night home and can stay more if needed/check in or pt reports she can get someone to stay if needed    Prior Function Prior Level of Function : Independent/Modified Independent;Driving             Mobility Comments: Mod Ind amb with a SPC community distances ADLs Comments: Ind with ADLs     Extremity/Trunk Assessment   Upper Extremity Assessment Upper Extremity Assessment: Defer to OT evaluation    Lower Extremity Assessment Lower Extremity Assessment: Generalized weakness;LLE deficits/detail LLE Deficits / Details: Pt able to perform Ind LLE SLR without extensor lag, no KI required LLE: Unable to fully assess due to pain LLE Sensation: WNL LLE Coordination: WNL       Communication   Communication Communication: No apparent difficulties    Cognition Arousal: Alert Behavior During Therapy: WFL for tasks assessed/performed   PT - Cognitive impairments: No apparent impairments                         Following commands: Intact       Cueing Cueing Techniques: Verbal cues, Visual cues, Tactile cues     General Comments      Exercises Total Joint Exercises Quad Sets: AROM, Strengthening, Left, 10 reps Straight Leg Raises: AROM, Strengthening, Left, 5 reps Long Arc Quad: AROM, Strengthening, Left, 10 reps Knee  Flexion: AROM, Strengthening, Left, 10 reps Goniometric ROM: L knee AROM: 3-92 deg Marching in Standing: AROM, Strengthening, Both, 5 reps, Standing Other Exercises Other Exercises: Car transfer sequencing education Other Exercises: LLE positioning education to promote L knee ext PROM Other Exercises: HEP education and review per handout   Assessment/Plan    PT Assessment Patient needs continued PT services  PT Problem List Decreased strength;Decreased range of motion;Decreased activity tolerance;Decreased balance;Decreased mobility;Decreased knowledge of use of DME;Pain       PT Treatment Interventions DME instruction;Gait training;Functional mobility training;Stair training;Therapeutic activities;Therapeutic exercise;Balance training;Patient/family education    PT Goals (Current goals can be found in the Care Plan section)  Acute Rehab PT Goals Patient Stated Goal: To walk better without pain PT Goal Formulation: With patient Time For Goal Achievement: 11/11/23 Potential to Achieve Goals: Good    Frequency BID     Co-evaluation               AM-PAC PT "6 Clicks" Mobility  Outcome Measure Help needed turning from your back to your side while in a flat bed without using bedrails?: A Little Help needed moving from lying on your back to sitting on the side of a flat bed without using bedrails?: A Little Help needed moving to and from a bed to a chair (including a wheelchair)?: A Little Help needed standing up from a chair using your arms (e.g., wheelchair or bedside chair)?: A Little Help needed to  walk in hospital room?: A Little Help needed climbing 3-5 steps with a railing? : A Little 6 Click Score: 18    End of Session Equipment Utilized During Treatment: Gait belt Activity Tolerance: Patient tolerated treatment well Patient left: in chair;with call bell/phone within reach;with SCD's reapplied;Other (comment) (polar care donned to L knee) Nurse Communication:  Mobility status;Weight bearing status PT Visit Diagnosis: Other abnormalities of gait and mobility (R26.89);Muscle weakness (generalized) (M62.81);Pain Pain - Right/Left: Left Pain - part of body: Knee    Time: 1020-1101 PT Time Calculation (min) (ACUTE ONLY): 41 min   Charges:   PT Evaluation $PT Eval Moderate Complexity: 1 Mod PT Treatments $Gait Training: 8-22 mins $Therapeutic Exercise: 8-22 mins PT General Charges $$ ACUTE PT VISIT: 1 Visit       D. Scott Marven Veley PT, DPT 10/29/23, 11:55 AM

## 2024-01-03 ENCOUNTER — Ambulatory Visit: Payer: Medicare HMO | Admitting: *Deleted

## 2024-01-03 ENCOUNTER — Telehealth: Payer: Self-pay | Admitting: Internal Medicine

## 2024-01-03 VITALS — Ht 63.0 in | Wt 160.0 lb

## 2024-01-03 DIAGNOSIS — Z0000 Encounter for general adult medical examination without abnormal findings: Secondary | ICD-10-CM | POA: Diagnosis not present

## 2024-01-03 NOTE — Progress Notes (Signed)
 Subjective:   [name] [name] is a 89 y.o. who presents for a Medicare Wellness preventive visit.  As a reminder, Annual Wellness Visits don't include a physical exam, and some assessments may be limited, especially if this visit is performed virtually. We may recommend an in-person follow-up visit with your provider if needed.  Visit Complete: Virtual I connected with  [name] [name] on 01/03/24 by a audio enabled telemedicine application and verified that I am speaking with the correct person using two identifiers.  Patient Location: Home  Provider Location: Home Office  I discussed the limitations of evaluation and management by telemedicine. The patient expressed understanding and agreed to proceed.  Vital Signs: Because this visit was a virtual/telehealth visit, some criteria may be missing or patient reported. Any vitals not documented were not able to be obtained and vitals that have been documented are patient reported.  VideoDeclined- This patient declined Librarian, academic. Therefore the visit was completed with audio only.  Persons Participating in Visit: Patient.  AWV Questionnaire: No: Patient Medicare AWV questionnaire was not completed prior to this visit.  Cardiac Risk Factors include: advanced age (>30men, >80 women);dyslipidemia;hypertension     Objective:    Today's Vitals   01/03/24 0813  Weight: 160 lb (72.6 kg)  Height: 5' 3 (1.6 m)   Body mass index is 28.34 kg/m.     01/03/2024    8:28 AM 10/28/2023   10:40 AM 10/25/2023    1:43 PM 10/11/2023   10:28 AM 04/12/2023   11:32 AM 01/01/2023    8:43 AM 08/28/2022   11:44 AM  Advanced Directives  Does Patient Have a Medical Advance Directive? No No No No No No No  Does patient want to make changes to medical advance directive?     No - Patient declined  No - Patient declined  Would patient like information on creating a medical advance directive? No - Patient declined No -  Patient declined No - Patient declined  No - Patient declined  No - Patient declined    Current Medications (verified) Outpatient Encounter Medications as of 01/03/2024  Medication Sig   acetaminophen  (TYLENOL ) 650 MG CR tablet Take 650 mg by mouth every 8 (eight) hours as needed for pain.   amLODipine  (NORVASC ) 2.5 MG tablet Take 1 tablet (2.5 mg total) by mouth daily.   Calcium Carb-Cholecalciferol (CALTRATE 600+D3 SOFT) 600-20 MG-MCG CHEW Chew 1 tablet by mouth in the morning and at bedtime.   celecoxib  (CELEBREX ) 200 MG capsule Take 1 capsule (200 mg total) by mouth 2 (two) times daily.   denosumab  (PROLIA ) 60 MG/ML SOSY injection Inject 60 mg into the skin every 6 (six) months.   gabapentin  (NEURONTIN ) 100 MG capsule Take 1 capsule (100 mg total) by mouth 3 (three) times daily.   Multiple Vitamin (MULTIVITAMIN WITH MINERALS) TABS tablet Take 1 tablet by mouth in the morning.   aspirin  81 MG chewable tablet Chew 1 tablet (81 mg total) by mouth 2 (two) times daily. (Patient not taking: Reported on 01/03/2024)   oxyCODONE  (OXY IR/ROXICODONE ) 5 MG immediate release tablet Take 1 tablet (5 mg total) by mouth every 4 (four) hours as needed for moderate pain (pain score 4-6) (pain score 4-6). (Patient not taking: Reported on 01/03/2024)   traMADol  (ULTRAM ) 50 MG tablet Take 1-2 tablets (50-100 mg total) by mouth every 4 (four) hours as needed for moderate pain (pain score 4-6). (Patient not taking: Reported on 01/03/2024)   Facility-Administered  Encounter Medications as of 01/03/2024  Medication   [START ON 04/20/2024] denosumab  (PROLIA ) injection 60 mg   sodium chloride  0.9 % 50 mL with famotidine  (PEPCID ) 20 mg infusion   sodium chloride  flush (NS) 0.9 % injection 10 mL    Allergies (verified) Patient has no known allergies.   History: Past Medical History:  Diagnosis Date   Breast cancer (HCC)    Diverticulitis of sigmoid colon    Endometriosis    Family history of breast cancer     Family history of ovarian cancer    Family history of prostate cancer    Family history of uterine cancer    Fibrocystic breast disease    Glaucoma    H/O urinary incontinence    History of chicken pox    History of colon polyps 2014   Hyperlipidemia    Hypertension    Ovarian cancer (HCC) 2002   Chemo tx's @ [facility] with Total Hysterectomy. Dr Janit   Personal history of chemotherapy    ovarian cancer   Personal history of ovarian cancer    Personal history of radiation therapy    Port-A-Cath in place    Sinusitis    Tuberculosis    positve skin test   Past Surgical History:  Procedure Laterality Date   ABDOMINAL HYSTERECTOMY  2003   complete Dr Janit   ABSCESS DRAINAGE  2017   right buttock [facility]   APPENDECTOMY  2003   BREAST EXCISIONAL BIOPSY Left 1993   excisional bx. negative results   BREAST LUMPECTOMY Right 03/18/2018   done at [facility] breast ca rad and chemo Invasive ductal carcinoma with focal squamous differentiation, Nottingham grade 3, is identified forming a 6 mm mass, associated with high-grade ductal carcinoma in situ, radial scar and biopsy site changes.   BREAST SURGERY Left 1982   papilloma removal   COLONOSCOPY  2006, 2014   Dr Viktoria    COLONOSCOPY N/A 03/27/2016   Procedure: COLONOSCOPY;  Surgeon: Reyes LELON Cota, MD;  Location: [facility] ENDOSCOPY;  Service: Endoscopy;  Laterality: N/A;   COLOSTOMY REVERSAL  2014   at [facility] Dr Rock Sauger   KNEE ARTHROPLASTY Left 10/28/2023   Procedure: ARTHROPLASTY, KNEE, TOTAL, USING IMAGELESS COMPUTER-ASSISTED NAVIGATION;  Surgeon: Mardee Lynwood SQUIBB, MD;  Location: [facility] ORS;  Service: Orthopedics;  Laterality: Left;   KNEE SURGERY Right 10/2022   knee replacement   NASAL SINUS SURGERY     For fungal infection   NASAL SINUS SURGERY  8/05 8/06   OOPHORECTOMY     OSTOMY  05/22/2012   diverticulitis with abcess/ Dr Claudene   ovarian cancer     Exploratory lap   PORTA CATH INSERTION N/A 05/26/2018   Procedure: PORTA CATH  INSERTION;  Surgeon: Marea Selinda RAMAN, MD;  Location: [facility] INVASIVE CV LAB;  Service: Cardiovascular;  Laterality: N/A;   PORTA CATH REMOVAL N/A 11/12/2019   Procedure: PORTA CATH REMOVAL;  Surgeon: Marea Selinda RAMAN, MD;  Location: [facility] INVASIVE CV LAB;  Service: Cardiovascular;  Laterality: N/A;   TONSILLECTOMY AND ADENOIDECTOMY  1947   Family History  Problem Relation Age of Onset   Hypertension Mother    Coronary artery disease Mother    Heart disease Mother    Kidney disease Mother    Diabetes Mother    Cancer Mother        uterine cancer dx 73s   Diabetes Father    Kidney failure Father    Stroke Sister    Heart disease  Sister        Pace Maker   Dementia Sister    Diabetes Brother    Coronary artery disease Brother    Cancer Brother        bladder cancer   Cancer Brother        Prostate   Cancer Maternal Aunt 49       ovarian cancer   Breast cancer Cousin    Social History   Socioeconomic History   Marital status: Widowed    Spouse name: Not on file   Number of children: 0   Years of education: Not on file   Highest education level: Not on file  Occupational History   Not on file  Tobacco Use   Smoking status: Never   Smokeless tobacco: Never  Vaping Use   Vaping status: Never Used  Substance and Sexual Activity   Alcohol use: No    Alcohol/week: 0.0 standard drinks of alcohol   Drug use: Never   Sexual activity: Not Currently  Other Topics Concern   Not on file  Social History Narrative   Lives at home alone.   Social Drivers of Corporate investment banker Strain: Low Risk  (01/03/2024)   Overall Financial Resource Strain (CARDIA)    Difficulty of Paying Living Expenses: Not hard at all  Food Insecurity: No Food Insecurity (01/03/2024)   Hunger Vital Sign    Worried About Running Out of Food in the Last Year: Never true    Ran Out of Food in the Last Year: Never true  Transportation Needs: No Transportation Needs (01/03/2024)   PRAPARE - Therapist, art (Medical): No    Lack of Transportation (Non-Medical): No  Physical Activity: Inactive (01/03/2024)   Exercise Vital Sign    Days of Exercise per Week: 0 days    Minutes of Exercise per Session: 0 min  Stress: No Stress Concern Present (01/03/2024)   [employer] of Occupational Health - Occupational Stress Questionnaire    Feeling of Stress: Not at all  Social Connections: Moderately Integrated (01/03/2024)   Social Connection and Isolation Panel    Frequency of Communication with Friends and Family: More than three times a week    Frequency of Social Gatherings with Friends and Family: Three times a week    Attends Religious Services: More than 4 times per year    Active Member of Clubs or Organizations: Yes    Attends Banker Meetings: More than 4 times per year    Marital Status: Widowed    Tobacco Counseling Counseling given: Not Answered    Clinical Intake:  Pre-visit preparation completed: Yes  Pain : No/denies pain     BMI - recorded: 28.34 Nutritional Status: BMI 25 -29 Overweight Nutritional Risks: None Diabetes: No  Lab Results  Component Value Date   HGBA1C 5.6 09/06/2023   HGBA1C 5.7 10/04/2022     How often do you need to have someone help you when you read instructions, pamphlets, or other written materials from your doctor or pharmacy?: 1 - Never  Interpreter Needed?: No  Information entered by :: R. Pistol Kessenich LPN   Activities of Daily Living     01/03/2024    8:15 AM 10/28/2023    4:00 PM  In your present state of health, do you have any difficulty performing the following activities:  Hearing? 0   Vision? 0   Comment glasses   Difficulty concentrating or making decisions? 0  Walking or climbing stairs? 0   Dressing or bathing? 0   Doing errands, shopping? 0 0  Preparing Food and eating ? N   Using the Toilet? N   In the past six months, have you accidently leaked urine? Y   Do you have problems with  loss of bowel control? N   Managing your Medications? N   Managing your Finances? N   Housekeeping or managing your Housekeeping? N     Patient Care Team: Glendia Shad, MD as PCP - General (Internal Medicine) Maribeth Camellia MATSU, MD (Inactive) as Consulting Physician (Family Medicine) Dellie Louanne MATSU, MD (General Surgery) Glendia Shad, MD (Internal Medicine) Dessa, Reyes ORN, MD (General Surgery) Lenn Aran, MD as Referring Physician (Radiation Oncology) Jama, Cordella MATSU, MD as Consulting Physician (Vascular Surgery) Melanee Annah BROCKS, MD as Consulting Physician (Hematology and Oncology)  I have updated your Care Teams any recent Medical Services you may have received from other providers in the past year.     Assessment:   This is a routine wellness examination for Adelae.  Hearing/Vision screen Hearing Screening - Comments:: No issues Vision Screening - Comments:: glasses   Goals Addressed             This Visit's Progress    Patient Stated       Wants to improve and work on a healthy eating lifestyle       Depression Screen     01/03/2024    8:22 AM 10/18/2023    9:41 AM 08/23/2023    9:18 AM 01/01/2023    8:36 AM 07/19/2022    9:43 AM 05/07/2022    7:15 AM 01/16/2022   10:55 AM  PHQ 2/9 Scores  PHQ - 2 Score 0 0 0 0 0 1 0  PHQ- 9 Score 0 0  0       Fall Risk     01/03/2024    8:17 AM 10/18/2023    9:41 AM 08/23/2023    9:18 AM 01/01/2023    8:26 AM 07/19/2022    9:43 AM  Fall Risk   Falls in the past year? 0 0 0 1 0  Number falls in past yr: 0 0 0 1 0  Comment    trip on rug after surgery, no injury   Injury with Fall? 0 0 0 0 0  Risk for fall due to : No Fall Risks No Fall Risks No Fall Risks History of fall(s);Impaired mobility No Fall Risks  Follow up Falls evaluation completed;Falls prevention discussed Falls evaluation completed Falls evaluation completed Falls prevention discussed;Falls evaluation completed Falls evaluation completed     MEDICARE RISK AT HOME:  Medicare Risk at Home Any stairs in or around the home?: Yes If so, are there any without handrails?: Yes Home free of loose throw rugs in walkways, pet beds, electrical cords, etc?: Yes Adequate lighting in your home to reduce risk of falls?: Yes Life alert?: Yes Use of a cane, walker or w/c?: Yes (at times) Grab bars in the bathroom?: Yes Shower chair or bench in shower?: Yes Elevated toilet seat or a handicapped toilet?: Yes  TIMED UP AND GO:  Was the test performed?  No  Cognitive Function: 6CIT completed    08/29/2017   11:39 AM 02/08/2016    9:05 AM  MMSE - Mini Mental State Exam  Orientation to time 5 5   Orientation to Place 5 5   Registration 3 3   Attention/ Calculation 5 5  Recall 1 3   Language- name 2 objects 2 2   Language- repeat 1 1  Language- follow 3 step command 3 3   Language- read & follow direction 1 1   Write a sentence 1 1   Copy design 1 1   Total score 28 30      Data saved with a previous flowsheet row definition        01/03/2024    8:28 AM 01/01/2023    8:44 AM 11/23/2019   11:58 AM 11/19/2018    1:51 PM  6CIT Screen  What Year? 0 points 0 points 0 points 0 points  What month? 0 points 0 points 0 points 0 points  What time? 0 points 0 points  0 points  Count back from 20 0 points 0 points  0 points  Months in reverse 0 points 0 points 0 points 0 points  Repeat phrase 2 points 0 points  0 points  Total Score 2 points 0 points  0 points    Immunizations Immunization History  Administered Date(s) Administered   PFIZER(Purple Top)SARS-COV-2 Vaccination 07/07/2019, 07/28/2019, 04/15/2020, 11/01/2020    Screening Tests Health Maintenance  Topic Date Due   DTaP/Tdap/Td (1 - Tdap) Never done   Zoster Vaccines- Shingrix (1 of 2) Never done   Pneumococcal Vaccine: 50+ Years (1 of 1 - PCV) Never done   COVID-19 Vaccine (5 - 2024-25 season) 02/10/2023   Medicare Annual Wellness (AWV)  01/01/2024   INFLUENZA  VACCINE  01/10/2024   MAMMOGRAM  02/25/2024   DEXA SCAN  Completed   Hepatitis B Vaccines  Aged Out   HPV VACCINES  Aged Out   Meningococcal B Vaccine  Aged Out    Health Maintenance  Health Maintenance Due  Topic Date Due   DTaP/Tdap/Td (1 - Tdap) Never done   Zoster Vaccines- Shingrix (1 of 2) Never done   Pneumococcal Vaccine: 50+ Years (1 of 1 - PCV) Never done   COVID-19 Vaccine (5 - 2024-25 season) 02/10/2023   Medicare Annual Wellness (AWV)  01/01/2024   Health Maintenance Items Addressed: Patient declines vaccines but is considering getting the shingles vaccines after she gets over shingles that she has now.  Additional Screening:  Vision Screening: Recommended annual ophthalmology exams for early detection of glaucoma and other disorders of the eye.n Up to date [facility] Would you like a referral to an eye doctor? No    Dental Screening: Recommended annual dental exams for proper oral hygiene  Community Resource Referral / Chronic Care Management: CRR required this visit?  No   CCM required this visit?  No   Plan:    I have personally reviewed and noted the following in the patient's chart:   Medical and social history Use of alcohol, tobacco or illicit drugs  Current medications and supplements including opioid prescriptions. Patient is not currently taking opioid prescriptions. Functional ability and status Nutritional status Physical activity Advanced directives List of other physicians Hospitalizations, surgeries, and ER visits in previous 12 months Vitals Screenings to include cognitive, depression, and falls Referrals and appointments  In addition, I have reviewed and discussed with patient certain preventive protocols, quality metrics, and best practice recommendations. A written personalized care plan for preventive services as well as general preventive health recommendations were provided to patient.   Angeline Fredericks, LPN   [id number]   After  Visit Summary: (MyChart) Due to this being a telephonic visit, the after visit summary with patients personalized plan was  offered to patient via MyChart   Notes: Nothing significant to report at this time.

## 2024-01-03 NOTE — Patient Instructions (Signed)
 [name] [name] , Thank you for taking time out of your busy schedule to complete your Annual Wellness Visit with me. I enjoyed our conversation and look forward to speaking with you again next year. I, as well as your care team,  appreciate your ongoing commitment to your health goals. Please review the following plan we discussed and let me know if I can assist you in the future. Your Game plan/ To Do List    Referrals: If you haven't heard from the office you've been referred to, please reach out to them at the phone provided.  Consider updating your vaccines. Follow up Visits: Next Medicare AWV with our clinical staff: 01/04/25 @ 8:10   Have you seen your provider in the last 6 months (3 months if uncontrolled diabetes)? Yes Next Office Visit with your provider: The office will call and schedule you an appointment  Clinician Recommendations:  Aim for 30 minutes of exercise or brisk walking, 6-8 glasses of water, and 5 servings of fruits and vegetables each day.       This is a list of the screening recommended for you and due dates:  Health Maintenance  Topic Date Due   DTaP/Tdap/Td vaccine (1 - Tdap) Never done   Zoster (Shingles) Vaccine (1 of 2) Never done   Pneumococcal Vaccine for age over 107 (1 of 1 - PCV) Never done   COVID-19 Vaccine (5 - 2024-25 season) 02/10/2023   Flu Shot  01/10/2024   Mammogram  02/25/2024   Medicare Annual Wellness Visit  01/02/2025   DEXA scan (bone density measurement)  Completed   Hepatitis B Vaccine  Aged Out   HPV Vaccine  Aged Out   Meningitis B Vaccine  Aged Out    Advanced directives: (Declined) Advance directive discussed with you today. Even though you declined this today, please call our office should you change your mind, and we can give you the proper paperwork for you to fill out. Advance Care Planning is important because it:  [x]  Makes sure you receive the medical care that is consistent with your values, goals, and preferences  [x]  It  provides guidance to your family and loved ones and reduces their decisional burden about whether or not they are making the right decisions based on your wishes.

## 2024-01-03 NOTE — Telephone Encounter (Signed)
 Patient had a AWV today. Regine sent a note to front office about patient needing a CPE and labs. Patient was called and scheduled for a physical on 03/12/2024 and labs 03/10/2024. Please order labs.  Thank you

## 2024-03-10 ENCOUNTER — Ambulatory Visit: Payer: Self-pay | Admitting: Internal Medicine

## 2024-03-10 ENCOUNTER — Other Ambulatory Visit (INDEPENDENT_AMBULATORY_CARE_PROVIDER_SITE_OTHER)

## 2024-03-10 DIAGNOSIS — I10 Essential (primary) hypertension: Secondary | ICD-10-CM | POA: Diagnosis not present

## 2024-03-10 DIAGNOSIS — E7800 Pure hypercholesterolemia, unspecified: Secondary | ICD-10-CM | POA: Diagnosis not present

## 2024-03-10 DIAGNOSIS — R739 Hyperglycemia, unspecified: Secondary | ICD-10-CM | POA: Diagnosis not present

## 2024-03-10 LAB — LIPID PANEL
Cholesterol: 165 mg/dL (ref 0–200)
HDL: 59 mg/dL (ref >39.00)
LDL Cholesterol: 80 mg/dL (ref 0–99)
NonHDL: 105.78
Total CHOL/HDL Ratio: 3
Triglycerides: 127 mg/dL (ref 0.0–149.0)
VLDL: 25.4 mg/dL (ref 0.0–40.0)

## 2024-03-10 LAB — HEPATIC FUNCTION PANEL
ALT: 16 U/L (ref 0–35)
AST: 19 U/L (ref 0–37)
Albumin: 3.7 g/dL (ref 3.5–5.2)
Alkaline Phosphatase: 78 U/L (ref 39–117)
Bilirubin, Direct: 0.1 mg/dL (ref 0.0–0.3)
Total Bilirubin: 0.4 mg/dL (ref 0.2–1.2)
Total Protein: 5.8 g/dL — ABNORMAL LOW (ref 6.0–8.3)

## 2024-03-10 LAB — BASIC METABOLIC PANEL WITH GFR
BUN: 13 mg/dL (ref 6–23)
CO2: 30 meq/L (ref 19–32)
Calcium: 9.1 mg/dL (ref 8.4–10.5)
Chloride: 106 meq/L (ref 96–112)
Creatinine, Ser: 0.48 mg/dL (ref 0.40–1.20)
GFR: 84.83 mL/min (ref >60.00)
Glucose, Bld: 87 mg/dL (ref 70–99)
Potassium: 4.3 meq/L (ref 3.5–5.1)
Sodium: 143 meq/L (ref 135–145)

## 2024-03-10 LAB — HEMOGLOBIN A1C: Hgb A1c MFr Bld: 5.7 % (ref 4.6–6.5)

## 2024-03-12 ENCOUNTER — Ambulatory Visit: Admitting: Internal Medicine

## 2024-03-12 VITALS — BP 126/72 | HR 73 | Resp 16 | Ht 63.0 in | Wt 166.9 lb

## 2024-03-12 DIAGNOSIS — M810 Age-related osteoporosis without current pathological fracture: Secondary | ICD-10-CM

## 2024-03-12 DIAGNOSIS — G620 Drug-induced polyneuropathy: Secondary | ICD-10-CM

## 2024-03-12 DIAGNOSIS — D649 Anemia, unspecified: Secondary | ICD-10-CM | POA: Diagnosis not present

## 2024-03-12 DIAGNOSIS — Z8543 Personal history of malignant neoplasm of ovary: Secondary | ICD-10-CM

## 2024-03-12 DIAGNOSIS — R739 Hyperglycemia, unspecified: Secondary | ICD-10-CM

## 2024-03-12 DIAGNOSIS — I10 Essential (primary) hypertension: Secondary | ICD-10-CM

## 2024-03-12 DIAGNOSIS — Z0000 Encounter for general adult medical examination without abnormal findings: Secondary | ICD-10-CM

## 2024-03-12 DIAGNOSIS — Z860100 Personal history of colon polyps, unspecified: Secondary | ICD-10-CM

## 2024-03-12 DIAGNOSIS — Z853 Personal history of malignant neoplasm of breast: Secondary | ICD-10-CM

## 2024-03-12 DIAGNOSIS — E559 Vitamin D deficiency, unspecified: Secondary | ICD-10-CM | POA: Diagnosis not present

## 2024-03-12 DIAGNOSIS — E7800 Pure hypercholesterolemia, unspecified: Secondary | ICD-10-CM

## 2024-03-12 DIAGNOSIS — T451X5A Adverse effect of antineoplastic and immunosuppressive drugs, initial encounter: Secondary | ICD-10-CM

## 2024-03-12 NOTE — Assessment & Plan Note (Signed)
 Physical today 03/12/24.  Colonoscopy 03/2016.  Mammogram 02/26/23 - Birads I.  Continue prolia .

## 2024-03-12 NOTE — Patient Instructions (Signed)
 YOUR MAMMOGRAM IS DUE, PLEASE CALL AND GET THIS SCHEDULED! [facility] - call [phone]

## 2024-03-12 NOTE — Progress Notes (Signed)
 Subjective:    Patient ID: [name] [name], female    DOB: 11-13-1933, 89 y.o.   MRN: [id number]  Patient here for  Chief Complaint  Patient presents with   Annual Exam    HPI Here for a physical exam. Is s/p left total knee arthroplasty 10/28/23. Had f/u with Dr Mardee 01/23/24. Doing well. Went to therapy. Knee is doing well. Had f/u with Dr Melanee 10/11/23 - f/u regarding breast cancer s/p lumpectomy and adjuvant radiation therapy and 5 years of endocrine therapy. and history of ovarian cancer. Stable. Saw endocrinology 09/30/23. Recommended to continue prolia  for another 1-2 years and then f/u with bone density. If continues to decline, recommended evenity. Overall she feels she is doing well. Tries to stay active. No chest pain or sob reported. Breathing stable.    Past Medical History:  Diagnosis Date   Breast cancer (HCC)    Diverticulitis of sigmoid colon    Endometriosis    Family history of breast cancer    Family history of ovarian cancer    Family history of prostate cancer    Family history of uterine cancer    Fibrocystic breast disease    Glaucoma    H/O urinary incontinence    History of chicken pox    History of colon polyps 2014   Hyperlipidemia    Hypertension    Ovarian cancer (HCC) 2002   Chemo tx's @ [facility] with Total Hysterectomy. Dr Janit   Personal history of chemotherapy    ovarian cancer   Personal history of ovarian cancer    Personal history of radiation therapy    Port-A-Cath in place    Sinusitis    Tuberculosis    positve skin test   Past Surgical History:  Procedure Laterality Date   ABDOMINAL HYSTERECTOMY  2003   complete Dr Janit   ABSCESS DRAINAGE  2017   right buttock at [facility]   APPENDECTOMY  2003   BREAST EXCISIONAL BIOPSY Left 1993   excisional bx. negative results   BREAST LUMPECTOMY Right 03/18/2018   done at [facility] breast ca rad and chemo Invasive ductal carcinoma with focal squamous differentiation, Nottingham grade 3, is identified  forming a 6 mm mass, associated with high-grade ductal carcinoma in situ, radial scar and biopsy site changes.   BREAST SURGERY Left 1982   papilloma removal   COLONOSCOPY  2006, 2014   Dr Viktoria    COLONOSCOPY N/A 03/27/2016   Procedure: COLONOSCOPY;  Surgeon: Reyes LELON Cota, MD;  Location: [facility] ENDOSCOPY;  Service: Endoscopy;  Laterality: N/A;   COLOSTOMY REVERSAL  2014   at [facility] Dr Rock Sauger   KNEE ARTHROPLASTY Left 10/28/2023   Procedure: ARTHROPLASTY, KNEE, TOTAL, USING IMAGELESS COMPUTER-ASSISTED NAVIGATION;  Surgeon: Mardee Lynwood SQUIBB, MD;  Location: [facility] ORS;  Service: Orthopedics;  Laterality: Left;   KNEE SURGERY Right 10/2022   knee replacement   NASAL SINUS SURGERY     For fungal infection   NASAL SINUS SURGERY  8/05 8/06   OOPHORECTOMY     OSTOMY  05/22/2012   diverticulitis with abcess/ Dr Claudene   ovarian cancer     Exploratory lap   PORTA CATH INSERTION N/A 05/26/2018   Procedure: PORTA CATH INSERTION;  Surgeon: Marea Selinda RAMAN, MD;  Location: [facility] INVASIVE CV LAB;  Service: Cardiovascular;  Laterality: N/A;   PORTA CATH REMOVAL N/A 11/12/2019   Procedure: PORTA CATH REMOVAL;  Surgeon: Marea Selinda RAMAN, MD;  Location: [facility] INVASIVE CV LAB;  Service: Cardiovascular;  Laterality: N/A;   TONSILLECTOMY AND ADENOIDECTOMY  1947   Family History  Problem Relation Age of Onset   Hypertension Mother    Coronary artery disease Mother    Heart disease Mother    Kidney disease Mother    Diabetes Mother    Cancer Mother        uterine cancer dx 22s   Diabetes Father    Kidney failure Father    Stroke Sister    Heart disease Sister        Visual merchandiser   Dementia Sister    Diabetes Brother    Coronary artery disease Brother    Cancer Brother        bladder cancer   Cancer Brother        Prostate   Cancer Maternal Aunt 49       ovarian cancer   Breast cancer Cousin    Social History   Socioeconomic History   Marital status: Widowed    Spouse name: Not on file    Number of children: 0   Years of education: Not on file   Highest education level: Not on file  Occupational History   Not on file  Tobacco Use   Smoking status: Never   Smokeless tobacco: Never  Vaping Use   Vaping status: Never Used  Substance and Sexual Activity   Alcohol use: No    Alcohol/week: 0.0 standard drinks of alcohol   Drug use: Never   Sexual activity: Not Currently  Other Topics Concern   Not on file  Social History Narrative   Lives at home alone.   Social Drivers of Corporate investment banker Strain: Low Risk  (01/03/2024)   Overall Financial Resource Strain (CARDIA)    Difficulty of Paying Living Expenses: Not hard at all  Food Insecurity: No Food Insecurity (01/03/2024)   Hunger Vital Sign    Worried About Running Out of Food in the Last Year: Never true    Ran Out of Food in the Last Year: Never true  Transportation Needs: No Transportation Needs (01/03/2024)   PRAPARE - Administrator, Civil Service (Medical): No    Lack of Transportation (Non-Medical): No  Physical Activity: Inactive (01/03/2024)   Exercise Vital Sign    Days of Exercise per Week: 0 days    Minutes of Exercise per Session: 0 min  Stress: No Stress Concern Present (01/03/2024)   [employer] of Occupational Health - Occupational Stress Questionnaire    Feeling of Stress: Not at all  Social Connections: Moderately Integrated (01/03/2024)   Social Connection and Isolation Panel    Frequency of Communication with Friends and Family: More than three times a week    Frequency of Social Gatherings with Friends and Family: Three times a week    Attends Religious Services: More than 4 times per year    Active Member of Clubs or Organizations: Yes    Attends Banker Meetings: More than 4 times per year    Marital Status: Widowed     Review of Systems  Constitutional:  Negative for appetite change and unexpected weight change.  HENT:  Negative for congestion,  sinus pressure and sore throat.   Eyes:  Negative for pain and visual disturbance.  Respiratory:  Negative for cough, chest tightness and shortness of breath.   Cardiovascular:  Negative for chest pain, palpitations and leg swelling.  Gastrointestinal:  Negative for abdominal pain, diarrhea, nausea and  vomiting.  Genitourinary:  Negative for difficulty urinating and dysuria.  Musculoskeletal:  Negative for joint swelling and myalgias.  Skin:  Negative for color change and rash.  Neurological:  Negative for dizziness and headaches.  Hematological:  Negative for adenopathy. Does not bruise/bleed easily.  Psychiatric/Behavioral:  Negative for agitation and dysphoric mood.        Objective:     BP 126/72   Pulse 73   Resp 16   Ht 5' 3 (1.6 m)   Wt 166 lb 13.9 oz (75.7 kg)   SpO2 99%   BMI 29.56 kg/m  Wt Readings from Last 3 Encounters:  03/12/24 166 lb 13.9 oz (75.7 kg)  01/03/24 160 lb (72.6 kg)  10/28/23 160 lb (72.6 kg)    Physical Exam Vitals reviewed.  Constitutional:      General: She is not in acute distress.    Appearance: Normal appearance. She is well-developed.  HENT:     Head: Normocephalic and atraumatic.     Right Ear: External ear normal.     Left Ear: External ear normal.     Mouth/Throat:     Pharynx: No oropharyngeal exudate or posterior oropharyngeal erythema.  Eyes:     General: No scleral icterus.       Right eye: No discharge.        Left eye: No discharge.     Conjunctiva/sclera: Conjunctivae normal.  Neck:     Thyroid : No thyromegaly.  Cardiovascular:     Rate and Rhythm: Normal rate and regular rhythm.  Pulmonary:     Effort: No tachypnea, accessory muscle usage or respiratory distress.     Breath sounds: Normal breath sounds. No decreased breath sounds or wheezing.  Chest:  Breasts:    Right: No inverted nipple, mass, nipple discharge or tenderness (no axillary adenopathy).     Left: No inverted nipple, mass, nipple discharge or  tenderness (no axilarry adenopathy).  Abdominal:     General: Bowel sounds are normal.     Palpations: Abdomen is soft.     Tenderness: There is no abdominal tenderness.  Musculoskeletal:        General: No swelling or tenderness.     Cervical back: Neck supple.  Lymphadenopathy:     Cervical: No cervical adenopathy.  Skin:    Findings: No erythema or rash.  Neurological:     Mental Status: She is alert and oriented to person, place, and time.  Psychiatric:        Mood and Affect: Mood normal.        Behavior: Behavior normal.         Outpatient Encounter Medications as of 03/12/2024  Medication Sig   acetaminophen  (TYLENOL ) 650 MG CR tablet Take 650 mg by mouth every 8 (eight) hours as needed for pain.   amLODipine  (NORVASC ) 2.5 MG tablet Take 1 tablet (2.5 mg total) by mouth daily.   aspirin  81 MG chewable tablet Chew 1 tablet (81 mg total) by mouth 2 (two) times daily. (Patient not taking: Reported on 01/03/2024)   Calcium Carb-Cholecalciferol (CALTRATE 600+D3 SOFT) 600-20 MG-MCG CHEW Chew 1 tablet by mouth in the morning and at bedtime.   celecoxib  (CELEBREX ) 200 MG capsule Take 1 capsule (200 mg total) by mouth 2 (two) times daily.   denosumab  (PROLIA ) 60 MG/ML SOSY injection Inject 60 mg into the skin every 6 (six) months.   gabapentin  (NEURONTIN ) 100 MG capsule Take 1 capsule (100 mg total) by mouth 3 (three) times daily.  Multiple Vitamin (MULTIVITAMIN WITH MINERALS) TABS tablet Take 1 tablet by mouth in the morning.   oxyCODONE  (OXY IR/ROXICODONE ) 5 MG immediate release tablet Take 1 tablet (5 mg total) by mouth every 4 (four) hours as needed for moderate pain (pain score 4-6) (pain score 4-6). (Patient not taking: Reported on 01/03/2024)   traMADol  (ULTRAM ) 50 MG tablet Take 1-2 tablets (50-100 mg total) by mouth every 4 (four) hours as needed for moderate pain (pain score 4-6). (Patient not taking: Reported on 01/03/2024)   Facility-Administered Encounter Medications as of  03/12/2024  Medication   [START ON 04/20/2024] denosumab  (PROLIA ) injection 60 mg   sodium chloride  0.9 % 50 mL with famotidine  (PEPCID ) 20 mg infusion   sodium chloride  flush (NS) 0.9 % injection 10 mL     Lab Results  Component Value Date   WBC 7.3 09/06/2023   HGB 13.0 09/06/2023   HCT 39.4 09/06/2023   PLT 251.0 09/06/2023   GLUCOSE 87 03/10/2024   CHOL 165 03/10/2024   TRIG 127.0 03/10/2024   HDL 59.00 03/10/2024   LDLCALC 80 03/10/2024   ALT 16 03/10/2024   AST 19 03/10/2024   NA 143 03/10/2024   K 4.3 03/10/2024   CL 106 03/10/2024   CREATININE 0.48 03/10/2024   BUN 13 03/10/2024   CO2 30 03/10/2024   TSH 2.15 09/06/2023   INR 1.0 05/21/2012   HGBA1C 5.7 03/10/2024    DG Knee Left Port Result Date: 10/28/2023 CLINICAL DATA:  Postop. EXAM: PORTABLE LEFT KNEE - 1-2 VIEW COMPARISON:  None Available. FINDINGS: Left knee arthroplasty in expected alignment. No periprosthetic lucency or fracture. There has been patellar resurfacing. Recent postsurgical change includes air and edema in the soft tissues and joint space. Anterior skin staples in place. Suprapatellar drain. IMPRESSION: Left knee arthroplasty without immediate postoperative complication. Electronically Signed   By: Andrea Gasman M.D.   On: 10/28/2023 16:55       Assessment & Plan:  Health care maintenance Assessment & Plan: Physical today 03/12/24.  Colonoscopy 03/2016.  Mammogram 02/26/23 - Birads I.  Continue prolia .    Vitamin D  deficiency Assessment & Plan: Vitamin D  level 09/06/23 - wnl.    Osteoporosis without current pathological fracture, unspecified osteoporosis type Assessment & Plan: Her recent bone density scan shows worsening osteoporosis with T-score of -2.5 in her left femur as compared to 1.7 in 2022. Continue calcium and vitamin D  and weight bearing exercise. Continue prolia . Continue for 1-2 more years.  Repeat bone density. If continues to decline, recommend evenity.    Anemia,  unspecified type Assessment & Plan: Is followed by oncology. Hgb has been wnl. Last check 08/2023.    Chemotherapy-induced neuropathy Assessment & Plan: Documented - oncology. Stable.    History of colonic polyps Assessment & Plan: Colonoscopy 03/2016 - normal.     History of ovarian cancer Assessment & Plan: Followed by oncology.    Hypercholesterolemia Assessment & Plan: Follow lipid panel.    Hyperglycemia Assessment & Plan: Follow met b and A1c    Hypertension, essential Assessment & Plan: Continue amlodipine  2.5mg  q day. Follow pressures. Follow metabolic panel.    Personal history of breast cancer Assessment & Plan: Followed by oncology as outlined.       Allena Hamilton, MD

## 2024-03-15 ENCOUNTER — Encounter: Payer: Self-pay | Admitting: Internal Medicine

## 2024-03-15 NOTE — Assessment & Plan Note (Signed)
 Her recent bone density scan shows worsening osteoporosis with T-score of -2.5 in her left femur as compared to 1.7 in 2022. Continue calcium and vitamin D  and weight bearing exercise. Continue prolia . Continue for 1-2 more years.  Repeat bone density. If continues to decline, recommend evenity.

## 2024-03-15 NOTE — Assessment & Plan Note (Signed)
 Follow lipid panel.

## 2024-03-15 NOTE — Assessment & Plan Note (Signed)
Documented - oncology.  Stable.   

## 2024-03-15 NOTE — Assessment & Plan Note (Signed)
Colonoscopy 03/2016 - normal.   

## 2024-03-15 NOTE — Assessment & Plan Note (Signed)
 Follow met b and A1c.

## 2024-03-15 NOTE — Assessment & Plan Note (Signed)
 Followed by oncology as outlined.

## 2024-03-15 NOTE — Assessment & Plan Note (Signed)
 Followed by oncology

## 2024-03-15 NOTE — Assessment & Plan Note (Signed)
Continue amlodipine 2.5mg  q day.  Follow pressures.  Follow metabolic panel.

## 2024-03-15 NOTE — Assessment & Plan Note (Signed)
 Is followed by oncology. Hgb has been wnl. Last check 08/2023.

## 2024-03-15 NOTE — Assessment & Plan Note (Signed)
 Vitamin D  level 09/06/23 - wnl.

## 2024-03-19 ENCOUNTER — Other Ambulatory Visit (HOSPITAL_COMMUNITY): Payer: Self-pay

## 2024-03-19 ENCOUNTER — Telehealth: Payer: Self-pay

## 2024-03-19 NOTE — Telephone Encounter (Addendum)
 MEDICAL PA SUBMITTED VIA [employer]. Authorization Number : [id number]     PHARMACY: $60

## 2024-03-19 NOTE — Telephone Encounter (Signed)
 Prolia  VOB initiated via [email or web address]  Next Prolia  inj DUE: 04/19/24

## 2024-03-19 NOTE — Telephone Encounter (Signed)
 SABRA

## 2024-03-19 NOTE — Telephone Encounter (Signed)
 Pt ready for scheduling for PROLIA  on or after : 04/19/24  Option# 1: Buy/Bill (Office supplied medication)  Out-of-pocket cost due at time of clinic visit: $80  Number of injection/visits approved: 2  Primary: AETNA-MEDICARE Prolia  co-insurance: $40 Admin fee co-insurance: $40  Secondary: --- Prolia  co-insurance:  Admin fee co-insurance:   Medical Benefit Details: Date Benefits were checked: 03/19/24 Deductible: $200 Met of $200 Required/ Coinsurance: $40/ Admin Fee: $40  Prior Auth: APPROVED PA# [id number] Expiration Date: 03/19/24-03/19/25   # of doses approved: 2 ----------------------------------------------------------------------- Option# 2- Med Obtained from pharmacy:  Pharmacy benefit: Copay $60 (Paid to pharmacy) Admin Fee: $40 (Pay at clinic)  Prior Auth: N/A PA# Expiration Date:   # of doses approved:   If patient wants fill through the pharmacy benefit please send prescription to: [facility], and include estimated need by date in rx notes. Pharmacy will ship medication directly to the office.  Patient NOT eligible for Prolia  Copay Card. Copay Card can make patient's cost as little as $25. Link to apply: [email or web address]  ** This summary of benefits is an estimation of the patient's out-of-pocket cost. Exact cost may very based on individual plan coverage.

## 2024-04-14 ENCOUNTER — Other Ambulatory Visit: Payer: Self-pay

## 2024-04-14 ENCOUNTER — Encounter: Payer: Self-pay | Admitting: Ophthalmology

## 2024-04-17 NOTE — Discharge Instructions (Signed)

## 2024-04-20 ENCOUNTER — Ambulatory Visit

## 2024-04-20 DIAGNOSIS — M810 Age-related osteoporosis without current pathological fracture: Secondary | ICD-10-CM

## 2024-04-20 MED ORDER — DENOSUMAB 60 MG/ML ~~LOC~~ SOSY
60.0000 mg | PREFILLED_SYRINGE | SUBCUTANEOUS | Status: DC
  Administered 2024-04-20: 60 mg via SUBCUTANEOUS

## 2024-04-20 MED ORDER — DENOSUMAB 60 MG/ML ~~LOC~~ SOSY: 60.0000 mg | PREFILLED_SYRINGE | SUBCUTANEOUS | Status: AC

## 2024-04-20 NOTE — Progress Notes (Signed)
 Patient was administered a Prolia  injection into her left arm subcutaneously. Patient tolerated the Prolia  injection well.

## 2024-04-20 NOTE — Anesthesia Preprocedure Evaluation (Addendum)
 Anesthesia Evaluation  Patient identified by MRN, date of birth, ID band Patient awake    Reviewed: Allergy & Precautions, H&P , NPO status , Patient's Chart, lab work & pertinent test results  Airway Mallampati: I  TM Distance: >3 FB Neck ROM: Full    Dental no notable dental hx. (+) Edentulous Upper, Upper Dentures   Pulmonary neg pulmonary ROS   Pulmonary exam normal breath sounds clear to auscultation       Cardiovascular hypertension, Normal cardiovascular exam+ Valvular Problems/Murmurs  Rhythm:Regular Rate:Normal     Neuro/Psych  Neuromuscular disease negative neurological ROS  negative psych ROS   GI/Hepatic negative GI ROS, Neg liver ROS,,,  Endo/Other  negative endocrine ROS    Renal/GU negative Renal ROS  negative genitourinary   Musculoskeletal negative musculoskeletal ROS (+) Arthritis ,    Abdominal   Peds negative pediatric ROS (+)  Hematology negative hematology ROS (+) Blood dyscrasia, anemia   Anesthesia Other Findings Hypertension  Hyperlipidemia Endometriosis  Fibrocystic breast disease Glaucoma  Sinusitis History of chicken pox  Diverticulitis of sigmoid colon History of colon polyps Tuberculosis H/O urinary incontinence  Ovarian cancer  Personal history of chemotherapy Port-A-Cath in place Personal history of ovarian cancer Personal history of radiation therapy Breast cancer (HCC)     Reproductive/Obstetrics negative OB ROS                              Anesthesia Physical Anesthesia Plan  ASA: 3  Anesthesia Plan: MAC   Post-op Pain Management:    Induction: Intravenous  PONV Risk Score and Plan:   Airway Management Planned: Natural Airway and Nasal Cannula  Additional Equipment:   Intra-op Plan:   Post-operative Plan:   Informed Consent: I have reviewed the patients History and Physical, chart, labs and discussed the procedure including  the risks, benefits and alternatives for the proposed anesthesia with the patient or authorized representative who has indicated his/her understanding and acceptance.     Dental Advisory Given  Plan Discussed with: Anesthesiologist, CRNA and Surgeon  Anesthesia Plan Comments: (Patient consented for risks of anesthesia including but not limited to:  - adverse reactions to medications - damage to eyes, teeth, lips or other oral mucosa - nerve damage due to positioning  - sore throat or hoarseness - Damage to heart, brain, nerves, lungs, other parts of body or loss of life  Patient voiced understanding and assent.)         Anesthesia Quick Evaluation

## 2024-04-21 ENCOUNTER — Encounter
Admission: RE | Disposition: A | Discharge status: Home / Self Care | Payer: Self-pay | Source: Home / Self Care | Attending: Ophthalmology

## 2024-04-21 ENCOUNTER — Ambulatory Visit: Payer: Self-pay | Admitting: Anesthesiology

## 2024-04-21 ENCOUNTER — Other Ambulatory Visit: Payer: Self-pay

## 2024-04-21 ENCOUNTER — Encounter: Payer: Self-pay | Admitting: Ophthalmology

## 2024-04-21 ENCOUNTER — Ambulatory Visit
Admission: RE | Admit: 2024-04-21 | Discharge: 2024-04-21 | Disposition: A | Discharge status: Home / Self Care | Attending: Ophthalmology | Admitting: Ophthalmology

## 2024-04-21 DIAGNOSIS — H2511 Age-related nuclear cataract, right eye: Secondary | ICD-10-CM | POA: Insufficient documentation

## 2024-04-21 DIAGNOSIS — I10 Essential (primary) hypertension: Secondary | ICD-10-CM | POA: Insufficient documentation

## 2024-04-21 HISTORY — PX: CATARACT EXTRACTION W/PHACO: SHX586

## 2024-04-21 SURGERY — PHACOEMULSIFICATION, CATARACT, WITH IOL INSERTION
Anesthesia: Monitor Anesthesia Care | Site: Eye | Laterality: Right

## 2024-04-21 MED ORDER — MIDAZOLAM HCL 2 MG/2ML IJ SOLN
INTRAMUSCULAR | Status: AC
  Filled 2024-04-21: qty 2

## 2024-04-21 MED ORDER — BRIMONIDINE TARTRATE-TIMOLOL 0.2-0.5 % OP SOLN
OPHTHALMIC | Status: DC | PRN
  Administered 2024-04-21: 1 [drp] via OPHTHALMIC

## 2024-04-21 MED ORDER — TETRACAINE HCL 0.5 % OP SOLN
OPHTHALMIC | Status: DC | PRN
  Administered 2024-04-21: 2 [drp] via OPHTHALMIC

## 2024-04-21 MED ORDER — CYCLOPENTOLATE HCL 2 % OP SOLN
1.0000 [drp] | OPHTHALMIC | Status: AC
  Administered 2024-04-21 (×3): 1 [drp] via OPHTHALMIC

## 2024-04-21 MED ORDER — MIDAZOLAM HCL (PF) 2 MG/2ML IJ SOLN
INTRAMUSCULAR | Status: DC | PRN
  Administered 2024-04-21 (×2): 2 mg via INTRAVENOUS

## 2024-04-21 MED ORDER — TETRACAINE HCL 0.5 % OP SOLN
1.0000 [drp] | OPHTHALMIC | Status: DC | PRN
  Administered 2024-04-21 (×3): 1 [drp] via OPHTHALMIC

## 2024-04-21 MED ORDER — PHENYLEPHRINE HCL 10 % OP SOLN
1.0000 [drp] | OPHTHALMIC | Status: AC
  Administered 2024-04-21 (×3): 1 [drp] via OPHTHALMIC

## 2024-04-21 MED ORDER — LACTATED RINGERS IV SOLN: INTRAVENOUS | Status: DC

## 2024-04-21 MED ORDER — SIGHTPATH DOSE#1 BSS IO SOLN
INTRAOCULAR | Status: DC | PRN
  Administered 2024-04-21: 86 mL via OPHTHALMIC

## 2024-04-21 MED ORDER — SIGHTPATH DOSE#1 NA CHONDROIT SULF-NA HYALURON 40-17 MG/ML IO SOLN
INTRAOCULAR | Status: DC | PRN
  Administered 2024-04-21: 1 mL via INTRAOCULAR

## 2024-04-21 MED ORDER — LIDOCAINE HCL (PF) 2 % IJ SOLN
INTRAOCULAR | Status: DC | PRN
  Administered 2024-04-21: 2 mL

## 2024-04-21 MED ORDER — MOXIFLOXACIN HCL 0.5 % OP SOLN
OPHTHALMIC | Status: DC | PRN
  Administered 2024-04-21: .2 mL via OPHTHALMIC

## 2024-04-21 MED ORDER — DEXMEDETOMIDINE HCL IN NACL 80 MCG/20ML IV SOLN
INTRAVENOUS | Status: DC | PRN
  Administered 2024-04-21: 10 ug via INTRAVENOUS

## 2024-04-21 MED ORDER — SIGHTPATH DOSE#1 BSS IO SOLN
INTRAOCULAR | Status: DC | PRN
  Administered 2024-04-21: 15 mL via INTRAOCULAR

## 2024-04-21 SURGICAL SUPPLY — 10 items
CANNULA ANT/CHMB 27G (MISCELLANEOUS) ×1 IMPLANT
CYSTOTOME ANGL RVRS SHRT 25G (CUTTER) ×1 IMPLANT
FEE CATARACT SUITE SIGHTPATH (MISCELLANEOUS) ×1 IMPLANT
GLOVE BIOGEL PI IND STRL 8 (GLOVE) ×1 IMPLANT
GLOVE SURG LX STRL 8.0 MICRO (GLOVE) ×1 IMPLANT
GLOVE SURG SYN 6.5 PF PI BL (GLOVE) ×1 IMPLANT
LENS IOL TECNIS EYHANCE 19.5 (Intraocular Lens) IMPLANT
NDL FILTER BLUNT 18X1 1/2 (NEEDLE) ×1 IMPLANT
NEEDLE FILTER BLUNT 18X1 1/2 (NEEDLE) ×1 IMPLANT
SYR 3ML LL SCALE MARK (SYRINGE) ×1 IMPLANT

## 2024-04-21 NOTE — Anesthesia Postprocedure Evaluation (Signed)
 Anesthesia Post Note  Patient: [name] [name]  Procedure(s) Performed: PHACOEMULSIFICATION, CATARACT, WITH IOL INSERTION 29.17 02:21.9 (Right: Eye)  Patient location during evaluation: PACU Anesthesia Type: MAC Level of consciousness: awake and alert Pain management: pain level controlled Vital Signs Assessment: post-procedure vital signs reviewed and stable Respiratory status: spontaneous breathing, nonlabored ventilation, respiratory function stable and patient connected to nasal cannula oxygen Cardiovascular status: stable and blood pressure returned to baseline Postop Assessment: no apparent nausea or vomiting Anesthetic complications: no   No notable events documented.   Last Vitals:  Vitals:   04/21/24 1000 04/21/24 1006  BP: (!) 133/55 (!) 138/55  Pulse: 63 70  Resp: (!) 21 (!) 27  Temp: 36.6 C 36.4 C  SpO2: 99% 98%    Last Pain:  Vitals:   04/21/24 1006  TempSrc:   PainSc: 0-No pain                 Lekita Kerekes C Lyan Holck

## 2024-04-21 NOTE — H&P (Signed)
 [facility]   Primary Care Physician:  Glendia Shad, MD Ophthalmologist: Dr. Elsie Carmine  Pre-Procedure History & Physical: HPI:  [name] [name] is a 89 y.o. female here for cataract surgery.   Past Medical History:  Diagnosis Date   Breast cancer (HCC)    Diverticulitis of sigmoid colon    Endometriosis    Family history of breast cancer    Family history of ovarian cancer    Family history of prostate cancer    Family history of uterine cancer    Fibrocystic breast disease    Glaucoma    H/O urinary incontinence    History of chicken pox    History of colon polyps 2014   Hyperlipidemia    Hypertension    Ovarian cancer (HCC) 2002   Chemo tx's @ [facility] with Total Hysterectomy. Dr Janit   Personal history of chemotherapy    ovarian cancer   Personal history of ovarian cancer    Personal history of radiation therapy    Port-A-Cath in place    Sinusitis    Tuberculosis    positve skin test    Past Surgical History:  Procedure Laterality Date   ABDOMINAL HYSTERECTOMY  2003   complete Dr Janit   ABSCESS DRAINAGE  2017   right buttock at [facility]   APPENDECTOMY  2003   BREAST EXCISIONAL BIOPSY Left 1993   excisional bx. negative results   BREAST LUMPECTOMY Right 03/18/2018   done at [facility] breast ca rad and chemo Invasive ductal carcinoma with focal squamous differentiation, Nottingham grade 3, is identified forming a 6 mm mass, associated with high-grade ductal carcinoma in situ, radial scar and biopsy site changes.   BREAST SURGERY Left 1982   papilloma removal   COLONOSCOPY  2006, 2014   Dr Viktoria    COLONOSCOPY N/A 03/27/2016   Procedure: COLONOSCOPY;  Surgeon: Reyes LELON Cota, MD;  Location: [facility] ENDOSCOPY;  Service: Endoscopy;  Laterality: N/A;   COLOSTOMY REVERSAL  2014   at [facility] Dr Rock Sauger   KNEE ARTHROPLASTY Left 10/28/2023   Procedure: ARTHROPLASTY, KNEE, TOTAL, USING IMAGELESS COMPUTER-ASSISTED NAVIGATION;  Surgeon: Mardee Lynwood SQUIBB, MD;   Location: [facility] ORS;  Service: Orthopedics;  Laterality: Left;   KNEE SURGERY Right 10/2022   knee replacement   NASAL SINUS SURGERY     For fungal infection   NASAL SINUS SURGERY  8/05 8/06   OOPHORECTOMY     OSTOMY  05/22/2012   diverticulitis with abcess/ Dr Claudene   ovarian cancer     Exploratory lap   PORTA CATH INSERTION N/A 05/26/2018   Procedure: PORTA CATH INSERTION;  Surgeon: Marea Selinda RAMAN, MD;  Location: [facility] INVASIVE CV LAB;  Service: Cardiovascular;  Laterality: N/A;   PORTA CATH REMOVAL N/A 11/12/2019   Procedure: PORTA CATH REMOVAL;  Surgeon: Marea Selinda RAMAN, MD;  Location: [facility] INVASIVE CV LAB;  Service: Cardiovascular;  Laterality: N/A;   TONSILLECTOMY AND ADENOIDECTOMY  1947    Prior to Admission medications   Medication Sig Start Date End Date Taking? Authorizing Provider  acetaminophen  (TYLENOL ) 650 MG CR tablet Take 650 mg by mouth every 8 (eight) hours as needed for pain.   Yes [provider]  amLODipine  (NORVASC ) 2.5 MG tablet Take 1 tablet (2.5 mg total) by mouth daily. 10/18/23  Yes Glendia Shad, MD  aspirin  81 MG chewable tablet Chew 1 tablet (81 mg total) by mouth 2 (two) times daily. 10/29/23  Yes Drake Chew, PA-C  Calcium Carb-Cholecalciferol (  CALTRATE 600+D3 SOFT) 600-20 MG-MCG CHEW Chew 1 tablet by mouth in the morning and at bedtime.   Yes [provider]  celecoxib  (CELEBREX ) 200 MG capsule Take 1 capsule (200 mg total) by mouth 2 (two) times daily. 10/29/23  Yes Drake Chew, PA-C  denosumab  (PROLIA ) 60 MG/ML SOSY injection Inject 60 mg into the skin every 6 (six) months.   Yes [provider]  gabapentin  (NEURONTIN ) 100 MG capsule Take 1 capsule (100 mg total) by mouth 3 (three) times daily. 10/18/23  Yes Glendia Shad, MD  Multiple Vitamin (MULTIVITAMIN WITH MINERALS) TABS tablet Take 1 tablet by mouth in the morning.   Yes [provider]  oxyCODONE  (OXY IR/ROXICODONE ) 5 MG immediate release tablet Take 1 tablet  (5 mg total) by mouth every 4 (four) hours as needed for moderate pain (pain score 4-6) (pain score 4-6). 10/29/23  Yes Drake Chew, PA-C  traMADol  (ULTRAM ) 50 MG tablet Take 1-2 tablets (50-100 mg total) by mouth every 4 (four) hours as needed for moderate pain (pain score 4-6). 10/29/23  Yes Drake Chew, PA-C    Allergies as of 04/07/2024   (No Known Allergies)    Family History  Problem Relation Age of Onset   Hypertension Mother    Coronary artery disease Mother    Heart disease Mother    Kidney disease Mother    Diabetes Mother    Cancer Mother        uterine cancer dx 38s   Diabetes Father    Kidney failure Father    Stroke Sister    Heart disease Sister        Visual Merchandiser   Dementia Sister    Diabetes Brother    Coronary artery disease Brother    Cancer Brother        bladder cancer   Cancer Brother        Prostate   Cancer Maternal Aunt 49       ovarian cancer   Breast cancer Cousin     Social History   Socioeconomic History   Marital status: Widowed    Spouse name: Not on file   Number of children: 0   Years of education: Not on file   Highest education level: Not on file  Occupational History   Not on file  Tobacco Use   Smoking status: Never   Smokeless tobacco: Never  Vaping Use   Vaping status: Never Used  Substance and Sexual Activity   Alcohol use: No    Alcohol/week: 0.0 standard drinks of alcohol   Drug use: Never   Sexual activity: Not Currently  Other Topics Concern   Not on file  Social History Narrative   Lives at home alone.   Social Drivers of Corporate Investment Banker Strain: Low Risk  (01/03/2024)   Overall Financial Resource Strain (CARDIA)    Difficulty of Paying Living Expenses: Not hard at all  Food Insecurity: No Food Insecurity (01/03/2024)   Hunger Vital Sign    Worried About Running Out of Food in the Last Year: Never true    Ran Out of Food in the Last Year: Never true  Transportation Needs: No Transportation  Needs (01/03/2024)   PRAPARE - Administrator, Civil Service (Medical): No    Lack of Transportation (Non-Medical): No  Physical Activity: Inactive (01/03/2024)   Exercise Vital Sign    Days of Exercise per Week: 0 days    Minutes of Exercise per Session:  0 min  Stress: No Stress Concern Present (01/03/2024)   [employer] of Occupational Health - Occupational Stress Questionnaire    Feeling of Stress: Not at all  Social Connections: Moderately Integrated (01/03/2024)   Social Connection and Isolation Panel    Frequency of Communication with Friends and Family: More than three times a week    Frequency of Social Gatherings with Friends and Family: Three times a week    Attends Religious Services: More than 4 times per year    Active Member of Clubs or Organizations: Yes    Attends Banker Meetings: More than 4 times per year    Marital Status: Widowed  Intimate Partner Violence: Not At Risk (01/03/2024)   Humiliation, Afraid, Rape, and Kick questionnaire    Fear of Current or Ex-Partner: No    Emotionally Abused: No    Physically Abused: No    Sexually Abused: No    Review of Systems: See HPI, otherwise negative ROS  Physical Exam: BP (!) 154/68   Pulse 64   Temp 97.9 F (36.6 C) (Temporal)   Resp 20   Wt 72.6 kg   SpO2 99%   BMI 28.34 kg/m  General:   Alert, cooperative. Head:  Normocephalic and atraumatic. Respiratory:  Normal work of breathing. Cardiovascular:  NAD  Impression/Plan: [name] [name] is here for cataract surgery.  Risks, benefits, limitations, and alternatives regarding cataract surgery have been reviewed with the patient.  Questions have been answered.  All parties agreeable.   Elsie Carmine, MD  04/21/2024, 9:27 AM

## 2024-04-21 NOTE — Op Note (Signed)
 PREOPERATIVE DIAGNOSIS:  Nuclear sclerotic cataract of the right eye.   POSTOPERATIVE DIAGNOSIS:  Right Eye Cataract   OPERATIVE PROCEDURE:ORPROCALL@   SURGEON:  Elsie Carmine, MD.   ANESTHESIA:  Anesthesiologist: Ola Donny BROCKS, MD CRNA: Dave Maus, CRNA  1.      Managed anesthesia care. 2.      0.32ml of Shugarcaine was instilled in the eye following the paracentesis.   COMPLICATIONS:  None.   TECHNIQUE:   Stop and chop   DESCRIPTION OF PROCEDURE:  The patient was examined and consented in the preoperative holding area where the aforementioned topical anesthesia was applied to the right eye and then brought back to the Operating Room where the right eye was prepped and draped in the usual sterile ophthalmic fashion and a lid speculum was placed. A paracentesis was created with the side port blade and the anterior chamber was filled with viscoelastic. A near clear corneal incision was performed with the steel keratome. A continuous curvilinear capsulorrhexis was performed with a cystotome followed by the capsulorrhexis forceps. Hydrodissection and hydrodelineation were carried out with BSS on a blunt cannula. The lens was removed in a stop and chop  technique and the remaining cortical material was removed with the irrigation-aspiration handpiece. The capsular bag was inflated with viscoelastic and the intraocular lens was placed in the capsular bag without complication. The remaining viscoelastic was removed from the eye with the irrigation-aspiration handpiece. The wounds were hydrated. The anterior chamber was flushed with BSS and the eye was inflated to physiologic pressure. 0.1ml of Vigamox was placed in the anterior chamber. The wounds were found to be water tight. The eye was dressed with Combigan. The patient was given protective glasses to wear throughout the day and a shield with which to sleep tonight. The patient was also given drops with which to begin a drop regimen today  and will follow-up with me in one day. Implant Name Type Inv. Item Serial No. Manufacturer Lot No. LRB No. Used Action  LENS IOL TECNIS EYHANCE 19.5 - [id number] Intraocular Lens LENS IOL TECNIS EYHANCE 19.5 [id number] [employer]  Right 1 Implanted   Procedure(s): PHACOEMULSIFICATION, CATARACT, WITH IOL INSERTION 29.17 02:21.9 (Right)  Electronically signed: Elsie Carmine 04/21/2024 9:58 AM

## 2024-04-21 NOTE — Transfer of Care (Signed)
 Immediate Anesthesia Transfer of Care Note  Patient: [name] [name]  Procedure(s) Performed: PHACOEMULSIFICATION, CATARACT, WITH IOL INSERTION 29.17 02:21.9 (Right: Eye)  Patient Location: PACU  Anesthesia Type: MAC  Level of Consciousness: awake, alert  and patient cooperative  Airway and Oxygen Therapy: Patient Spontanous Breathing and Patient connected to supplemental oxygen  Post-op Assessment: Post-op Vital signs reviewed, Patient's Cardiovascular Status Stable, Respiratory Function Stable, Patent Airway and No signs of Nausea or vomiting  Post-op Vital Signs: Reviewed and stable  Complications: No notable events documented.

## 2024-04-30 ENCOUNTER — Ambulatory Visit
Admission: RE | Admit: 2024-04-30 | Discharge: 2024-04-30 | Disposition: A | Discharge status: Home / Self Care | Source: Ambulatory Visit | Attending: Oncology | Admitting: Oncology

## 2024-04-30 DIAGNOSIS — Z1231 Encounter for screening mammogram for malignant neoplasm of breast: Secondary | ICD-10-CM | POA: Insufficient documentation

## 2024-04-30 DIAGNOSIS — Z853 Personal history of malignant neoplasm of breast: Secondary | ICD-10-CM | POA: Insufficient documentation

## 2024-04-30 DIAGNOSIS — Z08 Encounter for follow-up examination after completed treatment for malignant neoplasm: Secondary | ICD-10-CM | POA: Insufficient documentation

## 2024-04-30 NOTE — Discharge Instructions (Signed)

## 2024-05-01 NOTE — Anesthesia Preprocedure Evaluation (Signed)
 Anesthesia Evaluation  Patient identified by MRN, date of birth, ID band Patient awake    Reviewed: Allergy & Precautions, H&P , NPO status , Patient's Chart, lab work & pertinent test results  Airway Mallampati: I  TM Distance: >3 FB Neck ROM: Full    Dental no notable dental hx. (+) Edentulous Upper, Upper Dentures Edentulous Upper, Upper Dentures:   Pulmonary neg pulmonary ROS   Pulmonary exam normal breath sounds clear to auscultation       Cardiovascular hypertension, negative cardio ROS Normal cardiovascular exam+ Valvular Problems/Murmurs  Rhythm:Regular Rate:Normal     Neuro/Psych  Neuromuscular disease negative neurological ROS  negative psych ROS   GI/Hepatic negative GI ROS, Neg liver ROS,,,  Endo/Other  negative endocrine ROS    Renal/GU negative Renal ROS  negative genitourinary   Musculoskeletal negative musculoskeletal ROS (+) Arthritis ,    Abdominal   Peds negative pediatric ROS (+)  Hematology negative hematology ROS (+) Blood dyscrasia, anemia   Anesthesia Other Findings Previous cataract surgery 04-21-24 Dr. Ola  Hypertension             Hyperlipidemia Endometriosis             Fibrocystic breast disease Glaucoma             Sinusitis History of chicken pox             Diverticulitis of sigmoid colon History of colon polyps Tuberculosis H/O urinary incontinence    Ovarian cancer  Personal history of chemotherapyPort-A-Cath in place Personal history of ovarian cancerPersonal history of radiation therapy Breast cancer (HCC)    Reproductive/Obstetrics negative OB ROS                              Anesthesia Physical Anesthesia Plan  ASA: 3  Anesthesia Plan: MAC   Post-op Pain Management:    Induction: Intravenous  PONV Risk Score and Plan:   Airway Management Planned: Natural Airway and Nasal Cannula  Additional Equipment:   Intra-op Plan:    Post-operative Plan:   Informed Consent: I have reviewed the patients History and Physical, chart, labs and discussed the procedure including the risks, benefits and alternatives for the proposed anesthesia with the patient or authorized representative who has indicated his/her understanding and acceptance.     Dental Advisory Given  Plan Discussed with: Anesthesiologist, CRNA and Surgeon  Anesthesia Plan Comments: (Patient consented for risks of anesthesia including but not limited to:  - adverse reactions to medications - damage to eyes, teeth, lips or other oral mucosa - nerve damage due to positioning  - sore throat or hoarseness - Damage to heart, brain, nerves, lungs, other parts of body or loss of life  Patient voiced understanding and assent.)         Anesthesia Quick Evaluation

## 2024-05-05 ENCOUNTER — Ambulatory Visit: Payer: Self-pay | Admitting: Anesthesiology

## 2024-05-05 ENCOUNTER — Encounter
Admission: RE | Disposition: A | Discharge status: Home / Self Care | Payer: Self-pay | Source: Home / Self Care | Attending: Ophthalmology

## 2024-05-05 ENCOUNTER — Ambulatory Visit
Admission: RE | Admit: 2024-05-05 | Discharge: 2024-05-05 | Disposition: A | Discharge status: Home / Self Care | Attending: Ophthalmology | Admitting: Ophthalmology

## 2024-05-05 ENCOUNTER — Other Ambulatory Visit: Payer: Self-pay

## 2024-05-05 ENCOUNTER — Encounter: Payer: Self-pay | Admitting: Ophthalmology

## 2024-05-05 DIAGNOSIS — M1990 Unspecified osteoarthritis, unspecified site: Secondary | ICD-10-CM | POA: Insufficient documentation

## 2024-05-05 DIAGNOSIS — I10 Essential (primary) hypertension: Secondary | ICD-10-CM | POA: Insufficient documentation

## 2024-05-05 DIAGNOSIS — H2512 Age-related nuclear cataract, left eye: Secondary | ICD-10-CM | POA: Diagnosis present

## 2024-05-05 DIAGNOSIS — Z7982 Long term (current) use of aspirin: Secondary | ICD-10-CM | POA: Diagnosis not present

## 2024-05-05 HISTORY — PX: CATARACT EXTRACTION W/PHACO: SHX586

## 2024-05-05 SURGERY — PHACOEMULSIFICATION, CATARACT, WITH IOL INSERTION
Anesthesia: Monitor Anesthesia Care | Site: Eye | Laterality: Left

## 2024-05-05 MED ORDER — PHENYLEPHRINE HCL 10 % OP SOLN
1.0000 [drp] | OPHTHALMIC | Status: AC
  Administered 2024-05-05 (×3): 1 [drp] via OPHTHALMIC

## 2024-05-05 MED ORDER — FENTANYL CITRATE (PF) 100 MCG/2ML IJ SOLN
INTRAMUSCULAR | Status: AC
  Filled 2024-05-05: qty 2

## 2024-05-05 MED ORDER — TETRACAINE HCL 0.5 % OP SOLN
1.0000 [drp] | OPHTHALMIC | Status: DC | PRN
  Administered 2024-05-05 (×3): 1 [drp] via OPHTHALMIC

## 2024-05-05 MED ORDER — MIDAZOLAM HCL 2 MG/2ML IJ SOLN
INTRAMUSCULAR | Status: AC
  Filled 2024-05-05: qty 2

## 2024-05-05 MED ORDER — CYCLOPENTOLATE HCL 2 % OP SOLN
OPHTHALMIC | Status: AC
  Filled 2024-05-05: qty 2

## 2024-05-05 MED ORDER — SIGHTPATH DOSE#1 NA CHONDROIT SULF-NA HYALURON 40-17 MG/ML IO SOLN
INTRAOCULAR | Status: DC | PRN
  Administered 2024-05-05: 1 mL via INTRAOCULAR

## 2024-05-05 MED ORDER — SIGHTPATH DOSE#1 BSS IO SOLN
INTRAOCULAR | Status: DC | PRN
  Administered 2024-05-05: 15 mL via INTRAOCULAR

## 2024-05-05 MED ORDER — MOXIFLOXACIN HCL 0.5 % OP SOLN
OPHTHALMIC | Status: DC | PRN
Start: 2024-05-05 — End: 2024-05-05
  Administered 2024-05-05: .2 mL via OPHTHALMIC

## 2024-05-05 MED ORDER — FENTANYL CITRATE (PF) 100 MCG/2ML IJ SOLN
INTRAMUSCULAR | Status: DC | PRN
  Administered 2024-05-05: 50 ug via INTRAVENOUS

## 2024-05-05 MED ORDER — LIDOCAINE HCL (PF) 2 % IJ SOLN
INTRAOCULAR | Status: DC | PRN
  Administered 2024-05-05: 2 mL

## 2024-05-05 MED ORDER — MIDAZOLAM HCL (PF) 2 MG/2ML IJ SOLN
INTRAMUSCULAR | Status: DC | PRN
  Administered 2024-05-05: 1 mg via INTRAVENOUS

## 2024-05-05 MED ORDER — CYCLOPENTOLATE HCL 2 % OP SOLN
1.0000 [drp] | OPHTHALMIC | Status: AC
Start: 2024-05-05 — End: 2024-05-05
  Administered 2024-05-05 (×3): 1 [drp] via OPHTHALMIC

## 2024-05-05 MED ORDER — SIGHTPATH DOSE#1 BSS IO SOLN
INTRAOCULAR | Status: DC | PRN
  Administered 2024-05-05: 86 mL via OPHTHALMIC

## 2024-05-05 MED ORDER — BRIMONIDINE TARTRATE-TIMOLOL 0.2-0.5 % OP SOLN
OPHTHALMIC | Status: DC | PRN
  Administered 2024-05-05: 1 [drp] via OPHTHALMIC

## 2024-05-05 MED ORDER — PHENYLEPHRINE HCL 10 % OP SOLN
OPHTHALMIC | Status: AC
  Filled 2024-05-05: qty 5

## 2024-05-05 MED ORDER — TETRACAINE HCL 0.5 % OP SOLN
OPHTHALMIC | Status: AC
Start: 2024-05-05 — End: 2024-05-05
  Filled 2024-05-05: qty 4

## 2024-05-05 SURGICAL SUPPLY — 9 items
CANNULA ANT/CHMB 27G (MISCELLANEOUS) ×1 IMPLANT
CYSTOTOME ANGL RVRS SHRT 25G (CUTTER) ×1 IMPLANT
FEE CATARACT SUITE SIGHTPATH (MISCELLANEOUS) ×1 IMPLANT
GLOVE BIOGEL PI IND STRL 8 (GLOVE) ×1 IMPLANT
GLOVE SURG LX STRL 8.0 MICRO (GLOVE) ×1 IMPLANT
GLOVE SURG SYN 6.5 PF PI BL (GLOVE) ×1 IMPLANT
LENS IOL TECNIS EYHANCE 20.0 (Intraocular Lens) IMPLANT
NDL FILTER BLUNT 18X1 1/2 (NEEDLE) ×1 IMPLANT
SYR 3ML LL SCALE MARK (SYRINGE) ×1 IMPLANT

## 2024-05-05 NOTE — H&P (Signed)
 [facility]   Primary Care Physician:  Glendia Shad, MD Ophthalmologist: Dr. Elsie Carmine  Pre-Procedure History & Physical: HPI:  [name] [name] is a 89 y.o. female here for cataract surgery.   Past Medical History:  Diagnosis Date   Breast cancer (HCC)    Diverticulitis of sigmoid colon    Endometriosis    Family history of breast cancer    Family history of ovarian cancer    Family history of prostate cancer    Family history of uterine cancer    Fibrocystic breast disease    Glaucoma    H/O urinary incontinence    History of chicken pox    History of colon polyps 2014   Hyperlipidemia    Hypertension    Ovarian cancer (HCC) 2002   Chemo tx's @ [facility] with Total Hysterectomy. Dr Janit   Personal history of chemotherapy    ovarian cancer   Personal history of ovarian cancer    Personal history of radiation therapy    Port-A-Cath in place    Sinusitis    Tuberculosis    positve skin test    Past Surgical History:  Procedure Laterality Date   ABDOMINAL HYSTERECTOMY  2003   complete Dr Janit   ABSCESS DRAINAGE  2017   right buttock at [facility]   APPENDECTOMY  2003   BREAST EXCISIONAL BIOPSY Left 1993   excisional bx. negative results   BREAST LUMPECTOMY Right 03/18/2018   done at [facility] breast ca rad and chemo Invasive ductal carcinoma with focal squamous differentiation, Nottingham grade 3, is identified forming a 6 mm mass, associated with high-grade ductal carcinoma in situ, radial scar and biopsy site changes.   BREAST SURGERY Left 1982   papilloma removal   CATARACT EXTRACTION W/PHACO Right 04/21/2024   Procedure: PHACOEMULSIFICATION, CATARACT, WITH IOL INSERTION 29.17 02:21.9;  Surgeon: Carmine Elsie, MD;  Location: [facility];  Service: Ophthalmology;  Laterality: Right;   COLONOSCOPY  2006, 2014   Dr Viktoria    COLONOSCOPY N/A 03/27/2016   Procedure: COLONOSCOPY;  Surgeon: Reyes LELON Cota, MD;  Location: [facility] ENDOSCOPY;  Service:  Endoscopy;  Laterality: N/A;   COLOSTOMY REVERSAL  2014   at [facility] Dr Rock Sauger   KNEE ARTHROPLASTY Left 10/28/2023   Procedure: ARTHROPLASTY, KNEE, TOTAL, USING IMAGELESS COMPUTER-ASSISTED NAVIGATION;  Surgeon: Mardee Lynwood SQUIBB, MD;  Location: [facility] ORS;  Service: Orthopedics;  Laterality: Left;   KNEE SURGERY Right 10/2022   knee replacement   NASAL SINUS SURGERY     For fungal infection   NASAL SINUS SURGERY  8/05 8/06   OOPHORECTOMY     OSTOMY  05/22/2012   diverticulitis with abcess/ Dr Claudene   ovarian cancer     Exploratory lap   PORTA CATH INSERTION N/A 05/26/2018   Procedure: PORTA CATH INSERTION;  Surgeon: Marea Selinda RAMAN, MD;  Location: [facility] INVASIVE CV LAB;  Service: Cardiovascular;  Laterality: N/A;   PORTA CATH REMOVAL N/A 11/12/2019   Procedure: PORTA CATH REMOVAL;  Surgeon: Marea Selinda RAMAN, MD;  Location: [facility] INVASIVE CV LAB;  Service: Cardiovascular;  Laterality: N/A;   TONSILLECTOMY AND ADENOIDECTOMY  1947    Prior to Admission medications   Medication Sig Start Date End Date Taking? Authorizing Provider  acetaminophen  (TYLENOL ) 650 MG CR tablet Take 650 mg by mouth every 8 (eight) hours as needed for pain.   Yes [provider]  amLODipine  (NORVASC ) 2.5 MG tablet Take 1 tablet (2.5 mg total) by mouth daily.  10/18/23  Yes Glendia Shad, MD  aspirin  81 MG chewable tablet Chew 1 tablet (81 mg total) by mouth 2 (two) times daily. 10/29/23  Yes Drake Chew, PA-C  Calcium Carb-Cholecalciferol (CALTRATE 600+D3 SOFT) 600-20 MG-MCG CHEW Chew 1 tablet by mouth in the morning and at bedtime.   Yes [provider]  celecoxib  (CELEBREX ) 200 MG capsule Take 1 capsule (200 mg total) by mouth 2 (two) times daily. 10/29/23  Yes Drake Chew, PA-C  Multiple Vitamin (MULTIVITAMIN WITH MINERALS) TABS tablet Take 1 tablet by mouth in the morning.   Yes [provider]  oxyCODONE  (OXY IR/ROXICODONE ) 5 MG immediate release tablet Take 1 tablet (5 mg total) by  mouth every 4 (four) hours as needed for moderate pain (pain score 4-6) (pain score 4-6). 10/29/23  Yes Drake Chew, PA-C  traMADol  (ULTRAM ) 50 MG tablet Take 1-2 tablets (50-100 mg total) by mouth every 4 (four) hours as needed for moderate pain (pain score 4-6). 10/29/23  Yes Drake Chew, PA-C  denosumab  (PROLIA ) 60 MG/ML SOSY injection Inject 60 mg into the skin every 6 (six) months.    [provider]  gabapentin  (NEURONTIN ) 100 MG capsule Take 1 capsule (100 mg total) by mouth 3 (three) times daily. 10/18/23   Glendia Shad, MD    Allergies as of 04/07/2024   (No Known Allergies)    Family History  Problem Relation Age of Onset   Hypertension Mother    Coronary artery disease Mother    Heart disease Mother    Kidney disease Mother    Diabetes Mother    Cancer Mother        uterine cancer dx 51s   Diabetes Father    Kidney failure Father    Stroke Sister    Heart disease Sister        Pace Maker   Dementia Sister    Diabetes Brother    Coronary artery disease Brother    Cancer Brother        bladder cancer   Cancer Brother        Prostate   Cancer Maternal Aunt 49       ovarian cancer   Breast cancer Cousin     Social History   Socioeconomic History   Marital status: Widowed    Spouse name: Not on file   Number of children: 0   Years of education: Not on file   Highest education level: Not on file  Occupational History   Not on file  Tobacco Use   Smoking status: Never   Smokeless tobacco: Never  Vaping Use   Vaping status: Never Used  Substance and Sexual Activity   Alcohol use: No    Alcohol/week: 0.0 standard drinks of alcohol   Drug use: Never   Sexual activity: Not Currently  Other Topics Concern   Not on file  Social History Narrative   Lives at home alone.   Social Drivers of Corporate Investment Banker Strain: Low Risk  (01/03/2024)   Overall Financial Resource Strain (CARDIA)    Difficulty of Paying Living Expenses: Not  hard at all  Food Insecurity: No Food Insecurity (01/03/2024)   Hunger Vital Sign    Worried About Running Out of Food in the Last Year: Never true    Ran Out of Food in the Last Year: Never true  Transportation Needs: No Transportation Needs (01/03/2024)   PRAPARE - Administrator, Civil Service (Medical): No  Lack of Transportation (Non-Medical): No  Physical Activity: Inactive (01/03/2024)   Exercise Vital Sign    Days of Exercise per Week: 0 days    Minutes of Exercise per Session: 0 min  Stress: No Stress Concern Present (01/03/2024)   [employer] of Occupational Health - Occupational Stress Questionnaire    Feeling of Stress: Not at all  Social Connections: Moderately Integrated (01/03/2024)   Social Connection and Isolation Panel    Frequency of Communication with Friends and Family: More than three times a week    Frequency of Social Gatherings with Friends and Family: Three times a week    Attends Religious Services: More than 4 times per year    Active Member of Clubs or Organizations: Yes    Attends Banker Meetings: More than 4 times per year    Marital Status: Widowed  Intimate Partner Violence: Not At Risk (01/03/2024)   Humiliation, Afraid, Rape, and Kick questionnaire    Fear of Current or Ex-Partner: No    Emotionally Abused: No    Physically Abused: No    Sexually Abused: No    Review of Systems: See HPI, otherwise negative ROS  Physical Exam: BP (!) 164/61   Pulse 66   Temp 98 F (36.7 C) (Temporal)   Resp 12   Ht 5' 3 (1.6 m)   Wt 72.6 kg   SpO2 98%   BMI 28.34 kg/m  General:   Alert, cooperative. Head:  Normocephalic and atraumatic. Respiratory:  Normal work of breathing. Cardiovascular:  NAD  Impression/Plan: ZAYLIE GISLER is here for cataract surgery.  Risks, benefits, limitations, and alternatives regarding cataract surgery have been reviewed with the patient.  Questions have been answered.  All parties  agreeable.   Elsie Carmine, MD  05/05/2024, 12:04 PM

## 2024-05-05 NOTE — Anesthesia Postprocedure Evaluation (Signed)
 Anesthesia Post Note  Patient: [name] [name]  Procedure(s) Performed: PHACOEMULSIFICATION, CATARACT, WITH IOL INSERTION 17.58 01:29.3 (Left: Eye)  Patient location during evaluation: PACU Anesthesia Type: MAC Level of consciousness: awake and alert Pain management: pain level controlled Vital Signs Assessment: post-procedure vital signs reviewed and stable Respiratory status: spontaneous breathing, nonlabored ventilation, respiratory function stable and patient connected to nasal cannula oxygen Cardiovascular status: blood pressure returned to baseline and stable Postop Assessment: no apparent nausea or vomiting Anesthetic complications: no   No notable events documented.   Last Vitals:  Vitals:   05/05/24 1226 05/05/24 1230  BP: (!) 146/62 (!) 142/54  Pulse: 63 63  Resp: 19 (!) 22  Temp: (!) 36.1 C (!) 36.1 C  SpO2: 98% 98%    Last Pain:  Vitals:   05/05/24 1230  TempSrc:   PainSc: 0-No pain                 Kateland Leisinger C Arkie Tagliaferro

## 2024-05-05 NOTE — Transfer of Care (Signed)
 Immediate Anesthesia Transfer of Care Note  Patient: [name] [name]  Procedure(s) Performed: PHACOEMULSIFICATION, CATARACT, WITH IOL INSERTION 17.58 01:29.3 (Left: Eye)  Patient Location: PACU  Anesthesia Type: MAC  Level of Consciousness: awake, alert  and patient cooperative  Airway and Oxygen Therapy: Patient Spontanous Breathing and Patient connected to supplemental oxygen  Post-op Assessment: Post-op Vital signs reviewed, Patient's Cardiovascular Status Stable, Respiratory Function Stable, Patent Airway and No signs of Nausea or vomiting  Post-op Vital Signs: Reviewed and stable  Complications: No notable events documented.

## 2024-05-05 NOTE — Op Note (Signed)
 PREOPERATIVE DIAGNOSIS:  Nuclear sclerotic cataract of the left eye.   POSTOPERATIVE DIAGNOSIS:  Nuclear sclerotic cataract of the left eye.   OPERATIVE PROCEDURE:ORPROCALL@   SURGEON:  Elsie Carmine, MD.   ANESTHESIA:  Anesthesiologist: Ola Donny BROCKS, MD CRNA: Jahoo, Sonia, CRNA  1.      Managed anesthesia care. 2.     0.40ml of Shugarcaine was instilled following the paracentesis   COMPLICATIONS:  None.   TECHNIQUE:   Stop and chop   DESCRIPTION OF PROCEDURE:  The patient was examined and consented in the preoperative holding area where the aforementioned topical anesthesia was applied to the left eye and then brought back to the Operating Room where the left eye was prepped and draped in the usual sterile ophthalmic fashion and a lid speculum was placed. A paracentesis was created with the side port blade and the anterior chamber was filled with viscoelastic. A near clear corneal incision was performed with the steel keratome. A continuous curvilinear capsulorrhexis was performed with a cystotome followed by the capsulorrhexis forceps. Hydrodissection and hydrodelineation were carried out with BSS on a blunt cannula. The lens was removed in a stop and chop  technique and the remaining cortical material was removed with the irrigation-aspiration handpiece. The capsular bag was inflated with viscoelastic and the intraocular lens was placed in the capsular bag without complication. The remaining viscoelastic was removed from the eye with the irrigation-aspiration handpiece. The wounds were hydrated. The anterior chamber was flushed with BSS and the eye was inflated to physiologic pressure. 0.8ml Vigamox  was placed in the anterior chamber. The wounds were found to be water tight. The eye was dressed with Combigan . The patient was given protective glasses to wear throughout the day and a shield with which to sleep tonight. The patient was also given drops with which to begin a drop regimen  today and will follow-up with me in one day. Implant Name Type Inv. Item Serial No. Manufacturer Lot No. LRB No. Used Action  LENS IOL TECNIS EYHANCE 20.0 - [id number] Intraocular Lens LENS IOL TECNIS EYHANCE 20.0 [id number] [employer]  Left 1 Implanted    Procedure(s): PHACOEMULSIFICATION, CATARACT, WITH IOL INSERTION 17.58 01:29.3 (Left)  Electronically signed: Elsie Carmine 05/05/2024 12:25 PM

## 2024-07-09 ENCOUNTER — Other Ambulatory Visit

## 2024-07-10 ENCOUNTER — Other Ambulatory Visit: Payer: Self-pay

## 2024-07-10 DIAGNOSIS — I10 Essential (primary) hypertension: Secondary | ICD-10-CM

## 2024-07-10 DIAGNOSIS — E7800 Pure hypercholesterolemia, unspecified: Secondary | ICD-10-CM

## 2024-07-10 DIAGNOSIS — D649 Anemia, unspecified: Secondary | ICD-10-CM

## 2024-07-10 DIAGNOSIS — R739 Hyperglycemia, unspecified: Secondary | ICD-10-CM

## 2024-07-13 ENCOUNTER — Ambulatory Visit: Admitting: Internal Medicine

## 2024-07-13 ENCOUNTER — Other Ambulatory Visit

## 2024-07-24 ENCOUNTER — Other Ambulatory Visit

## 2024-09-08 ENCOUNTER — Ambulatory Visit: Admitting: Internal Medicine

## 2024-10-09 ENCOUNTER — Ambulatory Visit: Admitting: Oncology

## 2025-01-04 ENCOUNTER — Ambulatory Visit
